# Patient Record
Sex: Female | Born: 1955
Health system: Southern US, Community
[De-identification: ages and names within clinical notes are randomized; demographics above are authoritative.]

## PROBLEM LIST (undated history)

## (undated) DIAGNOSIS — F419 Anxiety disorder, unspecified: Secondary | ICD-10-CM

## (undated) DIAGNOSIS — D649 Anemia, unspecified: Secondary | ICD-10-CM

## (undated) DIAGNOSIS — M1990 Unspecified osteoarthritis, unspecified site: Secondary | ICD-10-CM

## (undated) DIAGNOSIS — F32A Depression, unspecified: Secondary | ICD-10-CM

## (undated) DIAGNOSIS — I10 Essential (primary) hypertension: Secondary | ICD-10-CM

## (undated) DIAGNOSIS — E785 Hyperlipidemia, unspecified: Secondary | ICD-10-CM

## (undated) DIAGNOSIS — F329 Major depressive disorder, single episode, unspecified: Secondary | ICD-10-CM

## (undated) DIAGNOSIS — K635 Polyp of colon: Secondary | ICD-10-CM

## (undated) DIAGNOSIS — E119 Type 2 diabetes mellitus without complications: Secondary | ICD-10-CM

## (undated) HISTORY — DX: Hyperlipidemia, unspecified: E78.5

## (undated) HISTORY — DX: Unspecified osteoarthritis, unspecified site: M19.90

## (undated) HISTORY — DX: Major depressive disorder, single episode, unspecified: F32.9

## (undated) HISTORY — PX: WISDOM TOOTH EXTRACTION: SHX21

## (undated) HISTORY — PX: AUGMENTATION MAMMAPLASTY: SUR837

## (undated) HISTORY — DX: Depression, unspecified: F32.A

## (undated) HISTORY — PX: ABDOMINAL HYSTERECTOMY: SHX81

## (undated) HISTORY — DX: Type 2 diabetes mellitus without complications: E11.9

## (undated) HISTORY — DX: Anxiety disorder, unspecified: F41.9

---
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
## 1998-12-16 ENCOUNTER — Other Ambulatory Visit: Admission: RE | Admit: 1998-12-16 | Discharge: 1998-12-16 | Payer: Self-pay | Admitting: Gynecology

## 1999-04-15 ENCOUNTER — Ambulatory Visit (HOSPITAL_COMMUNITY): Admission: RE | Admit: 1999-04-15 | Discharge: 1999-04-15 | Payer: Self-pay | Admitting: Family Medicine

## 1999-04-26 ENCOUNTER — Ambulatory Visit (HOSPITAL_COMMUNITY): Admission: RE | Admit: 1999-04-26 | Discharge: 1999-04-26 | Payer: Self-pay | Admitting: Gastroenterology

## 1999-05-20 ENCOUNTER — Encounter: Payer: Self-pay | Admitting: Gynecology

## 1999-05-20 ENCOUNTER — Encounter (INDEPENDENT_AMBULATORY_CARE_PROVIDER_SITE_OTHER): Payer: Self-pay

## 1999-05-20 ENCOUNTER — Ambulatory Visit (HOSPITAL_COMMUNITY): Admission: RE | Admit: 1999-05-20 | Discharge: 1999-05-20 | Payer: Self-pay | Admitting: Gynecology

## 1999-12-27 ENCOUNTER — Emergency Department (HOSPITAL_COMMUNITY): Admission: EM | Admit: 1999-12-27 | Discharge: 1999-12-27 | Payer: Self-pay | Admitting: *Deleted

## 2000-01-04 ENCOUNTER — Other Ambulatory Visit: Admission: RE | Admit: 2000-01-04 | Discharge: 2000-01-04 | Payer: Self-pay | Admitting: Gynecology

## 2001-02-14 ENCOUNTER — Other Ambulatory Visit: Admission: RE | Admit: 2001-02-14 | Discharge: 2001-02-14 | Payer: Self-pay | Admitting: Gynecology

## 2001-03-01 ENCOUNTER — Encounter: Admission: RE | Admit: 2001-03-01 | Discharge: 2001-03-01 | Payer: Self-pay | Admitting: Gynecology

## 2001-03-01 ENCOUNTER — Encounter: Payer: Self-pay | Admitting: Gynecology

## 2001-11-22 ENCOUNTER — Encounter: Payer: Self-pay | Admitting: Internal Medicine

## 2001-11-22 ENCOUNTER — Encounter: Admission: RE | Admit: 2001-11-22 | Discharge: 2001-11-22 | Payer: Self-pay | Admitting: Internal Medicine

## 2002-09-27 ENCOUNTER — Encounter: Payer: Self-pay | Admitting: Internal Medicine

## 2002-09-27 ENCOUNTER — Ambulatory Visit (HOSPITAL_COMMUNITY): Admission: RE | Admit: 2002-09-27 | Discharge: 2002-09-27 | Payer: Self-pay | Admitting: Internal Medicine

## 2004-02-27 ENCOUNTER — Encounter: Admission: RE | Admit: 2004-02-27 | Discharge: 2004-02-27 | Payer: Self-pay | Admitting: Gynecology

## 2004-09-17 ENCOUNTER — Ambulatory Visit: Payer: Self-pay | Admitting: Internal Medicine

## 2004-09-24 ENCOUNTER — Ambulatory Visit: Payer: Self-pay | Admitting: Internal Medicine

## 2006-04-21 ENCOUNTER — Ambulatory Visit: Payer: Self-pay | Admitting: *Deleted

## 2006-04-25 ENCOUNTER — Ambulatory Visit: Payer: Self-pay | Admitting: Nurse Practitioner

## 2006-05-17 ENCOUNTER — Other Ambulatory Visit: Admission: RE | Admit: 2006-05-17 | Discharge: 2006-05-17 | Payer: Self-pay | Admitting: Nurse Practitioner

## 2006-05-17 ENCOUNTER — Ambulatory Visit: Payer: Self-pay | Admitting: Nurse Practitioner

## 2006-05-17 ENCOUNTER — Encounter (INDEPENDENT_AMBULATORY_CARE_PROVIDER_SITE_OTHER): Payer: Self-pay | Admitting: *Deleted

## 2006-05-25 ENCOUNTER — Ambulatory Visit: Payer: Self-pay | Admitting: Nurse Practitioner

## 2006-05-29 ENCOUNTER — Ambulatory Visit (HOSPITAL_COMMUNITY): Admission: RE | Admit: 2006-05-29 | Discharge: 2006-05-29 | Payer: Self-pay | Admitting: Family Medicine

## 2006-09-21 ENCOUNTER — Ambulatory Visit: Payer: Self-pay | Admitting: Nurse Practitioner

## 2007-02-16 ENCOUNTER — Ambulatory Visit: Payer: Self-pay | Admitting: Nurse Practitioner

## 2007-02-28 ENCOUNTER — Ambulatory Visit (HOSPITAL_COMMUNITY): Admission: RE | Admit: 2007-02-28 | Discharge: 2007-02-28 | Payer: Self-pay | Admitting: Family Medicine

## 2007-02-28 ENCOUNTER — Ambulatory Visit: Payer: Self-pay | Admitting: Vascular Surgery

## 2007-02-28 ENCOUNTER — Encounter: Payer: Self-pay | Admitting: Vascular Surgery

## 2007-07-11 ENCOUNTER — Encounter (INDEPENDENT_AMBULATORY_CARE_PROVIDER_SITE_OTHER): Payer: Self-pay | Admitting: *Deleted

## 2007-07-26 ENCOUNTER — Encounter (INDEPENDENT_AMBULATORY_CARE_PROVIDER_SITE_OTHER): Payer: Self-pay | Admitting: Nurse Practitioner

## 2007-07-26 ENCOUNTER — Ambulatory Visit: Payer: Self-pay | Admitting: Family Medicine

## 2007-07-26 LAB — CONVERTED CEMR LAB
Albumin: 4.4 g/dL (ref 3.5–5.2)
Alkaline Phosphatase: 47 units/L (ref 39–117)
CO2: 23 meq/L (ref 19–32)
Calcium: 9.3 mg/dL (ref 8.4–10.5)
Chloride: 100 meq/L (ref 96–112)
Eosinophils Absolute: 0.2 10*3/uL (ref 0.0–0.7)
Glucose, Bld: 208 mg/dL — ABNORMAL HIGH (ref 70–99)
Lymphocytes Relative: 44 % (ref 12–46)
Lymphs Abs: 3.4 10*3/uL — ABNORMAL HIGH (ref 0.7–3.3)
Monocytes Relative: 5 % (ref 3–11)
Neutro Abs: 3.7 10*3/uL (ref 1.7–7.7)
Neutrophils Relative %: 48 % (ref 43–77)
Platelets: 251 10*3/uL (ref 150–400)
Potassium: 4.7 meq/L (ref 3.5–5.3)
RBC: 4.42 M/uL (ref 3.87–5.11)
Sodium: 138 meq/L (ref 135–145)
Total Protein: 7.3 g/dL (ref 6.0–8.3)
WBC: 7.7 10*3/uL (ref 4.0–10.5)

## 2007-07-31 ENCOUNTER — Ambulatory Visit (HOSPITAL_COMMUNITY): Admission: RE | Admit: 2007-07-31 | Discharge: 2007-07-31 | Payer: Self-pay | Admitting: Nurse Practitioner

## 2007-08-02 ENCOUNTER — Ambulatory Visit: Payer: Self-pay | Admitting: Internal Medicine

## 2007-08-14 ENCOUNTER — Ambulatory Visit: Payer: Self-pay | Admitting: Internal Medicine

## 2007-10-10 ENCOUNTER — Ambulatory Visit (HOSPITAL_COMMUNITY): Admission: RE | Admit: 2007-10-10 | Discharge: 2007-10-10 | Payer: Self-pay | Admitting: Family Medicine

## 2007-11-29 ENCOUNTER — Ambulatory Visit: Payer: Self-pay | Admitting: Family Medicine

## 2008-01-08 ENCOUNTER — Encounter (INDEPENDENT_AMBULATORY_CARE_PROVIDER_SITE_OTHER): Payer: Self-pay | Admitting: Nurse Practitioner

## 2008-01-08 ENCOUNTER — Ambulatory Visit: Payer: Self-pay | Admitting: Internal Medicine

## 2008-01-08 LAB — CONVERTED CEMR LAB
AST: 13 units/L (ref 0–37)
Alkaline Phosphatase: 41 units/L (ref 39–117)
Glucose, Bld: 114 mg/dL — ABNORMAL HIGH (ref 70–99)
HDL: 38 mg/dL — ABNORMAL LOW (ref >39)
LDL Cholesterol: 158 mg/dL — ABNORMAL HIGH (ref 0–99)
Sodium: 135 meq/L (ref 135–145)
Total Bilirubin: 0.6 mg/dL (ref 0.3–1.2)
Total Protein: 7.8 g/dL (ref 6.0–8.3)
Triglycerides: 151 mg/dL — ABNORMAL HIGH (ref <150)
VLDL: 30 mg/dL (ref 0–40)

## 2008-02-06 ENCOUNTER — Ambulatory Visit: Payer: Self-pay | Admitting: Internal Medicine

## 2008-04-09 ENCOUNTER — Ambulatory Visit (HOSPITAL_COMMUNITY): Admission: RE | Admit: 2008-04-09 | Discharge: 2008-04-09 | Payer: Self-pay | Admitting: Family Medicine

## 2008-11-05 ENCOUNTER — Ambulatory Visit: Payer: Self-pay | Admitting: Emergency Medicine

## 2008-11-05 DIAGNOSIS — R0609 Other forms of dyspnea: Secondary | ICD-10-CM

## 2008-11-05 DIAGNOSIS — I10 Essential (primary) hypertension: Secondary | ICD-10-CM

## 2008-11-05 DIAGNOSIS — R0989 Other specified symptoms and signs involving the circulatory and respiratory systems: Secondary | ICD-10-CM

## 2008-12-03 ENCOUNTER — Ambulatory Visit: Payer: Self-pay | Admitting: Emergency Medicine

## 2009-08-19 ENCOUNTER — Encounter (INDEPENDENT_AMBULATORY_CARE_PROVIDER_SITE_OTHER): Payer: Self-pay | Admitting: *Deleted

## 2010-11-13 ENCOUNTER — Encounter: Payer: Self-pay | Admitting: Gynecology

## 2011-11-22 ENCOUNTER — Encounter: Payer: Self-pay | Admitting: General Practice

## 2012-01-11 DIAGNOSIS — R1013 Epigastric pain: Secondary | ICD-10-CM | POA: Insufficient documentation

## 2012-01-11 DIAGNOSIS — R112 Nausea with vomiting, unspecified: Secondary | ICD-10-CM | POA: Insufficient documentation

## 2012-01-11 DIAGNOSIS — I10 Essential (primary) hypertension: Secondary | ICD-10-CM | POA: Insufficient documentation

## 2012-01-12 ENCOUNTER — Other Ambulatory Visit: Payer: Self-pay

## 2012-01-12 ENCOUNTER — Emergency Department (HOSPITAL_COMMUNITY)
Admission: EM | Admit: 2012-01-12 | Discharge: 2012-01-12 | Disposition: A | Discharge status: Home / Self Care | Payer: Self-pay | Attending: Emergency Medicine | Admitting: Emergency Medicine

## 2012-01-12 ENCOUNTER — Emergency Department (HOSPITAL_COMMUNITY): Payer: Self-pay

## 2012-01-12 ENCOUNTER — Encounter (HOSPITAL_COMMUNITY): Payer: Self-pay | Admitting: Adult Health

## 2012-01-12 HISTORY — DX: Essential (primary) hypertension: I10

## 2012-01-12 LAB — HEPATIC FUNCTION PANEL
ALT: 18 U/L (ref 0–35)
AST: 17 U/L (ref 0–37)
Total Protein: 7.1 g/dL (ref 6.0–8.3)

## 2012-01-12 LAB — CBC
MCHC: 33 g/dL (ref 30.0–36.0)
RDW: 12.8 % (ref 11.5–15.5)

## 2012-01-12 LAB — BASIC METABOLIC PANEL
Chloride: 102 mEq/L (ref 96–112)
GFR calc Af Amer: >90 mL/min (ref >90)
Potassium: 4 mEq/L (ref 3.5–5.1)
Sodium: 139 mEq/L (ref 135–145)

## 2012-01-12 LAB — DIFFERENTIAL
Basophils Absolute: 0 10*3/uL (ref 0.0–0.1)
Basophils Relative: 0 % (ref 0–1)
Neutro Abs: 10.1 10*3/uL — ABNORMAL HIGH (ref 1.7–7.7)
Neutrophils Relative %: 82 % — ABNORMAL HIGH (ref 43–77)

## 2012-01-12 MED ORDER — SODIUM CHLORIDE 0.9 % IV SOLN
Freq: Once | INTRAVENOUS | Status: AC
  Administered 2012-01-12: 04:00:00 via INTRAVENOUS

## 2012-01-12 MED ORDER — ONDANSETRON 8 MG PO TBDP: ORAL_TABLET | ORAL | Status: AC

## 2012-01-12 MED ORDER — MORPHINE SULFATE 4 MG/ML IJ SOLN
4.0000 mg | Freq: Once | INTRAMUSCULAR | Status: AC
  Administered 2012-01-12: 4 mg via INTRAVENOUS
  Filled 2012-01-12: qty 1

## 2012-01-12 MED ORDER — ONDANSETRON HCL 4 MG/2ML IJ SOLN
4.0000 mg | Freq: Once | INTRAMUSCULAR | Status: AC
  Administered 2012-01-12: 4 mg via INTRAVENOUS
  Filled 2012-01-12: qty 2

## 2012-01-12 MED ORDER — HYDROCODONE-ACETAMINOPHEN 5-500 MG PO TABS: 1.0000 | ORAL_TABLET | Freq: Four times a day (QID) | ORAL | Status: AC | PRN

## 2012-01-12 NOTE — ED Notes (Signed)
Patient transported to Ultrasound 

## 2012-01-12 NOTE — ED Provider Notes (Signed)
History     CSN: [id number]  Arrival date & time 01/11/12  2357   First MD Initiated Contact with Patient 01/12/12 0335      Chief Complaint  Patient presents with  . Abdominal Pain    (Consider location/radiation/quality/duration/timing/severity/associated sxs/prior treatment) HPI Comments: Had pancakes and bacon for dinner, shortly thereafter she developed pain in the epigastrium which has worsened.  She has also vomited several times.    Patient is a 56 y.o. female presenting with abdominal pain. The history is provided by the patient.  Abdominal Pain The primary symptoms of the illness include abdominal pain, nausea and vomiting. The primary symptoms of the illness do not include fever, fatigue or diarrhea. The current episode started 3 to 5 hours ago. The problem has been gradually worsening.  The patient states that she believes she is currently not pregnant. The patient has not had a change in bowel habit.    Past Medical History  Diagnosis Date  . Hypertension     History reviewed. No pertinent past surgical history.  History reviewed. No pertinent family history.  History  Substance Use Topics  . Smoking status: Never Smoker   . Smokeless tobacco: Not on file  . Alcohol Use: No    OB History    Grav Para Term Preterm Abortions TAB SAB Ect Mult Living                  Review of Systems  Constitutional: Negative for fever and fatigue.  Gastrointestinal: Positive for nausea, vomiting and abdominal pain. Negative for diarrhea.  All other systems reviewed and are negative.    Allergies  Review of patient's allergies indicates no known allergies.  Home Medications   Current Outpatient Rx  Name Route Sig Dispense Refill  . LISINOPRIL 10 MG PO TABS Oral Take 10 mg by mouth daily.      BP 111/94  Pulse 94  Temp(Src) 98.4 F (36.9 C) (Oral)  Resp 16  SpO2 93%  Physical Exam  Nursing note and vitals reviewed. Constitutional: She is oriented to  person, place, and time. She appears well-developed and well-nourished.  HENT:  Head: Normocephalic and atraumatic.  Neck: Normal range of motion. Neck supple.  Cardiovascular: Normal rate and regular rhythm.   No murmur heard. Pulmonary/Chest: Effort normal and breath sounds normal. No respiratory distress.  Abdominal: Soft. Bowel sounds are normal. She exhibits no distension. There is tenderness. There is no rebound.       There is ttp in the epigastric area.  There is no rebound or guarding.  Bowel sounds are normoactive.  Musculoskeletal: Normal range of motion. She exhibits no edema.  Neurological: She is alert and oriented to person, place, and time.  Skin: Skin is warm and dry. She is not diaphoretic.    ED Course  Procedures (including critical care time)  Labs Reviewed  CBC - Abnormal; Notable for the following:    WBC 12.4 (*)    All other components within normal limits  DIFFERENTIAL - Abnormal; Notable for the following:    Neutrophils Relative 82 (*)    Neutro Abs 10.1 (*)    All other components within normal limits  BASIC METABOLIC PANEL - Abnormal; Notable for the following:    Glucose, Bld 152 (*)    All other components within normal limits   No results found.   No diagnosis found.    MDM  The labs show an elevated wbc of 12k.  Otherwise the labs  look okay.  The [place] does not reveal a cause of her symptoms.  She is feeling better with the meds given.  She will be discharged to home, to return prn if she worsens.        Geoffery Lyons, MD 01/12/12 [phone]

## 2012-01-12 NOTE — ED Notes (Addendum)
C/o epigastric abdominal pain feels like a tightness and associated with nausea began at 2030 this evening

## 2012-01-12 NOTE — Discharge Instructions (Signed)

## 2012-01-12 NOTE — ED Notes (Signed)
Pt back from [place]

## 2012-07-12 ENCOUNTER — Encounter: Payer: Self-pay | Admitting: Internal Medicine

## 2012-10-24 LAB — HM COLONOSCOPY

## 2013-06-12 ENCOUNTER — Other Ambulatory Visit (HOSPITAL_COMMUNITY): Payer: Self-pay | Admitting: Family Medicine

## 2013-06-12 DIAGNOSIS — Z1231 Encounter for screening mammogram for malignant neoplasm of breast: Secondary | ICD-10-CM

## 2013-06-17 ENCOUNTER — Ambulatory Visit (HOSPITAL_COMMUNITY)
Admission: RE | Admit: 2013-06-17 | Discharge: 2013-06-17 | Disposition: A | Discharge status: Home / Self Care | Payer: No Typology Code available for payment source | Source: Ambulatory Visit | Attending: Family Medicine | Admitting: Family Medicine

## 2013-06-17 DIAGNOSIS — Z1231 Encounter for screening mammogram for malignant neoplasm of breast: Secondary | ICD-10-CM | POA: Insufficient documentation

## 2015-03-10 ENCOUNTER — Encounter: Payer: 59 | Attending: Family Medicine

## 2015-03-10 VITALS — Ht 63.0 in | Wt 202.4 lb

## 2015-03-10 DIAGNOSIS — IMO0002 Reserved for concepts with insufficient information to code with codable children: Secondary | ICD-10-CM

## 2015-03-10 DIAGNOSIS — Z713 Dietary counseling and surveillance: Secondary | ICD-10-CM | POA: Insufficient documentation

## 2015-03-10 DIAGNOSIS — E118 Type 2 diabetes mellitus with unspecified complications: Secondary | ICD-10-CM | POA: Insufficient documentation

## 2015-03-10 DIAGNOSIS — E1165 Type 2 diabetes mellitus with hyperglycemia: Secondary | ICD-10-CM

## 2015-03-10 NOTE — Progress Notes (Signed)

## 2015-03-17 DIAGNOSIS — E118 Type 2 diabetes mellitus with unspecified complications: Secondary | ICD-10-CM | POA: Diagnosis not present

## 2015-03-17 DIAGNOSIS — E1165 Type 2 diabetes mellitus with hyperglycemia: Secondary | ICD-10-CM

## 2015-03-17 DIAGNOSIS — IMO0002 Reserved for concepts with insufficient information to code with codable children: Secondary | ICD-10-CM

## 2015-03-17 NOTE — Progress Notes (Signed)

## 2015-03-24 DIAGNOSIS — E118 Type 2 diabetes mellitus with unspecified complications: Principal | ICD-10-CM

## 2015-03-24 DIAGNOSIS — E1165 Type 2 diabetes mellitus with hyperglycemia: Secondary | ICD-10-CM

## 2015-03-24 DIAGNOSIS — IMO0002 Reserved for concepts with insufficient information to code with codable children: Secondary | ICD-10-CM

## 2015-03-24 NOTE — Progress Notes (Signed)
Patient was seen on 03/24/15 for the third of a series of three diabetes self-management courses at the [facility].   Catalina Gravel the amount of activity recommended for healthy living . Describe activities suitable for individual needs . Identify ways to regularly incorporate activity into daily life . Identify barriers to activity and ways to over come these barriers  Identify diabetes medications being personally used and their primary action for lowering glucose and possible side effects . Describe role of stress on blood glucose and develop strategies to address psychosocial issues . Identify diabetes complications and ways to prevent them  Explain how to manage diabetes during illness . Evaluate success in meeting personal goal . Establish 2-3 goals that they will plan to diligently work on until they return for the  44-month follow-up visit  Goals:   I will count my carb choices at most meals and snacks  I will be active  5 times a week  Your patient has identified these potential barriers to change:  Motivation  Your patient has identified their diabetes self-care support plan as  Family Support Plan:  Attend Core 4 in 4 months

## 2015-07-22 ENCOUNTER — Ambulatory Visit: Payer: No Typology Code available for payment source

## 2015-10-22 DIAGNOSIS — Z789 Other specified health status: Secondary | ICD-10-CM | POA: Insufficient documentation

## 2015-10-22 DIAGNOSIS — M170 Bilateral primary osteoarthritis of knee: Secondary | ICD-10-CM | POA: Insufficient documentation

## 2015-10-22 DIAGNOSIS — E1169 Type 2 diabetes mellitus with other specified complication: Secondary | ICD-10-CM | POA: Insufficient documentation

## 2015-10-22 DIAGNOSIS — E782 Mixed hyperlipidemia: Secondary | ICD-10-CM

## 2015-11-23 DIAGNOSIS — E559 Vitamin D deficiency, unspecified: Secondary | ICD-10-CM | POA: Insufficient documentation

## 2016-04-06 ENCOUNTER — Other Ambulatory Visit: Payer: Self-pay | Admitting: Family Medicine

## 2016-04-06 DIAGNOSIS — Z1231 Encounter for screening mammogram for malignant neoplasm of breast: Secondary | ICD-10-CM

## 2016-04-21 ENCOUNTER — Ambulatory Visit: Payer: No Typology Code available for payment source

## 2016-04-25 ENCOUNTER — Ambulatory Visit: Payer: No Typology Code available for payment source

## 2017-03-03 DIAGNOSIS — R600 Localized edema: Secondary | ICD-10-CM | POA: Insufficient documentation

## 2017-03-13 LAB — HM MAMMOGRAPHY

## 2017-09-29 LAB — HEMOGLOBIN A1C: HEMOGLOBIN A1C: 5.8

## 2018-05-17 ENCOUNTER — Encounter: Payer: Self-pay | Admitting: Family Medicine

## 2018-05-18 ENCOUNTER — Encounter: Payer: Self-pay | Admitting: Family Medicine

## 2018-05-18 ENCOUNTER — Other Ambulatory Visit: Payer: Self-pay | Admitting: Emergency Medicine

## 2018-05-18 ENCOUNTER — Ambulatory Visit: Payer: PRIVATE HEALTH INSURANCE | Admitting: Family Medicine

## 2018-05-18 VITALS — BP 122/78 | HR 85 | Temp 99.0°F | Ht 62.5 in | Wt 179.4 lb

## 2018-05-18 DIAGNOSIS — I10 Essential (primary) hypertension: Secondary | ICD-10-CM

## 2018-05-18 DIAGNOSIS — E119 Type 2 diabetes mellitus without complications: Secondary | ICD-10-CM

## 2018-05-18 DIAGNOSIS — Z789 Other specified health status: Secondary | ICD-10-CM | POA: Diagnosis not present

## 2018-05-18 DIAGNOSIS — E1169 Type 2 diabetes mellitus with other specified complication: Secondary | ICD-10-CM | POA: Diagnosis not present

## 2018-05-18 DIAGNOSIS — M791 Myalgia, unspecified site: Secondary | ICD-10-CM

## 2018-05-18 DIAGNOSIS — M170 Bilateral primary osteoarthritis of knee: Secondary | ICD-10-CM

## 2018-05-18 DIAGNOSIS — E782 Mixed hyperlipidemia: Secondary | ICD-10-CM | POA: Diagnosis not present

## 2018-05-18 DIAGNOSIS — Z23 Encounter for immunization: Secondary | ICD-10-CM

## 2018-05-18 LAB — CBC WITH DIFFERENTIAL/PLATELET
BASOS PCT: 0.9 % (ref 0.0–3.0)
Basophils Absolute: 0.1 10*3/uL (ref 0.0–0.1)
EOS PCT: 3.1 % (ref 0.0–5.0)
Eosinophils Absolute: 0.2 10*3/uL (ref 0.0–0.7)
HEMATOCRIT: 40.5 % (ref 36.0–46.0)
HEMOGLOBIN: 13.5 g/dL (ref 12.0–15.0)
Lymphocytes Relative: 35.2 % (ref 12.0–46.0)
Lymphs Abs: 2.4 10*3/uL (ref 0.7–4.0)
MCHC: 33.4 g/dL (ref 30.0–36.0)
MCV: 88.5 fl (ref 78.0–100.0)
MONOS PCT: 5.4 % (ref 3.0–12.0)
Monocytes Absolute: 0.4 10*3/uL (ref 0.1–1.0)
Neutro Abs: 3.7 10*3/uL (ref 1.4–7.7)
Neutrophils Relative %: 55.4 % (ref 43.0–77.0)
Platelets: 275 10*3/uL (ref 150.0–400.0)
RBC: 4.58 Mil/uL (ref 3.87–5.11)
RDW: 13.7 % (ref 11.5–15.5)
WBC: 6.8 10*3/uL (ref 4.0–10.5)

## 2018-05-18 LAB — COMPREHENSIVE METABOLIC PANEL
ALT: 12 U/L (ref 0–35)
AST: 13 U/L (ref 0–37)
Albumin: 4.2 g/dL (ref 3.5–5.2)
Alkaline Phosphatase: 42 U/L (ref 39–117)
BILIRUBIN TOTAL: 0.5 mg/dL (ref 0.2–1.2)
BUN: 11 mg/dL (ref 6–23)
CALCIUM: 9.4 mg/dL (ref 8.4–10.5)
CHLORIDE: 101 meq/L (ref 96–112)
CO2: 28 meq/L (ref 19–32)
CREATININE: 0.67 mg/dL (ref 0.40–1.20)
GFR: 94.11 mL/min (ref >60.00)
Glucose, Bld: 117 mg/dL — ABNORMAL HIGH (ref 70–99)
Potassium: 4.5 mEq/L (ref 3.5–5.1)
SODIUM: 138 meq/L (ref 135–145)
Total Protein: 7.6 g/dL (ref 6.0–8.3)

## 2018-05-18 LAB — POCT GLYCOSYLATED HEMOGLOBIN (HGB A1C): HEMOGLOBIN A1C: 6 % — AB (ref 4.0–5.6)

## 2018-05-18 LAB — LIPID PANEL
CHOLESTEROL: 184 mg/dL (ref 0–200)
HDL: 46.3 mg/dL (ref >39.00)
LDL CALC: 98 mg/dL (ref 0–99)
NONHDL: 137.71
Total CHOL/HDL Ratio: 4
Triglycerides: 199 mg/dL — ABNORMAL HIGH (ref 0.0–149.0)
VLDL: 39.8 mg/dL (ref 0.0–40.0)

## 2018-05-18 LAB — MICROALBUMIN / CREATININE URINE RATIO
CREATININE, U: 57.3 mg/dL
Microalb Creat Ratio: 1.2 mg/g (ref 0.0–30.0)
Microalb, Ur: <0.7 mg/dL (ref 0.0–1.9)

## 2018-05-18 LAB — TSH: TSH: 3.02 u[IU]/mL (ref 0.35–4.50)

## 2018-05-18 LAB — CK: Total CK: 58 U/L (ref 7–177)

## 2018-05-18 MED ORDER — DICLOFENAC SODIUM 75 MG PO TBEC: 75.0000 mg | DELAYED_RELEASE_TABLET | Freq: Two times a day (BID) | ORAL | 2 refills | Status: DC | PRN

## 2018-05-18 NOTE — Progress Notes (Signed)
Subjective  CC:  Chief Complaint  Patient presents with  . Establish Care    Transfer from [place],   . Hyperlipidemia  . Hypertension  . Diabetes    Last A1C 09/29/2017  . Leg Pain    Contstant Leg Pain     HPI: Sarah Walsh is a 62 y.o. female is a former [place] patient and is here to reestablish care with me today.    She has the following concerns or needs:  Hasn't had medical care regularly for about a year.   Has h/o diabetes that resolved with lifestyle changes; now with an occ sugar > 200. Some polyuria. No vision problems. Admits to mild feet paresthesias that are not terrible bothersome.   Has chronic bilateral leg muscle aches and knee pain. Poor insight. Has DJD bilateral knees by exam and xrays 02/2018. C/o ant knee pain after prolonged sitting. Has h/o mild leg swelling. Nl hip xrays. No injuries. Pain is variable. Comes and goes. Was improved on voltaren but she stopped this. Has never used tylenol, glucosamine. Never has had steroid or synvisc injections.   HLD statin intolerant on zetia. Fasting today.   BP: no longer on ACE or HCTZ. No cp or sob.   Assessment  1. Diet-controlled diabetes mellitus ([place])   2. Essential hypertension   3. Combined hyperlipidemia associated with type 2 diabetes mellitus (H[facility])   4. Statin intolerance   5. Myalgia   6. Primary osteoarthritis of both knees   7. Need for shingles vaccine      Plan   DM: diet controlled now. Reassured. Discussed diet. imms up to date. Eye exam up to date: [facility]. Denies retinopathy. Monitor for symptomatic neuropathy. Nl foot exam today. Check urina mac today  BP - normal today w/o meds. Will monitor  HLD on zetia; recheck levels today. Check lfts  Myalgias/OA: suspect most pain is due to knee pain; educated. Start PFS VMO exercises; restart voltaren as needed .trial of glucosamine and tyelnol. Consider injections if can't get controlled.   Follow up:  Return in about 6 weeks (around  06/29/2018) for recheck.  Orders Placed This Encounter  Procedures  . HM MAMMOGRAPHY  . Varicella-zoster vaccine IM (Shingrix)  . Hemoglobin A1c  . Microalbumin / creatinine urine ratio  . Comprehensive metabolic panel  . Lipid panel  . CBC with Differential/Platelet  . TSH  . CK (Creatine Kinase)  . POCT glycosylated hemoglobin (Hb A1C)  . HM COLONOSCOPY   Meds ordered this encounter  Medications  . diclofenac (VOLTAREN) 75 MG EC tablet    Sig: Take 1 tablet (75 mg total) by mouth 2 (two) times daily as needed.    Dispense:  60 tablet    Refill:  2      We updated and reviewed the patient's past history in detail and it is documented below.  Patient Active Problem List   Diagnosis Date Noted  . Diet-controlled diabetes mellitus ([place]) 05/18/2018  . Lower extremity edema 03/03/2017  . Vitamin D deficiency 11/23/2015  . Combined hyperlipidemia associated with type 2 diabetes mellitus ([place]) 10/22/2015  . Primary osteoarthritis of both knees 10/22/2015  . Statin intolerance 10/22/2015  . Essential hypertension 11/05/2008    Qualifier: Diagnosis of  By: Wynetta Emery RN, [facility] Maintenance  Topic Date Due  . Hepatitis C Screening  31-Mar-1956  . FOOT EXAM  02/16/1964  . OPHTHALMOLOGY EXAM  02/16/1964  . URINE MICROALBUMIN  02/16/1964  .  HIV Screening  02/15/1969  . COLONOSCOPY  10/24/2017  . MAMMOGRAM  03/13/2018  . HEMOGLOBIN A1C  03/30/2018  . INFLUENZA VACCINE  05/24/2018  . TETANUS/TDAP  11/22/2020  . PAP SMEAR  Discontinued   Immunization History  Administered Date(s) Administered  . Influenza, Seasonal, Injecte, Preservative Fre 10/22/2015, 07/15/2016  . Influenza,inj,Quad PF,6+ Mos 09/29/2017  . PPD Test 09/21/2016  . Pneumococcal Polysaccharide-23 10/22/2015  . Tdap 11/22/2010  . Zoster 11/23/2015  . Zoster Recombinat (Shingrix) 05/18/2018   Current Meds  Medication Sig  . Ascorbic Acid (VITAMIN C) 1000 MG tablet Take 1,000 mg by mouth daily.  .  Biotin 1000 MCG tablet Take 1,000 mcg by mouth 3 (three) times daily.  Renard Hamper Pepper-Turmeric (TURMERIC CURCUMIN) 02-999 MG CAPS Take by mouth.  . Cholecalciferol (D3-1000) 1000 units tablet Take 1,000 Units by mouth daily.  Marland Kitchen ezetimibe (ZETIA) 10 MG tablet ezetimibe 10 mg tablet  TAKE 1 TABLET EVERY DAY  . magnesium oxide (MAG-OX) 400 MG tablet Take 400 mg by mouth daily.  . Melatonin 5 MG CAPS Take by mouth.  . OMEGA-3 KRILL OIL PO Take by mouth.  . Potassium 99 MG TABS Take by mouth.  . vitamin B-12 (CYANOCOBALAMIN) 500 MCG tablet Take 500 mcg by mouth daily.  . [DISCONTINUED] hydrochlorothiazide (HYDRODIURIL) 25 MG tablet Take by mouth.    Allergies: Patient is allergic to statins. Past Medical History Patient  has a past medical history of Anxiety, Depression, Diabetes mellitus without complication ([place]), Hyperlipemia, and Hypertension. Past Surgical History Patient  has a past surgical history that includes Cesarean section and Abdominal hysterectomy. Family History: Patient family history includes Alzheimer's disease in her mother; Diabetes in her daughter. Social History:  Patient  reports that she has quit smoking. She quit after 34.00 years of use. She has never used smokeless tobacco. She reports that she does not drink alcohol or use drugs.  Review of Systems: Constitutional: negative for fever or malaise Ophthalmic: negative for photophobia, double vision or loss of vision Cardiovascular: negative for chest pain, dyspnea on exertion, or new LE swelling Respiratory: negative for SOB or persistent cough Gastrointestinal: negative for abdominal pain, change in bowel habits or melena Genitourinary: negative for dysuria or gross hematuria Musculoskeletal: negative for new gait disturbance or muscular weakness Integumentary: negative for new or persistent rashes Neurological: negative for TIA or stroke symptoms Psychiatric: negative for SI or delusions Allergic/Immunologic:  negative for hives  Patient Care Team    Relationship Specialty Notifications Start End  Leamon Arnt, MD PCP - General Family Medicine  05/18/18     Objective  Vitals: BP 122/78   Pulse 85   Temp 99 F (37.2 C)   Ht 5' 2.5" (1.588 m)   Wt 179 lb 6.4 oz (81.4 kg)   SpO2 98%   BMI 32.29 kg/m  General:  Well developed, well nourished, no acute distress  Psych:  Alert and oriented,normal mood and affect  Cardiovascular:  RRR without gallop, rub or murmur, nondisplaced PMI Respiratory:  Good breath sounds bilaterally, CTAB with normal respiratory effort UKG:[id number] knees with crepitus. No effusions. Mild ttp Diabetic Foot Exam: Appearance - no lesions, ulcers or calluses Skin - no sigificant pallor or erythema Monofilament testing - sensitive bilaterally in following locations:  Right - Great toe, medial, central, lateral ball and posterior foot intact  Left - Great toe, medial, central, lateral ball and posterior foot intact Pulses - +2 distally bilaterally  Skin:  Warm, no rashes or suspicious  lesions noted Neurologic:    Mental status is normal. Gross motor and sensory exams are normal. Normal gait  Lab Results  Component Value Date   HGBA1C 6.0 (A) 05/18/2018   HGBA1C 5.8 09/29/2017      Commons side effects, risks, benefits, and alternatives for medications and treatment plan prescribed today were discussed, and the patient expressed understanding of the given instructions. Patient is instructed to call or message via MyChart if he/she has any questions or concerns regarding our treatment plan. No barriers to understanding were identified. We discussed Red Flag symptoms and signs in detail. Patient expressed understanding regarding what to do in case of urgent or emergency type symptoms.   Medication list was reconciled, printed and provided to the patient in AVS. Patient instructions and summary information was reviewed with the patient as documented in the AVS. This  note was prepared with assistance of Dragon voice recognition software. Occasional wrong-word or sound-a-like substitutions may have occurred due to the inherent limitations of voice recognition software

## 2018-05-18 NOTE — Patient Instructions (Signed)
Please return in 6 weeks to recheck knee and leg pain  If you have any questions or concerns, please don't hesitate to send me a message via MyChart or call the office at [phone]. Thank you for visiting with [place] today! It's our pleasure caring for you.   Patellofemoral Pain Syndrome Patellofemoral pain syndrome is a condition that involves a softening or breakdown of the tissue (cartilage) on the underside of your kneecap (patella). This causes pain in the front of the knee. The condition is also called runner's knee or chondromalacia patella. Patellofemoral pain syndrome is most common in young adults who are active in sports. Your knee is the largest joint in your body. The patella covers the front of your knee and is attached to muscles above and below your knee. The underside of the patella is covered with a smooth type of cartilage (synovium). The smooth surface helps the patella glide easily when you move your knee. Patellofemoral pain syndrome causes swelling in the joint linings and bone surfaces in your knee. What are the causes? Patellofemoral pain syndrome can be caused by:  Overuse.  Poor alignment of your knee joints.  Weak leg muscles.  A direct blow to your kneecap.  What increases the risk? You may be at risk for patellofemoral pain syndrome if you:  Do a lot of activities that can wear down your kneecap. These include: ? Running. ? Squatting. ? Climbing stairs.  Start a new physical activity or exercise program.  Wear shoes that do not fit well.  Do not have good leg strength.  Are overweight.  What are the signs or symptoms? Knee pain is the most common symptom of patellofemoral pain syndrome. This may feel like a dull, aching pain underneath your patella, in the front of your knee. There may be a popping or cracking sound when you move your knee. Pain may get worse with:  Exercise.  Climbing  stairs.  Running.  Jumping.  Squatting.  Kneeling.  Sitting for a long time.  Moving or pushing on your patella.  How is this diagnosed? Your health care provider may be able to diagnose patellofemoral pain syndrome from your symptoms and medical history. You may be asked about your recent physical activities and which ones cause knee pain. Your health care provider may do a physical exam with certain tests to confirm the diagnosis. These may include:  Moving your patella back and forth.  Checking your range of knee motion.  Having you squat or jump to see if you have pain.  Checking the strength of your leg muscles.  An MRI of the knee may also be done. How is this treated? Patellofemoral pain syndrome can usually be treated at home with rest, ice, compression, and elevation (RICE). Other treatments may include:  Nonsteroidal anti-inflammatory drugs (NSAIDs).  Physical therapy to stretch and strengthen your leg muscles.  Shoe inserts (orthotics) to take stress off your knee.  A knee brace or knee support.  Surgery to remove damaged cartilage or move the patella to a better position. The need for surgery is rare.  Follow these instructions at home:  Take medicines only as directed by your health care provider.  Rest your knee. ? When resting, keep your knee raised above the level of your heart. ? Avoid activities that cause knee pain.  Apply ice to the injured area: ? Put ice in a plastic bag. ? Place a towel between your skin and the bag. ? Leave the ice on  for 20 minutes, 2-3 times a day.  Use splints, braces, knee supports, or walking aids as directed by your health care provider.  Perform stretching and strengthening exercises as directed by your health care provider or physical therapist.  Keep all follow-up visits as directed by your health care provider. This is important. Contact a health care provider if:  Your symptoms get worse.  You are not  improving with home care. This information is not intended to replace advice given to you by your health care provider. Make sure you discuss any questions you have with your health care provider. Document Released: 09/28/2009 Document Revised: 03/17/2016 Document Reviewed: 12/30/2013 Elsevier Interactive Patient Education  2018 [employer].  Osteoarthritis Osteoarthritis is a type of arthritis that affects tissue that covers the ends of bones in joints (cartilage). Cartilage acts as a cushion between the bones and helps them move smoothly. Osteoarthritis results when cartilage in the joints gets worn down. Osteoarthritis is sometimes called "wear and tear" arthritis. Osteoarthritis is the most common form of arthritis. It often occurs in older people. It is a condition that gets worse over time (a progressive condition). Joints that are most often affected by this condition are in:  Fingers.  Toes.  Hips.  Knees.  Spine, including neck and lower back.  What are the causes? This condition is caused by age-related wearing down of cartilage that covers the ends of bones. What increases the risk? The following factors may make you more likely to develop this condition:  Older age.  Being overweight or obese.  Overuse of joints, such as in athletes.  Past injury of a joint.  Past surgery on a joint.  Family history of osteoarthritis.  What are the signs or symptoms? The main symptoms of this condition are pain, swelling, and stiffness in the joint. The joint may lose its shape over time. Small pieces of bone or cartilage may break off and float inside of the joint, which may cause more pain and damage to the joint. Small deposits of bone (osteophytes) may grow on the edges of the joint. Other symptoms may include:  A grating or scraping feeling inside the joint when you move it.  Popping or creaking sounds when you move.  Symptoms may affect one or more joints. Osteoarthritis  in a major joint, such as your knee or hip, can make it painful to walk or exercise. If you have osteoarthritis in your hands, you might not be able to grip items, twist your hand, or control small movements of your hands and fingers (fine motor skills). How is this diagnosed? This condition may be diagnosed based on:  Your medical history.  A physical exam.  Your symptoms.  X-rays of the affected joint(s).  Blood tests to rule out other types of arthritis.  How is this treated? There is no cure for this condition, but treatment can help to control pain and improve joint function. Treatment plans may include:  A prescribed exercise program that allows for rest and joint relief. You may work with a physical therapist.  A weight control plan.  Pain relief techniques, such as: ? Applying heat and cold to the joint. ? Electric pulses delivered to nerve endings under the skin (transcutaneous electrical nerve stimulation, or TENS). ? Massage. ? Certain nutritional supplements.  NSAIDs or prescription medicines to help relieve pain.  Medicine to help relieve pain and inflammation (corticosteroids). This can be given by mouth (orally) or as an injection.  Assistive devices,  such as a brace, wrap, splint, specialized glove, or cane.  Surgery, such as: ? An osteotomy. This is done to reposition the bones and relieve pain or to remove loose pieces of bone and cartilage. ? Joint replacement surgery. You may need this surgery if you have very bad (advanced) osteoarthritis.  Follow these instructions at home: Activity  Rest your affected joints as directed by your health care provider.  Do not drive or use heavy machinery while taking prescription pain medicine.  Exercise as directed. Your health care provider or physical therapist may recommend specific types of exercise, such as: ? Strengthening exercises. These are done to strengthen the muscles that support joints that are affected  by arthritis. They can be performed with weights or with exercise bands to add resistance. ? Aerobic activities. These are exercises, such as brisk walking or water aerobics, that get your heart pumping. ? Range-of-motion activities. These keep your joints easy to move. ? Balance and agility exercises. Managing pain, stiffness, and swelling  If directed, apply heat to the affected area as often as told by your health care provider. Use the heat source that your health care provider recommends, such as a moist heat pack or a heating pad. ? If you have a removable assistive device, remove it as told by your health care provider. ? Place a towel between your skin and the heat source. If your health care provider tells you to keep the assistive device on while you apply heat, place a towel between the assistive device and the heat source. ? Leave the heat on for 20-30 minutes. ? Remove the heat if your skin turns bright red. This is especially important if you are unable to feel pain, heat, or cold. You may have a greater risk of getting burned.  If directed, put ice on the affected joint: ? If you have a removable assistive device, remove it as told by your health care provider. ? Put ice in a plastic bag. ? Place a towel between your skin and the bag. If your health care provider tells you to keep the assistive device on during icing, place a towel between the assistive device and the bag. ? Leave the ice on for 20 minutes, 2-3 times a day. General instructions  Take over-the-counter and prescription medicines only as told by your health care provider.  Maintain a healthy weight. Follow instructions from your health care provider for weight control. These may include dietary restrictions.  Do not use any products that contain nicotine or tobacco, such as cigarettes and e-cigarettes. These can delay bone healing. If you need help quitting, ask your health care provider.  Use assistive devices  as directed by your health care provider.  Keep all follow-up visits as told by your health care provider. This is important. Where to find more information:  [employer] of Arthritis and Musculoskeletal and Skin Diseases: www.niams.SouthExposed.es  [employer] on Aging: http://kim-miller.com/  American College of Rheumatology: www.rheumatology.org Contact a health care provider if:  Your skin turns red.  You develop a rash.  You have pain that gets worse.  You have a fever along with joint or muscle aches. Get help right away if:  You lose a lot of weight.  You suddenly lose your appetite.  You have night sweats. Summary  Osteoarthritis is a type of arthritis that affects tissue covering the ends of bones in joints (cartilage).  This condition is caused by age-related wearing down of cartilage that covers the  ends of bones.  The main symptom of this condition is pain, swelling, and stiffness in the joint.  There is no cure for this condition, but treatment can help to control pain and improve joint function. This information is not intended to replace advice given to you by your health care provider. Make sure you discuss any questions you have with your health care provider. Document Released: 10/10/2005 Document Revised: 06/13/2016 Document Reviewed: 06/13/2016 Elsevier Interactive Patient Education  [employer].

## 2018-05-18 NOTE — Progress Notes (Signed)
Please call patient: I have reviewed his/her lab results. All lab test results look great. Cholesterol is at goal so continue zetia 10mg  daily. Can refill if needed. No other medications are needed at this time.

## 2018-05-21 ENCOUNTER — Telehealth: Payer: Self-pay | Admitting: Emergency Medicine

## 2018-05-21 NOTE — Telephone Encounter (Signed)
Copied from [place] [id number]. Topic: Quick Communication - Lab Results >> May 21, 2018  8:37 AM Sigurd Sos, LPN wrote: Called patient to inform them of 05/21/2018 lab results. When patient returns call, triage nurse may disclose results. >> May 21, 2018  9:57 AM Keene Breath wrote: Patient is returning call regarding labs.  NT was not available.  CB# [phone].   Returned patient's phone call. Reported Lab results, Patient verbalized understanding.  Doloris Hall,  LPN

## 2018-05-29 ENCOUNTER — Telehealth: Payer: Self-pay | Admitting: Family Medicine

## 2018-05-29 MED ORDER — EZETIMIBE 10 MG PO TABS: ORAL_TABLET | ORAL | 3 refills | Status: DC

## 2018-05-29 NOTE — Telephone Encounter (Signed)
Rx sent to [facility].   Doloris Hall,  LPN

## 2018-05-29 NOTE — Telephone Encounter (Signed)
Copied from [place] [id number]. Topic: Quick Communication - Rx Refill/Question >> May 29, 2018 11:25 AM Keene Breath wrote: Medication: ezetimibe (ZETIA) 10 MG tablet  Patient called to request a refill for the above medication.  CB# [phone]  Preferred Pharmacy (with phone number or street name): [facility] # [id number] [place] [phone] (Phone) [phone] (Fax)

## 2018-05-29 NOTE — Telephone Encounter (Signed)
Ezetimibe (Zetia) 10mg  tab refill Last Refilled by historical provider Last OV: 05/18/18 PCP: Dr. Jonni Sanger Pharmacy:[facility]

## 2018-06-11 ENCOUNTER — Telehealth: Payer: Self-pay | Admitting: Family Medicine

## 2018-06-11 NOTE — Telephone Encounter (Signed)
She will need an appointment either with [place] for assessment or to see her dentist. We need to figure out the exact problem so we know how to best treat it.

## 2018-06-11 NOTE — Telephone Encounter (Signed)
Copied from [place] [id number]. Topic: Quick Communication - See Telephone Encounter >> Jun 11, 2018 12:12 PM Mylinda Latina, NT wrote: CRM for notification. See Telephone encounter for: 06/11/18. Patient called and states she would like a medication refilled. She has a tooth ached and does not want to go to the dentist. Patient does not know the name of the medication. She states Dr. Jonni Sanger prescribed this when she was still at [facility]. Please call patient CB# [facility] [address] [place] - [address] [phone] (Phone) [phone] (Fax)

## 2018-06-11 NOTE — Telephone Encounter (Signed)
Spoke with Patient and she states that she is having Jaw Pain from a tooth ache. She states that Dr. Haze Justin her an Antibiotic a while back (when she was at [facility]) and it cleared it up. I advised patient that Dr. Jonni Sanger is out of town, Does the patient need to have and OV or can we call something in.   Please advise.   Doloris Hall,  LPN

## 2018-06-11 NOTE — Telephone Encounter (Signed)
Spoke with Patient, advised her that she would need to be seen for evaluation, I advised patient and she scheduled an appointment for 06/12/2018   Doloris Hall,  LPN

## 2018-06-12 ENCOUNTER — Ambulatory Visit: Payer: PRIVATE HEALTH INSURANCE | Admitting: Physician Assistant

## 2018-06-12 ENCOUNTER — Encounter: Payer: Self-pay | Admitting: Physician Assistant

## 2018-06-12 ENCOUNTER — Other Ambulatory Visit: Payer: Self-pay

## 2018-06-12 VITALS — BP 110/80 | HR 81 | Temp 98.2°F | Resp 14 | Ht 63.0 in | Wt 183.0 lb

## 2018-06-12 DIAGNOSIS — K047 Periapical abscess without sinus: Secondary | ICD-10-CM

## 2018-06-12 MED ORDER — AMOXICILLIN 875 MG PO TABS: 875.0000 mg | ORAL_TABLET | Freq: Two times a day (BID) | ORAL | 0 refills | Status: DC

## 2018-06-12 NOTE — Patient Instructions (Signed)
Please stay hydrated and get plenty of rest. Take the antibiotic as directed with food. Avoid harder foods. Chew on the other side when possible. Get some OTC Orajel to apply to the area.   Follow-up with PCP or Dentist if symptoms are not resolving.

## 2018-06-12 NOTE — Progress Notes (Signed)
Patient presents to clinic today c/o dental pain x 4 days. Notes she does have cavities in the area. Denies fever, chills. Denies any trauma or injury. Has history of dental infection. Is having some mild headache 2/2 dental pain. Denies ear pain or URI symptoms.   Past Medical History:  Diagnosis Date  . Anxiety   . Depression   . Diabetes mellitus without complication ([place])   . Hyperlipemia   . Hypertension     Current Outpatient Medications on File Prior to Visit  Medication Sig Dispense Refill  . Ascorbic Acid (VITAMIN C) 1000 MG tablet Take 1,000 mg by mouth daily.    . Biotin 1000 MCG tablet Take 1,000 mcg by mouth 3 (three) times daily.    Renard Hamper Pepper-Turmeric (TURMERIC CURCUMIN) 02-999 MG CAPS Take by mouth.    . Cholecalciferol (D3-1000) 1000 units tablet Take 1,000 Units by mouth daily.    . diclofenac (VOLTAREN) 75 MG EC tablet Take 1 tablet (75 mg total) by mouth 2 (two) times daily as needed. 60 tablet 2  . ezetimibe (ZETIA) 10 MG tablet ezetimibe 10 mg tablet  TAKE 1 TABLET EVERY DAY 90 tablet 3  . magnesium oxide (MAG-OX) 400 MG tablet Take 400 mg by mouth daily.    . Melatonin 5 MG CAPS Take by mouth.    . OMEGA-3 KRILL OIL PO Take by mouth.    . Potassium 99 MG TABS Take by mouth.    . vitamin B-12 (CYANOCOBALAMIN) 500 MCG tablet Take 500 mcg by mouth daily.     No current facility-administered medications on file prior to visit.     Allergies  Allergen Reactions  . Statins Other (See Comments)    myalgia    Family History  Problem Relation Age of Onset  . Alzheimer's disease Mother   . Diabetes Daughter     Social History   Socioeconomic History  . Marital status: Single    Spouse name: Not on file  . Number of children: Not on file  . Years of education: Not on file  . Highest education level: Not on file  Occupational History  . Not on file  Social Needs  . Financial resource strain: Not on file  . Food insecurity:    Worry: Not on file     Inability: Not on file  . Transportation needs:    Medical: Not on file    Non-medical: Not on file  Tobacco Use  . Smoking status: Former Smoker    Years: 34.00  . Smokeless tobacco: Never Used  Substance and Sexual Activity  . Alcohol use: No  . Drug use: No  . Sexual activity: Not on file  Lifestyle  . Physical activity:    Days per week: Not on file    Minutes per session: Not on file  . Stress: Not on file  Relationships  . Social connections:    Talks on phone: Not on file    Gets together: Not on file    Attends religious service: Not on file    Active member of club or organization: Not on file    Attends meetings of clubs or organizations: Not on file    Relationship status: Not on file  Other Topics Concern  . Not on file  Social History Narrative  . Not on file   Review of Systems - See HPI.  All other ROS are negative.  BP 110/80   Pulse 81   Temp 98.2  F (36.8 C) (Oral)   Resp 14   Ht 5\' 3"  (1.6 m)   Wt 183 lb (83 kg)   SpO2 98%   BMI 32.42 kg/m   Physical Exam  Constitutional: She is oriented to person, place, and time. She appears well-developed and well-nourished.  HENT:  Head: Normocephalic and atraumatic.  Right Ear: External ear normal.  Mouth/Throat:    Eyes: Conjunctivae and EOM are normal.  Neck: Neck supple.  Cardiovascular: Normal rate, regular rhythm, normal heart sounds and intact distal pulses.  Pulmonary/Chest: Effort normal and breath sounds normal. No stridor. No respiratory distress. She has no wheezes. She has no rales. She exhibits no tenderness.  Neurological: She is alert and oriented to person, place, and time.  Psychiatric: She has a normal mood and affect.  Vitals reviewed.   Recent Results (from the past 2160 hour(s))  Microalbumin / creatinine urine ratio     Status: None   Collection Time: 05/18/18 10:49 AM  Result Value Ref Range   Microalb, Ur <0.7 0.0 - 1.9 mg/dL   Creatinine,U 57.3 mg/dL   Microalb  Creat Ratio 1.2 0.0 - 30.0 mg/g  Comprehensive metabolic panel     Status: Abnormal   Collection Time: 05/18/18 10:49 AM  Result Value Ref Range   Sodium 138 135 - 145 mEq/L   Potassium 4.5 3.5 - 5.1 mEq/L   Chloride 101 96 - 112 mEq/L   CO2 28 19 - 32 mEq/L   Glucose, Bld 117 (H) 70 - 99 mg/dL   BUN 11 6 - 23 mg/dL   Creatinine, Ser 0.67 0.40 - 1.20 mg/dL   Total Bilirubin 0.5 0.2 - 1.2 mg/dL   Alkaline Phosphatase 42 39 - 117 U/L   AST 13 0 - 37 U/L   ALT 12 0 - 35 U/L   Total Protein 7.6 6.0 - 8.3 g/dL   Albumin 4.2 3.5 - 5.2 g/dL   Calcium 9.4 8.4 - 10.5 mg/dL   GFR 94.11 >60.00 mL/min  Lipid panel     Status: Abnormal   Collection Time: 05/18/18 10:49 AM  Result Value Ref Range   Cholesterol 184 0 - 200 mg/dL    Comment: ATP III Classification       Desirable:  < 200 mg/dL               Borderline High:  200 - 239 mg/dL          High:  > = 240 mg/dL   Triglycerides 199.0 (H) 0.0 - 149.0 mg/dL    Comment: Normal:  <150 mg/dLBorderline High:  150 - 199 mg/dL   HDL 46.30 >39.00 mg/dL   VLDL 39.8 0.0 - 40.0 mg/dL   LDL Cholesterol 98 0 - 99 mg/dL   Total CHOL/HDL Ratio 4     Comment:                Men          Women1/2 Average Risk     3.4          3.3Average Risk          5.0          4.42X Average Risk          9.6          7.13X Average Risk          15.0          11.0  NonHDL 137.71     Comment: NOTE:  Non-HDL goal should be 30 mg/dL higher than patient's LDL goal (i.e. LDL goal of < 70 mg/dL, would have non-HDL goal of < 100 mg/dL)  CBC with Differential/Platelet     Status: None   Collection Time: 05/18/18 10:49 AM  Result Value Ref Range   WBC 6.8 4.0 - 10.5 K/uL   RBC 4.58 3.87 - 5.11 Mil/uL   Hemoglobin 13.5 12.0 - 15.0 g/dL   HCT 40.5 36.0 - 46.0 %   MCV 88.5 78.0 - 100.0 fl   MCHC 33.4 30.0 - 36.0 g/dL   RDW 13.7 11.5 - 15.5 %   Platelets 275.0 150.0 - 400.0 K/uL   Neutrophils Relative % 55.4 43.0 - 77.0 %   Lymphocytes Relative 35.2 12.0  - 46.0 %   Monocytes Relative 5.4 3.0 - 12.0 %   Eosinophils Relative 3.1 0.0 - 5.0 %   Basophils Relative 0.9 0.0 - 3.0 %   Neutro Abs 3.7 1.4 - 7.7 K/uL   Lymphs Abs 2.4 0.7 - 4.0 K/uL   Monocytes Absolute 0.4 0.1 - 1.0 K/uL   Eosinophils Absolute 0.2 0.0 - 0.7 K/uL   Basophils Absolute 0.1 0.0 - 0.1 K/uL  TSH     Status: None   Collection Time: 05/18/18 10:49 AM  Result Value Ref Range   TSH 3.02 0.35 - 4.50 uIU/mL  CK (Creatine Kinase)     Status: None   Collection Time: 05/18/18 10:49 AM  Result Value Ref Range   Total CK 58 7 - 177 U/L  POCT glycosylated hemoglobin (Hb A1C)     Status: Abnormal   Collection Time: 05/18/18 11:05 AM  Result Value Ref Range   Hemoglobin A1C 6.0 (A) 4.0 - 5.6 %   HbA1c POC (<> result, manual entry)  4.0 - 5.6 %   HbA1c, POC (prediabetic range)  5.7 - 6.4 %   HbA1c, POC (controlled diabetic range)  0.0 - 7.0 %   Assessment/Plan: 1. Dental infection Mild. Afebrile. No active fluctuance noted on examination. Will start Amoxicillin. Supportive measures and pain relief reviewed. Follow-up with dentist.   - amoxicillin (AMOXIL) 875 MG tablet; Take 1 tablet (875 mg total) by mouth 2 (two) times daily.  Dispense: 14 tablet; Refill: 0   Leeanne Rio, PA-C

## 2018-07-04 ENCOUNTER — Ambulatory Visit: Payer: PRIVATE HEALTH INSURANCE | Admitting: Family Medicine

## 2018-07-04 ENCOUNTER — Encounter: Payer: Self-pay | Admitting: Family Medicine

## 2018-07-04 ENCOUNTER — Other Ambulatory Visit: Payer: Self-pay

## 2018-07-04 VITALS — BP 124/86 | HR 80 | Temp 98.6°F | Ht 63.0 in | Wt 187.0 lb

## 2018-07-04 DIAGNOSIS — R600 Localized edema: Secondary | ICD-10-CM | POA: Diagnosis not present

## 2018-07-04 DIAGNOSIS — M222X2 Patellofemoral disorders, left knee: Secondary | ICD-10-CM

## 2018-07-04 DIAGNOSIS — Z23 Encounter for immunization: Secondary | ICD-10-CM | POA: Diagnosis not present

## 2018-07-04 DIAGNOSIS — M170 Bilateral primary osteoarthritis of knee: Secondary | ICD-10-CM

## 2018-07-04 DIAGNOSIS — M222X1 Patellofemoral disorders, right knee: Secondary | ICD-10-CM | POA: Insufficient documentation

## 2018-07-04 MED ORDER — TRIAMCINOLONE ACETONIDE 40 MG/ML IJ SUSP
40.0000 mg | Freq: Once | INTRAMUSCULAR | Status: AC
  Administered 2018-07-04: 40 mg via INTRA_ARTICULAR

## 2018-07-04 MED ORDER — HYDROCHLOROTHIAZIDE 12.5 MG PO CAPS: 12.5000 mg | ORAL_CAPSULE | Freq: Every day | ORAL | 3 refills | Status: DC | PRN

## 2018-07-04 NOTE — Patient Instructions (Signed)
Please return in 2-3 weeks if you'd like another steroid injection into your left knee and/or a skin biopsy. (would need 30 minute visit to do both at same time)  If you have any questions or concerns, please don't hesitate to send me a message via MyChart or call the office at [phone]. Thank you for visiting with [place] today! It's our pleasure caring for you.  Knee Injection, Care After Refer to this sheet in the next few weeks. These instructions provide you with information about caring for yourself after your procedure. Your health care provider may also give you more specific instructions. Your treatment has been planned according to current medical practices, but problems sometimes occur. Call your health care provider if you have any problems or questions after your procedure. What can I expect after the procedure? After the procedure, it is common to have:  Soreness.  Warmth.  Swelling.  You may have more pain, swelling, and warmth than you did before the injection. This reaction may last for about one day. Follow these instructions at home: Bathing  If you were given a bandage (dressing), keep it dry until your health care provider says it can be removed. Ask your health care provider when you can start showering or taking a bath. Managing pain, stiffness, and swelling  If directed, apply ice to the injection area: ? Put ice in a plastic bag. ? Place a towel between your skin and the bag. ? Leave the ice on for 20 minutes, 2-3 times per day.  Do not apply heat to your knee.  Raise the injection area above the level of your heart while you are sitting or lying down. Activity  Avoid strenuous activities for as long as directed by your health care provider. Ask your health care provider when you can return to your normal activities. General instructions  Take medicines only as directed by your health care provider.  Do not take aspirin or other over-the-counter medicines  unless your health care provider says you can.  Check your injection site every day for signs of infection. Watch for: ? Redness, swelling, or pain. ? Fluid, blood, or pus.  Follow your health care provider's instructions about dressing changes and removal. Contact a health care provider if:  You have symptoms at your injection site that last longer than two days after your procedure.  You have redness, swelling, or pain in your injection area.  You have fluid, blood, or pus coming from your injection site.  You have warmth in your injection area.  You have a fever.  Your pain is not controlled with medicine. Get help right away if:  Your knee turns very red.  Your knee becomes very swollen.  Your knee pain is severe. This information is not intended to replace advice given to you by your health care provider. Make sure you discuss any questions you have with your health care provider. Document Released: 10/31/2014 Document Revised: 06/15/2016 Document Reviewed: 08/20/2014 Elsevier Interactive Patient Education  [employer].

## 2018-07-04 NOTE — Progress Notes (Signed)
Subjective  CC:  Chief Complaint  Patient presents with  . Knee Pain    recheck on Knee Pain, Patient states inflammation is better but still has pain   . Immunizations    requests flu shot today     HPI: Sarah Walsh is a 62 y.o. female who presents to the office today to address the problems listed above in the chief complaint.  She has OA of the  Bilateral knee(s) and is having pain that impedes her quality of life and activity level. See past notes for further documentation.she is here for f/u: now on voltaren bid and this is helping although still with daily pain. No new swelling or sxs. Hasn't done vmo exercises for PFS regularly.  No contraindications to steroids are identified. She is a diet controlled diabetic. We have used alternative measures for pain control including tylenol, nsaids, ice, rest, with variable success.   H/o HTN and current lower ext edema. Requesting hctz refill. 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 On her feet most of the day. No calf pain.   BP Readings from Last 3 Encounters:  07/04/18 124/86  06/12/18 110/80  05/18/18 122/78    I reviewed the patients updated PMH, FH, and SocHx.    Patient Active Problem List   Diagnosis Date Noted  . Patellofemoral pain syndrome of both knees 07/04/2018  . Diet-controlled diabetes mellitus ([place]) 05/18/2018  . Lower extremity edema 03/03/2017  . Vitamin D deficiency 11/23/2015  . Combined hyperlipidemia associated with type 2 diabetes mellitus ([place]) 10/22/2015  . Primary osteoarthritis of both knees 10/22/2015  . Statin intolerance 10/22/2015  . Essential hypertension 11/05/2008   Current Meds  Medication Sig  . Ascorbic Acid (VITAMIN C) 1000 MG tablet Take 1,000 mg by mouth daily.  . Biotin 1000 MCG tablet Take 1,000 mcg by mouth 3 (three) times daily.  . Cholecalciferol (D3-1000) 1000 units tablet Take 1,000 Units by mouth daily.  . diclofenac (VOLTAREN) 75 MG  EC tablet Take 1 tablet (75 mg total) by mouth 2 (two) times daily as needed.  . ezetimibe (ZETIA) 10 MG tablet ezetimibe 10 mg tablet  TAKE 1 TABLET EVERY DAY  . magnesium oxide (MAG-OX) 400 MG tablet Take 400 mg by mouth daily.  . Melatonin 5 MG CAPS Take by mouth.  . OMEGA-3 KRILL OIL PO Take by mouth.  . Potassium 99 MG TABS Take by mouth.  . vitamin B-12 (CYANOCOBALAMIN) 500 MCG tablet Take 500 mcg by mouth daily.  . [DISCONTINUED] Black Pepper-Turmeric (TURMERIC CURCUMIN) 02-999 MG CAPS Take by mouth.    Allergies: Patient is allergic to statins.  Review of Systems: Constitutional: Negative for fever malaise or anorexia Cardiovascular: negative for chest pain Respiratory: negative for SOB or persistent cough Gastrointestinal: negative for abdominal pain  Objective  Vitals: BP 124/86   Pulse 80   Temp 98.6 F (37 C)   Ht 5\' 3"  (1.6 m)   Wt 187 lb (84.8 kg)   SpO2 98%   BMI 33.13 kg/m  General: no acute distress , A&Ox3 Cardiovascular:  RRR without murmur  Knee:  Left no collateral ligament laxity, positive patella grind test and no effusion, + crepitus, no warmth or redness, full ROM LE: tr edema bilaterally Skin:  Warm, no rashes Lab Results  Component Value Date   CREATININE 0.67 05/18/2018   BUN 11 05/18/2018   NA 138 05/18/2018   K 4.5 05/18/2018  CL 101 05/18/2018   CO2 28 05/18/2018    Procedure note for Knee Joint Aspiration and/or Injection:  Indications: pain relief, failed conservative therapies  Knee Arthrocentesis with Injection Procedure Note  Pre-operative Diagnosis: left knee OA with pain  Post-operative Diagnosis: same  Indications: Symptom relief from osteoarthritis  Anesthesia: Lidocaine 1% without epinephrine without added sodium bicarbonate  Procedure Details   Verbal consent was obtained for the procedure. Universal time out taken.  The Knee joint was prepped with alcohol and an 25 gauge needle was inserted into the joint from the  medial approach.  Four ml 1% lidocaine and one ml of triamcinolone (KENALOG) 40mg /ml was then injected into the joint through the same needle. The needle was removed and the area cleansed and dressed.  Complications:  None; patient tolerated the procedure well.   Assessment  1. Primary osteoarthritis of both knees   2. Lower extremity edema   3. Patellofemoral pain syndrome of both knees      Plan   S/p intra-articular knee joint steroid injection:  Routine post procedure care discussed. Rest, Ice, Nsaids as needed and ROM exercises recommended. F/u care printed out for patient in AVS. Can use tylenol. Hopefully will not need the regular voltaren dosing now. Can return for right steroid injection if helpful. Will also consider trial of synvisc. rec vmo exercises again.   HCTZ mini dose for mild edema. bp is stable.   Discussed urgent f/u if develops increased pain, redness or fever.   Flu shot today.   Follow up: steroid injection, knee right if needed.     Commons side effects, risks, benefits, and alternatives for medications and treatment plan prescribed today were discussed, and the patient expressed understanding of the given instructions. Patient is instructed to call or message via MyChart if he/she has any questions or concerns regarding our treatment plan. No barriers to understanding were identified. We discussed Red Flag symptoms and signs in detail. Patient expressed understanding regarding what to do in case of urgent or emergency type symptoms.   Medication list was reconciled, printed and provided to the patient in AVS. Patient instructions and summary information was reviewed with the patient as documented in the AVS. This note was prepared with assistance of Dragon voice recognition software. Occasional wrong-word or sound-a-like substitutions may have occurred due to the inherent limitations of voice recognition software  No orders of the defined types were placed in  this encounter.  Meds ordered this encounter  Medications  . hydrochlorothiazide (MICROZIDE) 12.5 MG capsule    Sig: Take 1 capsule (12.5 mg total) by mouth daily as needed.    Dispense:  90 capsule    Refill:  3

## 2018-08-19 ENCOUNTER — Other Ambulatory Visit: Payer: Self-pay | Admitting: Family Medicine

## 2018-09-12 ENCOUNTER — Encounter: Payer: Self-pay | Admitting: Family Medicine

## 2018-09-12 ENCOUNTER — Other Ambulatory Visit: Payer: Self-pay

## 2018-09-12 ENCOUNTER — Ambulatory Visit: Payer: PRIVATE HEALTH INSURANCE | Admitting: Family Medicine

## 2018-09-12 VITALS — BP 118/84 | HR 91 | Temp 98.3°F | Ht 63.0 in | Wt 187.0 lb

## 2018-09-12 DIAGNOSIS — K0510 Chronic gingivitis, plaque induced: Secondary | ICD-10-CM | POA: Diagnosis not present

## 2018-09-12 DIAGNOSIS — Z1239 Encounter for other screening for malignant neoplasm of breast: Secondary | ICD-10-CM

## 2018-09-12 MED ORDER — AMOXICILLIN 875 MG PO TABS: 875.0000 mg | ORAL_TABLET | Freq: Two times a day (BID) | ORAL | 0 refills | Status: AC

## 2018-09-12 NOTE — Progress Notes (Signed)
Subjective  CC:  Chief Complaint  Patient presents with  . Dental Pain    hurts in jaw x since this past weekend    HPI: Sarah Walsh is a 62 y.o. female who presents to the office today to address the problems listed above in the chief complaint.  62 year old with well-controlled diabetes presents with left lower jaw swelling and soreness.  Has had some dental problems over the last year or so.  Trying to hold off going to the dentist until April of next year when she gets dental coverage.  She denies severe pain.  Has minimal pain with chewing or eating.  Symptoms of been ongoing for 2 to 3 days only.  No fevers or chills.  Had similar problem in August and treated with amoxicillin with good results.  Health maintenance: Patient due for complete physical and multiple screening test.  She would like to defer at this time due to her job.  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  It is hard for her to get away for appointments at this time. Assessment  1. Gingivitis   2. Breast cancer screening      Plan   Gingivitis versus early dental abscess, mild: Treat with oral antibiotics and Advil.  Follow-up if not improving.  Defer x-ray imaging per patient's request at this time.  Health maintenance: Set up mammography.  Defer physical until January.  Discussed need for colonoscopy, diabetic eye exam.  Follow up: Return in about 8 weeks (around 11/07/2018) for complete physical, follow up Diabetes.   Orders Placed This Encounter  Procedures  . MM DIGITAL SCREENING BILATERAL   Meds ordered this encounter  Medications  . amoxicillin (AMOXIL) 875 MG tablet    Sig: Take 1 tablet (875 mg total) by mouth 2 (two) times daily for 7 days.    Dispense:  14 tablet    Refill:  0      I reviewed the patients updated PMH, FH, and SocHx.    Patient Active Problem List   Diagnosis Date Noted  . Patellofemoral pain syndrome of both knees 07/04/2018  . Diet-controlled  diabetes mellitus ([place]) 05/18/2018  . Lower extremity edema 03/03/2017  . Vitamin D deficiency 11/23/2015  . Combined hyperlipidemia associated with type 2 diabetes mellitus ([place]) 10/22/2015  . Primary osteoarthritis of both knees 10/22/2015  . Statin intolerance 10/22/2015  . Essential hypertension 11/05/2008   Current Meds  Medication Sig  . Ascorbic Acid (VITAMIN C) 1000 MG tablet Take 1,000 mg by mouth daily.  . Biotin 1000 MCG tablet Take 1,000 mcg by mouth 3 (three) times daily.  . Cholecalciferol (D3-1000) 1000 units tablet Take 1,000 Units by mouth daily.  . diclofenac (VOLTAREN) 75 MG EC tablet TAKE 1 TABLET BY MOUTH TWICE DAILY AS NEEDED  . magnesium oxide (MAG-OX) 400 MG tablet Take 400 mg by mouth daily.  . Melatonin 5 MG CAPS Take by mouth.  . OMEGA-3 KRILL OIL PO Take by mouth.  . Potassium 99 MG TABS Take by mouth.  . vitamin B-12 (CYANOCOBALAMIN) 500 MCG tablet Take 500 mcg by mouth daily.    Allergies: Patient is allergic to statins. Family History: Patient family history includes Alzheimer's disease in her mother; Diabetes in her daughter. Social History:  Patient  reports that she has quit smoking. She quit after 34.00 years of use. She has never used smokeless tobacco. She reports that she does not drink alcohol or use drugs.  Review  of Systems: Constitutional: Negative for fever malaise or anorexia Cardiovascular: negative for chest pain Respiratory: negative for SOB or persistent cough Gastrointestinal: negative for abdominal pain  Objective  Vitals: BP 118/84   Pulse 91   Temp 98.3 F (36.8 C)   Ht 5\' 3"  (1.6 m)   Wt 187 lb (84.8 kg)   SpO2 99%   BMI 33.13 kg/m  General: no acute distress , A&Ox3 HEENT: PEERL, conjunctiva normal, Oropharynx moist,neck is supple Left lower gumline with mild swelling and soreness, no dental pain, minimal redness Cardiovascular:  RRR without murmur or gallop.  Respiratory:  Good breath sounds bilaterally, CTAB with  normal respiratory effort Skin:  Warm, no rashes     Commons side effects, risks, benefits, and alternatives for medications and treatment plan prescribed today were discussed, and the patient expressed understanding of the given instructions. Patient is instructed to call or message via MyChart if he/she has any questions or concerns regarding our treatment plan. No barriers to understanding were identified. We discussed Red Flag symptoms and signs in detail. Patient expressed understanding regarding what to do in case of urgent or emergency type symptoms.   Medication list was reconciled, printed and provided to the patient in AVS. Patient instructions and summary information was reviewed with the patient as documented in the AVS. This note was prepared with assistance of Dragon voice recognition software. Occasional wrong-word or sound-a-like substitutions may have occurred due to the inherent limitations of voice recognition software

## 2018-09-12 NOTE — Patient Instructions (Addendum)
Please return for your annual complete physical; please come fasting and f/u diabetes in Jan 2020.  Please go get your mammogram. We have placed a referral to [facility]  Other things that are needed include an eye exam for diabetes, colonoscopy and your physical.    If you have any questions or concerns, please don't hesitate to send me a message via MyChart or call the office at [phone]. Thank you for visiting with Sarah Walsh today! It's our pleasure caring for you.

## 2018-10-24 HISTORY — PX: POLYPECTOMY: SHX149

## 2018-10-24 HISTORY — PX: COLONOSCOPY: SHX174

## 2018-11-25 ENCOUNTER — Other Ambulatory Visit: Payer: Self-pay | Admitting: Family Medicine

## 2018-11-26 ENCOUNTER — Ambulatory Visit: Payer: PRIVATE HEALTH INSURANCE | Admitting: Family Medicine

## 2018-11-26 ENCOUNTER — Other Ambulatory Visit: Payer: Self-pay

## 2018-11-26 ENCOUNTER — Encounter: Payer: Self-pay | Admitting: Family Medicine

## 2018-11-26 VITALS — BP 118/78 | HR 79 | Temp 99.4°F | Resp 16 | Ht 63.0 in | Wt 190.0 lb

## 2018-11-26 DIAGNOSIS — E119 Type 2 diabetes mellitus without complications: Secondary | ICD-10-CM | POA: Diagnosis not present

## 2018-11-26 DIAGNOSIS — I10 Essential (primary) hypertension: Secondary | ICD-10-CM | POA: Diagnosis not present

## 2018-11-26 DIAGNOSIS — H8110 Benign paroxysmal vertigo, unspecified ear: Secondary | ICD-10-CM | POA: Diagnosis not present

## 2018-11-26 DIAGNOSIS — F430 Acute stress reaction: Secondary | ICD-10-CM | POA: Diagnosis not present

## 2018-11-26 DIAGNOSIS — B379 Candidiasis, unspecified: Secondary | ICD-10-CM

## 2018-11-26 DIAGNOSIS — L409 Psoriasis, unspecified: Secondary | ICD-10-CM

## 2018-11-26 LAB — POCT GLYCOSYLATED HEMOGLOBIN (HGB A1C): HEMOGLOBIN A1C: 6.8 % — AB (ref 4.0–5.6)

## 2018-11-26 MED ORDER — KETOCONAZOLE 2 % EX CREA: 1.0000 "application " | TOPICAL_CREAM | Freq: Two times a day (BID) | CUTANEOUS | 0 refills | Status: AC

## 2018-11-26 MED ORDER — FLUOXETINE HCL 20 MG PO TABS: ORAL_TABLET | ORAL | 2 refills | Status: DC

## 2018-11-26 MED ORDER — TRIAMCINOLONE ACETONIDE 0.025 % EX LOTN: TOPICAL_LOTION | CUTANEOUS | Status: DC

## 2018-11-26 MED ORDER — METFORMIN HCL 500 MG PO TABS: 500.0000 mg | ORAL_TABLET | Freq: Two times a day (BID) | ORAL | 3 refills | Status: DC

## 2018-11-26 NOTE — Progress Notes (Signed)
Subjective  CC:  Chief Complaint  Patient presents with  . Psoriasis    She states that she has noticed all over areas. She is concerned that psoriasis may be causing increase in arthritis  . Depression    She is wanting to restart on Prozac, she is living with a pt that is dying and has had increased depression    HPI: Sarah Walsh is a 63 y.o. female who presents to the office today for follow up of diabetes and problems listed above in the chief complaint.   Diet controlled Diabetes follow up: Her diabetic control is reported as Worse. 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 Sarah Walsh is exhausted, feels down and trapped. She hasn't been able to eat well (stress eating) and has not been able to exercise. She denies sxs of hyperglycemia. She is not on medications for dm at this time as it had been well controlled in the past.  She denies exertional CP or SOB or symptomatic hypoglycemia. She denies foot sores or paresthesias.   HTN: doing fine.   Mood: reports feeling down due to the stress of care taking as noted above. Had been on prozac in prior years: after her divorce, after the death of her parents and reports it works very well. She believes it would be helpful again.  Depression screen [facility] 2/9 11/26/2018 03/10/2015  Decreased Interest 3 0  Down, Depressed, Hopeless 3 0  PHQ - 2 Score 6 0  Altered sleeping 3 -  Tired, decreased energy 3 -  Change in appetite 3 -  Feeling bad or failure about yourself  0 -  Trouble concentrating 3 -  Moving slowly or fidgety/restless 0 -  Suicidal thoughts 0 -  PHQ-9 Score 18 -  Difficult doing work/chores Somewhat difficult -    Rashes: reports very red itching rash beneath both underarms. Also with itch flaking rash on scalp and a few patches on forearm. ? Psoriasis. Using otc creams w/o help. No associated sxs.   Wt Readings from Last 3 Encounters:  11/26/18 190  lb (86.2 kg)  09/12/18 187 lb (84.8 kg)  07/04/18 187 lb (84.8 kg)    BP Readings from Last 3 Encounters:  11/26/18 118/78  09/12/18 118/84  07/04/18 124/86    Assessment  1. Diet-controlled diabetes mellitus ([place])   2. Essential hypertension   3. Stress reaction   4. Benign paroxysmal positional vertigo, unspecified laterality   5. Psoriasis   6. Candidiasis      Plan   Diabetes is currently adequately controlled. But trending upward. Due to difficulties and barriers to weight loss at this time, will start low dose metformin. Recheck 3 months  HTN is well controlled.   Stress reaction: start prozac. Counseling done. Recheck 6-12 weeks.   Rashes: psoriasis and candidiasis. Start cream for psoriasis, steroid; nizoral for candidiasis. Add nystatin if needed.   HM: overdue for eye exam, mammo, cpe but her current work schedule is a barrier. Will continue to try to assist in getting her caught up.   Follow up: Return in about 3 months (around 02/24/2019) for complete physical.. Orders Placed This Encounter  Procedures  . POCT glycosylated hemoglobin (Hb A1C)   Meds ordered this encounter  Medications  . FLUoxetine (PROZAC) 20 MG tablet    Sig: Take 0.5 tablets (10 mg total) by mouth daily for 7 days, THEN 1 tablet (20 mg total)  daily.    Dispense:  30 tablet    Refill:  2  . metFORMIN (GLUCOPHAGE) 500 MG tablet    Sig: Take 1 tablet (500 mg total) by mouth 2 (two) times daily with a meal.    Dispense:  180 tablet    Refill:  3  . ketoconazole (NIZORAL) 2 % cream    Sig: Apply 1 application topically 2 (two) times daily for 7 days. Then as needed    Dispense:  30 g    Refill:  0  . Triamcinolone Acetonide 0.025 % LOTN    Sig: Use on dry patches twice a day as needed    Dispense:  60 mL    Refill:  02      Immunization History  Administered Date(s) Administered  . Influenza, Seasonal, Injecte, Preservative Fre 10/22/2015, 07/15/2016  . Influenza,inj,Quad PF,6+ Mos  09/29/2017, 07/04/2018  . PPD Test 09/21/2016  . Pneumococcal Polysaccharide-23 10/22/2015  . Tdap 11/22/2010  . Zoster 11/23/2015  . Zoster Recombinat (Shingrix) 05/18/2018    Diabetes Related Lab Review: Lab Results  Component Value Date   HGBA1C 6.8 (A) 11/26/2018   HGBA1C 6.0 (A) 05/18/2018   HGBA1C 5.8 09/29/2017    Lab Results  Component Value Date   MICROALBUR <0.7 05/18/2018   Lab Results  Component Value Date   CREATININE 0.67 05/18/2018   BUN 11 05/18/2018   NA 138 05/18/2018   K 4.5 05/18/2018   CL 101 05/18/2018   CO2 28 05/18/2018   Lab Results  Component Value Date   CHOL 184 05/18/2018   CHOL 226 (H) 01/08/2008   Lab Results  Component Value Date   HDL 46.30 05/18/2018   HDL 38 (L) 01/08/2008   Lab Results  Component Value Date   LDLCALC 98 05/18/2018   LDLCALC 158 (H) 01/08/2008   Lab Results  Component Value Date   TRIG 199.0 (H) 05/18/2018   TRIG 151 (H) 01/08/2008   Lab Results  Component Value Date   CHOLHDL 4 05/18/2018   CHOLHDL 5.9 Ratio 01/08/2008   No results found for: LDLDIRECT The 10-year ASCVD risk score Mikey Bussing DC Jr., et al., 2013) is: 11.1%   Values used to calculate the score:     Age: 63 years     Sex: Female     Is Non-Hispanic African American: No     Diabetic: Yes     Tobacco smoker: No     Systolic Blood Pressure: 967 mmHg     Is BP treated: Yes     HDL Cholesterol: 46.3 mg/dL     Total Cholesterol: 184 mg/dL I have reviewed the [place], Fam and Soc history. Patient Active Problem List   Diagnosis Date Noted  . Patellofemoral pain syndrome of both knees 07/04/2018  . Diet-controlled diabetes mellitus ([place]) 05/18/2018  . Lower extremity edema 03/03/2017  . Vitamin D deficiency 11/23/2015  . Combined hyperlipidemia associated with type 2 diabetes mellitus ([place]) 10/22/2015  . Primary osteoarthritis of both knees 10/22/2015  . Statin intolerance 10/22/2015  . Essential hypertension 11/05/2008    Qualifier:  Diagnosis of  By: Wynetta Emery RN, Doroteo Bradford       Social History: Patient  reports that she has quit smoking. She quit after 34.00 years of use. She has never used smokeless tobacco. She reports that she does not drink alcohol or use drugs.  Review of Systems: Ophthalmic: negative for eye pain, loss of vision or double vision Cardiovascular: negative for chest pain Respiratory: negative  for SOB or persistent cough Gastrointestinal: negative for abdominal pain Genitourinary: negative for dysuria or gross hematuria MSK: negative for foot lesions Neurologic: negative for weakness or gait disturbance  Objective  Vitals: BP 118/78   Pulse 79   Temp 99.4 F (37.4 C) (Oral)   Resp 16   Ht 5\' 3"  (1.6 m)   Wt 190 lb (86.2 kg)   SpO2 97%   BMI 33.66 kg/m  General: well appearing, no acute distress  Psych:  Alert and oriented, normal mood and affect HEENT:  Normocephalic, atraumatic, moist mucous membranes, supple neck  Cardiovascular:  Nl S1 and S2, RRR without murmur, gallop or rub. no edema Respiratory:  Good breath sounds bilaterally, CTAB with normal effort, no rales Skin:  Warm, scalp:several flaking patches; clear hairline; small flaking patch on right forearm. L>R red macular rash w/o flaking Neurologic:   Mental status is normal. normal gait     Diabetic education: ongoing education regarding chronic disease management for diabetes was given today. We continue to reinforce the ABC's of diabetic management: A1c (<7 or 8 dependent upon patient), tight blood pressure control, and cholesterol management with goal LDL < 100 minimally. We discuss diet strategies, exercise recommendations, medication options and possible side effects. At each visit, we review recommended immunizations and preventive care recommendations for diabetics and stress that good diabetic control can prevent other problems. See below for this patient's data.    Commons side effects, risks, benefits, and  alternatives for medications and treatment plan prescribed today were discussed, and the patient expressed understanding of the given instructions. Patient is instructed to call or message via MyChart if he/she has any questions or concerns regarding our treatment plan. No barriers to understanding were identified. We discussed Red Flag symptoms and signs in detail. Patient expressed understanding regarding what to do in case of urgent or emergency type symptoms.   Medication list was reconciled, printed and provided to the patient in AVS. Patient instructions and summary information was reviewed with the patient as documented in the AVS. This note was prepared with assistance of Dragon voice recognition software. Occasional wrong-word or sound-a-like substitutions may have occurred due to the inherent limitations of voice recognition software

## 2018-11-26 NOTE — Patient Instructions (Addendum)
Please return in 1-3 months for your annual complete physical; please come fasting.  Please go and get your mammogram.  Use the creams: the cream on your underarm rash, the lotion for the scalp and patch on forearm.  If you have any questions or concerns, please don't hesitate to send me a message via MyChart or call the office at [phone]. Thank you for visiting with [place] today! It's our pleasure caring for you.

## 2018-12-28 ENCOUNTER — Telehealth: Payer: Self-pay | Admitting: Family Medicine

## 2019-01-02 ENCOUNTER — Other Ambulatory Visit: Payer: Self-pay | Admitting: *Deleted

## 2019-01-02 NOTE — Telephone Encounter (Signed)
Sent to Dr. Jonni Sanger to see if she will refill

## 2019-01-02 NOTE — Telephone Encounter (Signed)
Patient states she thought she was suppose to be taking Diclofenac Sodium for her arthritis. She then said she isn't sure if that is even the correct medicine but just know she is in a lot of pain and has nothing to take. She would like the nurse to call her

## 2019-01-03 ENCOUNTER — Other Ambulatory Visit: Payer: Self-pay | Admitting: Family Medicine

## 2019-01-03 MED ORDER — DICLOFENAC SODIUM 75 MG PO TBEC: 75.0000 mg | DELAYED_RELEASE_TABLET | Freq: Two times a day (BID) | ORAL | 1 refills | Status: DC | PRN

## 2019-01-03 NOTE — Telephone Encounter (Signed)
Pt states she thought someone called her yesterday to say this was approved.  She is sitting at [employer]. Advised pt I would send for urgent approval.  Pt state she needs asap  [place], [place] - [address] [phone] (Phone) [phone] (Fax)

## 2019-01-03 NOTE — Telephone Encounter (Signed)
Patient received medication.

## 2019-03-22 ENCOUNTER — Ambulatory Visit: Payer: PRIVATE HEALTH INSURANCE | Admitting: Family Medicine

## 2019-03-22 ENCOUNTER — Other Ambulatory Visit: Payer: Self-pay

## 2019-03-22 ENCOUNTER — Encounter: Payer: Self-pay | Admitting: Family Medicine

## 2019-03-22 DIAGNOSIS — Z111 Encounter for screening for respiratory tuberculosis: Secondary | ICD-10-CM

## 2019-03-22 DIAGNOSIS — N63 Unspecified lump in unspecified breast: Secondary | ICD-10-CM

## 2019-03-22 DIAGNOSIS — E119 Type 2 diabetes mellitus without complications: Secondary | ICD-10-CM

## 2019-03-22 LAB — TB SKIN TEST
Induration: 0 mm
TB Skin Test: NEGATIVE

## 2019-03-22 NOTE — Progress Notes (Signed)
Virtual Visit via Video Note  Subjective  CC:  Chief Complaint  Patient presents with  . Mammogram Referral  . Colonoscopy Referral     I connected with [name] on 03/25/19 at  2:20 PM EDT by a video enabled telemedicine application and verified that I am speaking with the correct person using two identifiers. Location patient: Home Location provider:  [facility], Office Persons participating in the virtual visit: Anastasia Fiedler, MD Lilli Light, [place] discussed the limitations of evaluation and management by telemedicine and the availability of in person appointments. The patient expressed understanding and agreed to proceed. HPI: Sarah Walsh is a 63 y.o. female who was contacted today to address the problems listed above in the chief complaint. . Patient reports she noticed a lump on her left breast a few days ago.  Tender to touch.  Would like to have a mammogram and bone density screening done.  No history of breast cancer.  No nipple discharge.  She is overdue for mammogram. . Type 2 diabetes overdue for follow-up.  She is taking metformin successfully.  No symptoms of hyperglycemia Assessment  1. Breast lump   2. Screening for tuberculosis   3. Controlled type 2 diabetes mellitus without complication, without long-term current use of insulin ([facility])      Plan   Breast lump: Recommend in office evaluation next week for physical exam.  Then will order mammogram based on findings and her ultrasound.  Diabetes: Improved base symptoms.  Follow-up in the office for A1c testing.  Screening tuberculosis: This was an error.  Please disregard I discussed the assessment and treatment plan with the patient. The patient was provided an opportunity to ask questions and all were answered. The patient agreed with the plan and demonstrated an understanding of the instructions.   The patient was advised to call back or seek an  in-person evaluation if the symptoms worsen or if the condition fails to improve as anticipated. Follow up: No follow-ups on file.  Visit date not found  No orders of the defined types were placed in this encounter.     I reviewed the patients updated PMH, FH, and SocHx.    Patient Active Problem List   Diagnosis Date Noted  . Controlled type 2 diabetes mellitus without complication, without long-term current use of insulin ([place]) 03/22/2019  . Patellofemoral pain syndrome of both knees 07/04/2018  . Diet-controlled diabetes mellitus ([place]) 05/18/2018  . Lower extremity edema 03/03/2017  . Vitamin D deficiency 11/23/2015  . Combined hyperlipidemia associated with type 2 diabetes mellitus ([place]) 10/22/2015  . Primary osteoarthritis of both knees 10/22/2015  . Statin intolerance 10/22/2015  . Essential hypertension 11/05/2008   Current Meds  Medication Sig  . Ascorbic Acid (VITAMIN C) 1000 MG tablet Take 1,000 mg by mouth daily.  . Biotin 1000 MCG tablet Take 1,000 mcg by mouth 3 (three) times daily.  . calcium citrate (CALCITRATE - DOSED IN MG ELEMENTAL CALCIUM) 950 MG tablet Take 200 mg of elemental calcium by mouth daily.  . Cholecalciferol (D3-1000) 1000 units tablet Take 1,000 Units by mouth daily.  . diclofenac (VOLTAREN) 75 MG EC tablet Take 1 tablet (75 mg total) by mouth 2 (two) times daily as needed.  Marland Kitchen FLUoxetine (PROZAC) 20 MG tablet Take 0.5 tablets (10 mg total) by mouth daily for 7 days, THEN 1 tablet (20 mg total) daily.  . hydrochlorothiazide (MICROZIDE) 12.5  MG capsule Take 1 capsule (12.5 mg total) by mouth daily as needed.  . magnesium oxide (MAG-OX) 400 MG tablet Take 400 mg by mouth daily.  . metFORMIN (GLUCOPHAGE) 500 MG tablet Take 1 tablet (500 mg total) by mouth 2 (two) times daily with a meal.  . OMEGA-3 KRILL OIL PO Take by mouth.  . Potassium 99 MG TABS Take by mouth.  . vitamin B-12 (CYANOCOBALAMIN) 500 MCG tablet Take 500 mcg by mouth daily.  .  [DISCONTINUED] Melatonin 5 MG CAPS Take by mouth.  . [DISCONTINUED] Triamcinolone Acetonide 0.025 % LOTN Use on dry patches twice a day as needed    Allergies: Patient is allergic to statins. Family History: Patient family history includes Alzheimer's disease in her mother; Diabetes in her daughter. Social History:  Patient  reports that she has quit smoking. She quit after 34.00 years of use. She has never used smokeless tobacco. She reports that she does not drink alcohol or use drugs.  Review of Systems: Constitutional: Negative for fever malaise or anorexia Cardiovascular: negative for chest pain Respiratory: negative for SOB or persistent cough Gastrointestinal: negative for abdominal pain  OBJECTIVE Vitals: There were no vitals taken for this visit. General: no acute distress , A&Ox3  Leamon Arnt, MD

## 2019-03-25 ENCOUNTER — Ambulatory Visit (INDEPENDENT_AMBULATORY_CARE_PROVIDER_SITE_OTHER): Payer: PPO | Admitting: Family Medicine

## 2019-03-25 ENCOUNTER — Other Ambulatory Visit: Payer: Self-pay

## 2019-03-25 ENCOUNTER — Encounter: Payer: Self-pay | Admitting: Family Medicine

## 2019-03-25 VITALS — BP 128/82 | HR 92 | Temp 97.8°F | Resp 16 | Ht 63.0 in | Wt 188.4 lb

## 2019-03-25 DIAGNOSIS — F430 Acute stress reaction: Secondary | ICD-10-CM | POA: Diagnosis not present

## 2019-03-25 DIAGNOSIS — E119 Type 2 diabetes mellitus without complications: Secondary | ICD-10-CM

## 2019-03-25 DIAGNOSIS — I10 Essential (primary) hypertension: Secondary | ICD-10-CM

## 2019-03-25 DIAGNOSIS — Z1239 Encounter for other screening for malignant neoplasm of breast: Secondary | ICD-10-CM

## 2019-03-25 DIAGNOSIS — E2839 Other primary ovarian failure: Secondary | ICD-10-CM | POA: Diagnosis not present

## 2019-03-25 LAB — POCT GLYCOSYLATED HEMOGLOBIN (HGB A1C): Hemoglobin A1C: 6.2 % — AB (ref 4.0–5.6)

## 2019-03-25 MED ORDER — PNEUMOCOCCAL 13-VAL CONJ VACC IM SUSP: 0.5000 mL | Freq: Once | INTRAMUSCULAR | 0 refills | Status: AC

## 2019-03-25 NOTE — Progress Notes (Signed)
Subjective  CC:  Chief Complaint  Patient presents with  . Breast Mass    Left side, sore/tender to the touch  . Diabetes  . Hypertension  . Depression    HPI: Sarah Walsh is a 63 y.o. female who presents to the office today for follow up of diabetes and problems listed above in the chief complaint.   Diabetes follow up: Her diabetic control is reported as Improved.  She started taking metformin again back in February and is doing well with it.  She does note that she has some symptoms of hyperglycemia including nocturia if she eats poorly in the evening.  However most of the time she is feeling well. She denies exertional CP or SOB or symptomatic hypoglycemia. She denies foot sores or paresthesias.  She is due for Prevnar.  Depression: At last visit she was very down.  We started Prozac and she has responded well to this.  Fortunately, her life circumstances have improved as well.  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  She is now living at home.  She is back being more social.  All of these things have helped her mood.  She is tolerating Prozac.  Hypertension has been well controlled and she denies symptoms of heart disease.  About a week ago she noticed a small pea-sized tender bump on the left side of the outer breast.  She is overdue for a screening mammogram.  She has a history of breast implants.  Today, however, she can no longer feel the mass.  No nipple discharge. Wt Readings from Last 3 Encounters:  03/25/19 188 lb 6.4 oz (85.5 kg)  11/26/18 190 lb (86.2 kg)  09/12/18 187 lb (84.8 kg)    BP Readings from Last 3 Encounters:  03/25/19 128/82  11/26/18 118/78  09/12/18 118/84    Assessment  1. Controlled type 2 diabetes mellitus without complication, without long-term current use of insulin (HCC)   2. Stress reaction   3. Essential hypertension   4. Breast cancer screening   5. Hypoestrogenism      Plan   Diabetes is  currently very well controlled.  Doing well on low-dose metformin.  Continue to watch her diet.  Prevnar prescription given.  She will be due for lab work next month.  We will check a urine microalbuminuria at that time.  She is not currently on an ACE inhibitor.  May start this at next visit.  Stress reaction: Doing much better on Prozac.  Continue.  Recent breast lump no longer present.  Schedule screening mammogram and bone density.  Follow-up for complete physical next month.  Follow up: Return in about 6 weeks (around 05/06/2019) for complete physical.. Orders Placed This Encounter  Procedures  . MM DIGITAL SCREENING BILATERAL  . DG Bone Density  . POCT glycosylated hemoglobin (Hb A1C)   Meds ordered this encounter  Medications  . pneumococcal 13-valent conjugate vaccine (PREVNAR 13) SUSP injection    Sig: Inject 0.5 mLs into the muscle once for 1 dose.    Dispense:  0.5 mL    Refill:  0      Immunization History  Administered Date(s) Administered  . Influenza, Seasonal, Injecte, Preservative Fre 10/22/2015, 07/15/2016  . Influenza,inj,Quad PF,6+ Mos 09/29/2017, 07/04/2018  . PPD Test 09/21/2016  . Pneumococcal Polysaccharide-23 10/22/2015  . Tdap 11/22/2010  . Zoster 11/23/2015  . Zoster Recombinat (Shingrix) 05/18/2018    Diabetes Related Lab Review:  Lab Results  Component Value Date   HGBA1C 6.2 (A) 03/25/2019   HGBA1C 6.8 (A) 11/26/2018   HGBA1C 6.0 (A) 05/18/2018    Lab Results  Component Value Date   MICROALBUR <0.7 05/18/2018   Lab Results  Component Value Date   CREATININE 0.67 05/18/2018   BUN 11 05/18/2018   NA 138 05/18/2018   K 4.5 05/18/2018   CL 101 05/18/2018   CO2 28 05/18/2018   Lab Results  Component Value Date   CHOL 184 05/18/2018   CHOL 226 (H) 01/08/2008   Lab Results  Component Value Date   HDL 46.30 05/18/2018   HDL 38 (L) 01/08/2008   Lab Results  Component Value Date   LDLCALC 98 05/18/2018   LDLCALC 158 (H) 01/08/2008    Lab Results  Component Value Date   TRIG 199.0 (H) 05/18/2018   TRIG 151 (H) 01/08/2008   Lab Results  Component Value Date   CHOLHDL 4 05/18/2018   CHOLHDL 5.9 Ratio 01/08/2008   No results found for: LDLDIRECT The 10-year ASCVD risk score Mikey Bussing DC Jr., et al., 2013) is: 14.3%   Values used to calculate the score:     Age: 63 years     Sex: Female     Is Non-Hispanic African American: No     Diabetic: Yes     Tobacco smoker: No     Systolic Blood Pressure: 637 mmHg     Is BP treated: Yes     HDL Cholesterol: 46.3 mg/dL     Total Cholesterol: 184 mg/dL I have reviewed the [facility] history. Patient Active Problem List   Diagnosis Date Noted  . Controlled type 2 diabetes mellitus without complication, without long-term current use of insulin ([place]) 03/22/2019  . Patellofemoral pain syndrome of both knees 07/04/2018  . Diet-controlled diabetes mellitus ([place]) 05/18/2018  . Lower extremity edema 03/03/2017  . Vitamin D deficiency 11/23/2015  . Combined hyperlipidemia associated with type 2 diabetes mellitus ([place]) 10/22/2015  . Primary osteoarthritis of both knees 10/22/2015  . Statin intolerance 10/22/2015  . Essential hypertension 11/05/2008    Qualifier: Diagnosis of  By: Wynetta Emery RN, Doroteo Bradford       Social History: Patient  reports that she has quit smoking. She quit after 34.00 years of use. She has never used smokeless tobacco. She reports that she does not drink alcohol or use drugs.  Review of Systems: Ophthalmic: negative for eye pain, loss of vision or double vision Cardiovascular: negative for chest pain Respiratory: negative for SOB or persistent cough Gastrointestinal: negative for abdominal pain Genitourinary: negative for dysuria or gross hematuria MSK: negative for foot lesions Neurologic: negative for weakness or gait disturbance  Objective  Vitals: BP 128/82   Pulse 92   Temp 97.8 F (36.6 C) (Oral)   Resp 16   Ht 5\' 3"  (1.6 m)   Wt 188 lb 6.4  oz (85.5 kg)   SpO2 98%   BMI 33.37 kg/m  General: well appearing, no acute distress  Psych:  Alert and oriented, normal mood and affect HEENT:  Normocephalic, atraumatic, moist mucous membranes, supple neck  Cardiovascular:  Nl S1 and S2, RRR without murmur, gallop or rub. no edema Respiratory:  Good breath sounds bilaterally, CTAB with normal effort, no rales Gastrointestinal: normal BS, soft, nontender Skin:  Warm, no rashes Breast: Bilateral breast implants present limiting exam.  No palpable masses bilaterally, no lymphadenopathy.  No nipple discharge. Neurologic:   Mental status is normal.  normal gait Foot exam: no erythema, pallor, or cyanosis visible nl proprioception and sensation to monofilament testing bilaterally, +2 distal pulses bilaterally    Diabetic education: ongoing education regarding chronic disease management for diabetes was given today. We continue to reinforce the ABC's of diabetic management: A1c (<7 or 8 dependent upon patient), tight blood pressure control, and cholesterol management with goal LDL < 100 minimally. We discuss diet strategies, exercise recommendations, medication options and possible side effects. At each visit, we review recommended immunizations and preventive care recommendations for diabetics and stress that good diabetic control can prevent other problems. See below for this patient's data.    Commons side effects, risks, benefits, and alternatives for medications and treatment plan prescribed today were discussed, and the patient expressed understanding of the given instructions. Patient is instructed to call or message via MyChart if he/she has any questions or concerns regarding our treatment plan. No barriers to understanding were identified. We discussed Red Flag symptoms and signs in detail. Patient expressed understanding regarding what to do in case of urgent or emergency type symptoms.   Medication list was reconciled, printed and  provided to the patient in AVS. Patient instructions and summary information was reviewed with the patient as documented in the AVS. This note was prepared with assistance of Dragon voice recognition software. Occasional wrong-word or sound-a-like substitutions may have occurred due to the inherent limitations of voice recognition software

## 2019-03-25 NOTE — Patient Instructions (Signed)
Please return in July for your complete physical with fasting lab work.   Please take the RX for Prevnar to your pharmacy for your vaccination.  Your diabetes looks good! Keep taking the medication and watching your diet. Start walking for exercise.   If you have any questions or concerns, please don't hesitate to send me a message via MyChart or call the office at [phone]. Thank you for visiting with [place] today! It's our pleasure caring for you.  We will call you with information regarding your referral appointment. Mammogram and bone density test.  If you do not hear from [place] within the next 2 weeks, please let me know. It can take 1-2 weeks to get appointments set up with the specialists.

## 2019-04-09 ENCOUNTER — Other Ambulatory Visit: Payer: Self-pay | Admitting: Family Medicine

## 2019-05-17 ENCOUNTER — Other Ambulatory Visit: Payer: Self-pay

## 2019-05-17 ENCOUNTER — Other Ambulatory Visit: Payer: Self-pay | Admitting: *Deleted

## 2019-05-17 ENCOUNTER — Encounter: Payer: Self-pay | Admitting: Family Medicine

## 2019-05-17 ENCOUNTER — Encounter: Payer: PPO | Admitting: Family Medicine

## 2019-05-17 ENCOUNTER — Telehealth: Payer: Self-pay | Admitting: Family Medicine

## 2019-05-17 MED ORDER — DICLOFENAC SODIUM 75 MG PO TBEC: 75.0000 mg | DELAYED_RELEASE_TABLET | Freq: Two times a day (BID) | ORAL | 1 refills | Status: DC | PRN

## 2019-05-17 NOTE — Telephone Encounter (Signed)
Made in error

## 2019-05-20 ENCOUNTER — Ambulatory Visit: Payer: PPO | Admitting: Family Medicine

## 2019-05-22 ENCOUNTER — Telehealth: Payer: PPO | Admitting: Family Medicine

## 2019-05-29 ENCOUNTER — Other Ambulatory Visit: Payer: Self-pay

## 2019-05-29 ENCOUNTER — Ambulatory Visit (INDEPENDENT_AMBULATORY_CARE_PROVIDER_SITE_OTHER): Payer: PPO | Admitting: Family Medicine

## 2019-05-29 ENCOUNTER — Encounter: Payer: Self-pay | Admitting: Family Medicine

## 2019-05-29 VITALS — BP 124/82 | HR 85 | Temp 98.7°F | Resp 16 | Ht 63.0 in | Wt 175.2 lb

## 2019-05-29 DIAGNOSIS — F430 Acute stress reaction: Secondary | ICD-10-CM

## 2019-05-29 DIAGNOSIS — E119 Type 2 diabetes mellitus without complications: Secondary | ICD-10-CM | POA: Diagnosis not present

## 2019-05-29 DIAGNOSIS — K635 Polyp of colon: Secondary | ICD-10-CM

## 2019-05-29 DIAGNOSIS — E2839 Other primary ovarian failure: Secondary | ICD-10-CM | POA: Diagnosis not present

## 2019-05-29 DIAGNOSIS — Z1239 Encounter for other screening for malignant neoplasm of breast: Secondary | ICD-10-CM

## 2019-05-29 DIAGNOSIS — M5431 Sciatica, right side: Secondary | ICD-10-CM | POA: Diagnosis not present

## 2019-05-29 DIAGNOSIS — I10 Essential (primary) hypertension: Secondary | ICD-10-CM | POA: Diagnosis not present

## 2019-05-29 HISTORY — DX: Polyp of colon: K63.5

## 2019-05-29 MED ORDER — FLUOXETINE HCL 20 MG PO TABS: 20.0000 mg | ORAL_TABLET | Freq: Every day | ORAL | 3 refills | Status: DC

## 2019-05-29 MED ORDER — HYDROCHLOROTHIAZIDE 12.5 MG PO CAPS: 12.5000 mg | ORAL_CAPSULE | Freq: Every day | ORAL | 3 refills | Status: DC | PRN

## 2019-05-29 MED ORDER — PREDNISONE 10 MG PO TABS: ORAL_TABLET | ORAL | 0 refills | Status: DC

## 2019-05-29 MED ORDER — METFORMIN HCL 500 MG PO TABS: 500.0000 mg | ORAL_TABLET | Freq: Two times a day (BID) | ORAL | 3 refills | Status: DC

## 2019-05-29 NOTE — Patient Instructions (Signed)
Please follow up as scheduled for your next visit with me: 06/18/2019   If you have any questions or concerns, please don't hesitate to send me a message via MyChart or call the office at [phone]. Thank you for visiting with [place] today! It's our pleasure caring for you.   Back Exercises The following exercises strengthen the muscles that help to support the trunk and back. They also help to keep the lower back flexible. Doing these exercises can help to prevent back pain or lessen existing pain.  If you have back pain or discomfort, try doing these exercises 2-3 times each day or as told by your health care provider.  As your pain improves, do them once each day, but increase the number of times that you repeat the steps for each exercise (do more repetitions).  To prevent the recurrence of back pain, continue to do these exercises once each day or as told by your health care provider. Do exercises exactly as told by your health care provider and adjust them as directed. It is normal to feel mild stretching, pulling, tightness, or discomfort as you do these exercises, but you should stop right away if you feel sudden pain or your pain gets worse. Exercises Single knee to chest Repeat these steps 3-5 times for each leg: 1. Lie on your back on a firm bed or the floor with your legs extended. 2. Bring one knee to your chest. Your other leg should stay extended and in contact with the floor. 3. Hold your knee in place by grabbing your knee or thigh with both hands and hold. 4. Pull on your knee until you feel a gentle stretch in your lower back or buttocks. 5. Hold the stretch for 10-30 seconds. 6. Slowly release and straighten your leg. Pelvic tilt Repeat these steps 5-10 times: 1. Lie on your back on a firm bed or the floor with your legs extended. 2. Bend your knees so they are pointing toward the ceiling and your feet are flat on the floor. 3. Tighten your lower abdominal muscles to  press your lower back against the floor. This motion will tilt your pelvis so your tailbone points up toward the ceiling instead of pointing to your feet or the floor. 4. With gentle tension and even breathing, hold this position for 5-10 seconds. Cat-cow Repeat these steps until your lower back becomes more flexible: 1. Get into a hands-and-knees position on a firm surface. Keep your hands under your shoulders, and keep your knees under your hips. You may place padding under your knees for comfort. 2. Let your head hang down toward your chest. Contract your abdominal muscles and point your tailbone toward the floor so your lower back becomes rounded like the back of a cat. 3. Hold this position for 5 seconds. 4. Slowly lift your head, let your abdominal muscles relax and point your tailbone up toward the ceiling so your back forms a sagging arch like the back of a cow. 5. Hold this position for 5 seconds.  Press-ups Repeat these steps 5-10 times: 1. Lie on your abdomen (face-down) on the floor. 2. Place your palms near your head, about shoulder-width apart. 3. Keeping your back as relaxed as possible and keeping your hips on the floor, slowly straighten your arms to raise the top half of your body and lift your shoulders. Do not use your back muscles to raise your upper torso. You may adjust the placement of your hands to make yourself  more comfortable. 4. Hold this position for 5 seconds while you keep your back relaxed. 5. Slowly return to lying flat on the floor.  Bridges Repeat these steps 10 times: 1. Lie on your back on a firm surface. 2. Bend your knees so they are pointing toward the ceiling and your feet are flat on the floor. Your arms should be flat at your sides, next to your body. 3. Tighten your buttocks muscles and lift your buttocks off the floor until your waist is at almost the same height as your knees. You should feel the muscles working in your buttocks and the back of  your thighs. If you do not feel these muscles, slide your feet 1-2 inches farther away from your buttocks. 4. Hold this position for 3-5 seconds. 5. Slowly lower your hips to the starting position, and allow your buttocks muscles to relax completely. If this exercise is too easy, try doing it with your arms crossed over your chest. Abdominal crunches Repeat these steps 5-10 times: 1. Lie on your back on a firm bed or the floor with your legs extended. 2. Bend your knees so they are pointing toward the ceiling and your feet are flat on the floor. 3. Cross your arms over your chest. 4. Tip your chin slightly toward your chest without bending your neck. 5. Tighten your abdominal muscles and slowly raise your trunk (torso) high enough to lift your shoulder blades a tiny bit off the floor. Avoid raising your torso higher than that because it can put too much stress on your low back and does not help to strengthen your abdominal muscles. 6. Slowly return to your starting position. Back lifts Repeat these steps 5-10 times: 1. Lie on your abdomen (face-down) with your arms at your sides, and rest your forehead on the floor. 2. Tighten the muscles in your legs and your buttocks. 3. Slowly lift your chest off the floor while you keep your hips pressed to the floor. Keep the back of your head in line with the curve in your back. Your eyes should be looking at the floor. 4. Hold this position for 3-5 seconds. 5. Slowly return to your starting position. Contact a health care provider if:  Your back pain or discomfort gets much worse when you do an exercise.  Your worsening back pain or discomfort does not lessen within 2 hours after you exercise. If you have any of these problems, stop doing these exercises right away. Do not do them again unless your health care provider says that you can. Get help right away if:  You develop sudden, severe back pain. If this happens, stop doing the exercises right  away. Do not do them again unless your health care provider says that you can. This information is not intended to replace advice given to you by your health care provider. Make sure you discuss any questions you have with your health care provider. Document Released: 11/17/2004 Document Revised: 02/14/2019 Document Reviewed: 07/12/2018 Elsevier Patient Education  2020 [place].   Sciatica  Sciatica is pain, numbness, weakness, or tingling along the path of the sciatic nerve. The sciatic nerve starts in the lower back and runs down the back of each leg. The nerve controls the muscles in the lower leg and in the back of the knee. It also provides feeling (sensation) to the back of the thigh, the lower leg, and the sole of the foot. Sciatica is a symptom of another medical condition that pinches  or puts pressure on the sciatic nerve. Sciatica most often only affects one side of the body. Sciatica usually goes away on its own or with treatment. In some cases, sciatica may come back (recur). What are the causes? This condition is caused by pressure on the sciatic nerve or pinching of the nerve. This may be the result of:  A disk in between the bones of the spine bulging out too far (herniated disk).  Age-related changes in the spinal disks.  A pain disorder that affects a muscle in the buttock.  Extra bone growth near the sciatic nerve.  A break (fracture) of the pelvis.  Pregnancy.  Tumor. This is rare. What increases the risk? The following factors may make you more likely to develop this condition:  Playing sports that place pressure or stress on the spine.  Having poor strength and flexibility.  A history of back injury or surgery.  Sitting for long periods of time.  Doing activities that involve repetitive bending or lifting.  Obesity. What are the signs or symptoms? Symptoms can vary from mild to very severe, and they may include:  Any of these problems in the lower  back, leg, hip, or buttock: ? Mild tingling, numbness, or dull aches. ? Burning sensations. ? Sharp pains.  Numbness in the back of the calf or the sole of the foot.  Leg weakness.  Severe back pain that makes movement difficult. Symptoms may get worse when you cough, sneeze, or laugh, or when you sit or stand for long periods of time. How is this diagnosed? This condition may be diagnosed based on:  Your symptoms and medical history.  A physical exam.  Blood tests.  Imaging tests, such as: ? X-rays. ? MRI. ? CT scan. How is this treated? In many cases, this condition improves on its own without treatment. However, treatment may include:  Reducing or modifying physical activity.  Exercising and stretching.  Icing and applying heat to the affected area.  Medicines that help to: ? Relieve pain and swelling. ? Relax your muscles.  Injections of medicines that help to relieve pain, irritation, and inflammation around the sciatic nerve (steroids).  Surgery. Follow these instructions at home: Medicines  Take over-the-counter and prescription medicines only as told by your health care provider.  Ask your health care provider if the medicine prescribed to you: ? Requires you to avoid driving or using heavy machinery. ? Can cause constipation. You may need to take these actions to prevent or treat constipation:  Drink enough fluid to keep your urine pale yellow.  Take over-the-counter or prescription medicines.  Eat foods that are high in fiber, such as beans, whole grains, and fresh fruits and vegetables.  Limit foods that are high in fat and processed sugars, such as fried or sweet foods. Managing pain      If directed, put ice on the affected area. ? Put ice in a plastic bag. ? Place a towel between your skin and the bag. ? Leave the ice on for 20 minutes, 2-3 times a day.  If directed, apply heat to the affected area. Use the heat source that your health  care provider recommends, such as a moist heat pack or a heating pad. ? Place a towel between your skin and the heat source. ? Leave the heat on for 20-30 minutes. ? Remove the heat if your skin turns bright red. This is especially important if you are unable to feel pain, heat, or cold. You  may have a greater risk of getting burned. Activity   Return to your normal activities as told by your health care provider. Ask your health care provider what activities are safe for you.  Avoid activities that make your symptoms worse.  Take brief periods of rest throughout the day. ? When you rest for longer periods, mix in some mild activity or stretching between periods of rest. This will help to prevent stiffness and pain. ? Avoid sitting for long periods of time without moving. Get up and move around at least one time each hour.  Exercise and stretch regularly, as told by your health care provider.  Do not lift anything that is heavier than 10 lb (4.5 kg) while you have symptoms of sciatica. When you do not have symptoms, you should still avoid heavy lifting, especially repetitive heavy lifting.  When you lift objects, always use proper lifting technique, which includes: ? Bending your knees. ? Keeping the load close to your body. ? Avoiding twisting. General instructions  Maintain a healthy weight. Excess weight puts extra stress on your back.  Wear supportive, comfortable shoes. Avoid wearing high heels.  Avoid sleeping on a mattress that is too soft or too hard. A mattress that is firm enough to support your back when you sleep may help to reduce your pain.  Keep all follow-up visits as told by your health care provider. This is important. Contact a health care provider if:  You have pain that: ? Wakes you up when you are sleeping. ? Gets worse when you lie down. ? Is worse than you have experienced in the past. ? Lasts longer than 4 weeks.  You have an unexplained weight loss.  Get help right away if:  You are not able to control when you urinate or have bowel movements (incontinence).  You have: ? Weakness in your lower back, pelvis, buttocks, or legs that gets worse. ? Redness or swelling of your back. ? A burning sensation when you urinate. Summary  Sciatica is pain, numbness, weakness, or tingling along the path of the sciatic nerve.  This condition is caused by pressure on the sciatic nerve or pinching of the nerve.  Sciatica can cause pain, numbness, or tingling in the lower back, legs, hips, and buttocks.  Treatment often includes rest, exercise, medicines, and applying ice or heat. This information is not intended to replace advice given to you by your health care provider. Make sure you discuss any questions you have with your health care provider. Document Released: 10/04/2001 Document Revised: 10/29/2018 Document Reviewed: 10/29/2018 Elsevier Patient Education  [place].

## 2019-05-29 NOTE — Progress Notes (Signed)
Subjective  CC:  Chief Complaint  Patient presents with  . Buttock and thigh pain    States pain is in the buttocks and radiates down thight. Started about 6 weeks ago. She has tried IBU with minimal relief  . Note needed    She is wanting to know if she can have a note stating she is healthy enough to donate plasma    HPI: Sarah Walsh is a 63 y.o. female who presents to the office today to address the problems listed above in the chief complaint.  Stress reaction: improved. Started prozac in June and feels it is helping. Working again. Happier. No AEs.   DM remains controlled; requesting refill of metformin. Needs urine MAC and possible Ace.   HTN on hctz; will discuss ace   Never got mammo nor dexa scheduled. I ordered in June  Right buttock pain radiating to right post thigh to knee. X 6 weeks. Worse with prolonged sitting. Has tried ibuprofen w/o relief. Mild to moderate. No weakness or severe pain. Sleeps well. Worse with sitting all day at work. Has remote h/o sciatica. No rash.    Assessment  1. Right sided sciatica   2. Essential hypertension   3. Controlled type 2 diabetes mellitus without complication, without long-term current use of insulin ([place])   4. Breast cancer screening   5. Hypoestrogenism   6. Stress reaction      Plan   sciatica:  Education. Stretches. pred taper.   HTn and DM have been controlled. Recheck later this month with labs. Adjust meds then. Refilled met and hctz  HM: due for colonoscopy, mammo and dexa. Updated prevnar. Refer back to [place] for colonoscopy ; crc screen. [place] but recommended 5 year f/u. Had one in 2005 by Delfin Edis showing polyps.   Stress reaction: much improved. Continue prozac.  Follow up: Return for as scheduled.  06/18/2019  Orders Placed This Encounter  Procedures  . MM DIGITAL SCREENING BILATERAL  . DG Bone Density   Meds ordered this encounter  Medications  . hydrochlorothiazide  (MICROZIDE) 12.5 MG capsule    Sig: Take 1 capsule (12.5 mg total) by mouth daily as needed.    Dispense:  90 capsule    Refill:  3  . metFORMIN (GLUCOPHAGE) 500 MG tablet    Sig: Take 1 tablet (500 mg total) by mouth 2 (two) times daily with a meal.    Dispense:  180 tablet    Refill:  3  . FLUoxetine (PROZAC) 20 MG tablet    Sig: Take 1 tablet (20 mg total) by mouth daily.    Dispense:  90 tablet    Refill:  3  . predniSONE (DELTASONE) 10 MG tablet    Sig: Take 4 tabs qd x 2 days, 3 qd x 2 days, 2 qd x 2d, 1qd x 3 days    Dispense:  21 tablet    Refill:  0      I reviewed the patients updated PMH, FH, and SocHx.    Patient Active Problem List   Diagnosis Date Noted  . Controlled type 2 diabetes mellitus without complication, without long-term current use of insulin ([place]) 03/22/2019  . Patellofemoral pain syndrome of both knees 07/04/2018  . Lower extremity edema 03/03/2017  . Vitamin D deficiency 11/23/2015  . Combined hyperlipidemia associated with type 2 diabetes mellitus ([place]) 10/22/2015  . Primary osteoarthritis of both knees 10/22/2015  . Statin intolerance 10/22/2015  .  Essential hypertension 11/05/2008   Current Meds  Medication Sig  . Ascorbic Acid (VITAMIN C) 1000 MG tablet Take 1,000 mg by mouth daily.  . Biotin 1000 MCG tablet Take 1,000 mcg by mouth 3 (three) times daily.  . calcium citrate (CALCITRATE - DOSED IN MG ELEMENTAL CALCIUM) 950 MG tablet Take 200 mg of elemental calcium by mouth daily.  . Cholecalciferol (D3-1000) 1000 units tablet Take 1,000 Units by mouth daily.  . diclofenac (VOLTAREN) 75 MG EC tablet Take 1 tablet (75 mg total) by mouth 2 (two) times daily as needed.  Marland Kitchen FLUoxetine (PROZAC) 20 MG tablet Take 1 tablet (20 mg total) by mouth daily.  . magnesium oxide (MAG-OX) 400 MG tablet Take 400 mg by mouth daily.  . OMEGA-3 KRILL OIL PO Take by mouth.  . Potassium 99 MG TABS Take by mouth.  . vitamin B-12 (CYANOCOBALAMIN) 500 MCG tablet Take  500 mcg by mouth daily.  . [DISCONTINUED] FLUoxetine (PROZAC) 20 MG tablet Take 0.5 tablets (10 mg total) by mouth daily for 7 days, THEN 1 tablet (20 mg total) daily.  . [DISCONTINUED] hydrochlorothiazide (MICROZIDE) 12.5 MG capsule Take 1 capsule (12.5 mg total) by mouth daily as needed.  . [DISCONTINUED] metFORMIN (GLUCOPHAGE) 500 MG tablet Take 1 tablet (500 mg total) by mouth 2 (two) times daily with a meal.    Allergies: Patient is allergic to statins. Family History: Patient family history includes Alzheimer's disease in her mother; Diabetes in her daughter. Social History:  Patient  reports that she has quit smoking. She quit after 34.00 years of use. She has never used smokeless tobacco. She reports that she does not drink alcohol or use drugs.  Review of Systems: Constitutional: Negative for fever malaise or anorexia Cardiovascular: negative for chest pain Respiratory: negative for SOB or persistent cough Gastrointestinal: negative for abdominal pain  Objective  Vitals: BP 124/82   Pulse 85   Temp 98.7 F (37.1 C) (Oral)   Resp 16   Ht 5' 3" (1.6 m)   Wt 175 lb 3.2 oz (79.5 kg)   SpO2 98%   BMI 31.04 kg/m  General: no acute distress , A&Ox3 HEENT: PEERL, conjunctiva normal, Oropharynx moist,neck is supple Cardiovascular:  RRR without murmur or gallop.  Respiratory:  Good breath sounds bilaterally, CTAB with normal respiratory effort Skin:  Warm, no rashes Back: right sciatic notch ttp, good rom. Neg slr bilaterally, nl strength and gait.      Commons side effects, risks, benefits, and alternatives for medications and treatment plan prescribed today were discussed, and the patient expressed understanding of the given instructions. Patient is instructed to call or message via MyChart if he/she has any questions or concerns regarding our treatment plan. No barriers to understanding were identified. We discussed Red Flag symptoms and signs in detail. Patient expressed  understanding regarding what to do in case of urgent or emergency type symptoms.   Medication list was reconciled, printed and provided to the patient in AVS. Patient instructions and summary information was reviewed with the patient as documented in the AVS. This note was prepared with assistance of Dragon voice recognition software. Occasional wrong-word or sound-a-like substitutions may have occurred due to the inherent limitations of voice recognition software

## 2019-06-03 ENCOUNTER — Encounter: Payer: Self-pay | Admitting: Physician Assistant

## 2019-06-03 ENCOUNTER — Encounter: Payer: Self-pay | Admitting: Family Medicine

## 2019-06-03 ENCOUNTER — Ambulatory Visit (INDEPENDENT_AMBULATORY_CARE_PROVIDER_SITE_OTHER): Payer: PPO | Admitting: Physician Assistant

## 2019-06-03 VITALS — Temp 98.8°F | Ht 63.0 in | Wt 175.0 lb

## 2019-06-03 DIAGNOSIS — K0889 Other specified disorders of teeth and supporting structures: Secondary | ICD-10-CM | POA: Diagnosis not present

## 2019-06-03 MED ORDER — AMOXICILLIN 875 MG PO TABS: 875.0000 mg | ORAL_TABLET | Freq: Two times a day (BID) | ORAL | 0 refills | Status: DC

## 2019-06-03 NOTE — Progress Notes (Signed)
Virtual Visit via Video   I connected with Sarah Walsh on 06/03/19 at  4:00 PM EDT by a video enabled telemedicine application and verified that I am speaking with the correct person using two identifiers. Location patient: Home Location provider: [facility], Office Persons participating in the virtual visit: Lorenna, Lurry PA-C.  I discussed the limitations of evaluation and management by telemedicine and the availability of in person appointments. The patient expressed understanding and agreed to proceed.  I acted as a Education administrator for [employer], PA-C [employer], LPN  Subjective:   HPI:    Jaw pain Pt c/o left sided lower jaw pain since Friday night. Denies any known inciting event. Brushes teeth at least 3 times a day. Pain is getting worse with time. Pain is currently 8/10. Pt is alternating Tylenol and Ibuprofen every 4 hours. Using ice continuously to jaw. Late last year she saw her PCP for something similar and she seems to think it has recurred (she never followed up the dentist after that visit because she improved so quickly.) Does have an appointment with a dentist on 8/25 -- this was the soonest she could get. Does endorse difficulty chewing foods on L side of mouth.  Denies: fevers, chills, purulent drainage in mouth, neck pain, difficulty swallowing or controlling secretions   ROS: See pertinent positives and negatives per HPI.  Patient Active Problem List   Diagnosis Date Noted  . Colon polyp 05/29/2019  . Controlled type 2 diabetes mellitus without complication, without long-term current use of insulin ([place]) 03/22/2019  . Patellofemoral pain syndrome of both knees 07/04/2018  . Lower extremity edema 03/03/2017  . Vitamin D deficiency 11/23/2015  . Combined hyperlipidemia associated with type 2 diabetes mellitus ([place]) 10/22/2015  . Primary osteoarthritis of both knees 10/22/2015  . Statin intolerance 10/22/2015  . Essential hypertension  11/05/2008    Social History   Tobacco Use  . Smoking status: Former Smoker    Years: 34.00  . Smokeless tobacco: Never Used  Substance Use Topics  . Alcohol use: No    Current Outpatient Medications:  .  Ascorbic Acid (VITAMIN C) 1000 MG tablet, Take 1,000 mg by mouth daily., Disp: , Rfl:  .  Biotin 1000 MCG tablet, Take 1,000 mcg by mouth 3 (three) times daily., Disp: , Rfl:  .  calcium citrate (CALCITRATE - DOSED IN MG ELEMENTAL CALCIUM) 950 MG tablet, Take 200 mg of elemental calcium by mouth daily., Disp: , Rfl:  .  Cholecalciferol (D3-1000) 1000 units tablet, Take 1,000 Units by mouth daily., Disp: , Rfl:  .  diclofenac (VOLTAREN) 75 MG EC tablet, Take 1 tablet (75 mg total) by mouth 2 (two) times daily as needed., Disp: 60 tablet, Rfl: 1 .  FLUoxetine (PROZAC) 20 MG tablet, Take 1 tablet (20 mg total) by mouth daily., Disp: 90 tablet, Rfl: 3 .  hydrochlorothiazide (MICROZIDE) 12.5 MG capsule, Take 1 capsule (12.5 mg total) by mouth daily as needed., Disp: 90 capsule, Rfl: 3 .  magnesium oxide (MAG-OX) 400 MG tablet, Take 400 mg by mouth daily., Disp: , Rfl:  .  metFORMIN (GLUCOPHAGE) 500 MG tablet, Take 1 tablet (500 mg total) by mouth 2 (two) times daily with a meal., Disp: 180 tablet, Rfl: 3 .  OMEGA-3 KRILL OIL PO, Take by mouth., Disp: , Rfl:  .  Potassium 99 MG TABS, Take by mouth., Disp: , Rfl:  .  vitamin B-12 (CYANOCOBALAMIN) 500 MCG tablet, Take 500 mcg  by mouth daily., Disp: , Rfl:  .  amoxicillin (AMOXIL) 875 MG tablet, Take 1 tablet (875 mg total) by mouth 2 (two) times daily., Disp: 20 tablet, Rfl: 0  Allergies  Allergen Reactions  . Statins Other (See Comments)    myalgia    Objective:   VITALS: Per patient if applicable, see vitals. GENERAL: Alert, appears well and in no acute distress. HEENT: Atraumatic, conjunctiva clear, no obvious abnormalities on inspection of external nose and ears. **Mild swelling noted in L bottom of mandible compared to R NECK:  Normal movements of the head and neck. CARDIOPULMONARY: No increased WOB. Speaking in clear sentences. I:E ratio WNL.  MS: Moves all visible extremities without noticeable abnormality. PSYCH: Pleasant and cooperative, well-groomed. Speech normal rate and rhythm. Affect is appropriate. Insight and judgement are appropriate. Attention is focused, linear, and appropriate.  NEURO: CN grossly intact. Oriented as arrived to appointment on time with no prompting. Moves both UE equally.  SKIN: No obvious lesions, wounds, erythema, or cyanosis noted on face or hands.  Assessment and Plan:   Sarah Walsh was seen today for jaw pain.  Diagnoses and all orders for this visit:  Pain, dental  Other orders -     amoxicillin (AMOXIL) 875 MG tablet; Take 1 tablet (875 mg total) by mouth 2 (two) times daily.   Reviewed possible diagnoses -- including gingivitis, abscess, among others. No red flags on discussion. She did well when previously treated with amoxicillin, will trial this. Continue icing and anti-inflammatories. Worsening precautions advised. Strongly recommended she follow-up with dentist, even if she is feeling better as the appointment approaches.  . Reviewed expectations re: course of current medical issues. . Discussed self-management of symptoms. . Outlined signs and symptoms indicating need for more acute intervention. . Patient verbalized understanding and all questions were answered. Marland Kitchen Health Maintenance issues including appropriate healthy diet, exercise, and smoking avoidance were discussed with patient. . See orders for this visit as documented in the electronic medical record.  I discussed the assessment and treatment plan with the patient. The patient was provided an opportunity to ask questions and all were answered. The patient agreed with the plan and demonstrated an understanding of the instructions.   The patient was advised to call back or seek an in-person evaluation if the  symptoms worsen or if the condition fails to improve as anticipated.   CMA or LPN served as scribe during this visit. History, Physical, and Plan performed by medical provider. The above documentation has been reviewed and is accurate and complete.   [place], [place] 06/03/2019

## 2019-06-11 ENCOUNTER — Telehealth: Payer: Self-pay | Admitting: Family Medicine

## 2019-06-11 MED ORDER — AMOXICILLIN 875 MG PO TABS: 875.0000 mg | ORAL_TABLET | Freq: Two times a day (BID) | ORAL | 0 refills | Status: DC

## 2019-06-11 NOTE — Telephone Encounter (Signed)
Sarah Walsh, I do not have Bio form not sure what it is. Rx refill sent.

## 2019-06-11 NOTE — Telephone Encounter (Signed)
Copied from [place] [id number]. Topic: General - Other >> Jun 11, 2019 11:17 AM Leward Quan A wrote: Reason for CRM: Patient called to inform Dr Jonni Sanger that she still need the refill on the antibiotics as discussed and that the swelling has gone down a bit but still puffy and tender to the touch. She also say that [employer] received the form that was faxed in but that it is incomplete asking can the questions be answered and form refax to [employer] please. Any questions please contact patient

## 2019-06-11 NOTE — Telephone Encounter (Signed)
Pt is aware and also calling [employer] to have form completed

## 2019-06-11 NOTE — Telephone Encounter (Signed)
See note

## 2019-06-11 NOTE — Telephone Encounter (Signed)
See below.. Pt was seen on 8/10 for dental pain and given Amox..  We will need the form faxed back to fix, copy has not been scanned into the chart yet.

## 2019-06-11 NOTE — Telephone Encounter (Signed)
Rx refill for Amoxicillin sent to pharmacy enough till Dentist appt on 8/25.  In regards to Bio form I do not have that. Will check with Dr. Tamela Oddi nurse.

## 2019-06-11 NOTE — Telephone Encounter (Signed)
Copied from [place] [id number]. Topic: Quick Communication - Rx Refill/Question >> Jun 11, 2019 11:14 AM Leward Quan A wrote: Medication: amoxicillin (AMOXIL) 875 MG tablet  Has the patient contacted their pharmacy?  yes  (Agent: If no, request that the patient contact the pharmacy for the refill.) (Agent: If yes, when and what did the pharmacy advise?)  Preferred Pharmacy (with phone number or street name): [facility] [address], [place] [phone] (Phone) [phone] (Fax)    Agent: Please be advised that RX refills may take up to 3 business days. We ask that you follow-up with your pharmacy.

## 2019-06-12 ENCOUNTER — Encounter: Payer: Self-pay | Admitting: *Deleted

## 2019-06-12 ENCOUNTER — Telehealth: Payer: Self-pay | Admitting: Gastroenterology

## 2019-06-12 NOTE — Telephone Encounter (Signed)
DOD 05/29/19 Dr. Lyndel Safe, this pt was referred for a colonoscopy.  She dropped off her previous colon and path report today; they will be sent to you for review.    Please advise scheduling.

## 2019-06-13 NOTE — Telephone Encounter (Signed)
Have reviewed colonoscopy report from 09/24/2004: MiraLAX of prep, colonic polyps status post polypectomy. Bx-hyperplastic.  Plan OK to schedule for colonoscopy directly.  Please make sure she satisfies [facility] criteria. Pl scan the colonoscopy report from 09/24/2004  Thx  RG

## 2019-06-13 NOTE — Telephone Encounter (Signed)
Left message for patient to call back to the office to be scheduled for her repeat colonoscopy with Dr. Bryan Lemma at [facility];

## 2019-06-14 NOTE — Telephone Encounter (Signed)
Patient returned call to the office-patient has been scheduled for a pre visit on 06/17/2019 at 1:30 pm; patient has also been scheduled for a direct colon on 06/26/2019 at 9:00 am; patient was advised to call back to the office should questions/concerns arise; patient verbalized understanding of information/instructions;

## 2019-06-17 ENCOUNTER — Telehealth: Payer: Self-pay | Admitting: Family Medicine

## 2019-06-17 ENCOUNTER — Ambulatory Visit (AMBULATORY_SURGERY_CENTER): Payer: Self-pay

## 2019-06-17 ENCOUNTER — Other Ambulatory Visit: Payer: Self-pay

## 2019-06-17 VITALS — Temp 96.6°F | Ht 63.0 in | Wt 175.0 lb

## 2019-06-17 DIAGNOSIS — Z86010 Personal history of colonic polyps: Secondary | ICD-10-CM

## 2019-06-17 DIAGNOSIS — Z8371 Family history of colonic polyps: Secondary | ICD-10-CM

## 2019-06-17 NOTE — Telephone Encounter (Signed)
Medication Refill - Medication: FLUoxetine (PROZAC) 20 MG tablet  Has the patient contacted their pharmacy? Yes - states she was supposed to have this refilled at last OV, but there is nothing for her at the pharmacy (Agent: If no, request that the patient contact the pharmacy for the refill.) (Agent: If yes, when and what did the pharmacy advise?)  Preferred Pharmacy (with phone number or street name):  [facility] [address], [place] [phone] (Phone) [phone] (Fax)     Agent: Please be advised that RX refills may take up to 3 business days. We ask that you follow-up with your pharmacy.

## 2019-06-17 NOTE — Telephone Encounter (Signed)
Markle to check on order for Fluoxetine that was sent on 05/29/19, # 90; RF x 3.  Spoke with El[facility].  Confirmed that [facility] has the order.  Call placed to pt. To inform that there are refills on file at the pharmacy for Fluoxetine.  Pt. Verb. Understanding.  Denied any other needs at this time.

## 2019-06-17 NOTE — Progress Notes (Signed)
Per pt, no allergies to soy or egg products.Pt not taking any weight loss meds or using  O2 at home.  Pt denies any sedation problems.  Emmi info given to pt.

## 2019-06-18 ENCOUNTER — Encounter: Payer: PPO | Admitting: Family Medicine

## 2019-06-20 ENCOUNTER — Encounter: Payer: Self-pay | Admitting: Gastroenterology

## 2019-06-25 ENCOUNTER — Telehealth: Payer: Self-pay

## 2019-06-25 NOTE — Telephone Encounter (Signed)
Covid-19 screening questions   Do you now or have you had a fever in the last 14 days?  Do you have any respiratory symptoms of shortness of breath or cough now or in the last 14 days?  Do you have any family members or close contacts with diagnosed or suspected Covid-19 in the past 14 days?  Have you been tested for Covid-19 and found to be positive?       

## 2019-06-26 ENCOUNTER — Other Ambulatory Visit: Payer: Self-pay

## 2019-06-26 ENCOUNTER — Encounter: Payer: Self-pay | Admitting: Gastroenterology

## 2019-06-26 ENCOUNTER — Ambulatory Visit (AMBULATORY_SURGERY_CENTER): Payer: PPO | Admitting: Gastroenterology

## 2019-06-26 VITALS — BP 117/72 | HR 78 | Temp 98.7°F | Resp 11 | Ht 63.0 in | Wt 171.0 lb

## 2019-06-26 DIAGNOSIS — Z86010 Personal history of colonic polyps: Secondary | ICD-10-CM | POA: Diagnosis not present

## 2019-06-26 DIAGNOSIS — K635 Polyp of colon: Secondary | ICD-10-CM

## 2019-06-26 DIAGNOSIS — D125 Benign neoplasm of sigmoid colon: Secondary | ICD-10-CM

## 2019-06-26 DIAGNOSIS — Z1211 Encounter for screening for malignant neoplasm of colon: Secondary | ICD-10-CM | POA: Diagnosis not present

## 2019-06-26 DIAGNOSIS — Z800 Family history of malignant neoplasm of digestive organs: Secondary | ICD-10-CM | POA: Diagnosis not present

## 2019-06-26 MED ORDER — SODIUM CHLORIDE 0.9 % IV SOLN: 500.0000 mL | Freq: Once | INTRAVENOUS | Status: DC

## 2019-06-26 NOTE — Op Note (Signed)
[facility] [name] Procedure Date: 06/26/2019 8:40 AM MRN: [id number] Endoscopist: Jackquline Denmark , MD Age: 63 Referring MD:  Date of Birth: 09-01-1956 Gender: Female Account #: [id number] Procedure:                Colonoscopy Indications:              High risk colon cancer surveillance: Personal                            history of colonic polyps. Family history of                            colonic polyps (dad) Medicines:                Monitored Anesthesia Care Procedure:                Pre-Anesthesia Assessment:                           - Prior to the procedure, a History and Physical                            was performed, and patient medications and                            allergies were reviewed. The patient's tolerance of                            previous anesthesia was also reviewed. The risks                            and benefits of the procedure and the sedation                            options and risks were discussed with the patient.                            All questions were answered, and informed consent                            was obtained. Prior Anticoagulants: The patient has                            taken no previous anticoagulant or antiplatelet                            agents. ASA Grade Assessment: II - A patient with                            mild systemic disease. After reviewing the risks                            and benefits, the patient was deemed in  satisfactory condition to undergo the procedure.                           After obtaining informed consent, the colonoscope                            was passed under direct vision. Throughout the                            procedure, the patient's blood pressure, pulse, and                            oxygen saturations were monitored continuously. The                            Colonoscope was introduced through the anus and                         advanced to the 1 cm into the ileum. The                            colonoscopy was somewhat difficult due to poor                            bowel prep. Successful completion of the procedure                            was aided by lavage. There was some retained solid                            stool and vegetable material. Despite aggressive                            suctioning and aspiration only appox 75% the                            colonic mucosa was visualized satisfactorily. Of                            note the smaller flat lesions could have been                            missed. The patient tolerated the procedure well.                            The quality of the bowel preparation was adequate                            to identify polyps 6 mm and larger in size. The                            terminal ileum, ileocecal valve, appendiceal  orifice, and rectum were photographed. Scope In: 8:43:08 AM Scope Out: 9:12:11 AM Scope Withdrawal Time: 0 hours 13 minutes 13 seconds  Total Procedure Duration: 0 hours 29 minutes 3 seconds  Findings:                 A 6 mm polyp was found in the proximal sigmoid                            colon. The polyp was sessile. The polyp was removed                            with a cold snare. Resection and retrieval were                            complete. Estimated blood loss: none.                           A few small-mouthed diverticula were found in the                            sigmoid colon.                           Non-bleeding internal hemorrhoids were found during                            retroflexion. The hemorrhoids were small.                           The terminal ileum appeared normal.                           The exam was otherwise without abnormality. Complications:            No immediate complications. Estimated Blood Loss:     Estimated blood loss: none. Impression:                -Colonic polyp S/P polypectomy.                           -Mild sigmoid diverticulosis.                           -Otherwise grossly normal colonoscopy. Limited due                            to quality of preparation. Recommendation:           - Patient has a contact number available for                            emergencies. The signs and symptoms of potential                            delayed complications were discussed with the  patient. Return to normal activities tomorrow.                            Written discharge instructions were provided to the                            patient.                           - High fiber diet.                           - Continue present medications.                           - Await pathology results.                           - Repeat colonoscopy in 1 year for surveillance                            after 2-day prep.                           - Return to GI office PRN. Jackquline Denmark, MD 06/26/2019 9:19:04 AM This report has been signed electronically.

## 2019-06-26 NOTE — Progress Notes (Signed)
To PACU, VSS. Report to [email or web address] 

## 2019-06-26 NOTE — Progress Notes (Signed)
Called to room to assist during endoscopic procedure.  Patient ID and intended procedure confirmed with present staff. Received instructions for my participation in the procedure from the performing physician.  

## 2019-06-26 NOTE — Patient Instructions (Signed)
Handouts Provided:  Polyps  YOU HAD AN ENDOSCOPIC PROCEDURE TODAY AT THE [facility]:   Refer to the procedure report that was given to you for any specific questions about what was found during the examination.  If the procedure report does not answer your questions, please call your gastroenterologist to clarify.  If you requested that your care partner not be given the details of your procedure findings, then the procedure report has been included in a sealed envelope for you to review at your convenience later.  YOU SHOULD EXPECT: Some feelings of bloating in the abdomen. Passage of more gas than usual.  Walking can help get rid of the air that was put into your GI tract during the procedure and reduce the bloating. If you had a lower endoscopy (such as a colonoscopy or flexible sigmoidoscopy) you may notice spotting of blood in your stool or on the toilet paper. If you underwent a bowel prep for your procedure, you may not have a normal bowel movement for a few days.  Please Note:  You might notice some irritation and congestion in your nose or some drainage.  This is from the oxygen used during your procedure.  There is no need for concern and it should clear up in a day or so.  SYMPTOMS TO REPORT IMMEDIATELY:   Following lower endoscopy (colonoscopy or flexible sigmoidoscopy):  Excessive amounts of blood in the stool  Significant tenderness or worsening of abdominal pains  Swelling of the abdomen that is new, acute  Fever of 100F or higher  For urgent or emergent issues, a gastroenterologist can be reached at any hour by calling [phone].   DIET:  We do recommend a small meal at first, but then you may proceed to your regular diet.  Drink plenty of fluids but you should avoid alcoholic beverages for 24 hours.  ACTIVITY:  You should plan to take it easy for the rest of today and you should NOT DRIVE or use heavy machinery until tomorrow (because of the sedation  medicines used during the test).    FOLLOW UP: Our staff will call the number listed on your records 48-72 hours following your procedure to check on you and address any questions or concerns that you may have regarding the information given to you following your procedure. If we do not reach you, we will leave a message.  We will attempt to reach you two times.  During this call, we will ask if you have developed any symptoms of COVID 19. If you develop any symptoms (ie: fever, flu-like symptoms, shortness of breath, cough etc.) before then, please call [phone].  If you test positive for Covid 19 in the 2 weeks post procedure, please call and report this information to us.    If any biopsies were taken you will be contacted by phone or by letter within the next 1-3 weeks.  Please call us at [phone] if you have not heard about the biopsies in 3 weeks.    SIGNATURES/CONFIDENTIALITY: You and/or your care partner have signed paperwork which will be entered into your electronic medical record.  These signatures attest to the fact that that the information above on your After Visit Summary has been reviewed and is understood.  Full responsibility of the confidentiality of this discharge information lies with you and/or your care-partner.  

## 2019-06-28 ENCOUNTER — Telehealth: Payer: Self-pay

## 2019-06-28 ENCOUNTER — Telehealth: Payer: Self-pay | Admitting: *Deleted

## 2019-06-28 NOTE — Telephone Encounter (Signed)
  Follow up Call-  Call back number 06/26/2019  Post procedure Call Back phone  # [phone]  Permission to leave phone message Yes  Some recent data might be hidden     Patient questions:  Do you have a fever, pain , or abdominal swelling? No. Pain Score  0 *  Have you tolerated food without any problems? Yes.    Have you been able to return to your normal activities? Yes.    Do you have any questions about your discharge instructions: Diet   No. Medications  No. Follow up visit  No.  Do you have questions or concerns about your Care? No.  Actions: * If pain score is 4 or above: No action needed, pain <4. 1. Have you developed a fever since your procedure? no  2.   Have you had an respiratory symptoms (SOB or cough) since your procedure? no  3.   Have you tested positive for COVID 19 since your procedure no  4.   Have you had any family members/close contacts diagnosed with the COVID 19 since your procedure?  no   If yes to any of these questions please route to Joylene John, RN and Alphonsa Gin, Therapist, sports.

## 2019-06-28 NOTE — Telephone Encounter (Signed)
  Follow up Call-  Call back number 06/26/2019  Post procedure Call Back phone  # [phone]  Permission to leave phone message Yes  Some recent data might be hidden     Patient questions:  Do you have a fever, pain , or abdominal swelling? No. Pain Score  0 *  Have you tolerated food without any problems? Yes.    Have you been able to return to your normal activities? Yes.    Do you have any questions about your discharge instructions: Diet   No. Medications  No. Follow up visit  No.  Do you have questions or concerns about your Care? No.  Actions: * If pain score is 4 or above: No action needed, pain <4.  1. Have you developed a fever since your procedure? no  2.   Have you had an respiratory symptoms (SOB or cough) since your procedure? no  3.   Have you tested positive for COVID 19 since your procedure no  4.   Have you had any family members/close contacts diagnosed with the COVID 19 since your procedure?  no   If yes to any of these questions please route to Joylene John, RN and Alphonsa Gin, Therapist, sports.

## 2019-07-14 ENCOUNTER — Encounter: Payer: Self-pay | Admitting: Gastroenterology

## 2019-07-31 ENCOUNTER — Encounter: Payer: Self-pay | Admitting: Family Medicine

## 2019-07-31 ENCOUNTER — Ambulatory Visit (INDEPENDENT_AMBULATORY_CARE_PROVIDER_SITE_OTHER): Payer: PPO | Admitting: Family Medicine

## 2019-07-31 DIAGNOSIS — M170 Bilateral primary osteoarthritis of knee: Secondary | ICD-10-CM

## 2019-07-31 DIAGNOSIS — M222X1 Patellofemoral disorders, right knee: Secondary | ICD-10-CM | POA: Diagnosis not present

## 2019-07-31 DIAGNOSIS — M222X2 Patellofemoral disorders, left knee: Secondary | ICD-10-CM

## 2019-07-31 MED ORDER — DICLOFENAC SODIUM 75 MG PO TBEC: 75.0000 mg | DELAYED_RELEASE_TABLET | Freq: Two times a day (BID) | ORAL | 5 refills | Status: DC

## 2019-07-31 NOTE — Progress Notes (Signed)
Virtual Visit via Video Note  Subjective  CC:  Chief Complaint  Patient presents with  . Osteoarthritis    She report that she had been taking Diclofenac for pain and reports she has not had in 5 days. Having pain and wants refill    I connected with Sarah Walsh on 07/31/19 at 11:00 AM EDT by a video enabled telemedicine application and verified that I am speaking with the correct person using two identifiers. Location patient: Home Location provider: [facility] [place], Office Persons participating in the virtual visit: Sarah Fiedler, MD Sarah Walsh, [place] discussed the limitations of evaluation and management by telemedicine and the availability of in person appointments. The patient expressed understanding and agreed to proceed. HPI: Sarah Walsh is a 63 y.o. female who was contacted today to address the problems listed above in the chief complaint. . Knee pain: longstanding OA and PFS; does well with diclofenac. Has failed glucosamin, chondroitin and tylenol. Has had knee injections as well.  Assessment  1. Primary osteoarthritis of both knees   2. Patellofemoral pain syndrome of both knees      Plan   Knee pain:  Refilled meds with cautions against daily use due to risks. Pt understands and agrees. Accepts risks due to improvement in quality of life with meds.   rec DM f/u this month; due eye exam, flu shot and lab work. Pt will schedule I discussed the assessment and treatment plan with the patient. The patient was provided an opportunity to ask questions and all were answered. The patient agreed with the plan and demonstrated an understanding of the instructions.   The patient was advised to call back or seek an in-person evaluation if the symptoms worsen or if the condition fails to improve as anticipated. Follow up: dm f/u with labs.  11/06/2019  No orders of the defined types were placed in this encounter.     I  reviewed the patients updated PMH, FH, and SocHx.    Patient Active Problem List   Diagnosis Date Noted  . Colon polyp 05/29/2019  . Controlled type 2 diabetes mellitus without complication, without long-term current use of insulin ([place]) 03/22/2019  . Patellofemoral pain syndrome of both knees 07/04/2018  . Lower extremity edema 03/03/2017  . Vitamin D deficiency 11/23/2015  . Combined hyperlipidemia associated with type 2 diabetes mellitus ([place]) 10/22/2015  . Primary osteoarthritis of both knees 10/22/2015  . Statin intolerance 10/22/2015  . Essential hypertension 11/05/2008   Current Meds  Medication Sig  . Ascorbic Acid (VITAMIN C) 1000 MG tablet Take 1,000 mg by mouth daily.  . Biotin 1000 MCG tablet Take 1,000 mcg by mouth 2 (two) times daily.   . calcium citrate (CALCITRATE - DOSED IN MG ELEMENTAL CALCIUM) 950 MG tablet Take 200 mg of elemental calcium by mouth daily.  . Cholecalciferol (D3-1000) 1000 units tablet Take 1,000 Units by mouth daily.  Marland Kitchen FLUoxetine (PROZAC) 20 MG tablet Take 1 tablet (20 mg total) by mouth daily.  . hydrochlorothiazide (MICROZIDE) 12.5 MG capsule Take 1 capsule (12.5 mg total) by mouth daily as needed.  . magnesium oxide (MAG-OX) 400 MG tablet Take 400 mg by mouth daily.  . metFORMIN (GLUCOPHAGE) 500 MG tablet Take 1 tablet (500 mg total) by mouth 2 (two) times daily with a meal.  . OMEGA-3 KRILL OIL PO Take by mouth.  . Potassium 99 MG TABS Take by mouth.  Marland Kitchen  vitamin B-12 (CYANOCOBALAMIN) 500 MCG tablet Take 500 mcg by mouth daily.    Allergies: Patient is allergic to statins. Family History: Patient family history includes Alzheimer's disease in her mother; Colon polyps in her father; Diabetes in her daughter and father. Social History:  Patient  reports that she has quit smoking. She quit after 34.00 years of use. She has never used smokeless tobacco. She reports that she does not drink alcohol or use drugs.  Review of Systems: Constitutional:  Negative for fever malaise or anorexia Cardiovascular: negative for chest pain Respiratory: negative for SOB or persistent cough Gastrointestinal: negative for abdominal pain  OBJECTIVE Vitals: There were no vitals taken for this visit. General: no acute distress , A&Ox3  Sarah Arnt, MD

## 2019-08-08 ENCOUNTER — Other Ambulatory Visit: Payer: PPO

## 2019-08-08 ENCOUNTER — Ambulatory Visit: Payer: PPO

## 2019-08-27 ENCOUNTER — Telehealth: Payer: Self-pay | Admitting: Family Medicine

## 2019-08-27 NOTE — Telephone Encounter (Signed)
Copied from [place] [id number]. Topic: General - Other >> Aug 27, 2019 12:08 PM Rainey Pines A wrote: Patient stated that she was advised to come in for lab work and flu shot. Patient scheduled flu shot however no order for lab work was placed. Please advise

## 2019-08-27 NOTE — Telephone Encounter (Signed)
Pt not due for labs until 11/24/19, not sure who called

## 2019-08-28 ENCOUNTER — Ambulatory Visit (INDEPENDENT_AMBULATORY_CARE_PROVIDER_SITE_OTHER): Payer: PPO

## 2019-08-28 ENCOUNTER — Other Ambulatory Visit: Payer: Self-pay

## 2019-08-28 ENCOUNTER — Encounter: Payer: Self-pay | Admitting: Family Medicine

## 2019-08-28 DIAGNOSIS — Z23 Encounter for immunization: Secondary | ICD-10-CM

## 2019-09-11 ENCOUNTER — Telehealth: Payer: Self-pay | Admitting: Family Medicine

## 2019-09-11 NOTE — Telephone Encounter (Signed)
Paperwork has been filled out and faxed

## 2019-09-11 NOTE — Telephone Encounter (Signed)
See note  Copied from [facility] [place] [id number]. Topic: General - Other >> Sep 11, 2019 10:28 AM [name] wrote: Reason for CRM: pt called and stated that she dropped a form off from [employer]. Pt states that they are still waiting on this. Please advise

## 2019-11-05 ENCOUNTER — Other Ambulatory Visit: Payer: Self-pay

## 2019-11-06 ENCOUNTER — Encounter: Payer: Self-pay | Admitting: Family Medicine

## 2019-11-06 ENCOUNTER — Ambulatory Visit (INDEPENDENT_AMBULATORY_CARE_PROVIDER_SITE_OTHER): Payer: PPO | Admitting: Family Medicine

## 2019-11-06 VITALS — BP 124/72 | HR 82 | Temp 98.1°F | Ht 63.0 in | Wt 168.6 lb

## 2019-11-06 DIAGNOSIS — E2839 Other primary ovarian failure: Secondary | ICD-10-CM

## 2019-11-06 DIAGNOSIS — E782 Mixed hyperlipidemia: Secondary | ICD-10-CM

## 2019-11-06 DIAGNOSIS — Z0000 Encounter for general adult medical examination without abnormal findings: Secondary | ICD-10-CM | POA: Diagnosis not present

## 2019-11-06 DIAGNOSIS — I10 Essential (primary) hypertension: Secondary | ICD-10-CM | POA: Diagnosis not present

## 2019-11-06 DIAGNOSIS — M79604 Pain in right leg: Secondary | ICD-10-CM

## 2019-11-06 DIAGNOSIS — Z1159 Encounter for screening for other viral diseases: Secondary | ICD-10-CM | POA: Diagnosis not present

## 2019-11-06 DIAGNOSIS — E559 Vitamin D deficiency, unspecified: Secondary | ICD-10-CM | POA: Diagnosis not present

## 2019-11-06 DIAGNOSIS — Z789 Other specified health status: Secondary | ICD-10-CM

## 2019-11-06 DIAGNOSIS — E1169 Type 2 diabetes mellitus with other specified complication: Secondary | ICD-10-CM | POA: Diagnosis not present

## 2019-11-06 DIAGNOSIS — Z23 Encounter for immunization: Secondary | ICD-10-CM | POA: Diagnosis not present

## 2019-11-06 DIAGNOSIS — E119 Type 2 diabetes mellitus without complications: Secondary | ICD-10-CM

## 2019-11-06 DIAGNOSIS — M222X2 Patellofemoral disorders, left knee: Secondary | ICD-10-CM | POA: Diagnosis not present

## 2019-11-06 DIAGNOSIS — M79605 Pain in left leg: Secondary | ICD-10-CM | POA: Diagnosis not present

## 2019-11-06 DIAGNOSIS — M222X1 Patellofemoral disorders, right knee: Secondary | ICD-10-CM | POA: Diagnosis not present

## 2019-11-06 DIAGNOSIS — Z1231 Encounter for screening mammogram for malignant neoplasm of breast: Secondary | ICD-10-CM

## 2019-11-06 DIAGNOSIS — Z0001 Encounter for general adult medical examination with abnormal findings: Secondary | ICD-10-CM

## 2019-11-06 DIAGNOSIS — F329 Major depressive disorder, single episode, unspecified: Secondary | ICD-10-CM

## 2019-11-06 DIAGNOSIS — R600 Localized edema: Secondary | ICD-10-CM

## 2019-11-06 DIAGNOSIS — M170 Bilateral primary osteoarthritis of knee: Secondary | ICD-10-CM

## 2019-11-06 LAB — CBC WITH DIFFERENTIAL/PLATELET
Basophils Absolute: 0.1 10*3/uL (ref 0.0–0.1)
Basophils Relative: 1.2 % (ref 0.0–3.0)
Eosinophils Absolute: 0.7 10*3/uL (ref 0.0–0.7)
Eosinophils Relative: 9.4 % — ABNORMAL HIGH (ref 0.0–5.0)
HCT: 41.2 % (ref 36.0–46.0)
Hemoglobin: 13.8 g/dL (ref 12.0–15.0)
Lymphocytes Relative: 34.8 % (ref 12.0–46.0)
Lymphs Abs: 2.7 10*3/uL (ref 0.7–4.0)
MCHC: 33.4 g/dL (ref 30.0–36.0)
MCV: 89 fl (ref 78.0–100.0)
Monocytes Absolute: 0.5 10*3/uL (ref 0.1–1.0)
Monocytes Relative: 6 % (ref 3.0–12.0)
Neutro Abs: 3.7 10*3/uL (ref 1.4–7.7)
Neutrophils Relative %: 48.6 % (ref 43.0–77.0)
Platelets: 298 10*3/uL (ref 150.0–400.0)
RBC: 4.63 Mil/uL (ref 3.87–5.11)
RDW: 13.4 % (ref 11.5–15.5)
WBC: 7.7 10*3/uL (ref 4.0–10.5)

## 2019-11-06 LAB — COMPREHENSIVE METABOLIC PANEL
ALT: 11 U/L (ref 0–35)
AST: 11 U/L (ref 0–37)
Albumin: 4.3 g/dL (ref 3.5–5.2)
Alkaline Phosphatase: 46 U/L (ref 39–117)
BUN: 13 mg/dL (ref 6–23)
CO2: 30 mEq/L (ref 19–32)
Calcium: 9.5 mg/dL (ref 8.4–10.5)
Chloride: 100 mEq/L (ref 96–112)
Creatinine, Ser: 0.66 mg/dL (ref 0.40–1.20)
GFR: 89.68 mL/min (ref >60.00)
Glucose, Bld: 94 mg/dL (ref 70–99)
Potassium: 3.9 mEq/L (ref 3.5–5.1)
Sodium: 136 mEq/L (ref 135–145)
Total Bilirubin: 0.6 mg/dL (ref 0.2–1.2)
Total Protein: 7.6 g/dL (ref 6.0–8.3)

## 2019-11-06 LAB — MICROALBUMIN / CREATININE URINE RATIO
Creatinine,U: 133.6 mg/dL
Microalb Creat Ratio: 0.7 mg/g (ref 0.0–30.0)
Microalb, Ur: 1 mg/dL (ref 0.0–1.9)

## 2019-11-06 LAB — TSH: TSH: 2.57 u[IU]/mL (ref 0.35–4.50)

## 2019-11-06 LAB — POCT GLYCOSYLATED HEMOGLOBIN (HGB A1C): Hemoglobin A1C: 5.5 % (ref 4.0–5.6)

## 2019-11-06 LAB — LIPID PANEL
Cholesterol: 256 mg/dL — ABNORMAL HIGH (ref 0–200)
HDL: 46.7 mg/dL (ref >39.00)
LDL Cholesterol: 179 mg/dL — ABNORMAL HIGH (ref 0–99)
NonHDL: 209.36
Total CHOL/HDL Ratio: 5
Triglycerides: 150 mg/dL — ABNORMAL HIGH (ref 0.0–149.0)
VLDL: 30 mg/dL (ref 0.0–40.0)

## 2019-11-06 LAB — VITAMIN D 25 HYDROXY (VIT D DEFICIENCY, FRACTURES): VITD: 34.15 ng/mL (ref 30.00–100.00)

## 2019-11-06 LAB — MAGNESIUM: Magnesium: 2.3 mg/dL (ref 1.5–2.5)

## 2019-11-06 LAB — B12 AND FOLATE PANEL
Folate: 9.4 ng/mL (ref >5.9)
Vitamin B-12: 902 pg/mL (ref 211–911)

## 2019-11-06 MED ORDER — ROSUVASTATIN CALCIUM 5 MG PO TABS: 5.0000 mg | ORAL_TABLET | ORAL | 3 refills | Status: DC

## 2019-11-06 NOTE — Progress Notes (Signed)
Subjective  Chief Complaint  Patient presents with  . Annual Exam  . Bilateral leg pain and weakness  . Diabetes    HPI: Sarah Walsh is a 64 y.o. female who presents to [place] [facility] today for a Female Wellness Visit. She also has the concerns and/or needs as listed above in the chief complaint. These will be addressed in addition to the Health Maintenance Visit.   Wellness Visit: annual visit with health maintenance review and exam without Pap   Health maintenance: Patient is due for mammogram and bone density testing.  She continues to struggle to find time in her schedule to get these done.  I encouraged both.  She is due for an eye exam as well.  Second Pneumovax given today.  This will complete her pneumococcal series.  Other immunizations are all up-to-date.  She is eating better and has lost some weight.  Feels well.  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  It is enjoyable.  It is physically demanding. Chronic disease f/u and/or acute problem visit: (deemed necessary to be done in addition to the wellness visit):  Diabetes follow up: Her diabetic control is reported as Improved.  She is eating better.  No symptoms of hyperglycemia. She denies exertional CP or SOB or symptomatic hypoglycemia. She denies foot sores or paresthesias.  Due for urine testing.  She is not on an ACE inhibitor.  She does not tolerate statins although we have not tried intermittent dosing.  She is overdue for an eye exam.  No history of complications.  She only takes low-dose Metformin  Hypertension and hyperlipidemia: Blood pressures been well controlled on low-dose hydrochlorothiazide.  No chest pain or shortness of breath.  She would be willing to try a statin twice weekly with [id number].  Knee pain: Persists due to arthritis and patellofemoral syndrome.  She uses Voltaren gel and CBD oil.  She also use diclofenac..   This does resolve pain.  She has had injections in the past without much benefit.  No new symptoms.  Reactive depression: Has been on Prozac intermittently over the years.  We restarted this last year.  She maintains 20 mg daily and this is much improving her mood.  No longer feeling down.  Irritability is controlled.  No adverse effects.  Chronic bilateral leg pain: She describes aching in legs at times severe interfering with activities.  She feels the pain is deep in the bone.  It is not localized.  Not associated with joint pain, weakness, swelling.  Worse after prolonged standing.  No back pain.  No upper extremity pain.  No torso pain.  No history of fibromyalgia.  No claudication symptoms  Immunization History  Administered Date(s) Administered  . Fluad Quad(high Dose 65+) 08/28/2019  . Influenza, Seasonal, Injecte, Preservative Fre 10/22/2015, 07/15/2016  . Influenza,inj,Quad PF,6+ Mos 09/29/2017, 07/04/2018  . Pneumococcal Conjugate-13 03/27/2019  . Pneumococcal Polysaccharide-23 10/22/2015  . Tdap 11/22/2010  . Zoster 11/23/2015  . Zoster Recombinat (Shingrix) 05/18/2018    Diabetes Related Lab Review: Lab Results  Component Value Date   HGBA1C 5.5 11/06/2019   HGBA1C 6.2 (A) 03/25/2019   HGBA1C 6.8 (A) 11/26/2018    Lab Results  Component Value Date   MICROALBUR <0.7 05/18/2018   Lab Results  Component Value Date   CREATININE 0.67 05/18/2018   BUN 11 05/18/2018   NA 138 05/18/2018   K  4.5 05/18/2018   CL 101 05/18/2018   CO2 28 05/18/2018   Lab Results  Component Value Date   CHOL 184 05/18/2018   CHOL 226 (H) 01/08/2008   Lab Results  Component Value Date   HDL 46.30 05/18/2018   HDL 38 (L) 01/08/2008   Lab Results  Component Value Date   LDLCALC 98 05/18/2018   LDLCALC 158 (H) 01/08/2008   Lab Results  Component Value Date   TRIG 199.0 (H) 05/18/2018   TRIG 151 (H) 01/08/2008   Lab Results  Component Value Date   CHOLHDL 4 05/18/2018   CHOLHDL  5.9 Ratio 01/08/2008   No results found for: LDLDIRECT The 10-year ASCVD risk score Mikey Bussing DC Jr., et al., 2013) is: 13.5%   Values used to calculate the score:     Age: 59 years     Sex: Female     Is Non-Hispanic African American: No     Diabetic: Yes     Tobacco smoker: No     Systolic Blood Pressure: 301 mmHg     Is BP treated: Yes     HDL Cholesterol: 46.3 mg/dL     Total Cholesterol: 184 mg/dL  BP Readings from Last 3 Encounters:  11/06/19 124/72  06/26/19 117/72  05/29/19 124/82   Wt Readings from Last 3 Encounters:  11/06/19 168 lb 9.6 oz (76.5 kg)  06/26/19 171 lb (77.6 kg)  06/17/19 175 lb (79.4 kg)    Health Maintenance  Topic Date Due  . Hepatitis C Screening  [birth year]  . MAMMOGRAM  03/13/2018  . DEXA SCAN  02/16/2019  . URINE MICROALBUMIN  05/19/2019  . FOOT EXAM  09/13/2019  . OPHTHALMOLOGY EXAM  11/06/2019 (Originally 02/16/1966)  . HEMOGLOBIN A1C  05/05/2020  . COLONOSCOPY  06/25/2020  . PNA vac Low Risk Adult (2 of 2 - PPSV23) 10/21/2020  . TETANUS/TDAP  11/22/2020  . INFLUENZA VACCINE  Completed  . PAP SMEAR-Modifier  Discontinued  . HIV Screening  Discontinued     Assessment  1. Annual physical exam   2. Controlled type 2 diabetes mellitus without complication, without long-term current use of insulin ([place])   3. Lower extremity edema   4. Combined hyperlipidemia associated with type 2 diabetes mellitus ([place])   5. Essential hypertension   6. Statin intolerance   7. Primary osteoarthritis of both knees   8. Leg pain, bilateral   9. Vitamin D deficiency   10. Patellofemoral pain syndrome of both knees   11. Hypoestrogenism   12. Encounter for screening mammogram for breast cancer   13. Need for hepatitis C screening test   14. Reactive depression      Plan  Female Wellness Visit:  Age appropriate Health Maintenance and Prevention measures were discussed with patient. Included topics are cancer screening recommendations, ways to keep  healthy (see AVS) including dietary and exercise recommendations, regular eye and dental care, use of seat belts, and avoidance of moderate alcohol use and tobacco use.  Recommend mammogram, bone density screening and she will be due for a repeat colonoscopy due to colon polyps this year in September  BMI: discussed patient's BMI and encouraged positive lifestyle modifications to help get to or maintain a target BMI.  HM needs and immunizations were addressed and ordered. See below for orders. See HM and immunization section for updates.  Pneumovax given today  Routine labs and screening tests ordered including cmp, cbc and lipids where appropriate.  Discussed recommendations regarding Vit D and  calcium supplementation (see AVS)  Do Medicare annual wellness visit  Chronic disease management visit and/or acute problem visit:  Diabetes is very well controlled.  A1c is now in the normal range.  Will recheck in 6 months.  If maintains at this level will stop the Metformin.  Check urine microalbuminuria today.  Consider low-dose ACE inhibitor but may be able to defer if she has reversed her diabetes.  Continue healthy diet.  Immunizations updated.  Recommend eye exam  Diabetes with hyperlipidemia: Recheck lipids today and will try to start statin twice weekly to see if tolerated  Hypertension is well controlled on minimal medications.  Diuretic use to help with dependent edema that is mild.  She is on an ACE inhibitor.  See above.  Reactive depression is now well controlled on Prozac.  Continue indefinitely.  Bilateral leg pain: Could be due to vitamin deficiency.  Check vitamin D and other lab work today.  Including thyroid.  No red flags identified.  Normal neuro and musculoskeletal exam today normal pedal pulses  Follow up: Return in about 6 months (around 05/05/2020) for follow up of diabetes and hypertension.  Orders Placed This Encounter  Procedures  . Dexa BC  . mammo BC  . CBC w/Diff   . CMP  . Hep Cab  . TSH  . Urine MAC  . Vit D 25OH  . B12 and Folate  . Lipids  . Magnesium  . POCT HgB A1C   No orders of the defined types were placed in this encounter.     Lifestyle: Body mass index is 29.87 kg/m. Wt Readings from Last 3 Encounters:  11/06/19 168 lb 9.6 oz (76.5 kg)  06/26/19 171 lb (77.6 kg)  06/17/19 175 lb (79.4 kg)    Patient Active Problem List   Diagnosis Date Noted  . Controlled type 2 diabetes mellitus without complication, without long-term current use of insulin ([place]) 03/22/2019    Priority: High  . Combined hyperlipidemia associated with type 2 diabetes mellitus ([place]) 10/22/2015    Priority: High  . Essential hypertension 11/05/2008    Priority: High    Qualifier: Diagnosis of  By: Wynetta Emery RN, Doroteo Bradford     . Reactive depression 11/06/2019    Priority: Medium  . Colon polyp 05/29/2019    Priority: Medium  . Patellofemoral pain syndrome of both knees 07/04/2018    Priority: Medium  . Lower extremity edema 03/03/2017    Priority: Medium  . Primary osteoarthritis of both knees 10/22/2015    Priority: Medium  . Vitamin D deficiency 11/23/2015    Priority: Low  . Statin intolerance 10/22/2015    Priority: Low   Health Maintenance  Topic Date Due  . Hepatitis C Screening  02/20/1956  . MAMMOGRAM  03/13/2018  . DEXA SCAN  02/16/2019  . URINE MICROALBUMIN  05/19/2019  . FOOT EXAM  09/13/2019  . OPHTHALMOLOGY EXAM  11/06/2019 (Originally 02/16/1966)  . HEMOGLOBIN A1C  05/05/2020  . COLONOSCOPY  06/25/2020  . PNA vac Low Risk Adult (2 of 2 - PPSV23) 10/21/2020  . TETANUS/TDAP  11/22/2020  . INFLUENZA VACCINE  Completed  . PAP SMEAR-Modifier  Discontinued  . HIV Screening  Discontinued   Immunization History  Administered Date(s) Administered  . Fluad Quad(high Dose 65+) 08/28/2019  . Influenza, Seasonal, Injecte, Preservative Fre 10/22/2015, 07/15/2016  . Influenza,inj,Quad PF,6+ Mos 09/29/2017, 07/04/2018  . Pneumococcal  Conjugate-13 03/27/2019  . Pneumococcal Polysaccharide-23 10/22/2015  . Tdap 11/22/2010  . Zoster 11/23/2015  .  Zoster Recombinat (Shingrix) 05/18/2018   We updated and reviewed the patient's past history in detail and it is documented below. Allergies: Patient is allergic to statins. Past Medical History Patient  has a past medical history of Anxiety, Colon polyp (05/29/2019), Depression, Diabetes mellitus without complication ([place]), Hyperlipemia, and Hypertension. Past Surgical History Patient  has a past surgical history that includes Cesarean section and Abdominal hysterectomy. Family History: Patient family history includes Alzheimer's disease in her mother; Colon polyps in her father; Diabetes in her daughter and father. Social History:  Patient  reports that she has quit smoking. She quit after 34.00 years of use. She has never used smokeless tobacco. She reports that she does not drink alcohol or use drugs.  Review of Systems: Constitutional: negative for fever or malaise Ophthalmic: negative for photophobia, double vision or loss of vision Cardiovascular: negative for chest pain, dyspnea on exertion, or new LE swelling Respiratory: negative for SOB or persistent cough Gastrointestinal: negative for abdominal pain, change in bowel habits or melena Genitourinary: negative for dysuria or gross hematuria, no abnormal uterine bleeding or disharge Musculoskeletal: negative for new gait disturbance or muscular weakness Integumentary: negative for new or persistent rashes, no breast lumps Neurological: negative for TIA or stroke symptoms Psychiatric: negative for SI or delusions Allergic/Immunologic: negative for hives  Patient Care Team    Relationship Specialty Notifications Start End  Leamon Arnt, MD PCP - General Family Medicine  05/18/18     Objective  Vitals: BP 124/72 (BP Location: Right Arm, Patient Position: Sitting, Cuff Size: Normal)   Pulse 82   Temp 98.1 F (36.7  C) (Temporal)   Ht _0  (1.6 m)   Wt 168 lb 9.6 oz (76.5 kg)   SpO2 98%   BMI 29.87 kg/m  General:  Well developed, well nourished, no acute distress  Psych:  Alert and orientedx3,normal mood and affect HEENT:  Normocephalic, atraumatic, non-icteric sclera, PERRL, oropharynx is clear without mass or exudate, supple neck without adenopathy, mass or thyromegaly Cardiovascular:  Normal S1, S2, RRR without gallop, rub, +2 systolic murmur present Respiratory:  Good breath sounds bilaterally, CTAB with normal respiratory effort Gastrointestinal: normal bowel sounds, soft, non-tender, no noted masses. No HSM MSK: no deformities, contusions. Joints are without erythema or swelling.  No leg tenderness spine and CVA region are nontender Skin:  Warm, no rashes or suspicious lesions noted Neurologic:    Mental status is normal. CN 2-11 are normal. Gross motor and sensory exams are normal. Normal gait. No tremor Breast Exam: Bilateral breast implants present limiting exam.  No mass, skin retraction or nipple discharge is appreciated in either breast. No axillary adenopathy. Fibrocystic changes are not noted Diabetic Foot Exam: Appearance - no lesions, ulcers or calluses Skin - no sigificant pallor or erythema Monofilament testing - sensitive bilaterally in following locations:  Right - Great toe, medial, central, lateral ball and posterior foot intact  Left - Great toe, medial, central, lateral ball and posterior foot intact Pulses - +2 distally bilaterally    Commons side effects, risks, benefits, and alternatives for medications and treatment plan prescribed today were discussed, and the patient expressed understanding of the given instructions. Patient is instructed to call or message via MyChart if he/she has any questions or concerns regarding our treatment plan. No barriers to understanding were identified. We discussed Red Flag symptoms and signs in detail. Patient expressed understanding  regarding what to do in case of urgent or emergency type symptoms.   Medication list  was reconciled, printed and provided to the patient in AVS. Patient instructions and summary information was reviewed with the patient as documented in the AVS. This note was prepared with assistance of Dragon voice recognition software. Occasional wrong-word or sound-a-like substitutions may have occurred due to the inherent limitations of voice recognition software  This visit occurred during the SARS-CoV-2 public health emergency.  Safety protocols were in place, including screening questions prior to the visit, additional usage of staff PPE, and extensive cleaning of exam room while observing appropriate contact time as indicated for disinfecting solutions.

## 2019-11-06 NOTE — Addendum Note (Signed)
Addended by: Billey Chang on: 11/06/2019 12:42 PM   Modules accepted: Orders

## 2019-11-06 NOTE — Addendum Note (Signed)
Addended bySerita Sheller on: 11/06/2019 09:40 AM   Modules accepted: Orders

## 2019-11-06 NOTE — Patient Instructions (Signed)
Please return in 6 months to recheck your diabetes and blood pressure and mood.  Sooner if needed to recheck your leg pain.   Medicare recommends an Annual Wellness Visit for all patients. Please schedule this to be done with our Nurse Educator, Loma Sousa. This can be done virtually. This is an informative "talk" visit; it's goals are to ensure that your health care needs are being met and to give you education regarding avoiding falls, ensuring you are not suffering from depression or problems with memory or thinking, and to educate you on Advance Care Planning. It helps me take good care of you!  Please get your mammogram and Bone density testing scheduled. I have reordered both tests. Please set up an appointment for a diabetic eye exam and have the results sent to me.   Today you were given your pneumovax vaccination.   If you have any questions or concerns, please don't hesitate to send me a message via MyChart or call the office at [phone]. Thank you for visiting with [place] today! It's our pleasure caring for you.   Preventive Care 1 Years and Older, Female Preventive care refers to lifestyle choices and visits with your health care provider that can promote health and wellness. This includes:  A yearly physical exam. This is also called an annual well check.  Regular dental and eye exams.  Immunizations.  Screening for certain conditions.  Healthy lifestyle choices, such as diet and exercise. What can I expect for my preventive care visit? Physical exam Your health care provider will check:  Height and weight. These may be used to calculate body mass index (BMI), which is a measurement that tells if you are at a healthy weight.  Heart rate and blood pressure.  Your skin for abnormal spots. Counseling Your health care provider may ask you questions about:  Alcohol, tobacco, and drug use.  Emotional well-being.  Home and relationship well-being.  Sexual  activity.  Eating habits.  History of falls.  Memory and ability to understand (cognition).  Work and work Statistician.  Pregnancy and menstrual history. What immunizations do I need?  Influenza (flu) vaccine  This is recommended every year. Tetanus, diphtheria, and pertussis (Tdap) vaccine  You may need a Td booster every 10 years. Varicella (chickenpox) vaccine  You may need this vaccine if you have not already been vaccinated. Zoster (shingles) vaccine  You may need this after age 54. Pneumococcal conjugate (PCV13) vaccine  One dose is recommended after age 25. Pneumococcal polysaccharide (PPSV23) vaccine  One dose is recommended after age 59. Measles, mumps, and rubella (MMR) vaccine  You may need at least one dose of MMR if you were born in 1957 or later. You may also need a second dose. Meningococcal conjugate (MenACWY) vaccine  You may need this if you have certain conditions. Hepatitis A vaccine  You may need this if you have certain conditions or if you travel or work in places where you may be exposed to hepatitis A. Hepatitis B vaccine  You may need this if you have certain conditions or if you travel or work in places where you may be exposed to hepatitis B. Haemophilus influenzae type b (Hib) vaccine  You may need this if you have certain conditions. You may receive vaccines as individual doses or as more than one vaccine together in one shot (combination vaccines). Talk with your health care provider about the risks and benefits of combination vaccines. What tests do I need? Blood tests  Lipid and cholesterol levels. These may be checked every 5 years, or more frequently depending on your overall health.  Hepatitis C test.  Hepatitis B test. Screening  Lung cancer screening. You may have this screening every year starting at age 38 if you have a 30-pack-year history of smoking and currently smoke or have quit within the past 15  years.  Colorectal cancer screening. All adults should have this screening starting at age 93 and continuing until age 81. Your health care provider may recommend screening at age 61 if you are at increased risk. You will have tests every 1-10 years, depending on your results and the type of screening test.  Diabetes screening. This is done by checking your blood sugar (glucose) after you have not eaten for a while (fasting). You may have this done every 1-3 years.  Mammogram. This may be done every 1-2 years. Talk with your health care provider about how often you should have regular mammograms.  BRCA-related cancer screening. This may be done if you have a family history of breast, ovarian, tubal, or peritoneal cancers. Other tests  Sexually transmitted disease (STD) testing.  Bone density scan. This is done to screen for osteoporosis. You may have this done starting at age 58. Follow these instructions at home: Eating and drinking  Eat a diet that includes fresh fruits and vegetables, whole grains, lean protein, and low-fat dairy products. Limit your intake of foods with high amounts of sugar, saturated fats, and salt.  Take vitamin and mineral supplements as recommended by your health care provider.  Do not drink alcohol if your health care provider tells you not to drink.  If you drink alcohol: ? Limit how much you have to 0-1 drink a day. ? Be aware of how much alcohol is in your drink. In the [place] one drink equals one 12 oz bottle of beer (355 mL), one 5 oz glass of wine (148 mL), or one 1 oz glass of hard liquor (44 mL). Lifestyle  Take daily care of your teeth and gums.  Stay active. Exercise for at least 30 minutes on 5 or more days each week.  Do not use any products that contain nicotine or tobacco, such as cigarettes, e-cigarettes, and chewing tobacco. If you need help quitting, ask your health care provider.  If you are sexually active, practice safe sex. Use a  condom or other form of protection in order to prevent STIs (sexually transmitted infections).  Talk with your health care provider about taking a low-dose aspirin or statin. What's next?  Go to your health care provider once a year for a well check visit.  Ask your health care provider how often you should have your eyes and teeth checked.  Stay up to date on all vaccines. This information is not intended to replace advice given to you by your health care provider. Make sure you discuss any questions you have with your health care provider. Document Revised: 10/04/2018 Document Reviewed: 10/04/2018 Elsevier Patient Education  2020 [employer].

## 2019-11-07 LAB — HEPATITIS C ANTIBODY
Hepatitis C Ab: NONREACTIVE
SIGNAL TO CUT-OFF: 0.01 (ref <1.00)

## 2019-12-26 DIAGNOSIS — E119 Type 2 diabetes mellitus without complications: Secondary | ICD-10-CM | POA: Diagnosis not present

## 2019-12-26 LAB — HM DIABETES EYE EXAM

## 2020-01-09 ENCOUNTER — Ambulatory Visit: Payer: PPO | Attending: Internal Medicine

## 2020-01-09 DIAGNOSIS — Z23 Encounter for immunization: Secondary | ICD-10-CM

## 2020-01-09 NOTE — Progress Notes (Signed)
   Covid-19 Vaccination Clinic  Name:  Sarah Walsh    MRN: [id number] DOB: Jun 01, 1956  01/09/2020  [name] was observed post Covid-19 immunization for 15MIN without incident. She was provided with Vaccine Information Sheet and instruction to access the V-Safe system.   [name] was instructed to call 911 with any severe reactions post vaccine: Marland Kitchen Difficulty breathing  . Swelling of face and throat  . A fast heartbeat  . A bad rash all over body  . Dizziness and weakness   Immunizations Administered    Name Date Dose VIS Date Route   Pfizer COVID-19 Vaccine 01/09/2020  9:13 AM 0.3 mL 10/04/2019 Intramuscular   Manufacturer: [place]   Lot: [id number]   [place]: [id number]

## 2020-01-21 ENCOUNTER — Other Ambulatory Visit: Payer: Self-pay | Admitting: Family Medicine

## 2020-01-21 DIAGNOSIS — Z1231 Encounter for screening mammogram for malignant neoplasm of breast: Secondary | ICD-10-CM

## 2020-01-21 DIAGNOSIS — E2839 Other primary ovarian failure: Secondary | ICD-10-CM

## 2020-02-03 ENCOUNTER — Ambulatory Visit: Payer: PPO | Attending: Internal Medicine

## 2020-02-03 DIAGNOSIS — Z23 Encounter for immunization: Secondary | ICD-10-CM

## 2020-02-03 NOTE — Progress Notes (Signed)
   Covid-19 Vaccination Clinic  Name:  CALYSE GUERRERO    MRN: [id number] DOB: 01/16/1956  02/03/2020  Ms. Pacer was observed post Covid-19 immunization for 15 minutes without incident. She was provided with Vaccine Information Sheet and instruction to access the V-Safe system.   Ms. Hardnett was instructed to call 911 with any severe reactions post vaccine: Marland Kitchen Difficulty breathing  . Swelling of face and throat  . A fast heartbeat  . A bad rash all over body  . Dizziness and weakness   Immunizations Administered    Name Date Dose VIS Date Route   Pfizer COVID-19 Vaccine 02/03/2020  8:05 AM 0.3 mL 10/04/2019 Intramuscular   Manufacturer: [place]   Lot: [id number]   [place]: [id number]

## 2020-02-04 ENCOUNTER — Other Ambulatory Visit: Payer: PPO

## 2020-02-07 ENCOUNTER — Telehealth: Payer: Self-pay | Admitting: Family Medicine

## 2020-02-07 NOTE — Telephone Encounter (Signed)
I left a message asking the patient to call and schedule Medicare AWV with Courtney ([facility] Coach).  If patient calls back, please schedule Medicare Wellness Visit (initial) at next available opening.  VDM (Dee-Dee) 

## 2020-02-10 ENCOUNTER — Other Ambulatory Visit: Payer: Self-pay | Admitting: Family Medicine

## 2020-02-12 ENCOUNTER — Telehealth: Payer: Self-pay

## 2020-02-12 ENCOUNTER — Other Ambulatory Visit: Payer: Self-pay | Admitting: Family Medicine

## 2020-02-12 NOTE — Telephone Encounter (Signed)
error 

## 2020-02-12 NOTE — Telephone Encounter (Signed)
Patient is aware that medication was sent in on 02/10/20 confirmed by [facility] at 1:10 PM.

## 2020-02-12 NOTE — Telephone Encounter (Signed)
Patient is upset because her prescription was denied. diclofenac (VOLTAREN) 75 MG EC tablet Last appt was 11/06/19. Offered patient appt 02/14/20.pt states that she's not able to make the appt. Offered pt virtual and in office and she denied. Pt is requesting a call back from Dr. Jonni Sanger

## 2020-02-19 ENCOUNTER — Encounter: Payer: Self-pay | Admitting: Family Medicine

## 2020-02-19 DIAGNOSIS — M791 Myalgia, unspecified site: Secondary | ICD-10-CM

## 2020-02-19 DIAGNOSIS — T466X5A Adverse effect of antihyperlipidemic and antiarteriosclerotic drugs, initial encounter: Secondary | ICD-10-CM

## 2020-02-24 DIAGNOSIS — T466X5A Adverse effect of antihyperlipidemic and antiarteriosclerotic drugs, initial encounter: Secondary | ICD-10-CM | POA: Insufficient documentation

## 2020-02-24 DIAGNOSIS — M791 Myalgia, unspecified site: Secondary | ICD-10-CM | POA: Insufficient documentation

## 2020-02-28 ENCOUNTER — Encounter: Payer: Self-pay | Admitting: Family Medicine

## 2020-02-28 NOTE — Telephone Encounter (Signed)
I spoke with pt to see when it was faxed over. She says that a friend faxed it over last week. We still have not received fax yet.

## 2020-03-03 NOTE — Telephone Encounter (Signed)
Please print problem list and medication list and fax with the plasma donation form on your desk.  Thanks. Then to be scanned.

## 2020-03-04 ENCOUNTER — Encounter: Payer: Self-pay | Admitting: Family Medicine

## 2020-03-11 ENCOUNTER — Other Ambulatory Visit: Payer: Self-pay | Admitting: Family Medicine

## 2020-03-13 ENCOUNTER — Encounter: Payer: Self-pay | Admitting: Family Medicine

## 2020-03-13 ENCOUNTER — Other Ambulatory Visit: Payer: Self-pay | Admitting: Family Medicine

## 2020-04-06 ENCOUNTER — Encounter: Payer: Self-pay | Admitting: Family Medicine

## 2020-04-06 ENCOUNTER — Other Ambulatory Visit: Payer: Self-pay

## 2020-04-06 ENCOUNTER — Ambulatory Visit (INDEPENDENT_AMBULATORY_CARE_PROVIDER_SITE_OTHER): Payer: PPO | Admitting: Family Medicine

## 2020-04-06 VITALS — BP 126/80 | HR 83 | Temp 97.7°F | Ht 63.0 in | Wt 168.2 lb

## 2020-04-06 DIAGNOSIS — M791 Myalgia, unspecified site: Secondary | ICD-10-CM

## 2020-04-06 DIAGNOSIS — E1169 Type 2 diabetes mellitus with other specified complication: Secondary | ICD-10-CM

## 2020-04-06 DIAGNOSIS — M170 Bilateral primary osteoarthritis of knee: Secondary | ICD-10-CM

## 2020-04-06 DIAGNOSIS — I10 Essential (primary) hypertension: Secondary | ICD-10-CM

## 2020-04-06 DIAGNOSIS — T466X5A Adverse effect of antihyperlipidemic and antiarteriosclerotic drugs, initial encounter: Secondary | ICD-10-CM

## 2020-04-06 DIAGNOSIS — M222X1 Patellofemoral disorders, right knee: Secondary | ICD-10-CM

## 2020-04-06 DIAGNOSIS — E119 Type 2 diabetes mellitus without complications: Secondary | ICD-10-CM | POA: Diagnosis not present

## 2020-04-06 DIAGNOSIS — F329 Major depressive disorder, single episode, unspecified: Secondary | ICD-10-CM | POA: Diagnosis not present

## 2020-04-06 DIAGNOSIS — E782 Mixed hyperlipidemia: Secondary | ICD-10-CM

## 2020-04-06 DIAGNOSIS — M222X2 Patellofemoral disorders, left knee: Secondary | ICD-10-CM | POA: Diagnosis not present

## 2020-04-06 MED ORDER — METFORMIN HCL 500 MG PO TABS: 500.0000 mg | ORAL_TABLET | Freq: Two times a day (BID) | ORAL | 3 refills | Status: DC

## 2020-04-06 MED ORDER — FLUOXETINE HCL 20 MG PO TABS: 20.0000 mg | ORAL_TABLET | Freq: Every day | ORAL | 3 refills | Status: DC

## 2020-04-06 MED ORDER — EZETIMIBE 10 MG PO TABS: 10.0000 mg | ORAL_TABLET | Freq: Every day | ORAL | 3 refills | Status: DC

## 2020-04-06 MED ORDER — DICLOFENAC SODIUM 75 MG PO TBEC: 75.0000 mg | DELAYED_RELEASE_TABLET | Freq: Two times a day (BID) | ORAL | 3 refills | Status: DC

## 2020-04-06 NOTE — Patient Instructions (Addendum)
Please return in 6 months for your annual complete physical; please come fasting.  Please set up an appointment for a diabetic eye exam and have the results sent to me.   I've ordered Zetia to lower your cholesterol. Your bad cholesterol is very high and this medication should not cause you any side effects.   If you have any questions or concerns, please don't hesitate to send me a message via MyChart or call the office at [phone]. Thank you for visiting with Sarah Walsh today! It's our pleasure caring for you.

## 2020-04-06 NOTE — Progress Notes (Signed)
Subjective  CC:  Chief Complaint  Patient presents with  . Medication Refill    Voltaren 75mg   . Diabetes    HPI: Sarah Walsh is a 64 y.o. female who presents to the office today for follow up of diabetes and problems listed above in the chief complaint.   Diabetes follow up: Her diabetic control is reported as Unchanged. Doing fine on low dose metformin.  She denies exertional CP or SOB or symptomatic hypoglycemia. She denies foot sores or paresthesias.   HLD: can't tolerate statins. Hasn't tried zetia. Diet is fair  Knee pain: does fine if takes voltaren bid. Requests refill. Understands risks/benefits.   HTN well controlled on low dose HCTZ.   Wt Readings from Last 3 Encounters:  04/06/20 168 lb 3.2 oz (76.3 kg)  11/06/19 168 lb 9.6 oz (76.5 kg)  06/26/19 171 lb (77.6 kg)    BP Readings from Last 3 Encounters:  04/06/20 126/80  11/06/19 124/72  06/26/19 117/72    Assessment  1. Controlled type 2 diabetes mellitus without complication, without long-term current use of insulin ([place])   2. Essential hypertension   3. Patellofemoral pain syndrome of both knees   4. Primary osteoarthritis of both knees   5. Myalgia due to statin   6. Combined hyperlipidemia associated with type 2 diabetes mellitus ([place])   7. Reactive depression      Plan   Diabetes is currently very well controlled. No changes made today. Reports nl eye exam a few months ago: [place] eye exam.  HTN controlled   HLD: uncontrolled. Trial of zetia and diet changes.   Refilled meds.   Mood is good and well controlled  Follow up: Return in about 6 months (around 10/06/2020) for complete physical, follow up of diabetes and hypertension, follow up hypercholesterolemia.. No orders of the defined types were placed in this encounter.  Meds ordered this encounter  Medications  . diclofenac (VOLTAREN) 75 MG EC tablet    Sig: Take 1 tablet (75 mg total) by mouth 2 (two) times daily.    Dispense:  180  tablet    Refill:  3  . ezetimibe (ZETIA) 10 MG tablet    Sig: Take 1 tablet (10 mg total) by mouth daily.    Dispense:  90 tablet    Refill:  3  . metFORMIN (GLUCOPHAGE) 500 MG tablet    Sig: Take 1 tablet (500 mg total) by mouth 2 (two) times daily with a meal.    Dispense:  180 tablet    Refill:  3    Please keep on file for next refill due August 2021  . FLUoxetine (PROZAC) 20 MG tablet    Sig: Take 1 tablet (20 mg total) by mouth daily.    Dispense:  90 tablet    Refill:  3    Please keep on file for next refill due August 2021      Immunization History  Administered Date(s) Administered  . Fluad Quad(high Dose 65+) 08/28/2019  . Influenza, Seasonal, Injecte, Preservative Fre 10/22/2015, 07/15/2016  . Influenza,inj,Quad PF,6+ Mos 09/29/2017, 07/04/2018  . PFIZER SARS-COV-2 Vaccination 01/09/2020, 02/03/2020  . Pneumococcal Conjugate-13 03/27/2019  . Pneumococcal Polysaccharide-23 10/22/2015, 11/06/2019  . Tdap 11/22/2010  . Zoster 11/23/2015  . Zoster Recombinat (Shingrix) 05/18/2018    Diabetes Related Lab Review: Lab Results  Component Value Date   HGBA1C 5.5 11/06/2019   HGBA1C 6.2 (A) 03/25/2019   HGBA1C 6.8 (A) 11/26/2018    Lab Results  Component Value Date   MICROALBUR 1.0 11/06/2019   Lab Results  Component Value Date   CREATININE 0.66 11/06/2019   BUN 13 11/06/2019   NA 136 11/06/2019   K 3.9 11/06/2019   CL 100 11/06/2019   CO2 30 11/06/2019   Lab Results  Component Value Date   CHOL 256 (H) 11/06/2019   CHOL 184 05/18/2018   CHOL 226 (H) 01/08/2008   Lab Results  Component Value Date   HDL 46.70 11/06/2019   HDL 46.30 05/18/2018   HDL 38 (L) 01/08/2008   Lab Results  Component Value Date   LDLCALC 179 (H) 11/06/2019   LDLCALC 98 05/18/2018   LDLCALC 158 (H) 01/08/2008   Lab Results  Component Value Date   TRIG 150.0 (H) 11/06/2019   TRIG 199.0 (H) 05/18/2018   TRIG 151 (H) 01/08/2008   Lab Results  Component Value Date    CHOLHDL 5 11/06/2019   CHOLHDL 4 05/18/2018   CHOLHDL 5.9 Ratio 01/08/2008   No results found for: LDLDIRECT The 10-year ASCVD risk score Mikey Bussing DC Jr., et al., 2013) is: 17.7%   Values used to calculate the score:     Age: 12 years     Sex: Female     Is Non-Hispanic African American: No     Diabetic: Yes     Tobacco smoker: No     Systolic Blood Pressure: 417 mmHg     Is BP treated: Yes     HDL Cholesterol: 46.7 mg/dL     Total Cholesterol: 256 mg/dL I have reviewed the [facility] and Soc history. Patient Active Problem List   Diagnosis Date Noted  . Controlled type 2 diabetes mellitus without complication, without long-term current use of insulin ([place]) 03/22/2019    Priority: High  . Combined hyperlipidemia associated with type 2 diabetes mellitus ([place]) 10/22/2015    Priority: High  . Essential hypertension 11/05/2008    Priority: High  . Reactive depression 11/06/2019    Priority: Medium  . Colon polyp 05/29/2019    Priority: Medium  . Patellofemoral pain syndrome of both knees 07/04/2018    Priority: Medium  . Lower extremity edema 03/03/2017    Priority: Medium  . Primary osteoarthritis of both knees 10/22/2015    Priority: Medium  . Vitamin D deficiency 11/23/2015    Priority: Low  . Statin intolerance 10/22/2015    Priority: Low  . Myalgia due to statin 02/24/2020    Failed crestor and lipitor     Social History: Patient  reports that she has quit smoking. She quit after 34.00 years of use. She has never used smokeless tobacco. She reports that she does not drink alcohol and does not use drugs.  Review of Systems: Ophthalmic: negative for eye pain, loss of vision or double vision Cardiovascular: negative for chest pain Respiratory: negative for SOB or persistent cough Gastrointestinal: negative for abdominal pain Genitourinary: negative for dysuria or gross hematuria MSK: negative for foot lesions Neurologic: negative for weakness or gait  disturbance  Objective  Vitals: BP 126/80   Pulse 83   Temp 97.7 F (36.5 C) (Temporal)   Ht 5\' 3"  (1.6 m)   Wt 168 lb 3.2 oz (76.3 kg)   SpO2 98%   BMI 29.80 kg/m  General: well appearing, no acute distress  Psych:  Alert and oriented, normal mood and affect Cardiovascular:  Nl S1 and S2, RRR without murmur, gallop or rub. no edema Respiratory:  Good breath sounds bilaterally, CTAB  with normal effort, no rales Foot exam: no erythema, pallor, or cyanosis visible nl proprioception and sensation to monofilament testing bilaterally, +2 distal pulses bilaterally    Diabetic education: ongoing education regarding chronic disease management for diabetes was given today. We continue to reinforce the ABC's of diabetic management: A1c (<7 or 8 dependent upon patient), tight blood pressure control, and cholesterol management with goal LDL < 100 minimally. We discuss diet strategies, exercise recommendations, medication options and possible side effects. At each visit, we review recommended immunizations and preventive care recommendations for diabetics and stress that good diabetic control can prevent other problems. See below for this patient's data.    Commons side effects, risks, benefits, and alternatives for medications and treatment plan prescribed today were discussed, and the patient expressed understanding of the given instructions. Patient is instructed to call or message via MyChart if he/she has any questions or concerns regarding our treatment plan. No barriers to understanding were identified. We discussed Red Flag symptoms and signs in detail. Patient expressed understanding regarding what to do in case of urgent or emergency type symptoms.   Medication list was reconciled, printed and provided to the patient in AVS. Patient instructions and summary information was reviewed with the patient as documented in the AVS. This note was prepared with assistance of Dragon voice recognition  software. Occasional wrong-word or sound-a-like substitutions may have occurred due to the inherent limitations of voice recognition software  This visit occurred during the SARS-CoV-2 public health emergency.  Safety protocols were in place, including screening questions prior to the visit, additional usage of staff PPE, and extensive cleaning of exam room while observing appropriate contact time as indicated for disinfecting solutions.

## 2020-05-17 ENCOUNTER — Emergency Department (HOSPITAL_COMMUNITY): Payer: PPO

## 2020-05-17 ENCOUNTER — Emergency Department (HOSPITAL_BASED_OUTPATIENT_CLINIC_OR_DEPARTMENT_OTHER)
Admit: 2020-05-17 | Discharge: 2020-05-17 | Disposition: A | Discharge status: Home / Self Care | Payer: PPO | Attending: Emergency Medicine | Admitting: Emergency Medicine

## 2020-05-17 ENCOUNTER — Other Ambulatory Visit: Payer: Self-pay

## 2020-05-17 ENCOUNTER — Emergency Department (HOSPITAL_COMMUNITY)
Admission: EM | Admit: 2020-05-17 | Discharge: 2020-05-17 | Disposition: A | Discharge status: Home / Self Care | Payer: PPO | Attending: Emergency Medicine | Admitting: Emergency Medicine

## 2020-05-17 ENCOUNTER — Encounter (HOSPITAL_COMMUNITY): Payer: Self-pay | Admitting: Emergency Medicine

## 2020-05-17 DIAGNOSIS — M47817 Spondylosis without myelopathy or radiculopathy, lumbosacral region: Secondary | ICD-10-CM | POA: Diagnosis not present

## 2020-05-17 DIAGNOSIS — M5432 Sciatica, left side: Secondary | ICD-10-CM | POA: Diagnosis not present

## 2020-05-17 DIAGNOSIS — E119 Type 2 diabetes mellitus without complications: Secondary | ICD-10-CM | POA: Insufficient documentation

## 2020-05-17 DIAGNOSIS — M1712 Unilateral primary osteoarthritis, left knee: Secondary | ICD-10-CM | POA: Diagnosis not present

## 2020-05-17 DIAGNOSIS — M79609 Pain in unspecified limb: Secondary | ICD-10-CM

## 2020-05-17 DIAGNOSIS — Z79899 Other long term (current) drug therapy: Secondary | ICD-10-CM | POA: Insufficient documentation

## 2020-05-17 DIAGNOSIS — M25462 Effusion, left knee: Secondary | ICD-10-CM | POA: Insufficient documentation

## 2020-05-17 DIAGNOSIS — R202 Paresthesia of skin: Secondary | ICD-10-CM | POA: Insufficient documentation

## 2020-05-17 DIAGNOSIS — Z7984 Long term (current) use of oral hypoglycemic drugs: Secondary | ICD-10-CM | POA: Insufficient documentation

## 2020-05-17 DIAGNOSIS — Z87891 Personal history of nicotine dependence: Secondary | ICD-10-CM | POA: Diagnosis not present

## 2020-05-17 DIAGNOSIS — I10 Essential (primary) hypertension: Secondary | ICD-10-CM | POA: Diagnosis not present

## 2020-05-17 DIAGNOSIS — M79605 Pain in left leg: Secondary | ICD-10-CM | POA: Diagnosis present

## 2020-05-17 DIAGNOSIS — M47816 Spondylosis without myelopathy or radiculopathy, lumbar region: Secondary | ICD-10-CM | POA: Diagnosis not present

## 2020-05-17 MED ORDER — LIDOCAINE 5 % EX PTCH: 1.0000 | MEDICATED_PATCH | CUTANEOUS | 0 refills | Status: DC

## 2020-05-17 MED ORDER — HYDROCODONE-ACETAMINOPHEN 5-325 MG PO TABS: 1.0000 | ORAL_TABLET | ORAL | 0 refills | Status: DC | PRN

## 2020-05-17 MED ORDER — METHOCARBAMOL 500 MG PO TABS
500.0000 mg | ORAL_TABLET | Freq: Once | ORAL | Status: AC
  Administered 2020-05-17: 500 mg via ORAL
  Filled 2020-05-17: qty 1

## 2020-05-17 MED ORDER — KETOROLAC TROMETHAMINE 30 MG/ML IJ SOLN
30.0000 mg | Freq: Once | INTRAMUSCULAR | Status: AC
  Administered 2020-05-17: 30 mg via INTRAMUSCULAR
  Filled 2020-05-17: qty 1

## 2020-05-17 MED ORDER — PREDNISONE 50 MG PO TABS
50.0000 mg | ORAL_TABLET | Freq: Every day | ORAL | 0 refills | Status: AC
Start: 2020-05-17 — End: 2020-05-22

## 2020-05-17 MED ORDER — METHOCARBAMOL 500 MG PO TABS
500.0000 mg | ORAL_TABLET | Freq: Two times a day (BID) | ORAL | 0 refills | Status: DC
Start: 2020-05-17 — End: 2020-11-30

## 2020-05-17 MED ORDER — HYDROCODONE-ACETAMINOPHEN 5-325 MG PO TABS
1.0000 | ORAL_TABLET | Freq: Once | ORAL | Status: AC
  Administered 2020-05-17: 1 via ORAL
  Filled 2020-05-17: qty 1

## 2020-05-17 NOTE — ED Notes (Signed)
Pt able to ambulate.  

## 2020-05-17 NOTE — ED Triage Notes (Signed)
Pt reports that earlier when she went to sit down on the toilet had pains that radiates from left buttock down left leg to toes. Reports already had pain in left knee for 3-4 days. This is new and different. Reports like an electrical shock. Reports pain is worse with movement.

## 2020-05-17 NOTE — Progress Notes (Signed)
Left lower extremity venous duplex has been completed. Preliminary results can be found in CV Proc through chart review.  Results were given to [facility] PA.  05/17/20 2:37 PM Carlos Levering RVT

## 2020-05-17 NOTE — Discharge Instructions (Signed)
Follow up with Orthopedics  Return for new or worsening symptoms

## 2020-05-17 NOTE — ED Provider Notes (Addendum)
[facility] DEPT Provider Note   CSN: [id number] Arrival date & time: 05/17/20  1241    History Chief Complaint  Patient presents with  . Leg Pain    Sarah Walsh is a 64 y.o. female with past history significant for anxiety, diabetes, hypertension who presents for evaluation of left leg pain.  Has had some aching to her popliteal fossa and medial left knee x3 or 4 days.  Has intermittently been taking ibuprofen for this.  Patient states when she lies flat she does not have pain.  Patient states she went to sit down on the toilet earlier today and felt a lightening bolt of pain from her left gluteus into her toe.  States she had tingling sensation to her posterior aspect of her left leg when this occurred.  Pain worse with movement.  No recent falls or injury.  No back pain.  Denies history of IV drug use, bowel or bladder incontinence, saddle paresthesia.  Prior history of malignancy.  Rates her pain 10/10 with movement.  If she lays still her pain is a 2/10.  Has never had pain like this previously.  Has not followed with orthopedics.  She denies any fever, chills, nausea, vomiting, chest pain, shortness of breath, abdominal pain, diarrhea, dysuria, lower extremity edema, erythema, warmth, paresthesias, weakness.  Denies additional aggravating or relieving factors. No CP, SOB, hemoptysis.  No prior history of PE or DVT.  History obtained from patient and past medical records. No interpretor was used.  HPI     Past Medical History:  Diagnosis Date  . Anxiety   . Colon polyp 05/29/2019  . Depression   . Diabetes mellitus without complication ([place])   . Hyperlipemia   . Hypertension    in past    Patient Active Problem List   Diagnosis Date Noted  . Myalgia due to statin 02/24/2020  . Reactive depression 11/06/2019  . Colon polyp 05/29/2019  . Controlled type 2 diabetes mellitus without complication, without long-term current use of insulin ([place])  03/22/2019  . Patellofemoral pain syndrome of both knees 07/04/2018  . Lower extremity edema 03/03/2017  . Vitamin D deficiency 11/23/2015  . Combined hyperlipidemia associated with type 2 diabetes mellitus ([place]) 10/22/2015  . Primary osteoarthritis of both knees 10/22/2015  . Statin intolerance 10/22/2015  . Essential hypertension 11/05/2008    Past Surgical History:  Procedure Laterality Date  . ABDOMINAL HYSTERECTOMY    . CESAREAN SECTION     4 times     OB History   No obstetric history on file.     Family History  Problem Relation Age of Onset  . Alzheimer's disease Mother   . Diabetes Daughter   . Diabetes Father   . Colon polyps Father   . Esophageal cancer Neg Hx   . Rectal cancer Neg Hx   . Stomach cancer Neg Hx     Social History   Tobacco Use  . Smoking status: Former Smoker    Years: 34.00  . Smokeless tobacco: Never Used  . Tobacco comment: stopped over 34 years ago  Vaping Use  . Vaping Use: Never assessed  Substance Use Topics  . Alcohol use: No  . Drug use: No    Home Medications Prior to Admission medications   Medication Sig Start Date End Date Taking? Authorizing Provider  Ascorbic Acid (VITAMIN C) 1000 MG tablet Take 1,000 mg by mouth daily.    [provider]  Biotin 1000 MCG tablet  Take 1,000 mcg by mouth 2 (two) times daily.     [provider]  calcium citrate (CALCITRATE - DOSED IN MG ELEMENTAL CALCIUM) 950 MG tablet Take 200 mg of elemental calcium by mouth daily.    [provider]  Cholecalciferol (D3-1000) 1000 units tablet Take 1,000 Units by mouth daily.    [provider]  diclofenac (VOLTAREN) 75 MG EC tablet Take 1 tablet (75 mg total) by mouth 2 (two) times daily. 04/06/20   Leamon Arnt, MD  ezetimibe (ZETIA) 10 MG tablet Take 1 tablet (10 mg total) by mouth daily. 04/06/20   Leamon Arnt, MD  FLUoxetine (PROZAC) 20 MG tablet Take 1 tablet (20 mg total) by mouth daily. 04/06/20   Leamon Arnt, MD  hydrochlorothiazide (MICROZIDE) 12.5 MG capsule Take 1 capsule (12.5 mg total) by mouth daily as needed. 05/29/19   Leamon Arnt, MD  HYDROcodone-acetaminophen (NORCO/VICODIN) 5-325 MG tablet Take 1 tablet by mouth every 4 (four) hours as needed. 05/17/20   Ozelle Brubacher A, PA-C  lidocaine (LIDODERM) 5 % Place 1 patch onto the skin daily. Remove & Discard patch within 12 hours or as directed by MD 05/17/20   Channin Agustin A, PA-C  magnesium oxide (MAG-OX) 400 MG tablet Take 400 mg by mouth daily.    [provider]  metFORMIN (GLUCOPHAGE) 500 MG tablet Take 1 tablet (500 mg total) by mouth 2 (two) times daily with a meal. 04/06/20   Leamon Arnt, MD  methocarbamol (ROBAXIN) 500 MG tablet Take 1 tablet (500 mg total) by mouth 2 (two) times daily. 05/17/20   Laymond Postle A, PA-C  Niacin (VITAMIN B-3 PO) Take by mouth.    [provider]  OMEGA-3 KRILL OIL PO Take by mouth.    [provider]  Potassium 99 MG TABS Take by mouth.    [provider]  predniSONE (DELTASONE) 50 MG tablet Take 1 tablet (50 mg total) by mouth daily for 5 days. 05/17/20 05/22/20  Zerah Hilyer A, PA-C  vitamin B-12 (CYANOCOBALAMIN) 500 MCG tablet Take 500 mcg by mouth daily.    [provider]    Allergies    Statins  Review of Systems   Review of Systems  Constitutional: Negative.   HENT: Negative.   Respiratory: Negative.   Cardiovascular: Negative.   Gastrointestinal: Negative.   Genitourinary: Negative.   Musculoskeletal: Positive for back pain and gait problem. Negative for joint swelling, myalgias, neck pain and neck stiffness.       Left leg pain  Skin: Negative.   Neurological: Negative for dizziness, tremors, seizures, syncope, facial asymmetry, speech difficulty, weakness, light-headedness, numbness and headaches.  All other systems reviewed and are negative.  Physical Exam Updated Vital Signs BP 122/73   Pulse 84   Temp 98.4 F  (36.9 C) (Oral)   Resp 16   SpO2 98%   Physical Exam Physical Exam  Constitutional: Pt appears well-developed and well-nourished. No distress.  HENT:  Head: Normocephalic and atraumatic.  Mouth/Throat: Oropharynx is clear and moist. No oropharyngeal exudate.  Eyes: Conjunctivae are normal.  Neck: Normal range of motion. Neck supple.  Full ROM without pain  Cardiovascular: Normal rate, regular rhythm and intact distal pulses.   Pulmonary/Chest: Effort normal and breath sounds normal. No respiratory distress. Pt has no wheezes.  Abdominal: Soft. Pt exhibits no distension. There is no tenderness, rebound or guarding. No abd bruit or pulsatile mass Musculoskeletal:  Full range of motion of  the T-spine and L-spine with flexion, hyperextension, and lateral flexion. No midline tenderness or stepoffs. No tenderness to palpation of the spinous processes of the T-spine or L-spine. Moderate tenderness to palpation of the paraspinous muscles of the L-spine and over the left piriformis and SI. Positive straight leg raise on left at 45' Diffuse tenderness to left knee, negative anterior drawer, unable to perform varus, valgus stress secondary to pain. Moderate knee effusion. Compartments soft. Homans negative. Able to plantarflex and dorsiflex without difficulty. Wiggles toes without difficulty. Lymphadenopathy:    Pt has no cervical adenopathy.  Neurological: Pt is alert. Pt has normal reflexes.  Reflex Scores:      Bicep reflexes are 2+ on the right side and 2+ on the left side.      Brachioradialis reflexes are 2+ on the right side and 2+ on the left side.      Patellar reflexes are 2+ on the right side and 2+ on the left side.      Achilles reflexes are 2+ on the right side and 2+ on the left side. Speech is clear and goal oriented, follows commands Normal 5/5 strength in upper and lower extremities bilaterally including dorsiflexion and plantar flexion, strong and equal grip strength Sensation  normal to light and sharp touch Moves extremities without ataxia, coordination intact Normal gait Normal balance No Clonus Skin: Skin is warm and dry. No rash noted or lesions noted. Pt is not diaphoretic. No erythema, ecchymosis,edema or warmth.  Psychiatric: Pt has a normal mood and affect. Behavior is normal.  Nursing note and vitals reviewed. ED Results / Procedures / Treatments   Labs (all labs ordered are listed, but only abnormal results are displayed) Labs Reviewed - No data to display  EKG None  Radiology DG Lumbar Spine Complete  Result Date: 05/17/2020 CLINICAL DATA:  64 year old female with history of left-sided leg pain. EXAM: LUMBAR SPINE - COMPLETE 4+ VIEW COMPARISON:  No priors. FINDINGS: There is no evidence of lumbar spine fracture. Mild exaggeration of normal lumbar lordosis. Mild multifocal degenerative disc disease, most severe at L5-S1. Multilevel facet arthropathy, most severe at L4-L5 and L5-S1. No defects of the pars interarticularis. IMPRESSION: 1. No acute radiographic abnormality of the lumbar spine. 2. Multilevel degenerative disc disease and lumbar spondylosis, as above. Electronically Signed   By: Vinnie Langton M.D.   On: 05/17/2020 14:41   DG Knee Complete 4 Views Left  Result Date: 05/17/2020 CLINICAL DATA:  Left buttock pain radiating into the left leg. No reported acute injury. EXAM: LEFT KNEE - COMPLETE 4+ VIEW COMPARISON:  None. FINDINGS: The mineralization and alignment are normal. There is no evidence of acute fracture or dislocation. There are advanced degenerative changes in the medial and patellofemoral compartments with joint space narrowing and osteophytes. There is a large knee joint effusion. Probable prepatellar soft tissue swelling. No definite foreign body or soft tissue emphysema. IMPRESSION: Advanced degenerative changes in the medial and patellofemoral compartments with large knee joint effusion. No acute osseous findings. Electronically  Signed   By: Richardean Sale M.D.   On: 05/17/2020 14:40   DG Femur Min 2 Views Left  Result Date: 05/17/2020 CLINICAL DATA:  Left leg pain. EXAM: LEFT FEMUR 2 VIEWS COMPARISON:  None. FINDINGS: No evidence of acute fracture or dislocation. Mild osteoarthritic change of the left knee. IMPRESSION: No acute findings. Electronically Signed   By: Marin Olp M.D.   On: 05/17/2020 15:17   VAS [place] LOWER EXTREMITY VENOUS (DVT) (ONLY [facility] &  [facility])  Result Date: 05/17/2020  Lower Venous DVTStudy Indications: Pain.  Risk Factors: None identified. Comparison Study: No prior studies. Performing Technologist: Oliver Hum RVT  Examination Guidelines: A complete evaluation includes B-mode imaging, spectral Doppler, color Doppler, and power Doppler as needed of all accessible portions of each vessel. Bilateral testing is considered an integral part of a complete examination. Limited examinations for reoccurring indications may be performed as noted. The reflux portion of the exam is performed with the patient in reverse Trendelenburg.  +-----+---------------+---------+-----------+----------+--------------+ RIGHTCompressibilityPhasicitySpontaneityPropertiesThrombus Aging +-----+---------------+---------+-----------+----------+--------------+ CFV  Full           Yes      Yes                                 +-----+---------------+---------+-----------+----------+--------------+   +---------+---------------+---------+-----------+----------+--------------+ LEFT     CompressibilityPhasicitySpontaneityPropertiesThrombus Aging +---------+---------------+---------+-----------+----------+--------------+ CFV      Full           Yes      Yes                                 +---------+---------------+---------+-----------+----------+--------------+ SFJ      Full                                                        +---------+---------------+---------+-----------+----------+--------------+ FV Prox   Full                                                        +---------+---------------+---------+-----------+----------+--------------+ FV Mid   Full                                                        +---------+---------------+---------+-----------+----------+--------------+ FV DistalFull                                                        +---------+---------------+---------+-----------+----------+--------------+ PFV      Full                                                        +---------+---------------+---------+-----------+----------+--------------+ POP      Full           Yes      Yes                                 +---------+---------------+---------+-----------+----------+--------------+ PTV      Full                                                        +---------+---------------+---------+-----------+----------+--------------+  PERO     Full                                                        +---------+---------------+---------+-----------+----------+--------------+     Summary: RIGHT: - No evidence of common femoral vein obstruction.  LEFT: - There is no evidence of deep vein thrombosis in the lower extremity.  - No cystic structure found in the popliteal fossa.  *See table(s) above for measurements and observations. Electronically signed by Monica Martinez MD on 05/17/2020 at 3:45:04 PM.    Final     Procedures .Ortho Injury Treatment  Date/Time: 05/17/2020 4:39 PM Performed by: Nettie Elm, PA-C Authorized by: Nettie Elm, PA-C   Consent:    Consent obtained:  Verbal   Consent given by:  Patient   Risks discussed:  Fracture, nerve damage, restricted joint movement, vascular damage, stiffness, recurrent dislocation and irreducible dislocation   Alternatives discussed:  No treatment, alternative treatment, immobilization, referral and delayed treatmentInjury location: knee Location details: left knee Injury type:  soft tissue Pre-procedure neurovascular assessment: neurovascularly intact Pre-procedure distal perfusion: normal Pre-procedure neurological function: normal Pre-procedure range of motion: normal  Anesthesia: Local anesthesia used: no  Patient sedated: NoImmobilization: brace Post-procedure neurovascular assessment: post-procedure neurovascularly intact Post-procedure distal perfusion: normal Post-procedure neurological function: normal Post-procedure range of motion: normal Patient tolerance: patient tolerated the procedure well with no immediate complications    (including critical care time)  Medications Ordered in ED Medications  ketorolac (TORADOL) 30 MG/ML injection 30 mg (30 mg Intramuscular Given 05/17/20 1511)  HYDROcodone-acetaminophen (NORCO/VICODIN) 5-325 MG per tablet 1 tablet (1 tablet Oral Given 05/17/20 1509)  methocarbamol (ROBAXIN) tablet 500 mg (500 mg Oral Given 05/17/20 1509)   ED Course  I have reviewed the triage vital signs and the nursing notes.  Pertinent labs & imaging results that were available during my care of the patient were reviewed by me and considered in my medical decision making (see chart for details).  64 year old presents for evaluation of left leg pain difficulty walking.  Worse with movement.  Afebrile, nonseptic, non-ill-appearing.  No recent injury or trauma.  Does have joint effusion to right knee however no overlying edema, erythema or warmth.  No bony tenderness.  Also seems to have some pain that begins at her left gluteus and extends down posterior aspect of her leg.  Seems to be somewhat radicular nature and has a positive straight leg raise.  Lungs clear.  Abdomen soft, nontender.  Able to reproduce pain.  Tenderness to left piriformis and SI.  Positive straight leg raise on left.  Plan on imaging and reassess  Imaging personally reviewed and interpreted:  Ultrasound negative for DVT Plain film back with DDD and spondylolisthesis DG  femur without acute changes Plain film left knee with moderate effusion, no fracture, dislocation  Patient reassessed.  Significant improvement after medications here in the emergency department.  She is ambulatory with out difficulty.  Patient provided with knee sleeve.  Started on steroids, she is diabetic however knows to check her blood sugar frequently and DC steroids if CBG significantly elevated.  Will also prescribe pain medication.  She will follow-up closely with orthopedics tomorrow.  Low suspicion for septic joint, hemarthrosis, acute fracture, dislocation, VTE, myositis, compartment syndrome, bacterial infectious process, acute neurosurgical emergency such as  cauda equina, discitis, osteomyelitis, transverse myelitis, psoas abscess.  The patient has been appropriately medically screened and/or stabilized in the ED. I have low suspicion for any other emergent medical condition which would require further screening, evaluation or treatment in the ED or require inpatient management.  Patient is hemodynamically stable and in no acute distress.  Patient able to ambulate in department prior to ED.  Evaluation does not show acute pathology that would require ongoing or additional emergent interventions while in the emergency department or further inpatient treatment.  I have discussed the diagnosis with the patient and answered all questions.  Pain is been managed while in the emergency department and patient has no further complaints prior to discharge.  Patient is comfortable with plan discussed in room and is stable for discharge at this time.  I have discussed strict return precautions for returning to the emergency department.  Patient was encouraged to follow-up with PCP/specialist refer to at discharge.   Patient seen and evaluated by attending, Dr. Melina Copa agrees with treatment, plan and disposition.  The patient has been appropriately medically screened and/or stabilized in the ED. I have  low suspicion for any other emergent medical condition which would require further screening, evaluation or treatment in the ED or require inpatient management.  Patient is hemodynamically stable and in no acute distress.  Patient able to ambulate in department prior to ED.  Evaluation does not show acute pathology that would require ongoing or additional emergent interventions while in the emergency department or further inpatient treatment.  I have discussed the diagnosis with the patient and answered all questions.  Pain is been managed while in the emergency department and patient has no further complaints prior to discharge.  Patient is comfortable with plan discussed in room and is stable for discharge at this time.  I have discussed strict return precautions for returning to the emergency department.  Patient was encouraged to follow-up with PCP/specialist refer to at discharge. Clinical Course as of May 17 1638  Sun May 18, 4839  [id number] 64 year old female with left leg pain causing her difficulty ambulating.  Worse with movement.  Sounds somewhat radicular but also somewhat joint related.  Afebrile.  No erythema.  Improved with some symptomatic pain relief.  Recommend close follow-up with orthopedics.   [MB]    Clinical Course User Index [MB] Hayden Rasmussen, MD   MDM Rules/Calculators/A&P                           Final Clinical Impression(s) / ED Diagnoses Final diagnoses:  Sciatica of left side  Effusion of left knee    Rx / DC Orders ED Discharge Orders         Ordered    predniSONE (DELTASONE) 50 MG tablet  Daily     Discontinue  Reprint     05/17/20 1633    HYDROcodone-acetaminophen (NORCO/VICODIN) 5-325 MG tablet  Every 4 hours PRN     Discontinue  Reprint     05/17/20 1633    methocarbamol (ROBAXIN) 500 MG tablet  2 times daily     Discontinue  Reprint     05/17/20 1633    lidocaine (LIDODERM) 5 %  Every 24 hours     Discontinue  Reprint     05/17/20 1633             Myrla Malanowski A, PA-C 05/17/20 1638    Damante Spragg A, PA-C 05/17/20 1639  Hayden Rasmussen, MD 05/18/20 [phone]

## 2020-05-29 DIAGNOSIS — M1712 Unilateral primary osteoarthritis, left knee: Secondary | ICD-10-CM | POA: Diagnosis not present

## 2020-05-31 DIAGNOSIS — Z20828 Contact with and (suspected) exposure to other viral communicable diseases: Secondary | ICD-10-CM | POA: Diagnosis not present

## 2020-06-02 ENCOUNTER — Telehealth: Payer: Self-pay | Admitting: Family Medicine

## 2020-06-02 NOTE — Progress Notes (Signed)
°  Chronic Care Management   Outreach Note  06/02/2020 Name: Sarah Walsh MRN: [id number] DOB: 09/30/1956  Referred by: Leamon Arnt, MD Reason for referral : No chief complaint on file.   An unsuccessful telephone outreach was attempted today. The patient was referred to the pharmacist for assistance with care management and care coordination.   Follow Up Plan:   Earney [place] Upstream Scheduler

## 2020-06-10 ENCOUNTER — Other Ambulatory Visit: Payer: Self-pay | Admitting: Family Medicine

## 2020-06-15 ENCOUNTER — Telehealth: Payer: Self-pay | Admitting: Family Medicine

## 2020-06-15 NOTE — Progress Notes (Signed)
  Chronic Care Management   Outreach Note  06/15/2020 Name: TAMMATHA COBB MRN: [id number] DOB: [birth year]  Referred by: Leamon Arnt, MD Reason for referral : No chief complaint on file.   An unsuccessful telephone outreach was attempted today. The patient was referred to the pharmacist for assistance with care management and care coordination.   Follow Up Plan:   Earney Hamburg Upstream Scheduler

## 2020-06-24 ENCOUNTER — Telehealth: Payer: Self-pay | Admitting: Family Medicine

## 2020-06-24 NOTE — Progress Notes (Signed)
°  Chronic Care Management   Outreach Note  06/24/2020 Name: LEEYA RUSCONI MRN: [id number] DOB: 01-Jun-1956  Referred by: Leamon Arnt, MD Reason for referral : No chief complaint on file.   An unsuccessful telephone outreach was attempted today. The patient was referred to the pharmacist for assistance with care management and care coordination.   Follow Up Plan:   Earney Hamburg Upstream Scheduler

## 2020-07-15 ENCOUNTER — Encounter: Payer: Self-pay | Admitting: Family Medicine

## 2020-07-15 ENCOUNTER — Other Ambulatory Visit: Payer: Self-pay

## 2020-07-15 ENCOUNTER — Ambulatory Visit (INDEPENDENT_AMBULATORY_CARE_PROVIDER_SITE_OTHER): Payer: PPO | Admitting: Family Medicine

## 2020-07-15 VITALS — BP 114/74 | HR 95 | Temp 98.0°F | Wt 169.4 lb

## 2020-07-15 DIAGNOSIS — M222X2 Patellofemoral disorders, left knee: Secondary | ICD-10-CM

## 2020-07-15 DIAGNOSIS — M4726 Other spondylosis with radiculopathy, lumbar region: Secondary | ICD-10-CM | POA: Diagnosis not present

## 2020-07-15 DIAGNOSIS — E119 Type 2 diabetes mellitus without complications: Secondary | ICD-10-CM

## 2020-07-15 DIAGNOSIS — I10 Essential (primary) hypertension: Secondary | ICD-10-CM

## 2020-07-15 DIAGNOSIS — M222X1 Patellofemoral disorders, right knee: Secondary | ICD-10-CM

## 2020-07-15 DIAGNOSIS — M170 Bilateral primary osteoarthritis of knee: Secondary | ICD-10-CM

## 2020-07-15 DIAGNOSIS — Z23 Encounter for immunization: Secondary | ICD-10-CM

## 2020-07-15 LAB — POCT GLYCOSYLATED HEMOGLOBIN (HGB A1C): Hemoglobin A1C: 5.3 % (ref 4.0–5.6)

## 2020-07-15 MED ORDER — GABAPENTIN 300 MG PO CAPS
300.0000 mg | ORAL_CAPSULE | Freq: Every day | ORAL | 0 refills | Status: DC
Start: 2020-07-15 — End: 2020-10-12

## 2020-07-15 NOTE — Patient Instructions (Addendum)
Please follow up as scheduled for your next visit with me: 11/06/2020   I will request records from your orthopedist and then be in touch about next steps.  I'd like you to start gabapentin at night to help with your pain.  Continue taking the diclofenac twice a day.  If you have any questions or concerns, please don't hesitate to send me a message via MyChart or call the office at [phone]. Thank you for visiting with [place] today! It's our pleasure caring for you.

## 2020-07-15 NOTE — Progress Notes (Signed)
Subjective  CC:  Chief Complaint  Patient presents with  . Leg Pain    constant bilateral pain, sees ortho - does not qualify for knee replacements at this point, requesting neurologist referral    HPI: Sarah Walsh is a 64 y.o. female who presents to the office today for follow up of diabetes and problems listed above in the chief complaint.   Diabetes follow up: Her diabetic control is reported as Unchanged. She is taking medication and eats well. Weight is stable. No symptoms of hypoglycemia. She denies exertional CP or SOB or symptomatic hypoglycemia. She denies foot sores but complains of worsening paresthesias with burning numbness tingling at night both feet.   Leg pain: Reviewed emergency room notes x-ray reports from emergency room visit in July. Patient had acute onset of worsening left greater than right leg pain with radicular symptoms. She was diagnosed with lumbar DJD, possible radiculopathy, left knee arthritis with effusion. She was treated with pain medicines, prednisone taper and muscle relaxers. Since, she is done better but has persistent symptoms. She complains of bilateral lower extremity pain worse with standing. She does well if she is sitting or lying down. This pain is severely limiting her activities. She quit her job. Cannot do things like shopping with friends or grocery shopping without significant pain. Has to sit. She has seen orthopedics but I cannot records at this time. She had a steroid injection left knee which only helped mildly. She is frustrated. She does report that she has been to a neurologist several times in the past. Once her 46s and then once more recently a few years ago. Cannot recall a clear diagnosis. In the past she has had similar sciatica-like symptoms that resolved on their own. She said no recent trauma. No bowel or bladder incontinence. She has no pain from the waist up. No history of fibromyalgia.  Hypertension has been well  controlled.  Hyperlipidemia with statin intolerance. She is on Zetia. Unclear if this is exacerbating some of her muscle aches.  Wt Readings from Last 3 Encounters:  07/15/20 169 lb 6.4 oz (76.8 kg)  04/06/20 168 lb 3.2 oz (76.3 kg)  11/06/19 168 lb 9.6 oz (76.5 kg)    BP Readings from Last 3 Encounters:  07/15/20 114/74  05/17/20 127/84  04/06/20 126/80    Assessment  1. Controlled type 2 diabetes mellitus without complication, without long-term current use of insulin ([place])   2. Essential hypertension   3. Primary osteoarthritis of both knees   4. Patellofemoral pain syndrome of both knees   5. Osteoarthritis of spine with radiculopathy, lumbar region   6. Need for immunization against influenza      Plan   Diabetes is currently very well controlled. Continue Metformin. Due eye exam. Flu shot today.  Diabetic neuropathy: Start gabapentin. Education given.  Hypertension is well controlled. No changes.  Osteoarthritis, lumbar spine and knees. This is most likely not source of her symptoms. We will request orthopedic records. She may warrant to sports medicine eval or MRI. Start gabapentin nightly. Continue diclofenac.  Consider worsening myalgias on Zetia. Will monitor.  Follow up: No follow-ups on file.. Orders Placed This Encounter  Procedures  . Flu Vaccine QUAD High Dose(Fluad)  . POCT HgB A1C   Meds ordered this encounter  Medications  . gabapentin (NEURONTIN) 300 MG capsule    Sig: Take 1 capsule (300 mg total) by mouth at bedtime.    Dispense:  90 capsule  Refill:  0      Immunization History  Administered Date(s) Administered  . Fluad Quad(high Dose 65+) 08/28/2019, 07/15/2020  . Influenza, Seasonal, Injecte, Preservative Fre 10/22/2015, 07/15/2016  . Influenza,inj,Quad PF,6+ Mos 09/29/2017, 07/04/2018  . PFIZER SARS-COV-2 Vaccination 01/09/2020, 02/03/2020  . Pneumococcal Conjugate-13 03/27/2019  . Pneumococcal Polysaccharide-23 10/22/2015,  11/06/2019  . Tdap 11/22/2010  . Zoster 11/23/2015  . Zoster Recombinat (Shingrix) 05/18/2018    Diabetes Related Lab Review: Lab Results  Component Value Date   HGBA1C 5.3 07/15/2020   HGBA1C 5.5 11/06/2019   HGBA1C 6.2 (A) 03/25/2019    Lab Results  Component Value Date   MICROALBUR 1.0 11/06/2019   Lab Results  Component Value Date   CREATININE 0.66 11/06/2019   BUN 13 11/06/2019   NA 136 11/06/2019   K 3.9 11/06/2019   CL 100 11/06/2019   CO2 30 11/06/2019   Lab Results  Component Value Date   CHOL 256 (H) 11/06/2019   CHOL 184 05/18/2018   CHOL 226 (H) 01/08/2008   Lab Results  Component Value Date   HDL 46.70 11/06/2019   HDL 46.30 05/18/2018   HDL 38 (L) 01/08/2008   Lab Results  Component Value Date   LDLCALC 179 (H) 11/06/2019   LDLCALC 98 05/18/2018   LDLCALC 158 (H) 01/08/2008   Lab Results  Component Value Date   TRIG 150.0 (H) 11/06/2019   TRIG 199.0 (H) 05/18/2018   TRIG 151 (H) 01/08/2008   Lab Results  Component Value Date   CHOLHDL 5 11/06/2019   CHOLHDL 4 05/18/2018   CHOLHDL 5.9 Ratio 01/08/2008   No results found for: LDLDIRECT The 10-year ASCVD risk score Mikey Bussing DC Jr., et al., 2013) is: 14.7%   Values used to calculate the score:     Age: 81 years     Sex: Female     Is Non-Hispanic African American: No     Diabetic: Yes     Tobacco smoker: No     Systolic Blood Pressure: 638 mmHg     Is BP treated: Yes     HDL Cholesterol: 46.7 mg/dL     Total Cholesterol: 256 mg/dL I have reviewed the [place], [facility] and Soc history. Patient Active Problem List   Diagnosis Date Noted  . Controlled type 2 diabetes mellitus without complication, without long-term current use of insulin ([place]) 03/22/2019    Priority: High  . Combined hyperlipidemia associated with type 2 diabetes mellitus ([place]) 10/22/2015    Priority: High  . Essential hypertension 11/05/2008    Priority: High  . Reactive depression 11/06/2019    Priority: Medium  . Colon  polyp 05/29/2019    Priority: Medium  . Patellofemoral pain syndrome of both knees 07/04/2018    Priority: Medium  . Lower extremity edema 03/03/2017    Priority: Medium  . Primary osteoarthritis of both knees 10/22/2015    Priority: Medium  . Vitamin D deficiency 11/23/2015    Priority: Low  . Statin intolerance 10/22/2015    Priority: Low  . Osteoarthritis of spine with radiculopathy, lumbar region 07/15/2020    xrays 04/2020 Mild multifocal degenerative disc disease, most severe at L5-S1. Multilevel facet arthropathy, most severe at L4-L5 and L5-S1. No defects of the pars interarticularis.   . Myalgia due to statin 02/24/2020    Failed crestor and lipitor     Social History: Patient  reports that she has quit smoking. She quit after 34.00 years of use. She has never used smokeless tobacco.  She reports that she does not drink alcohol and does not use drugs.  Review of Systems: Ophthalmic: negative for eye pain, loss of vision or double vision Cardiovascular: negative for chest pain Respiratory: negative for SOB or persistent cough Gastrointestinal: negative for abdominal pain Genitourinary: negative for dysuria or gross hematuria MSK: negative for foot lesions Neurologic: negative for weakness or gait disturbance  Objective  Vitals: BP 114/74   Pulse 95   Temp 98 F (36.7 C) (Temporal)   Wt 169 lb 6.4 oz (76.8 kg)   SpO2 93%   BMI 30.01 kg/m  General: well appearing, no acute distress  Psych:  Alert and oriented, normal mood and affect Skin:  Warm, no rashes Neurologic:   Mental status is normal. normal gait     Diabetic education: ongoing education regarding chronic disease management for diabetes was given today. We continue to reinforce the ABC's of diabetic management: A1c (<7 or 8 dependent upon patient), tight blood pressure control, and cholesterol management with goal LDL < 100 minimally. We discuss diet strategies, exercise recommendations, medication  options and possible side effects. At each visit, we review recommended immunizations and preventive care recommendations for diabetics and stress that good diabetic control can prevent other problems. See below for this patient's data.    Commons side effects, risks, benefits, and alternatives for medications and treatment plan prescribed today were discussed, and the patient expressed understanding of the given instructions. Patient is instructed to call or message via MyChart if he/she has any questions or concerns regarding our treatment plan. No barriers to understanding were identified. We discussed Red Flag symptoms and signs in detail. Patient expressed understanding regarding what to do in case of urgent or emergency type symptoms.   Medication list was reconciled, printed and provided to the patient in AVS. Patient instructions and summary information was reviewed with the patient as documented in the AVS. This note was prepared with assistance of Dragon voice recognition software. Occasional wrong-word or sound-a-like substitutions may have occurred due to the inherent limitations of voice recognition software  This visit occurred during the SARS-CoV-2 public health emergency.  Safety protocols were in place, including screening questions prior to the visit, additional usage of staff PPE, and extensive cleaning of exam room while observing appropriate contact time as indicated for disinfecting solutions.

## 2020-07-23 ENCOUNTER — Other Ambulatory Visit: Payer: Self-pay

## 2020-07-23 ENCOUNTER — Telehealth: Payer: Self-pay

## 2020-07-23 DIAGNOSIS — Z9289 Personal history of other medical treatment: Secondary | ICD-10-CM

## 2020-07-23 DIAGNOSIS — R937 Abnormal findings on diagnostic imaging of other parts of musculoskeletal system: Secondary | ICD-10-CM

## 2020-07-23 DIAGNOSIS — Z1231 Encounter for screening mammogram for malignant neoplasm of breast: Secondary | ICD-10-CM

## 2020-07-23 NOTE — Telephone Encounter (Signed)
Patient notified both have been ordered

## 2020-07-23 NOTE — Telephone Encounter (Signed)
Patient called in and stated she needed a order for her Mammogram and bone density to the breast center again patient stated the order was cancelled and need resent in.

## 2020-07-28 DIAGNOSIS — I83813 Varicose veins of bilateral lower extremities with pain: Secondary | ICD-10-CM | POA: Diagnosis not present

## 2020-07-28 DIAGNOSIS — I83893 Varicose veins of bilateral lower extremities with other complications: Secondary | ICD-10-CM | POA: Diagnosis not present

## 2020-08-11 ENCOUNTER — Other Ambulatory Visit: Payer: Self-pay | Admitting: Family Medicine

## 2020-08-11 ENCOUNTER — Telehealth: Payer: Self-pay

## 2020-08-11 DIAGNOSIS — E2839 Other primary ovarian failure: Secondary | ICD-10-CM

## 2020-08-11 DIAGNOSIS — R921 Mammographic calcification found on diagnostic imaging of breast: Secondary | ICD-10-CM

## 2020-08-11 NOTE — Telephone Encounter (Signed)
Patient is requesting a referral to rheumatologist Dr.Deveshawr for her leg pain and arthritis Please advise

## 2020-08-12 NOTE — Telephone Encounter (Signed)
Recommend referral to orthopedics.

## 2020-08-13 ENCOUNTER — Ambulatory Visit
Admission: RE | Admit: 2020-08-13 | Discharge: 2020-08-13 | Disposition: A | Discharge status: Home / Self Care | Payer: PPO | Source: Ambulatory Visit | Attending: Family Medicine | Admitting: Family Medicine

## 2020-08-13 ENCOUNTER — Other Ambulatory Visit: Payer: Self-pay | Admitting: Family Medicine

## 2020-08-13 ENCOUNTER — Other Ambulatory Visit: Payer: Self-pay

## 2020-08-13 DIAGNOSIS — M79604 Pain in right leg: Secondary | ICD-10-CM

## 2020-08-13 DIAGNOSIS — R921 Mammographic calcification found on diagnostic imaging of breast: Secondary | ICD-10-CM

## 2020-08-13 DIAGNOSIS — N6489 Other specified disorders of breast: Secondary | ICD-10-CM | POA: Diagnosis not present

## 2020-08-13 DIAGNOSIS — M79605 Pain in left leg: Secondary | ICD-10-CM

## 2020-08-13 DIAGNOSIS — M069 Rheumatoid arthritis, unspecified: Secondary | ICD-10-CM

## 2020-08-13 NOTE — Telephone Encounter (Signed)
Patient states that she has seen ortho in the past and it was not helpful at all. PCP gave verbal order to place referral for rheumatology.

## 2020-08-20 DIAGNOSIS — I83893 Varicose veins of bilateral lower extremities with other complications: Secondary | ICD-10-CM | POA: Diagnosis not present

## 2020-08-20 DIAGNOSIS — I83813 Varicose veins of bilateral lower extremities with pain: Secondary | ICD-10-CM | POA: Diagnosis not present

## 2020-08-24 ENCOUNTER — Telehealth: Payer: Self-pay

## 2020-08-24 DIAGNOSIS — I8312 Varicose veins of left lower extremity with inflammation: Secondary | ICD-10-CM | POA: Diagnosis not present

## 2020-08-24 DIAGNOSIS — I8311 Varicose veins of right lower extremity with inflammation: Secondary | ICD-10-CM | POA: Diagnosis not present

## 2020-08-24 DIAGNOSIS — I83813 Varicose veins of bilateral lower extremities with pain: Secondary | ICD-10-CM | POA: Diagnosis not present

## 2020-08-24 NOTE — Telephone Encounter (Signed)
Breast center recommended surgical consult.  May refer to breast surgeon; surgeon can then recommend further imaging as warranted

## 2020-08-24 NOTE — Telephone Encounter (Signed)
Please advise 

## 2020-08-24 NOTE — Telephone Encounter (Signed)
Pt called asking if Dr. Jonni Sanger would put in a referral for her to get an MRI for her breast implant. She wants to get it done at [facility]. Please advise.

## 2020-08-25 ENCOUNTER — Other Ambulatory Visit: Payer: Self-pay

## 2020-08-25 NOTE — Telephone Encounter (Signed)
Should I place this under plastics or general surgeon?

## 2020-08-27 ENCOUNTER — Other Ambulatory Visit: Payer: Self-pay

## 2020-08-27 DIAGNOSIS — T859XXA Unspecified complication of internal prosthetic device, implant and graft, initial encounter: Secondary | ICD-10-CM

## 2020-08-27 NOTE — Telephone Encounter (Signed)
Patient is calling in asking for an update and is very concerned about how long this referral is taking.

## 2020-08-27 NOTE — Telephone Encounter (Signed)
I have placed referral with plastic surgeon dx. Complication with breast implant

## 2020-08-27 NOTE — Telephone Encounter (Signed)
Please see below.

## 2020-08-27 NOTE — Telephone Encounter (Signed)
Please see below. Should this referral be placed under plastics or general surgeon?

## 2020-08-28 NOTE — Telephone Encounter (Signed)
This has been done.

## 2020-08-28 NOTE — Telephone Encounter (Signed)
Patient aware of referral

## 2020-08-31 ENCOUNTER — Telehealth: Payer: Self-pay

## 2020-08-31 MED ORDER — AMOXICILLIN 875 MG PO TABS: 875.0000 mg | ORAL_TABLET | Freq: Two times a day (BID) | ORAL | 0 refills | Status: AC

## 2020-08-31 NOTE — Telephone Encounter (Signed)
Patient aware medication sent to pharmacy...

## 2020-08-31 NOTE — Telephone Encounter (Signed)
Patient is requesting anitbiotics for a tooth ache she gets every couple years patient states dr.andy will prescribe her some every time this happens. Patient would like RX sent to [place], [place]

## 2020-08-31 NOTE — Telephone Encounter (Signed)
Please advise 

## 2020-08-31 NOTE — Telephone Encounter (Signed)
i've ordered amoxicillin but needs OV if worsening or dental appt. thanks

## 2020-09-09 ENCOUNTER — Telehealth: Payer: Self-pay

## 2020-09-09 DIAGNOSIS — I8312 Varicose veins of left lower extremity with inflammation: Secondary | ICD-10-CM | POA: Diagnosis not present

## 2020-09-09 DIAGNOSIS — I83812 Varicose veins of left lower extremities with pain: Secondary | ICD-10-CM | POA: Diagnosis not present

## 2020-09-09 NOTE — Telephone Encounter (Signed)
RN attempted to reach patient; left messages; patient failed to return call to the office concerning no show for pre visit appt today; PV and procedure appts cancelled; no show letter sent to patient;

## 2020-09-11 DIAGNOSIS — I8312 Varicose veins of left lower extremity with inflammation: Secondary | ICD-10-CM | POA: Diagnosis not present

## 2020-09-16 ENCOUNTER — Telehealth: Payer: Self-pay | Admitting: *Deleted

## 2020-09-16 NOTE — Telephone Encounter (Signed)
Attempted to reach pt to reschedule PV.  No answer and LM.  Procedure cancelled.  Letter sent to patient.

## 2020-09-16 NOTE — Telephone Encounter (Signed)
Pt was a no show for previsit.  LM on VM for pt to call back to reschedule PV for upcoming procedure.

## 2020-09-23 ENCOUNTER — Encounter: Payer: PPO | Admitting: Gastroenterology

## 2020-10-06 DIAGNOSIS — I83892 Varicose veins of left lower extremities with other complications: Secondary | ICD-10-CM | POA: Diagnosis not present

## 2020-10-06 DIAGNOSIS — I8312 Varicose veins of left lower extremity with inflammation: Secondary | ICD-10-CM | POA: Diagnosis not present

## 2020-10-06 DIAGNOSIS — I83812 Varicose veins of left lower extremities with pain: Secondary | ICD-10-CM | POA: Diagnosis not present

## 2020-10-07 ENCOUNTER — Encounter: Payer: PPO | Admitting: Gastroenterology

## 2020-10-11 ENCOUNTER — Other Ambulatory Visit: Payer: Self-pay | Admitting: Family Medicine

## 2020-10-13 ENCOUNTER — Encounter: Payer: Self-pay | Admitting: Plastic Surgery

## 2020-10-13 ENCOUNTER — Ambulatory Visit (INDEPENDENT_AMBULATORY_CARE_PROVIDER_SITE_OTHER): Payer: PPO | Admitting: Plastic Surgery

## 2020-10-13 ENCOUNTER — Other Ambulatory Visit: Payer: Self-pay

## 2020-10-13 VITALS — BP 139/86 | HR 89 | Temp 98.1°F | Ht 63.0 in | Wt 170.0 lb

## 2020-10-13 DIAGNOSIS — E119 Type 2 diabetes mellitus without complications: Secondary | ICD-10-CM | POA: Diagnosis not present

## 2020-10-13 DIAGNOSIS — T8543XA Leakage of breast prosthesis and implant, initial encounter: Secondary | ICD-10-CM | POA: Insufficient documentation

## 2020-10-13 NOTE — Progress Notes (Signed)
Patient ID: Sarah Walsh, female    DOB: 06-28-1956, 64 y.o.   MRN: [id number]   Chief Complaint  Patient presents with  . Breast Problem    The patient is a 64 year old female here for evaluation of her breasts.  She is 5 feet 3 inches tall and weighs 170 pounds.  She had silicone breast implants placed by Dr. Jerline Pain over 30 years ago.  She thinks they are under the muscle.  She does not know the size of the implants or if they are smooth or textured.  A mammogram and ultrasound has confirmed at least a left breast implant rupture from 10/21.  She does not have a history of breast cancer.  She is not smoking.  She does have diabetes.  The implants are extremely firm.  I think they are probably both ruptured.  They feel like they may be 200 cc.   Review of Systems  Constitutional: Negative.  Negative for activity change and appetite change.  HENT: Negative.   Eyes: Negative.   Respiratory: Negative.  Negative for chest tightness and shortness of breath.   Cardiovascular: Negative for leg swelling.  Gastrointestinal: Negative for abdominal distention and abdominal pain.  Endocrine: Negative.   Genitourinary: Negative.   Musculoskeletal: Negative.   Skin: Negative.   Hematological: Negative.   Psychiatric/Behavioral: Negative.     Past Medical History:  Diagnosis Date  . Anxiety   . Colon polyp 05/29/2019  . Depression   . Diabetes mellitus without complication ([place])   . Hyperlipemia   . Hypertension    in past    Past Surgical History:  Procedure Laterality Date  . ABDOMINAL HYSTERECTOMY    . AUGMENTATION MAMMAPLASTY Bilateral    36 years   . CESAREAN SECTION     4 times      Current Outpatient Medications:  .  Ascorbic Acid (VITAMIN C) 1000 MG tablet, Take 1,000 mg by mouth daily., Disp: , Rfl:  .  Biotin 1000 MCG tablet, Take 1,000 mcg by mouth 2 (two) times daily. , Disp: , Rfl:  .  calcium citrate (CALCITRATE - DOSED IN MG ELEMENTAL CALCIUM) 950 MG tablet, Take  200 mg of elemental calcium by mouth daily., Disp: , Rfl:  .  Cholecalciferol (D3-1000) 1000 units tablet, Take 1,000 Units by mouth daily., Disp: , Rfl:  .  diclofenac (VOLTAREN) 75 MG EC tablet, Take 1 tablet (75 mg total) by mouth 2 (two) times daily., Disp: 180 tablet, Rfl: 3 .  ezetimibe (ZETIA) 10 MG tablet, Take 1 tablet (10 mg total) by mouth daily., Disp: 90 tablet, Rfl: 3 .  FLUoxetine (PROZAC) 20 MG tablet, Take 1 tablet (20 mg total) by mouth daily., Disp: 90 tablet, Rfl: 3 .  gabapentin (NEURONTIN) 300 MG capsule, Take 1 capsule by mouth at bedtime, Disp: 90 capsule, Rfl: 0 .  hydrochlorothiazide (MICROZIDE) 12.5 MG capsule, TAKE 1 CAPSULE BY MOUTH ONCE DAILY AS NEEDED, Disp: 90 capsule, Rfl: 0 .  magnesium oxide (MAG-OX) 400 MG tablet, Take 400 mg by mouth daily., Disp: , Rfl:  .  metFORMIN (GLUCOPHAGE) 500 MG tablet, Take 1 tablet (500 mg total) by mouth 2 (two) times daily with a meal., Disp: 180 tablet, Rfl: 3 .  methocarbamol (ROBAXIN) 500 MG tablet, Take 1 tablet (500 mg total) by mouth 2 (two) times daily., Disp: 20 tablet, Rfl: 0 .  Niacin (VITAMIN B-3 PO), Take by mouth., Disp: , Rfl:  .  OMEGA-3 KRILL OIL PO,  Take by mouth., Disp: , Rfl:  .  Potassium 99 MG TABS, Take by mouth., Disp: , Rfl:  .  vitamin B-12 (CYANOCOBALAMIN) 500 MCG tablet, Take 500 mcg by mouth daily., Disp: , Rfl:    Objective:   Vitals:   10/13/20 1104  BP: 139/86  Pulse: 89  Temp: 98.1 F (36.7 C)  SpO2: 98%    Physical Exam Vitals and nursing note reviewed.  Constitutional:      Appearance: Normal appearance.  HENT:     Head: Normocephalic and atraumatic.  Cardiovascular:     Rate and Rhythm: Normal rate.     Pulses: Normal pulses.  Pulmonary:     Effort: Pulmonary effort is normal. No respiratory distress.  Abdominal:     General: Abdomen is flat. There is no distension.  Neurological:     General: No focal deficit present.     Mental Status: She is alert and oriented to person,  place, and time.  Psychiatric:        Mood and Affect: Mood normal.        Behavior: Behavior normal.     Assessment & Plan:  Controlled type 2 diabetes mellitus without complication, without long-term current use of insulin ([facility])  Breast implant rupture, initial encounter  Recommend removal of bilateral breast implants and complete capsulectomies.  Then I recommend a mastopexy.  The patient agrees not to replace the implants.   Pictures were obtained of the patient and placed in the chart with the patient's or guardian's permission.   [place], DO

## 2020-10-22 DIAGNOSIS — I8312 Varicose veins of left lower extremity with inflammation: Secondary | ICD-10-CM | POA: Diagnosis not present

## 2020-11-06 ENCOUNTER — Encounter: Payer: PPO | Admitting: Family Medicine

## 2020-11-22 NOTE — H&P (View-Only) (Signed)
ICD-10-CM   1. Breast implant rupture, subsequent encounter  T85.43XD       Patient ID: Sarah Walsh, female    DOB: 03-15-1956, 65 y.o.   MRN: [id number]   History of Present Illness: Sarah Walsh is a 65 y.o.  female  with a history of ruptured breast implants.  She presents for preoperative evaluation for upcoming procedure, bilateral removal of ruptured breast implants and capsulectomies, scheduled for 12/10/2020 with Dr. Marla Roe.  Summary from previous visit: Patient is 5 feet 3 inches tall and weighs 170 pounds.  She had silicone breast implants placed by Dr. Jerline Pain over 30 years ago.  She believes they are under the muscle and is unsure of the size of the implants or if they are smoother textured.  A mammogram and ultrasound in October 2021 confirmed that at least the left breast implant was ruptured.  No personal history of breast cancer.  The implants are extremely firm and appear likely that both are ruptured.  Suspect they are approximately 200 cc.  PMH Significant for: Anxiety/depression, diabetes, HLD  The patient has not had problems with anesthesia.   Past Medical History: Allergies: Allergies  Allergen Reactions  . Statins Other (See Comments)    myalgia    Current Medications:  Current Outpatient Medications:  .  Cholecalciferol 25 MCG (1000 UT) tablet, Take 1,000 Units by mouth daily., Disp: , Rfl:  .  diclofenac (VOLTAREN) 75 MG EC tablet, Take 1 tablet (75 mg total) by mouth 2 (two) times daily., Disp: 180 tablet, Rfl: 3 .  FLUoxetine (PROZAC) 20 MG tablet, Take 1 tablet (20 mg total) by mouth daily., Disp: 90 tablet, Rfl: 3 .  gabapentin (NEURONTIN) 300 MG capsule, Take 1 capsule by mouth at bedtime, Disp: 90 capsule, Rfl: 0 .  hydrochlorothiazide (MICROZIDE) 12.5 MG capsule, TAKE 1 CAPSULE BY MOUTH ONCE DAILY AS NEEDED, Disp: 90 capsule, Rfl: 0 .  magnesium oxide (MAG-OX) 400 MG tablet, Take 400 mg by mouth daily., Disp: , Rfl:  .  metFORMIN (GLUCOPHAGE)  500 MG tablet, Take 1 tablet (500 mg total) by mouth 2 (two) times daily with a meal., Disp: 180 tablet, Rfl: 3 .  methocarbamol (ROBAXIN) 500 MG tablet, Take 1 tablet (500 mg total) by mouth 2 (two) times daily., Disp: 20 tablet, Rfl: 0 .  Ascorbic Acid (VITAMIN C) 1000 MG tablet, Take 1,000 mg by mouth daily., Disp: , Rfl:  .  Biotin 1000 MCG tablet, Take 1,000 mcg by mouth 2 (two) times daily. , Disp: , Rfl:  .  calcium citrate (CALCITRATE - DOSED IN MG ELEMENTAL CALCIUM) 950 MG tablet, Take 200 mg of elemental calcium by mouth daily., Disp: , Rfl:  .  ezetimibe (ZETIA) 10 MG tablet, Take 1 tablet (10 mg total) by mouth daily., Disp: 90 tablet, Rfl: 3 .  Niacin (VITAMIN B-3 PO), Take by mouth., Disp: , Rfl:  .  OMEGA-3 KRILL OIL PO, Take by mouth., Disp: , Rfl:  .  Potassium 99 MG TABS, Take by mouth., Disp: , Rfl:  .  vitamin B-12 (CYANOCOBALAMIN) 500 MCG tablet, Take 500 mcg by mouth daily., Disp: , Rfl:   Past Medical Problems: Past Medical History:  Diagnosis Date  . Anxiety   . Colon polyp 05/29/2019  . Depression   . Diabetes mellitus without complication ([place])   . Hyperlipemia   . Hypertension    in past    Past Surgical History: Past Surgical History:  Procedure Laterality  Date  . ABDOMINAL HYSTERECTOMY    . AUGMENTATION MAMMAPLASTY Bilateral    36 years   . CESAREAN SECTION     4 times    Social History: Social History   Socioeconomic History  . Marital status: Single    Spouse name: Not on file  . Number of children: Not on file  . Years of education: Not on file  . Highest education level: Not on file  Occupational History  . Not on file  Tobacco Use  . Smoking status: Former Smoker    Years: 34.00  . Smokeless tobacco: Never Used  . Tobacco comment: stopped over 34 years ago  Vaping Use  . Vaping Use: Not on file  Substance and Sexual Activity  . Alcohol use: No  . Drug use: No  . Sexual activity: Not on file  Other Topics Concern  . Not on file   Social History Narrative  . Not on file   Social Determinants of Health   Financial Resource Strain: Not on file  Food Insecurity: Not on file  Transportation Needs: Not on file  Physical Activity: Not on file  Stress: Not on file  Social Connections: Not on file  Intimate Partner Violence: Not on file    Family History: Family History  Problem Relation Age of Onset  . Alzheimer's disease Mother   . Diabetes Daughter   . Diabetes Father   . Colon polyps Father   . Esophageal cancer Neg Hx   . Rectal cancer Neg Hx   . Stomach cancer Neg Hx     Review of Systems: Review of Systems  Constitutional: Negative for chills and fever.  HENT: Negative for congestion and sore throat.   Respiratory: Negative for cough and shortness of breath.   Cardiovascular: Negative for chest pain and palpitations.  Gastrointestinal: Negative for abdominal pain, nausea and vomiting.  Skin: Negative for itching and rash.    Physical Exam: Vital Signs BP 124/80 (BP Location: Left Arm, Patient Position: Sitting, Cuff Size: Large)   Pulse 85   Ht 5\' 3"  (1.6 m)   Wt 174 lb 6.4 oz (79.1 kg)   SpO2 97%   BMI 30.89 kg/m  Physical Exam Vitals and nursing note reviewed.  Constitutional:      General: She is not in acute distress.    Appearance: Normal appearance. She is normal weight. She is not ill-appearing.  HENT:     Head: Normocephalic and atraumatic.  Eyes:     Extraocular Movements: Extraocular movements intact.  Cardiovascular:     Rate and Rhythm: Normal rate and regular rhythm.     Pulses: Normal pulses.     Heart sounds: Normal heart sounds.  Pulmonary:     Effort: Pulmonary effort is normal.     Breath sounds: Normal breath sounds. No wheezing, rhonchi or rales.  Abdominal:     General: Bowel sounds are normal.     Palpations: Abdomen is soft.  Musculoskeletal:        General: No swelling. Normal range of motion.     Cervical back: Normal range of motion.  Skin:     General: Skin is warm and dry.     Coloration: Skin is not pale.     Findings: No erythema or rash.  Neurological:     General: No focal deficit present.     Mental Status: She is alert and oriented to person, place, and time.  Psychiatric:  Mood and Affect: Mood normal.        Behavior: Behavior normal.        Thought Content: Thought content normal.        Judgment: Judgment normal.     Assessment/Plan:  Ms. Hornsby scheduled for bilateral removal of ruptured breast implants and capsulectomies with Dr. Marla Roe.  Risks, benefits, and alternatives of procedure discussed, questions answered and consent obtained.    Smoking Status: non-smoker; Counseling Given? N/A Last Mammogram: 08/13/2020; Results: No findings suspicious for malignancy; evidence of extracapsular silicone in outer superior aspect of left breast implant  Caprini Score: 5 High; Risk Factors include: 65 year old female, BMI > 25, and length of planned surgery. Recommendation for mechanical and pharmacological prophylaxis during surgery. Encourage early ambulation.   Pictures obtained: 10/13/2020  Post-op Rx sent to pharmacy: Tramadol, Keflex, Tylenol Patient takes diclofenac BID   Patient was provided with the General Surgical Risk consent document and Pain Medication Agreement prior to their appointment.  They had adequate time to read through the risk consent documents and Pain Medication Agreement. We also discussed them in person together during this preop appointment. All of their questions were answered to their satisfaction.  Recommended calling if they have any further questions.  Risk consent form and Pain Medication Agreement to be scanned into patient's chart.  Electronically signed by: Threasa Heads, PA-C 11/24/2020 1:48 PM

## 2020-11-22 NOTE — Progress Notes (Signed)
ICD-10-CM   1. Breast implant rupture, subsequent encounter  T85.43XD       Patient ID: Sarah Walsh, female    DOB: 03-15-1956, 65 y.o.   MRN: [id number]   History of Present Illness: Sarah Walsh is a 65 y.o.  female  with a history of ruptured breast implants.  She presents for preoperative evaluation for upcoming procedure, bilateral removal of ruptured breast implants and capsulectomies, scheduled for 12/10/2020 with Dr. Marla Roe.  Summary from previous visit: Patient is 5 feet 3 inches tall and weighs 170 pounds.  She had silicone breast implants placed by Dr. Jerline Pain over 30 years ago.  She believes they are under the muscle and is unsure of the size of the implants or if they are smoother textured.  A mammogram and ultrasound in October 2021 confirmed that at least the left breast implant was ruptured.  No personal history of breast cancer.  The implants are extremely firm and appear likely that both are ruptured.  Suspect they are approximately 200 cc.  PMH Significant for: Anxiety/depression, diabetes, HLD  The patient has not had problems with anesthesia.   Past Medical History: Allergies: Allergies  Allergen Reactions  . Statins Other (See Comments)    myalgia    Current Medications:  Current Outpatient Medications:  .  Cholecalciferol 25 MCG (1000 UT) tablet, Take 1,000 Units by mouth daily., Disp: , Rfl:  .  diclofenac (VOLTAREN) 75 MG EC tablet, Take 1 tablet (75 mg total) by mouth 2 (two) times daily., Disp: 180 tablet, Rfl: 3 .  FLUoxetine (PROZAC) 20 MG tablet, Take 1 tablet (20 mg total) by mouth daily., Disp: 90 tablet, Rfl: 3 .  gabapentin (NEURONTIN) 300 MG capsule, Take 1 capsule by mouth at bedtime, Disp: 90 capsule, Rfl: 0 .  hydrochlorothiazide (MICROZIDE) 12.5 MG capsule, TAKE 1 CAPSULE BY MOUTH ONCE DAILY AS NEEDED, Disp: 90 capsule, Rfl: 0 .  magnesium oxide (MAG-OX) 400 MG tablet, Take 400 mg by mouth daily., Disp: , Rfl:  .  metFORMIN (GLUCOPHAGE)  500 MG tablet, Take 1 tablet (500 mg total) by mouth 2 (two) times daily with a meal., Disp: 180 tablet, Rfl: 3 .  methocarbamol (ROBAXIN) 500 MG tablet, Take 1 tablet (500 mg total) by mouth 2 (two) times daily., Disp: 20 tablet, Rfl: 0 .  Ascorbic Acid (VITAMIN C) 1000 MG tablet, Take 1,000 mg by mouth daily., Disp: , Rfl:  .  Biotin 1000 MCG tablet, Take 1,000 mcg by mouth 2 (two) times daily. , Disp: , Rfl:  .  calcium citrate (CALCITRATE - DOSED IN MG ELEMENTAL CALCIUM) 950 MG tablet, Take 200 mg of elemental calcium by mouth daily., Disp: , Rfl:  .  ezetimibe (ZETIA) 10 MG tablet, Take 1 tablet (10 mg total) by mouth daily., Disp: 90 tablet, Rfl: 3 .  Niacin (VITAMIN B-3 PO), Take by mouth., Disp: , Rfl:  .  OMEGA-3 KRILL OIL PO, Take by mouth., Disp: , Rfl:  .  Potassium 99 MG TABS, Take by mouth., Disp: , Rfl:  .  vitamin B-12 (CYANOCOBALAMIN) 500 MCG tablet, Take 500 mcg by mouth daily., Disp: , Rfl:   Past Medical Problems: Past Medical History:  Diagnosis Date  . Anxiety   . Colon polyp 05/29/2019  . Depression   . Diabetes mellitus without complication ([place])   . Hyperlipemia   . Hypertension    in past    Past Surgical History: Past Surgical History:  Procedure Laterality  Date  . ABDOMINAL HYSTERECTOMY    . AUGMENTATION MAMMAPLASTY Bilateral    36 years   . CESAREAN SECTION     4 times    Social History: Social History   Socioeconomic History  . Marital status: Single    Spouse name: Not on file  . Number of children: Not on file  . Years of education: Not on file  . Highest education level: Not on file  Occupational History  . Not on file  Tobacco Use  . Smoking status: Former Smoker    Years: 34.00  . Smokeless tobacco: Never Used  . Tobacco comment: stopped over 34 years ago  Vaping Use  . Vaping Use: Not on file  Substance and Sexual Activity  . Alcohol use: No  . Drug use: No  . Sexual activity: Not on file  Other Topics Concern  . Not on file   Social History Narrative  . Not on file   Social Determinants of Health   Financial Resource Strain: Not on file  Food Insecurity: Not on file  Transportation Needs: Not on file  Physical Activity: Not on file  Stress: Not on file  Social Connections: Not on file  Intimate Partner Violence: Not on file    Family History: Family History  Problem Relation Age of Onset  . Alzheimer's disease Mother   . Diabetes Daughter   . Diabetes Father   . Colon polyps Father   . Esophageal cancer Neg Hx   . Rectal cancer Neg Hx   . Stomach cancer Neg Hx     Review of Systems: Review of Systems  Constitutional: Negative for chills and fever.  HENT: Negative for congestion and sore throat.   Respiratory: Negative for cough and shortness of breath.   Cardiovascular: Negative for chest pain and palpitations.  Gastrointestinal: Negative for abdominal pain, nausea and vomiting.  Skin: Negative for itching and rash.    Physical Exam: Vital Signs BP 124/80 (BP Location: Left Arm, Patient Position: Sitting, Cuff Size: Large)   Pulse 85   Ht 5\' 3"  (1.6 m)   Wt 174 lb 6.4 oz (79.1 kg)   SpO2 97%   BMI 30.89 kg/m  Physical Exam Vitals and nursing note reviewed.  Constitutional:      General: She is not in acute distress.    Appearance: Normal appearance. She is normal weight. She is not ill-appearing.  HENT:     Head: Normocephalic and atraumatic.  Eyes:     Extraocular Movements: Extraocular movements intact.  Cardiovascular:     Rate and Rhythm: Normal rate and regular rhythm.     Pulses: Normal pulses.     Heart sounds: Normal heart sounds.  Pulmonary:     Effort: Pulmonary effort is normal.     Breath sounds: Normal breath sounds. No wheezing, rhonchi or rales.  Abdominal:     General: Bowel sounds are normal.     Palpations: Abdomen is soft.  Musculoskeletal:        General: No swelling. Normal range of motion.     Cervical back: Normal range of motion.  Skin:     General: Skin is warm and dry.     Coloration: Skin is not pale.     Findings: No erythema or rash.  Neurological:     General: No focal deficit present.     Mental Status: She is alert and oriented to person, place, and time.  Psychiatric:  Mood and Affect: Mood normal.        Behavior: Behavior normal.        Thought Content: Thought content normal.        Judgment: Judgment normal.     Assessment/Plan:  Ms. Hornsby scheduled for bilateral removal of ruptured breast implants and capsulectomies with Dr. Marla Roe.  Risks, benefits, and alternatives of procedure discussed, questions answered and consent obtained.    Smoking Status: non-smoker; Counseling Given? N/A Last Mammogram: 08/13/2020; Results: No findings suspicious for malignancy; evidence of extracapsular silicone in outer superior aspect of left breast implant  Caprini Score: 5 High; Risk Factors include: 65 year old female, BMI > 25, and length of planned surgery. Recommendation for mechanical and pharmacological prophylaxis during surgery. Encourage early ambulation.   Pictures obtained: 10/13/2020  Post-op Rx sent to pharmacy: Tramadol, Keflex, Tylenol Patient takes diclofenac BID   Patient was provided with the General Surgical Risk consent document and Pain Medication Agreement prior to their appointment.  They had adequate time to read through the risk consent documents and Pain Medication Agreement. We also discussed them in person together during this preop appointment. All of their questions were answered to their satisfaction.  Recommended calling if they have any further questions.  Risk consent form and Pain Medication Agreement to be scanned into patient's chart.  Electronically signed by: Threasa Heads, PA-C 11/24/2020 1:48 PM

## 2020-11-24 ENCOUNTER — Encounter: Payer: PPO | Admitting: Gastroenterology

## 2020-11-24 ENCOUNTER — Encounter: Payer: Self-pay | Admitting: Plastic Surgery

## 2020-11-24 ENCOUNTER — Ambulatory Visit (INDEPENDENT_AMBULATORY_CARE_PROVIDER_SITE_OTHER): Payer: Medicare HMO | Admitting: Plastic Surgery

## 2020-11-24 ENCOUNTER — Other Ambulatory Visit: Payer: Self-pay

## 2020-11-24 VITALS — BP 124/80 | HR 85 | Ht 63.0 in | Wt 174.4 lb

## 2020-11-24 DIAGNOSIS — T8543XD Leakage of breast prosthesis and implant, subsequent encounter: Secondary | ICD-10-CM

## 2020-11-24 MED ORDER — CEPHALEXIN 500 MG PO CAPS
500.0000 mg | ORAL_CAPSULE | Freq: Four times a day (QID) | ORAL | 0 refills | Status: AC
Start: 2020-11-24 — End: 2020-11-27

## 2020-11-24 MED ORDER — ACETAMINOPHEN 500 MG PO TABS
500.0000 mg | ORAL_TABLET | Freq: Four times a day (QID) | ORAL | 0 refills | Status: DC | PRN
Start: 2020-11-24 — End: 2022-06-27

## 2020-11-24 MED ORDER — TRAMADOL HCL 50 MG PO TABS: 50.0000 mg | ORAL_TABLET | Freq: Three times a day (TID) | ORAL | 0 refills | Status: DC | PRN

## 2020-11-25 ENCOUNTER — Other Ambulatory Visit: Payer: PPO

## 2020-11-30 ENCOUNTER — Ambulatory Visit (AMBULATORY_SURGERY_CENTER): Payer: Self-pay

## 2020-11-30 ENCOUNTER — Other Ambulatory Visit: Payer: Self-pay

## 2020-11-30 VITALS — Ht 63.0 in | Wt 178.0 lb

## 2020-11-30 DIAGNOSIS — Z86010 Personal history of colonic polyps: Secondary | ICD-10-CM

## 2020-11-30 NOTE — Progress Notes (Addendum)
Office Visit Note  Patient: Sarah Walsh             Date of Birth: Oct 25, 1955           MRN: [id number]             PCP: Patient, No Pcp Per Referring: Leamon Arnt, MD Visit Date: 12/14/2020 Occupation: _0 @  Subjective:  Pain in both knees.   History of Present Illness: Sarah Walsh is a 65 y.o. female seen in consultation per request of her PCP.  According the patient for the last 10 to 15 years she has had discomfort in her knee joints.  She had been seen by an orthopedic surgeon in the past who gave her shots in her knees which helped her only for some time.  She states in July 2021 she was having lower back pain and difficulty walking at the time she was seen by her PCP and had x-rays.  Which showed some degenerative changes and osteoarthritis.  She states the back pain has resolved but she continues to have discomfort in her knee joints and also in her leg muscles.  She takes magnesium for muscle cramps.  She has taken statins in the past and could not tolerate due to increased muscle cramps.  She tried to statins last time in the fall 2021 and discontinued.  There is no family history of autoimmune disease.  She denies pain in any other joints.  Activities of Daily Living:  Patient reports morning stiffness for 5 minutes.   Patient Reports nocturnal pain.  Difficulty dressing/grooming: Denies Difficulty climbing stairs: Reports Difficulty getting out of chair: Reports Difficulty using hands for taps, buttons, cutlery, and/or writing: Denies  Review of Systems  Constitutional: Negative for fatigue, night sweats, weight gain and weight loss.  HENT: Negative for mouth sores, trouble swallowing, trouble swallowing, mouth dryness and nose dryness.   Eyes: Negative for pain, redness, itching, visual disturbance and dryness.  Respiratory: Negative for cough, shortness of breath and difficulty breathing.   Cardiovascular: Negative for chest pain, palpitations, hypertension,  irregular heartbeat and swelling in legs/feet.  Gastrointestinal: Negative for blood in stool, constipation and diarrhea.  Endocrine: Negative for increased urination.  Genitourinary: Negative for difficulty urinating and vaginal dryness.  Musculoskeletal: Positive for arthralgias, joint pain, myalgias, muscle weakness, morning stiffness and myalgias. Negative for joint swelling and muscle tenderness.  Skin: Negative for color change, rash, hair loss, redness, skin tightness, ulcers and sensitivity to sunlight.  Allergic/Immunologic: Negative for susceptible to infections.  Neurological: Negative for dizziness, numbness, headaches, memory loss, night sweats and weakness.  Hematological: Positive for bruising/bleeding tendency. Negative for swollen glands.  Psychiatric/Behavioral: Positive for depressed mood. Negative for confusion and sleep disturbance. The patient is not nervous/anxious.     PMFS History:  Patient Active Problem List   Diagnosis Date Noted  . Breast implant rupture 10/13/2020  . Osteoarthritis of spine with radiculopathy, lumbar region 07/15/2020  . Myalgia due to statin 02/24/2020  . Reactive depression 11/06/2019  . Colon polyp 05/29/2019  . Controlled type 2 diabetes mellitus without complication, without long-term current use of insulin ([place]) 03/22/2019  . Patellofemoral pain syndrome of both knees 07/04/2018  . Lower extremity edema 03/03/2017  . Vitamin D deficiency 11/23/2015  . Combined hyperlipidemia associated with type 2 diabetes mellitus ([place]) 10/22/2015  . Primary osteoarthritis of both knees 10/22/2015  . Statin intolerance 10/22/2015  . Essential hypertension 11/05/2008    Past Medical History:  Diagnosis Date  .  Arthritis    bilateral knees  . Colon polyp 05/29/2019  . Depression    on meds-situational anxiety  . Diabetes mellitus without complication ([place])    on meds  . Hyperlipemia    on meds  . Hypertension    hx of     Family History   Problem Relation Age of Onset  . Alzheimer's disease Mother   . Diabetes Daughter   . Healthy Daughter   . Diabetes Father   . Colon polyps Father   . Healthy Daughter   . Healthy Daughter   . Healthy Son   . Esophageal cancer Neg Hx   . Rectal cancer Neg Hx   . Stomach cancer Neg Hx   . Colon cancer Neg Hx    Past Surgical History:  Procedure Laterality Date  . ABDOMINAL HYSTERECTOMY    . AUGMENTATION MAMMAPLASTY Bilateral    36 years   . BREAST IMPLANT REMOVAL Bilateral 12/10/2020   Procedure: REMOVAL BREAST IMPLANTS;  Surgeon: Wallace Going, DO;  Location: [place];  Service: Plastics;  Laterality: Bilateral;  2 hours please  . CAPSULECTOMY Bilateral 12/10/2020   Procedure: CAPSULECTOMY;  Surgeon: Wallace Going, DO;  Location: [place];  Service: Plastics;  Laterality: Bilateral;  . CESAREAN SECTION     4 times  . COLONOSCOPY  2020   Gupta-MAC-tics/hems/multiple polyps  . POLYPECTOMY  2020   multiple polyps  . WISDOM TOOTH EXTRACTION     Social History   Social History Narrative  . Not on file   Immunization History  Administered Date(s) Administered  . Fluad Quad(high Dose 65+) 08/28/2019, 07/15/2020  . Influenza, Seasonal, Injecte, Preservative Fre 10/22/2015, 07/15/2016  . Influenza,inj,Quad PF,6+ Mos 09/29/2017, 07/04/2018  . PFIZER(Purple Top)SARS-COV-2 Vaccination 01/09/2020, 02/03/2020, 11/18/2020  . Pneumococcal Conjugate-13 03/27/2019  . Pneumococcal Polysaccharide-23 10/22/2015, 11/06/2019  . Tdap 11/22/2010  . Zoster 11/23/2015  . Zoster Recombinat (Shingrix) 05/18/2018     Objective: Vital Signs: BP 128/86 (BP Location: Right Arm, Patient Position: Sitting, Cuff Size: Normal)   Pulse 82   Resp 15   Ht 5' 1.5" (1.562 m)   Wt 176 lb 12.8 oz (80.2 kg)   BMI 32.87 kg/m    Physical Exam Vitals and nursing note reviewed.  Constitutional:      Appearance: She is well-developed and well-nourished.   HENT:     Head: Normocephalic and atraumatic.  Eyes:     Extraocular Movements: EOM normal.     Conjunctiva/sclera: Conjunctivae normal.  Cardiovascular:     Rate and Rhythm: Normal rate and regular rhythm.     Pulses: Intact distal pulses.     Heart sounds: Normal heart sounds.  Pulmonary:     Effort: Pulmonary effort is normal.     Breath sounds: Normal breath sounds.  Abdominal:     General: Bowel sounds are normal.     Palpations: Abdomen is soft.  Musculoskeletal:     Cervical back: Normal range of motion.  Lymphadenopathy:     Cervical: No cervical adenopathy.  Skin:    General: Skin is warm and dry.     Capillary Refill: Capillary refill takes less than 2 seconds.  Neurological:     Mental Status: She is alert and oriented to person, place, and time.  Psychiatric:        Mood and Affect: Mood and affect normal.        Behavior: Behavior normal.      Musculoskeletal Exam: C-spine  was in good range of motion.  Shoulder joints, elbow joints, wrist joints, MCPs PIPs and DIPs with good range of motion with no synovitis.  Hip joints with good range of motion.  She had good range of motion of bilateral knee joints with some effusion in her left knee joint.  There was no tenderness over ankles, MTPs or PIPs.  CDAI Exam: CDAI Score: - Patient Global: -; Provider Global: - Swollen: -; Tender: - Joint Exam 12/14/2020   No joint exam has been documented for this visit   There is currently no information documented on the homunculus. Go to the Rheumatology activity and complete the homunculus joint exam.  Investigation: No additional findings.  Imaging: XR KNEE 3 VIEW LEFT  Result Date: 12/14/2020 Severe medial compartment narrowing was noted.  Severe patellofemoral narrowing was noted.  No chondrocalcinosis was noted.  Effusion noted in the left knee. Impression: These findings are consistent with severe osteoarthritis and severe chondromalacia patella.  XR KNEE 3 VIEW  RIGHT  Result Date: 12/14/2020 Severe medial compartment narrowing was noted.  Severe patellofemoral narrowing was noted.  No chondrocalcinosis was noted. Impression: These findings are consistent with severe osteoarthritis and severe chondromalacia patella.   Recent Labs: Lab Results  Component Value Date   WBC 7.7 11/06/2019   HGB 13.8 11/06/2019   PLT 298.0 11/06/2019   NA 135 12/04/2020   K 4.6 12/04/2020   CL 100 12/04/2020   CO2 24 12/04/2020   GLUCOSE 94 12/04/2020   BUN 15 12/04/2020   CREATININE 0.83 12/04/2020   BILITOT 0.6 11/06/2019   ALKPHOS 46 11/06/2019   AST 11 11/06/2019   ALT 11 11/06/2019   PROT 7.6 11/06/2019   ALBUMIN 4.3 11/06/2019   CALCIUM 9.1 12/04/2020   GFRAA >90 01/12/2012    Speciality Comments: No specialty comments available.  Procedures:  Large Joint Inj: L knee on 12/14/2020 8:47 AM Indications: pain Details: 27 G 1.5 in needle, medial approach  Arthrogram: No  Medications: 1.5 mL lidocaine 1 % Aspirate: 13 mL clear; sent for lab analysis Outcome: tolerated well, no immediate complications Procedure, treatment alternatives, risks and benefits explained, specific risks discussed. Consent was given by the patient. Immediately prior to procedure a time out was called to verify the correct patient, procedure, equipment, support staff and site/side marked as required. Patient was prepped and draped in the usual sterile fashion.     Allergies: Statins   Assessment / Plan:     Visit Diagnoses: Chronic pain of both knees -she complains of pain and discomfort in her bilateral knee joints.  She had effusion in her left knee joint.  She complains of severe pain and discomfort in her bilateral knee joints.  Especially the left knee joint has been painful.  She had bilateral varus deformity.  To evaluate the extent of osteoarthritis x-rays were obtained today after patient's consent.  Plan: XR KNEE 3 VIEW RIGHT, XR KNEE 3 VIEW LEFT.  Bilateral knee  joint showed severe medial compartment narrowing and severe patellofemoral narrowing.  X-ray findings of severe end-stage osteoarthritis were discussed with the patient.  She had bilateral genu varum deformity.  She has end-stage osteoarthritis and she will benefit from total knee replacement.  Knee joint exercises were placed in the AVS.  It would be good for her to strengthen her lower extremity muscles prior to total knee replacement.  Patient states she has seen an orthopedic surgeon in the past who did not advise surgery.  Effusion, left knee -  she had effusion in her left knee joint which I believe is also secondary to underlying osteoarthritis.  She has been having pain and discomfort especially in her left knee joint.  I do not see any features of rheumatoid arthritis in any of her joints.  She was referred to me for evaluation of rheumatoid arthritis.  I will obtain following labs.  To evaluate for the cause of effusion and therapeutic benefit left knee joint was also aspirated as described above.  She tolerated the procedure well.  The synovial fluid was sent for cell count, crystals and culture.  Plan: Rheumatoid factor, Cyclic citrul peptide antibody, IgG, Uric acid, Synovial Fluid Analysis, Complete, Anaerobic and Aerobic Culture.  Once the synovial fluid results are available we will be able to determine the cause of inflammation in the left knee joint.  Primary osteoarthritis of both knees-radiographic findings and clinical findings are consistent with osteoarthritis.  Pain in both lower extremities-she complains of pain in her bilateral lower extremities over her shin area which I believe is related to the severe osteoarthritis.  There was no tenderness over the tibia bilaterally.  Patellofemoral pain syndrome of both knees-she has chondromalacia patella which causes discomfort with mobility and difficulty climbing stairs.  She will benefit from lower extremity muscle strengthening exercises.   A handout on exercises was given.  DDD (degenerative disc disease), lumbar-she had an episode of lower back pain a year ago which resolved after some time.  I reviewed her x-rays of her lumbar spine from May 17, 2020 which showed some degenerative changes.  She is currently asymptomatic.  Core strengthening exercises were discussed to prevent recurrence of the pain.  Essential hypertension-her blood pressure is normal.  She is on hydrochlorothiazide.  Controlled type 2 diabetes mellitus without complication, without long-term current use of insulin ([facility])-treated with Metformin.  Combined hyperlipidemia associated with type 2 diabetes mellitus ([facility])-patient states she has tried a statin several times in the past but could not tolerate.  She tried to statins last year.  Statin intolerance  Myalgia due to statin-she had myalgias from statins.  The myalgias resolved after stopping statins.  She uses magnesium oxide for muscle cramps at night.  There was no muscular weakness or tenderness on examination today.  Vitamin D deficiency-she takes vitamin D 1000 units daily.  Reactive depression  History of bilateral breast implants previously removed - December 10, 2020  due to rupture.  She is recovering from the surgery.  Hx of colonic polyps  She is fully vaccinated against COVID-19.  She also received a booster in January.  Orders: Orders Placed This Encounter  Procedures  . Large Joint Inj  . Anaerobic and Aerobic Culture  . XR KNEE 3 VIEW RIGHT  . XR KNEE 3 VIEW LEFT  . Rheumatoid factor  . Cyclic citrul peptide antibody, IgG  . Uric acid  . Synovial Fluid Analysis, Complete   No orders of the defined types were placed in this encounter.   Face-to-face time spent with patient was 45 minutes. Greater than 50% of time was spent in counseling and coordination of care.  Follow-Up Instructions: Return for Osteoarthritis.   Bo Merino, MD  Note - This record has been  created using Editor, commissioning.  Chart creation errors have been sought, but may not always  have been located. Such creation errors do not reflect on  the standard of medical care.

## 2020-11-30 NOTE — Progress Notes (Signed)
No egg or soy allergy known to patient  No issues with past sedation with any surgeries or procedures No intubation problems in the past  No FH of Malignant Hyperthermia No diet pills per patient No home 02 use per patient  No blood thinners per patient  Pt reports issues with constipation ---2 day prep ordered for patient No A fib or A flutter  EMMI video via [place] 19 guidelines implemented in PV today with Pt and RN  Pt is fully vaccinated  for Covid x 2= Coupon given to pt in PV today , Code to Pharmacy and  NO PA's for preps discussed with pt in PV today  Due to the COVID-19 pandemic we are asking patients to follow certain guidelines.  Pt aware of COVID protocols and [facility] guidelines   During PV appt, patient verbalized that she is scheduled to have bilateral breast reduction (implant removal) on 12/10/2020; patient advised to reschedule colonoscopy 6-8 weeks post op breast sx- patient verbalized understanding of information and reports she will call back to the office once cleared by surgeon in order to have colonoscopy

## 2020-12-03 ENCOUNTER — Other Ambulatory Visit: Payer: Self-pay

## 2020-12-03 ENCOUNTER — Encounter (HOSPITAL_BASED_OUTPATIENT_CLINIC_OR_DEPARTMENT_OTHER): Payer: Self-pay | Admitting: Plastic Surgery

## 2020-12-04 ENCOUNTER — Encounter (HOSPITAL_BASED_OUTPATIENT_CLINIC_OR_DEPARTMENT_OTHER)
Admission: RE | Admit: 2020-12-04 | Discharge: 2020-12-04 | Disposition: A | Discharge status: Home / Self Care | Payer: Medicare HMO | Source: Ambulatory Visit | Attending: Plastic Surgery | Admitting: Plastic Surgery

## 2020-12-04 DIAGNOSIS — Z01818 Encounter for other preprocedural examination: Secondary | ICD-10-CM | POA: Diagnosis not present

## 2020-12-04 LAB — BASIC METABOLIC PANEL
Anion gap: 11 (ref 5–15)
BUN: 15 mg/dL (ref 8–23)
CO2: 24 mmol/L (ref 22–32)
Calcium: 9.1 mg/dL (ref 8.9–10.3)
Chloride: 100 mmol/L (ref 98–111)
Creatinine, Ser: 0.83 mg/dL (ref 0.44–1.00)
GFR, Estimated: >60 mL/min (ref >60)
Glucose, Bld: 94 mg/dL (ref 70–99)
Potassium: 4.6 mmol/L (ref 3.5–5.1)
Sodium: 135 mmol/L (ref 135–145)

## 2020-12-08 ENCOUNTER — Other Ambulatory Visit (HOSPITAL_COMMUNITY)
Admission: RE | Admit: 2020-12-08 | Discharge: 2020-12-08 | Disposition: A | Discharge status: Home / Self Care | Payer: Medicare HMO | Source: Ambulatory Visit | Attending: Plastic Surgery | Admitting: Plastic Surgery

## 2020-12-08 DIAGNOSIS — Z20822 Contact with and (suspected) exposure to covid-19: Secondary | ICD-10-CM | POA: Insufficient documentation

## 2020-12-08 DIAGNOSIS — Z01812 Encounter for preprocedural laboratory examination: Secondary | ICD-10-CM | POA: Insufficient documentation

## 2020-12-08 LAB — SARS CORONAVIRUS 2 (TAT 6-24 HRS): SARS Coronavirus 2: NEGATIVE

## 2020-12-10 ENCOUNTER — Ambulatory Visit (HOSPITAL_BASED_OUTPATIENT_CLINIC_OR_DEPARTMENT_OTHER)
Admission: RE | Admit: 2020-12-10 | Discharge: 2020-12-10 | Disposition: A | Discharge status: Home / Self Care | Payer: Medicare HMO | Attending: Plastic Surgery | Admitting: Plastic Surgery

## 2020-12-10 ENCOUNTER — Ambulatory Visit (HOSPITAL_BASED_OUTPATIENT_CLINIC_OR_DEPARTMENT_OTHER): Payer: Medicare HMO | Admitting: Anesthesiology

## 2020-12-10 ENCOUNTER — Other Ambulatory Visit: Payer: Self-pay

## 2020-12-10 ENCOUNTER — Encounter (HOSPITAL_BASED_OUTPATIENT_CLINIC_OR_DEPARTMENT_OTHER)
Admission: RE | Disposition: A | Discharge status: Home / Self Care | Payer: Self-pay | Source: Home / Self Care | Attending: Plastic Surgery

## 2020-12-10 ENCOUNTER — Encounter (HOSPITAL_BASED_OUTPATIENT_CLINIC_OR_DEPARTMENT_OTHER): Payer: Self-pay | Admitting: Plastic Surgery

## 2020-12-10 DIAGNOSIS — Z79899 Other long term (current) drug therapy: Secondary | ICD-10-CM | POA: Insufficient documentation

## 2020-12-10 DIAGNOSIS — Z87891 Personal history of nicotine dependence: Secondary | ICD-10-CM | POA: Insufficient documentation

## 2020-12-10 DIAGNOSIS — T8549XA Other mechanical complication of breast prosthesis and implant, initial encounter: Secondary | ICD-10-CM | POA: Diagnosis not present

## 2020-12-10 DIAGNOSIS — Z888 Allergy status to other drugs, medicaments and biological substances status: Secondary | ICD-10-CM | POA: Insufficient documentation

## 2020-12-10 DIAGNOSIS — T8543XA Leakage of breast prosthesis and implant, initial encounter: Secondary | ICD-10-CM | POA: Diagnosis not present

## 2020-12-10 DIAGNOSIS — T8543XD Leakage of breast prosthesis and implant, subsequent encounter: Secondary | ICD-10-CM | POA: Diagnosis not present

## 2020-12-10 DIAGNOSIS — Y812 Prosthetic and other implants, materials and accessory general- and plastic-surgery devices associated with adverse incidents: Secondary | ICD-10-CM | POA: Diagnosis not present

## 2020-12-10 DIAGNOSIS — Z791 Long term (current) use of non-steroidal anti-inflammatories (NSAID): Secondary | ICD-10-CM | POA: Insufficient documentation

## 2020-12-10 DIAGNOSIS — Z7984 Long term (current) use of oral hypoglycemic drugs: Secondary | ICD-10-CM | POA: Diagnosis not present

## 2020-12-10 DIAGNOSIS — Z45819 Encounter for adjustment or removal of unspecified breast implant: Secondary | ICD-10-CM | POA: Diagnosis not present

## 2020-12-10 HISTORY — PX: CAPSULECTOMY: SHX5381

## 2020-12-10 HISTORY — PX: BREAST IMPLANT REMOVAL: SHX5361

## 2020-12-10 LAB — GLUCOSE, CAPILLARY
Glucose-Capillary: 119 mg/dL — ABNORMAL HIGH (ref 70–99)
Glucose-Capillary: 152 mg/dL — ABNORMAL HIGH (ref 70–99)

## 2020-12-10 SURGERY — REMOVAL, IMPLANT, BREAST
Anesthesia: General | Site: Breast | Laterality: Bilateral

## 2020-12-10 MED ORDER — LIDOCAINE-EPINEPHRINE 1 %-1:100000 IJ SOLN
INTRAMUSCULAR | Status: AC
  Filled 2020-12-10: qty 1

## 2020-12-10 MED ORDER — CEFAZOLIN SODIUM 1 G IJ SOLR
INTRAMUSCULAR | Status: AC
  Filled 2020-12-10: qty 10

## 2020-12-10 MED ORDER — FENTANYL CITRATE (PF) 100 MCG/2ML IJ SOLN: 25.0000 ug | INTRAMUSCULAR | Status: DC | PRN

## 2020-12-10 MED ORDER — MIDAZOLAM HCL 5 MG/5ML IJ SOLN
INTRAMUSCULAR | Status: DC | PRN
  Administered 2020-12-10: 1 mg via INTRAVENOUS

## 2020-12-10 MED ORDER — OXYCODONE HCL 5 MG PO TABS
ORAL_TABLET | ORAL | Status: AC
  Filled 2020-12-10: qty 1

## 2020-12-10 MED ORDER — SUCCINYLCHOLINE CHLORIDE 200 MG/10ML IV SOSY
PREFILLED_SYRINGE | INTRAVENOUS | Status: AC
  Filled 2020-12-10: qty 10

## 2020-12-10 MED ORDER — FENTANYL CITRATE (PF) 100 MCG/2ML IJ SOLN
INTRAMUSCULAR | Status: AC
  Filled 2020-12-10: qty 2

## 2020-12-10 MED ORDER — LIDOCAINE-EPINEPHRINE 1 %-1:100000 IJ SOLN
INTRAMUSCULAR | Status: DC | PRN
  Administered 2020-12-10: 10 mL

## 2020-12-10 MED ORDER — DIPHENHYDRAMINE HCL 50 MG/ML IJ SOLN
INTRAMUSCULAR | Status: AC
  Filled 2020-12-10: qty 1

## 2020-12-10 MED ORDER — ONDANSETRON HCL 4 MG/2ML IJ SOLN
INTRAMUSCULAR | Status: DC | PRN
  Administered 2020-12-10: 4 mg via INTRAVENOUS

## 2020-12-10 MED ORDER — ACETAMINOPHEN 325 MG RE SUPP: 650.0000 mg | RECTAL | Status: DC | PRN

## 2020-12-10 MED ORDER — LIDOCAINE 2% (20 MG/ML) 5 ML SYRINGE
INTRAMUSCULAR | Status: AC
  Filled 2020-12-10: qty 5

## 2020-12-10 MED ORDER — DEXAMETHASONE SODIUM PHOSPHATE 4 MG/ML IJ SOLN
INTRAMUSCULAR | Status: DC | PRN
  Administered 2020-12-10: 10 mg via INTRAVENOUS

## 2020-12-10 MED ORDER — PROMETHAZINE HCL 25 MG/ML IJ SOLN: 6.2500 mg | INTRAMUSCULAR | Status: DC | PRN

## 2020-12-10 MED ORDER — DIPHENHYDRAMINE HCL 50 MG/ML IJ SOLN
INTRAMUSCULAR | Status: DC | PRN
  Administered 2020-12-10: 12.5 mg via INTRAVENOUS

## 2020-12-10 MED ORDER — OXYCODONE HCL 5 MG PO TABS: 5.0000 mg | ORAL_TABLET | ORAL | Status: DC | PRN

## 2020-12-10 MED ORDER — FENTANYL CITRATE (PF) 100 MCG/2ML IJ SOLN
INTRAMUSCULAR | Status: DC | PRN
  Administered 2020-12-10 (×2): 50 ug via INTRAVENOUS

## 2020-12-10 MED ORDER — PHENYLEPHRINE 40 MCG/ML (10ML) SYRINGE FOR IV PUSH (FOR BLOOD PRESSURE SUPPORT)
PREFILLED_SYRINGE | INTRAVENOUS | Status: AC
  Filled 2020-12-10: qty 10

## 2020-12-10 MED ORDER — EPHEDRINE 5 MG/ML INJ
INTRAVENOUS | Status: AC
  Filled 2020-12-10: qty 10

## 2020-12-10 MED ORDER — SODIUM CHLORIDE 0.9% FLUSH: 3.0000 mL | Freq: Two times a day (BID) | INTRAVENOUS | Status: DC

## 2020-12-10 MED ORDER — CEFAZOLIN SODIUM-DEXTROSE 2-4 GM/100ML-% IV SOLN
INTRAVENOUS | Status: AC
  Filled 2020-12-10: qty 100

## 2020-12-10 MED ORDER — CHLORHEXIDINE GLUCONATE CLOTH 2 % EX PADS: 6.0000 | MEDICATED_PAD | Freq: Once | CUTANEOUS | Status: DC

## 2020-12-10 MED ORDER — OXYCODONE HCL 5 MG/5ML PO SOLN: 5.0000 mg | Freq: Once | ORAL | Status: AC | PRN

## 2020-12-10 MED ORDER — SODIUM CHLORIDE 0.9 % IV SOLN: 250.0000 mL | INTRAVENOUS | Status: DC | PRN

## 2020-12-10 MED ORDER — LIDOCAINE-EPINEPHRINE (PF) 1 %-1:200000 IJ SOLN
INTRAMUSCULAR | Status: AC
  Filled 2020-12-10: qty 30

## 2020-12-10 MED ORDER — CEFAZOLIN SODIUM-DEXTROSE 2-4 GM/100ML-% IV SOLN
2.0000 g | INTRAVENOUS | Status: AC
  Administered 2020-12-10: 2 g via INTRAVENOUS

## 2020-12-10 MED ORDER — ONDANSETRON HCL 4 MG/2ML IJ SOLN
INTRAMUSCULAR | Status: AC
  Filled 2020-12-10: qty 2

## 2020-12-10 MED ORDER — MIDAZOLAM HCL 2 MG/2ML IJ SOLN
INTRAMUSCULAR | Status: AC
  Filled 2020-12-10: qty 2

## 2020-12-10 MED ORDER — OXYCODONE HCL 5 MG PO TABS
5.0000 mg | ORAL_TABLET | Freq: Once | ORAL | Status: AC | PRN
  Administered 2020-12-10: 5 mg via ORAL

## 2020-12-10 MED ORDER — LIDOCAINE HCL (CARDIAC) PF 100 MG/5ML IV SOSY
PREFILLED_SYRINGE | INTRAVENOUS | Status: DC | PRN
  Administered 2020-12-10: 50 mg via INTRAVENOUS

## 2020-12-10 MED ORDER — PROPOFOL 10 MG/ML IV BOLUS
INTRAVENOUS | Status: DC | PRN
  Administered 2020-12-10: 200 mg via INTRAVENOUS

## 2020-12-10 MED ORDER — ACETAMINOPHEN 325 MG PO TABS: 650.0000 mg | ORAL_TABLET | ORAL | Status: DC | PRN

## 2020-12-10 MED ORDER — LACTATED RINGERS IV SOLN: INTRAVENOUS | Status: DC

## 2020-12-10 MED ORDER — BUPIVACAINE HCL 0.25 % IJ SOLN
INTRAMUSCULAR | Status: DC | PRN
  Administered 2020-12-10: 10 mL

## 2020-12-10 MED ORDER — ACETAMINOPHEN 500 MG PO TABS
1000.0000 mg | ORAL_TABLET | Freq: Once | ORAL | Status: AC
  Administered 2020-12-10: 1000 mg via ORAL

## 2020-12-10 MED ORDER — SODIUM CHLORIDE 0.9% FLUSH: 3.0000 mL | INTRAVENOUS | Status: DC | PRN

## 2020-12-10 MED ORDER — AMISULPRIDE (ANTIEMETIC) 5 MG/2ML IV SOLN: 10.0000 mg | Freq: Once | INTRAVENOUS | Status: DC | PRN

## 2020-12-10 MED ORDER — DEXAMETHASONE SODIUM PHOSPHATE 10 MG/ML IJ SOLN
INTRAMUSCULAR | Status: AC
  Filled 2020-12-10: qty 1

## 2020-12-10 MED ORDER — BUPIVACAINE HCL (PF) 0.25 % IJ SOLN
INTRAMUSCULAR | Status: AC
  Filled 2020-12-10: qty 30

## 2020-12-10 MED ORDER — ACETAMINOPHEN 500 MG PO TABS
ORAL_TABLET | ORAL | Status: AC
  Filled 2020-12-10: qty 2

## 2020-12-10 SURGICAL SUPPLY — 70 items
BAG DECANTER FOR FLEXI CONT (MISCELLANEOUS) IMPLANT
BINDER BREAST LRG (GAUZE/BANDAGES/DRESSINGS) ×2 IMPLANT
BINDER BREAST MEDIUM (GAUZE/BANDAGES/DRESSINGS) IMPLANT
BINDER BREAST XLRG (GAUZE/BANDAGES/DRESSINGS) IMPLANT
BINDER BREAST XXLRG (GAUZE/BANDAGES/DRESSINGS) IMPLANT
BIOPATCH RED 1 DISK 7.0 (GAUZE/BANDAGES/DRESSINGS) IMPLANT
BLADE HEX COATED 2.75 (ELECTRODE) IMPLANT
BLADE SURG 15 STRL LF DISP TIS (BLADE) ×2 IMPLANT
BLADE SURG 15 STRL SS (BLADE) ×4
BNDG GAUZE ELAST 4 BULKY (GAUZE/BANDAGES/DRESSINGS) IMPLANT
CANISTER SUCT 1200ML W/VALVE (MISCELLANEOUS) ×2 IMPLANT
CNTNR URN SCR LID CUP LEK RST (MISCELLANEOUS) ×2 IMPLANT
CONT SPEC 4OZ STRL OR WHT (MISCELLANEOUS) ×4
COVER BACK TABLE 60X90IN (DRAPES) ×2 IMPLANT
COVER MAYO STAND STRL (DRAPES) ×2 IMPLANT
COVER WAND RF STERILE (DRAPES) IMPLANT
DECANTER SPIKE VIAL GLASS SM (MISCELLANEOUS) IMPLANT
DERMABOND ADVANCED (GAUZE/BANDAGES/DRESSINGS) ×1
DERMABOND ADVANCED .7 DNX12 (GAUZE/BANDAGES/DRESSINGS) ×1 IMPLANT
DRAIN CHANNEL 15F RND FF W/TCR (WOUND CARE) ×4 IMPLANT
DRAIN CHANNEL 19F RND (DRAIN) IMPLANT
DRAPE LAPAROSCOPIC ABDOMINAL (DRAPES) ×2 IMPLANT
DRSG OPSITE POSTOP 4X6 (GAUZE/BANDAGES/DRESSINGS) ×2 IMPLANT
DRSG PAD ABDOMINAL 8X10 ST (GAUZE/BANDAGES/DRESSINGS) ×2 IMPLANT
ELECT BLADE 4.0 EZ CLEAN MEGAD (MISCELLANEOUS)
ELECT BLADE 6.5 EXT (BLADE) ×2 IMPLANT
ELECT REM PT RETURN 9FT ADLT (ELECTROSURGICAL) ×2
ELECTRODE BLDE 4.0 EZ CLN MEGD (MISCELLANEOUS) IMPLANT
ELECTRODE REM PT RTRN 9FT ADLT (ELECTROSURGICAL) ×1 IMPLANT
EVACUATOR SILICONE 100CC (DRAIN) ×4 IMPLANT
GLOVE SURG ENC MOIS LTX SZ6.5 (GLOVE) ×8 IMPLANT
GOWN STRL REUS W/ TWL LRG LVL3 (GOWN DISPOSABLE) ×3 IMPLANT
GOWN STRL REUS W/TWL LRG LVL3 (GOWN DISPOSABLE) ×6
IV NS 1000ML (IV SOLUTION)
IV NS 1000ML BAXH (IV SOLUTION) IMPLANT
IV NS 500ML (IV SOLUTION)
IV NS 500ML BAXH (IV SOLUTION) IMPLANT
KIT FILL SYSTEM UNIVERSAL (SET/KITS/TRAYS/PACK) IMPLANT
NDL SAFETY ECLIPSE 18X1.5 (NEEDLE) ×1 IMPLANT
NEEDLE HYPO 18GX1.5 SHARP (NEEDLE) ×2
NEEDLE HYPO 25X1 1.5 SAFETY (NEEDLE) ×2 IMPLANT
NS IRRIG 1000ML POUR BTL (IV SOLUTION) IMPLANT
PACK BASIN DAY SURGERY FS (CUSTOM PROCEDURE TRAY) ×2 IMPLANT
PENCIL SMOKE EVACUATOR (MISCELLANEOUS) ×2 IMPLANT
PIN SAFETY STERILE (MISCELLANEOUS) IMPLANT
SLEEVE SCD COMPRESS KNEE MED (MISCELLANEOUS) ×2 IMPLANT
SPONGE LAP 18X18 RF (DISPOSABLE) ×4 IMPLANT
STAPLER VISISTAT 35W (STAPLE) ×2 IMPLANT
STRIP CLOSURE SKIN 1/2X4 (GAUZE/BANDAGES/DRESSINGS) ×2 IMPLANT
STRIP SUTURE WOUND CLOSURE 1/2 (MISCELLANEOUS) IMPLANT
SUT MNCRL AB 4-0 PS2 18 (SUTURE) ×4 IMPLANT
SUT MON AB 3-0 SH 27 (SUTURE) ×4
SUT MON AB 3-0 SH27 (SUTURE) ×2 IMPLANT
SUT MON AB 5-0 PS2 18 (SUTURE) ×4 IMPLANT
SUT PDS AB 2-0 CT2 27 (SUTURE) IMPLANT
SUT PROLENE 3 0 PS 2 (SUTURE) IMPLANT
SUT SILK 3 0 PS 1 (SUTURE) ×4 IMPLANT
SUT VIC AB 3-0 SH 27 (SUTURE)
SUT VIC AB 3-0 SH 27X BRD (SUTURE) IMPLANT
SUT VICRYL 4-0 PS2 18IN ABS (SUTURE) IMPLANT
SWAB COLLECTION DEVICE MRSA (MISCELLANEOUS) IMPLANT
SWAB CULTURE ESWAB REG 1ML (MISCELLANEOUS) IMPLANT
SYR 50ML LL SCALE MARK (SYRINGE) IMPLANT
SYR BULB IRRIG 60ML STRL (SYRINGE) ×2 IMPLANT
SYR CONTROL 10ML LL (SYRINGE) ×2 IMPLANT
TOWEL GREEN STERILE FF (TOWEL DISPOSABLE) ×4 IMPLANT
TRAY DSU PREP LF (CUSTOM PROCEDURE TRAY) ×2 IMPLANT
TUBE CONNECTING 20X1/4 (TUBING) ×2 IMPLANT
UNDERPAD 30X36 HEAVY ABSORB (UNDERPADS AND DIAPERS) ×4 IMPLANT
YANKAUER SUCT BULB TIP NO VENT (SUCTIONS) ×2 IMPLANT

## 2020-12-10 NOTE — Op Note (Signed)
DATE OF OPERATION: 12/10/2020  LOCATION: [facility]  PREOPERATIVE DIAGNOSIS: Ruptured breast implants  POSTOPERATIVE DIAGNOSIS: Same  PROCEDURE: Removal of bilateral ruptured silicone breast implants  SURGEON: Lyndee Leo Sanger Sue Mcalexander, DO  ASSISTANT: Phoebe Sharps, PA  EBL: 5 cc  CONDITION: Stable  COMPLICATIONS: None  INDICATION: The patient, Sarah Walsh, is a 65 y.o. female born on Feb 08, 1956, is here for treatment of ruptured implants. The patient had preop imaging that showed a left breast implant rupture. As far as she was aware they were submammary and silicone implants.  PROCEDURE DETAILS:  The patient was seen prior to surgery and marked.  The IV antibiotics were given. The patient was taken to the operating room and given a general anesthetic. A standard time out was performed and all information was confirmed by those in the room. SCDs were placed.   The patient was prepped and draped. Local anesthetic was placed at the inframammary fold.  Right: The #15 blade was used to make a 4 cm incision. The Bovie was then used to dissect down to the capsule. The capsule was released from the surrounding soft tissue. The capsule and implant were above the muscle. The implant did appear to be ruptured. The entire capsule and implant were removed. The pocket was irrigated with antibiotic solution and saline. Hemostasis was achieved with electrocautery. A #15 Blake drain was placed and secured with a 3-0 silk. The deep layers were closed with 3-0 Monocryl. 4-0 Monocryl was then used and the skin was closed with 5-0 Monocryl.  Left: The #15 blade was used to make a 4 cm incision. The Bovie was then used to dissect down to the capsule. The capsule was released from the surrounding soft tissue. The capsule and implant were above the muscle. The implant was clearly ruptured. The entire capsule and implant were removed. It was very irregular and adherent to the surrounding tissue.  The pocket was irrigated with antibiotic solution and saline. Hemostasis was achieved with electrocautery. A #15 Blake drain was placed and secured with a 3-0 silk. The deep layers were closed with 3-0 Monocryl. 4-0 Monocryl was then used and the skin was closed with 5-0 Monocryl.   Dermabond and Steri-Strips were applied. A sterile dressing was placed. The patient was allowed to wake up and taken to recovery room in stable condition at the end of the case. The family was notified at the end of the case. A picture of the implant was placed in the patient's chart.  The advanced practice practitioner (APP) assisted throughout the case.  The APP was essential in retraction and counter traction when needed to make the case progress smoothly.  This retraction and assistance made it possible to see the tissue plans for the procedure.  The assistance was needed for blood control, tissue re-approximation and assisted with closure of the incision site.

## 2020-12-10 NOTE — Anesthesia Preprocedure Evaluation (Addendum)
Anesthesia Evaluation  Patient identified by MRN, date of birth, ID band Patient awake    Reviewed: Allergy & Precautions, H&P , NPO status , Patient's Chart, lab work & pertinent test results  Airway Mallampati: II  TM Distance: >3 FB Neck ROM: Full    Dental no notable dental hx.    Pulmonary neg pulmonary ROS, former smoker,    Pulmonary exam normal breath sounds clear to auscultation       Cardiovascular hypertension, negative cardio ROS Normal cardiovascular exam Rhythm:Regular Rate:Normal     Neuro/Psych PSYCHIATRIC DISORDERS Depression negative neurological ROS     GI/Hepatic negative GI ROS, Neg liver ROS,   Endo/Other  negative endocrine ROSdiabetes  Renal/GU negative Renal ROS  negative genitourinary   Musculoskeletal  (+) Arthritis ,   Abdominal   Peds negative pediatric ROS (+)  Hematology negative hematology ROS (+)   Anesthesia Other Findings   Reproductive/Obstetrics negative OB ROS                             Anesthesia Physical Anesthesia Plan  ASA: III  Anesthesia Plan: General   Post-op Pain Management:    Induction:   PONV Risk Score and Plan: 3 and Midazolam, Ondansetron, Dexamethasone and Treatment may vary due to age or medical condition  Airway Management Planned: Oral ETT  Additional Equipment:   Intra-op Plan:   Post-operative Plan: Extubation in OR  Informed Consent: I have reviewed the patients History and Physical, chart, labs and discussed the procedure including the risks, benefits and alternatives for the proposed anesthesia with the patient or authorized representative who has indicated his/her understanding and acceptance.     Dental advisory given  Plan Discussed with: CRNA, Anesthesiologist and Surgeon  Anesthesia Plan Comments:         Anesthesia Quick Evaluation

## 2020-12-10 NOTE — Anesthesia Postprocedure Evaluation (Signed)
Anesthesia Post Note  Patient: Sarah Walsh  Procedure(s) Performed: REMOVAL BREAST IMPLANTS (Bilateral Breast) CAPSULECTOMY (Bilateral Breast)     Patient location during evaluation: PACU Anesthesia Type: General Level of consciousness: awake and alert Pain management: pain level controlled Vital Signs Assessment: post-procedure vital signs reviewed and stable Respiratory status: spontaneous breathing, nonlabored ventilation and respiratory function stable Cardiovascular status: blood pressure returned to baseline and stable Postop Assessment: no apparent nausea or vomiting Anesthetic complications: no   No complications documented.  Last Vitals:  Vitals:   12/10/20 0929 12/10/20 0944  BP: 130/80 140/80  Pulse: 86 87  Resp: 19 18  Temp:  36.9 C  SpO2: 94% 96%    Last Pain:  Vitals:   12/10/20 0944  TempSrc:   PainSc: 3                  Candra R Taiana Temkin

## 2020-12-10 NOTE — Discharge Instructions (Signed)
INSTRUCTIONS FOR AFTER SURGERY   You will likely have some questions about what to expect following your operation.  The following information will help you and your family understand what to expect when you are discharged from the hospital.  Following these guidelines will help ensure a smooth recovery and reduce risks of complications.  Postoperative instructions include information on: diet, wound care, medications and physical activity.  AFTER SURGERY Expect to go home after the procedure.  In some cases, you may need to spend one night in the hospital for observation.  DIET This surgery does not require a specific diet.  However, I have to mention that the healthier you eat the better your body can start healing. It is important to increasing your protein intake.  This means limiting the foods with added sugar.  Focus on fruits and vegetables and some meat. It is very important to drink water after your surgery.  If your urine is bright yellow, then it is concentrated, and you need to drink more water.  As a general rule after surgery, you should have 8 ounces of water every hour while awake.  If you find you are persistently nauseated or unable to take in liquids let us know.  NO TOBACCO USE or EXPOSURE.  This will slow your healing process and increase the risk of a wound.  WOUND CARE If you have a drain: Clean with baby wipes until the drain is removed or for at least 3 days.   If you have steri-strips / tape directly attached to your skin leave them in place. It is OK to get these wet.  No baths, pools or hot tubs for two weeks. We close your incision to leave the smallest and best-looking scar. No ointment or creams on your incisions until given the go ahead.  Especially not Neosporin (Too many skin reactions with this one).  A few weeks after surgery you can use Mederma and start massaging the scar. We ask you to wear your binder or sports bra for the first 6 weeks around the clock,  including while sleeping. This provides added comfort and helps reduce the fluid accumulation at the surgery site.  ACTIVITY No heavy lifting until cleared by the doctor.  It is OK to walk and climb stairs. In fact, moving your legs is very important to decrease your risk of a blood clot.  It will also help keep you from getting deconditioned.  Every 1 to 2 hours get up and walk for 5 minutes. This will help with a quicker recovery back to normal.  Let pain be your guide so you don't do too much.  NO, you cannot do the spring cleaning and don't plan on taking care of anyone else.  This is your time for TLC.   WORK Everyone returns to work at different times. As a rough guide, most people take at least 1 - 2 weeks off prior to returning to work. If you need documentation for your job, bring the forms to your postoperative follow up visit.  DRIVING Arrange for someone to bring you home from the hospital.  You may be able to drive a few days after surgery but not while taking any narcotics or valium.  BOWEL MOVEMENTS Constipation can occur after anesthesia and while taking pain medication.  It is important to stay ahead for your comfort.  We recommend taking Milk of Magnesia (2 tablespoons; twice a day) while taking the pain pills.  SEROMA This is fluid your  body tried to put in the surgical site.  This is normal but if it creates excessive pain and swelling let us know.  It usually decreases in a few weeks.  MEDICATIONS and PAIN CONTROL At your preoperative visit for you history and physical you were given the following medications: 1. An antibiotic: Start this medication when you get home and take according to the instructions on the bottle. 2. Zofran 4 mg:  This is to treat nausea and vomiting.  You can take this every 6 hours as needed and only if needed. 3. Norco (hydrocodone/acetaminophen) 5/325 mg:  This is only to be used after you have taken the motrin or the tylenol. Every 8 hours as  needed. Over the counter Medication to take: 4. Ibuprofen (Motrin) 600 mg:  Take this every 6 hours.  If you have additional pain then take 500 mg of the tylenol.  Only take the Norco after you have tried these two. 5. Miralax or stool softener of choice: Take this according to the bottle if you take the [place] Call your surgeon's office if any of the following occur: . Fever 101 degrees F or greater . Excessive bleeding or fluid from the incision site. . Pain that increases over time without aid from the medications . Redness, warmth, or pus draining from incision sites . Persistent nausea or inability to take in liquids . Severe misshapen area that underwent the operation.   [facility] Plastic Surgery Specialist  What is the benefit of having a drain?  During surgery your tissue layers are separated.  This raw surface stimulates your body to fill the space with serous fluid.  This is normal but you don't want that fluid to collect and prevent healing.  A fluid collection can also become infected.  The Jackson-Pratt (JP) drain is used to eliminate this collection of fluid and allow the tissue to heal together.    Jackson-Pratt (JP) bulb    How to care for your drainage and suction unit at home Your drainage catheter will be connected to a collection device. The vacuum caused when the device is compressed allows drainage to collect in the device.    Wendee Copp your hands with soap and water before and after touching the system. . Empty the JP drain every 12 hours once you get home from your procedure. . Record the fluid amount on the record sheet included. . Start with stripping the drain tube to push the clots or excess fluid to the bulb.  Do this by pinching the tube with one hand near your skin.  Then with the other hand squeeze the tubing and work it toward the bulb.  This should be done several times a day.  This may collapse the tube which will correct on its own.   . Use a  safety pin to attach your collection device to your clothing so there is no tension on the insertion site.   . If you have drainage at the skin insertion site, you can apply a gauze dressing and secure it with tape. . If the drain falls out, apply a gauze dressing over the drain insertion site and secure with tape.   To empty the collection device:   . Release the stopper on the top of the collection unit (bulb).  Signa Kell contents into a measuring container such as a plastic medicine cup.  . Record the day and amount of drainage on the attached sheet. . This should  be done at least twice a day.    To compress the Jackson-Pratt Bulb:  . Release the stopper at the top of the bulb. Marland Kitchen Squeeze the bulb tightly in your fist, squeezing air out of the bulb.  . Replace the stopper while the bulb is compressed.  . Be careful not to spill the contents when squeezing the bulb. . The drainage will start bright red and turn to pink and then yellow with time. . IMPORTANT: If the bulb is not squeezed before adding the stopper it will not draw out the fluid.  Care for the JP drain site and your skin daily:  . You may shower three days after surgery. . Secure the drain to a ribbon or cloth around your waist while showering so it does not pull out while showering. . Be sure your hands are cleaned with soap and water. . Use a clean wet cotton swab to clean the skin around the drain site.  . Use another cotton swab to place Vaseline or antibiotic ointment on the skin around the drain.     Contact your physician if any of the following occur:  Marland Kitchen The fluid in the bulb becomes cloudy. . Your temperature is greater than 101.4.  Marland Kitchen The incision opens. . If you have drainage at the skin insertion site, you can apply a gauze dressing and secure it with tape. . If the drain falls out, apply a gauze dressing over the drain insertion site and secure with tape.  . You will usually have more drainage when you are active  than while you rest or are asleep. If the drainage increases significantly or is bloody call the physician                             Bring this record with you to each office visit Date  Drainage Volume  Date   Drainage volume                                                                                                                                                                                          May take Tylenol after 2:40pm, if needed.    Post Anesthesia Home Care Instructions  Activity: Get plenty of rest for the remainder of the day. A responsible individual must stay with you for 24 hours following the procedure.  For the next 24 hours, DO NOT: -Drive a car -Paediatric nurse -Drink alcoholic beverages -Take any medication unless instructed by your physician -Make any legal decisions or sign important papers.  Meals: Start with liquid foods such  as gelatin or soup. Progress to regular foods as tolerated. Avoid greasy, spicy, heavy foods. If nausea and/or vomiting occur, drink only clear liquids until the nausea and/or vomiting subsides. Call your physician if vomiting continues.  Special Instructions/Symptoms: Your throat may feel dry or sore from the anesthesia or the breathing tube placed in your throat during surgery. If this causes discomfort, gargle with warm salt water. The discomfort should disappear within 24 hours.  If you had a scopolamine patch placed behind your ear for the management of post- operative nausea and/or vomiting:  1. The medication in the patch is effective for 72 hours, after which it should be removed.  Wrap patch in a tissue and discard in the trash. Wash hands thoroughly with soap and water. 2. You may remove the patch earlier than 72 hours if you experience unpleasant side effects which may include dry mouth, dizziness or visual disturbances. 3. Avoid touching the patch. Wash your hands with soap and water after  contact with the patch.

## 2020-12-10 NOTE — Anesthesia Procedure Notes (Addendum)
Procedure Name: LMA Insertion Date/Time: 12/10/2020 7:42 AM Performed by: Willa Frater, CRNA Pre-anesthesia Checklist: Patient identified, Emergency Drugs available, Suction available and Patient being monitored Patient Re-evaluated:Patient Re-evaluated prior to induction Oxygen Delivery Method: Circle system utilized Preoxygenation: Pre-oxygenation with 100% oxygen Induction Type: IV induction Ventilation: Mask ventilation without difficulty LMA: LMA inserted LMA Size: 4.0 Number of attempts: 1 Airway Equipment and Method: Bite block Placement Confirmation: positive ETCO2 Tube secured with: Tape Dental Injury: Teeth and Oropharynx as per pre-operative assessment

## 2020-12-10 NOTE — Transfer of Care (Signed)
Immediate Anesthesia Transfer of Care Note  Patient: Sarah Walsh  Procedure(s) Performed: REMOVAL BREAST IMPLANTS (Bilateral Breast) CAPSULECTOMY (Bilateral Breast)  Patient Location: PACU  Anesthesia Type:General  Level of Consciousness: awake, alert , oriented, drowsy and patient cooperative  Airway & Oxygen Therapy: Patient Spontanous Breathing and Patient connected to face mask oxygen  Post-op Assessment: Report given to RN and Post -op Vital signs reviewed and stable  Post vital signs: Reviewed and stable  Last Vitals:  Vitals Value Taken Time  BP    Temp    Pulse 96 12/10/20 0907  Resp    SpO2 97 % 12/10/20 0907  Vitals shown include unvalidated device data.  Last Pain:  Vitals:   12/10/20 0644  TempSrc: Oral  PainSc: 4       Patients Stated Pain Goal: 4 (62/19/47 1252)  Complications: No complications documented.

## 2020-12-10 NOTE — Interval H&P Note (Signed)
History and Physical Interval Note:  12/10/2020 7:21 AM  Sarah Walsh  has presented today for surgery, with the diagnosis of breast implant rupture.  The various methods of treatment have been discussed with the patient and family. After consideration of risks, benefits and other options for treatment, the patient has consented to  Procedure(s) with comments: REMOVAL BREAST IMPLANTS (Bilateral) - 2 hours please CAPSULECTOMY (Bilateral) as a surgical intervention.  The patient's history has been reviewed, patient examined, no change in status, stable for surgery.  I have reviewed the patient's chart and labs.  Questions were answered to the patient's satisfaction.     Loel Lofty Dorthia Tout

## 2020-12-11 LAB — SURGICAL PATHOLOGY

## 2020-12-14 ENCOUNTER — Ambulatory Visit (INDEPENDENT_AMBULATORY_CARE_PROVIDER_SITE_OTHER): Payer: Medicare HMO | Admitting: Rheumatology

## 2020-12-14 ENCOUNTER — Encounter: Payer: Self-pay | Admitting: Rheumatology

## 2020-12-14 ENCOUNTER — Ambulatory Visit: Payer: Self-pay

## 2020-12-14 ENCOUNTER — Other Ambulatory Visit: Payer: Self-pay

## 2020-12-14 VITALS — BP 128/86 | HR 82 | Resp 15 | Ht 61.5 in | Wt 176.8 lb

## 2020-12-14 DIAGNOSIS — M069 Rheumatoid arthritis, unspecified: Secondary | ICD-10-CM

## 2020-12-14 DIAGNOSIS — M170 Bilateral primary osteoarthritis of knee: Secondary | ICD-10-CM | POA: Diagnosis not present

## 2020-12-14 DIAGNOSIS — Z9886 Personal history of breast implant removal: Secondary | ICD-10-CM

## 2020-12-14 DIAGNOSIS — M79604 Pain in right leg: Secondary | ICD-10-CM

## 2020-12-14 DIAGNOSIS — I10 Essential (primary) hypertension: Secondary | ICD-10-CM

## 2020-12-14 DIAGNOSIS — E559 Vitamin D deficiency, unspecified: Secondary | ICD-10-CM

## 2020-12-14 DIAGNOSIS — M25462 Effusion, left knee: Secondary | ICD-10-CM | POA: Diagnosis not present

## 2020-12-14 DIAGNOSIS — M25562 Pain in left knee: Secondary | ICD-10-CM | POA: Diagnosis not present

## 2020-12-14 DIAGNOSIS — T466X5A Adverse effect of antihyperlipidemic and antiarteriosclerotic drugs, initial encounter: Secondary | ICD-10-CM

## 2020-12-14 DIAGNOSIS — M791 Myalgia, unspecified site: Secondary | ICD-10-CM

## 2020-12-14 DIAGNOSIS — G8929 Other chronic pain: Secondary | ICD-10-CM | POA: Diagnosis not present

## 2020-12-14 DIAGNOSIS — M5136 Other intervertebral disc degeneration, lumbar region: Secondary | ICD-10-CM

## 2020-12-14 DIAGNOSIS — M79605 Pain in left leg: Secondary | ICD-10-CM

## 2020-12-14 DIAGNOSIS — Z789 Other specified health status: Secondary | ICD-10-CM

## 2020-12-14 DIAGNOSIS — E782 Mixed hyperlipidemia: Secondary | ICD-10-CM

## 2020-12-14 DIAGNOSIS — M51369 Other intervertebral disc degeneration, lumbar region without mention of lumbar back pain or lower extremity pain: Secondary | ICD-10-CM

## 2020-12-14 DIAGNOSIS — Z860100 Personal history of colon polyps, unspecified: Secondary | ICD-10-CM

## 2020-12-14 DIAGNOSIS — E1169 Type 2 diabetes mellitus with other specified complication: Secondary | ICD-10-CM

## 2020-12-14 DIAGNOSIS — E119 Type 2 diabetes mellitus without complications: Secondary | ICD-10-CM

## 2020-12-14 DIAGNOSIS — M25561 Pain in right knee: Secondary | ICD-10-CM

## 2020-12-14 DIAGNOSIS — M222X1 Patellofemoral disorders, right knee: Secondary | ICD-10-CM

## 2020-12-14 DIAGNOSIS — M222X2 Patellofemoral disorders, left knee: Secondary | ICD-10-CM

## 2020-12-14 DIAGNOSIS — T8543XD Leakage of breast prosthesis and implant, subsequent encounter: Secondary | ICD-10-CM

## 2020-12-14 DIAGNOSIS — M4726 Other spondylosis with radiculopathy, lumbar region: Secondary | ICD-10-CM

## 2020-12-14 DIAGNOSIS — F329 Major depressive disorder, single episode, unspecified: Secondary | ICD-10-CM

## 2020-12-14 MED ORDER — LIDOCAINE HCL 1 % IJ SOLN
1.5000 mL | INTRAMUSCULAR | Status: AC | PRN
  Administered 2020-12-14: 1.5 mL

## 2020-12-14 NOTE — Patient Instructions (Signed)
Journal for Nurse Practitioners, 15(4), 263-267. Retrieved July 30, 2018 from http://clinicalkey.com/nursing">  Knee Exercises Ask your health care provider which exercises are safe for you. Do exercises exactly as told by your health care provider and adjust them as directed. It is normal to feel mild stretching, pulling, tightness, or discomfort as you do these exercises. Stop right away if you feel sudden pain or your pain gets worse. Do not begin these exercises until told by your health care provider. Stretching and range-of-motion exercises These exercises warm up your muscles and joints and improve the movement and flexibility of your knee. These exercises also help to relieve pain and swelling. Knee extension, prone 1. Lie on your abdomen (prone position) on a bed. 2. Place your left / right knee just beyond the edge of the surface so your knee is not on the bed. You can put a towel under your left / right thigh just above your kneecap for comfort. 3. Relax your leg muscles and allow gravity to straighten your knee (extension). You should feel a stretch behind your left / right knee. 4. Hold this position for __________ seconds. 5. Scoot up so your knee is supported between repetitions. Repeat __________ times. Complete this exercise __________ times a day. Knee flexion, active 1. Lie on your back with both legs straight. If this causes back discomfort, bend your left / right knee so your foot is flat on the floor. 2. Slowly slide your left / right heel back toward your buttocks. Stop when you feel a gentle stretch in the front of your knee or thigh (flexion). 3. Hold this position for __________ seconds. 4. Slowly slide your left / right heel back to the starting position. Repeat __________ times. Complete this exercise __________ times a day.   Quadriceps stretch, prone 1. Lie on your abdomen on a firm surface, such as a bed or padded floor. 2. Bend your left / right knee and hold  your ankle. If you cannot reach your ankle or pant leg, loop a belt around your foot and grab the belt instead. 3. Gently pull your heel toward your buttocks. Your knee should not slide out to the side. You should feel a stretch in the front of your thigh and knee (quadriceps). 4. Hold this position for __________ seconds. Repeat __________ times. Complete this exercise __________ times a day.   Hamstring, supine 1. Lie on your back (supine position). 2. Loop a belt or towel over the ball of your left / right foot. The ball of your foot is on the walking surface, right under your toes. 3. Straighten your left / right knee and slowly pull on the belt to raise your leg until you feel a gentle stretch behind your knee (hamstring). ? Do not let your knee bend while you do this. ? Keep your other leg flat on the floor. 4. Hold this position for __________ seconds. Repeat __________ times. Complete this exercise __________ times a day. Strengthening exercises These exercises build strength and endurance in your knee. Endurance is the ability to use your muscles for a long time, even after they get tired. Quadriceps, isometric This exercise stretches the muscles in front of your thigh (quadriceps) without moving your knee joint (isometric). 1. Lie on your back with your left / right leg extended and your other knee bent. Put a rolled towel or small pillow under your knee if told by your health care provider. 2. Slowly tense the muscles in the front of your   left / right thigh. You should see your kneecap slide up toward your hip or see increased dimpling just above the knee. This motion will push the back of the knee toward the floor. 3. For __________ seconds, hold the muscle as tight as you can without increasing your pain. 4. Relax the muscles slowly and completely. Repeat __________ times. Complete this exercise __________ times a day.   Straight leg raises This exercise stretches the muscles in  front of your thigh (quadriceps) and the muscles that move your hips (hip flexors). 1. Lie on your back with your left / right leg extended and your other knee bent. 2. Tense the muscles in the front of your left / right thigh. You should see your kneecap slide up or see increased dimpling just above the knee. Your thigh may even shake a bit. 3. Keep these muscles tight as you raise your leg 4-6 inches (10-15 cm) off the floor. Do not let your knee bend. 4. Hold this position for __________ seconds. 5. Keep these muscles tense as you lower your leg. 6. Relax your muscles slowly and completely after each repetition. Repeat __________ times. Complete this exercise __________ times a day. Hamstring, isometric 1. Lie on your back on a firm surface. 2. Bend your left / right knee about __________ degrees. 3. Dig your left / right heel into the surface as if you are trying to pull it toward your buttocks. Tighten the muscles in the back of your thighs (hamstring) to "dig" as hard as you can without increasing any pain. 4. Hold this position for __________ seconds. 5. Release the tension gradually and allow your muscles to relax completely for __________ seconds after each repetition. Repeat __________ times. Complete this exercise __________ times a day. Hamstring curls If told by your health care provider, do this exercise while wearing ankle weights. Begin with __________ lb weights. Then increase the weight by 1 lb (0.5 kg) increments. Do not wear ankle weights that are more than __________ lb. 1. Lie on your abdomen with your legs straight. 2. Bend your left / right knee as far as you can without feeling pain. Keep your hips flat against the floor. 3. Hold this position for __________ seconds. 4. Slowly lower your leg to the starting position. Repeat __________ times. Complete this exercise __________ times a day.   Squats This exercise strengthens the muscles in front of your thigh and knee  (quadriceps). 1. Stand in front of a table, with your feet and knees pointing straight ahead. You may rest your hands on the table for balance but not for support. 2. Slowly bend your knees and lower your hips like you are going to sit in a chair. ? Keep your weight over your heels, not over your toes. ? Keep your lower legs upright so they are parallel with the table legs. ? Do not let your hips go lower than your knees. ? Do not bend lower than told by your health care provider. ? If your knee pain increases, do not bend as low. 3. Hold the squat position for __________ seconds. 4. Slowly push with your legs to return to standing. Do not use your hands to pull yourself to standing. Repeat __________ times. Complete this exercise __________ times a day. Wall slides This exercise strengthens the muscles in front of your thigh and knee (quadriceps). 1. Lean your back against a smooth wall or door, and walk your feet out 18-24 inches (46-61 cm) from it. 2.   Place your feet hip-width apart. 3. Slowly slide down the wall or door until your knees bend __________ degrees. Keep your knees over your heels, not over your toes. Keep your knees in line with your hips. 4. Hold this position for __________ seconds. Repeat __________ times. Complete this exercise __________ times a day.   Straight leg raises This exercise strengthens the muscles that rotate the leg at the hip and move it away from your body (hip abductors). 1. Lie on your side with your left / right leg in the top position. Lie so your head, shoulder, knee, and hip line up. You may bend your bottom knee to help you keep your balance. 2. Roll your hips slightly forward so your hips are stacked directly over each other and your left / right knee is facing forward. 3. Leading with your heel, lift your top leg 4-6 inches (10-15 cm). You should feel the muscles in your outer hip lifting. ? Do not let your foot drift forward. ? Do not let your  knee roll toward the ceiling. 4. Hold this position for __________ seconds. 5. Slowly return your leg to the starting position. 6. Let your muscles relax completely after each repetition. Repeat __________ times. Complete this exercise __________ times a day.   Straight leg raises This exercise stretches the muscles that move your hips away from the front of the pelvis (hip extensors). 1. Lie on your abdomen on a firm surface. You can put a pillow under your hips if that is more comfortable. 2. Tense the muscles in your buttocks and lift your left / right leg about 4-6 inches (10-15 cm). Keep your knee straight as you lift your leg. 3. Hold this position for __________ seconds. 4. Slowly lower your leg to the starting position. 5. Let your leg relax completely after each repetition. Repeat __________ times. Complete this exercise __________ times a day. This information is not intended to replace advice given to you by your health care provider. Make sure you discuss any questions you have with your health care provider. Document Revised: 07/31/2018 Document Reviewed: 07/31/2018 Elsevier Patient Education  2021 [employer]  

## 2020-12-15 ENCOUNTER — Encounter: Payer: Medicare HMO | Admitting: Gastroenterology

## 2020-12-18 ENCOUNTER — Other Ambulatory Visit: Payer: Self-pay

## 2020-12-18 ENCOUNTER — Ambulatory Visit (INDEPENDENT_AMBULATORY_CARE_PROVIDER_SITE_OTHER): Payer: Medicare HMO | Admitting: Plastic Surgery

## 2020-12-18 ENCOUNTER — Encounter: Payer: Self-pay | Admitting: Plastic Surgery

## 2020-12-18 VITALS — BP 127/81 | HR 68

## 2020-12-18 DIAGNOSIS — T8543XA Leakage of breast prosthesis and implant, initial encounter: Secondary | ICD-10-CM

## 2020-12-18 NOTE — Progress Notes (Signed)
Patient is a 65 year old female here for follow-up on removal of her implants.  Overall she is doing really well.  The left drain was clogged and I was able to get it unclogged and drained about 25 cc.  I went ahead and removed both drains.  The incisions are looking good.  No sign of infection or redness.  I went ahead and remove the dressings as they were a little bit wet.  Continue with the sports bra.  Increase activity slowly.  I had like to see her back in 2 weeks.

## 2020-12-19 NOTE — Progress Notes (Signed)
I do not see synovial fluid analysis-which should include cell count and crystals.  Please check with the lab.

## 2020-12-20 LAB — ANAEROBIC AND AEROBIC CULTURE
AER RESULT:: NO GROWTH
MICRO NUMBER:: 11558791
MICRO NUMBER:: 11558792
SPECIMEN QUALITY:: ADEQUATE
SPECIMEN QUALITY:: ADEQUATE

## 2020-12-20 LAB — CYCLIC CITRUL PEPTIDE ANTIBODY, IGG: Cyclic Citrullin Peptide Ab: <16 UNITS

## 2020-12-20 LAB — RHEUMATOID FACTOR: Rheumatoid fact SerPl-aCnc: <14 IU/mL (ref <14)

## 2020-12-20 LAB — URIC ACID: Uric Acid, Serum: 5 mg/dL (ref 2.5–7.0)

## 2020-12-20 NOTE — Progress Notes (Signed)
Rheumatoid factor, anti-CCP, uric acid are within normal limits.

## 2020-12-21 ENCOUNTER — Encounter: Payer: Self-pay | Admitting: Gastroenterology

## 2020-12-28 NOTE — Progress Notes (Signed)
Error

## 2021-01-01 ENCOUNTER — Encounter: Payer: Self-pay | Admitting: Surgical

## 2021-01-01 ENCOUNTER — Other Ambulatory Visit: Payer: Self-pay

## 2021-01-01 ENCOUNTER — Ambulatory Visit (INDEPENDENT_AMBULATORY_CARE_PROVIDER_SITE_OTHER): Payer: Medicare HMO | Admitting: Surgical

## 2021-01-01 VITALS — BP 120/77 | HR 88

## 2021-01-01 DIAGNOSIS — T8543XD Leakage of breast prosthesis and implant, subsequent encounter: Secondary | ICD-10-CM

## 2021-01-01 NOTE — Progress Notes (Signed)
Patient is a 65 year old female here for follow-up after removal of bilateral breast implants with Dr. Marla Roe on 12/10/2020.  She reports she is overall doing well, she reports some itching over the incisions but otherwise she feels great.  Chaperone present on exam On exam everything appears to be healing well the incisions are intact, no redness or signs infection are noted.  No swelling is noted.  Bilateral NAC's are viable with good color.  I recommend continuing with the sports bra, avoiding strenuous activity, recommend 2 more weeks of restrictions. We discussed scheduling additional follow-up or following up on an as-needed basis, patient is comfortable following up as needed and reports she will be sure to call us with any questions or concerns.  There is no sign infection, seroma, hematoma.

## 2021-01-11 ENCOUNTER — Other Ambulatory Visit: Payer: Self-pay

## 2021-01-11 ENCOUNTER — Encounter (INDEPENDENT_AMBULATORY_CARE_PROVIDER_SITE_OTHER): Payer: Medicare HMO | Admitting: Rheumatology

## 2021-01-11 VITALS — Wt 173.2 lb

## 2021-01-11 DIAGNOSIS — M25462 Effusion, left knee: Secondary | ICD-10-CM

## 2021-01-11 DIAGNOSIS — M222X1 Patellofemoral disorders, right knee: Secondary | ICD-10-CM

## 2021-01-11 DIAGNOSIS — Z789 Other specified health status: Secondary | ICD-10-CM

## 2021-01-11 DIAGNOSIS — E119 Type 2 diabetes mellitus without complications: Secondary | ICD-10-CM

## 2021-01-11 DIAGNOSIS — Z9886 Personal history of breast implant removal: Secondary | ICD-10-CM

## 2021-01-11 DIAGNOSIS — M79604 Pain in right leg: Secondary | ICD-10-CM

## 2021-01-11 DIAGNOSIS — M791 Myalgia, unspecified site: Secondary | ICD-10-CM

## 2021-01-11 DIAGNOSIS — E1169 Type 2 diabetes mellitus with other specified complication: Secondary | ICD-10-CM

## 2021-01-11 DIAGNOSIS — E782 Mixed hyperlipidemia: Secondary | ICD-10-CM

## 2021-01-11 DIAGNOSIS — E559 Vitamin D deficiency, unspecified: Secondary | ICD-10-CM

## 2021-01-11 DIAGNOSIS — M79605 Pain in left leg: Secondary | ICD-10-CM

## 2021-01-11 DIAGNOSIS — Z86010 Personal history of colonic polyps: Secondary | ICD-10-CM

## 2021-01-11 DIAGNOSIS — G8929 Other chronic pain: Secondary | ICD-10-CM

## 2021-01-11 DIAGNOSIS — F329 Major depressive disorder, single episode, unspecified: Secondary | ICD-10-CM

## 2021-01-11 DIAGNOSIS — M21162 Varus deformity, not elsewhere classified, left knee: Secondary | ICD-10-CM

## 2021-01-11 DIAGNOSIS — T466X5A Adverse effect of antihyperlipidemic and antiarteriosclerotic drugs, initial encounter: Secondary | ICD-10-CM

## 2021-01-11 DIAGNOSIS — I10 Essential (primary) hypertension: Secondary | ICD-10-CM

## 2021-01-11 DIAGNOSIS — M222X2 Patellofemoral disorders, left knee: Secondary | ICD-10-CM

## 2021-01-11 DIAGNOSIS — M170 Bilateral primary osteoarthritis of knee: Secondary | ICD-10-CM

## 2021-01-11 DIAGNOSIS — M21161 Varus deformity, not elsewhere classified, right knee: Secondary | ICD-10-CM

## 2021-01-14 NOTE — Progress Notes (Signed)
Office Visit Note  Patient: Sarah Walsh             Date of Birth: May 12, 1956           MRN: [id number]             PCP: Patient, No Pcp Per (Inactive) Referring: Leamon Arnt, MD Visit Date: 01/27/2021 Occupation: _0 @  Subjective:  Pain in both knees and legs.   History of Present Illness: Sarah Walsh is a 65 y.o. female with history of severe osteoarthritis involving her knee joints.  She continues to have a lot of pain and discomfort in her knee joints.  She had left knee joint aspiration and injection in February and had good relief from that.  She has difficulty walking due to lower extremity pain.  She has discomfort behind the kneecap when she climbs the stairs.  She has had no recurrence of joint swelling.  Lower back pain comes and goes.    Activities of Daily Living:  Patient reports morning stiffness for 5 minutes to several  hours.   Patient Reports nocturnal pain.  Difficulty dressing/grooming: Denies Difficulty climbing stairs: Reports Difficulty getting out of chair: Reports Difficulty using hands for taps, buttons, cutlery, and/or writing: Denies  Review of Systems  Constitutional: Negative for fatigue.  HENT: Negative for mouth sores, mouth dryness and nose dryness.   Eyes: Negative for pain, itching and dryness.  Respiratory: Negative for shortness of breath and difficulty breathing.   Cardiovascular: Negative for chest pain and palpitations.  Gastrointestinal: Negative for blood in stool, constipation and diarrhea.  Endocrine: Negative for increased urination.  Genitourinary: Negative for difficulty urinating.  Musculoskeletal: Positive for arthralgias, joint pain, myalgias, muscle weakness, morning stiffness, muscle tenderness and myalgias. Negative for joint swelling.  Skin: Negative for color change, rash and redness.  Allergic/Immunologic: Negative for susceptible to infections.  Neurological: Positive for memory loss. Negative for dizziness,  numbness, headaches and weakness.  Hematological: Positive for bruising/bleeding tendency.  Psychiatric/Behavioral: Negative for confusion.    PMFS History:  Patient Active Problem List   Diagnosis Date Noted  . Breast implant rupture 10/13/2020  . Osteoarthritis of spine with radiculopathy, lumbar region 07/15/2020  . Myalgia due to statin 02/24/2020  . Reactive depression 11/06/2019  . Colon polyp 05/29/2019  . Controlled type 2 diabetes mellitus without complication, without long-term current use of insulin ([place]) 03/22/2019  . Patellofemoral pain syndrome of both knees 07/04/2018  . Lower extremity edema 03/03/2017  . Vitamin D deficiency 11/23/2015  . Combined hyperlipidemia associated with type 2 diabetes mellitus ([place]) 10/22/2015  . Primary osteoarthritis of both knees 10/22/2015  . Statin intolerance 10/22/2015  . Essential hypertension 11/05/2008    Past Medical History:  Diagnosis Date  . Arthritis    bilateral knees  . Colon polyp 05/29/2019  . Depression    on meds-situational anxiety  . Diabetes mellitus without complication ([place])    on meds  . Hyperlipemia    on meds  . Hypertension    hx of     Family History  Problem Relation Age of Onset  . Alzheimer's disease Mother   . Diabetes Daughter   . Healthy Daughter   . Diabetes Father   . Colon polyps Father   . Healthy Daughter   . Healthy Daughter   . Healthy Son   . Esophageal cancer Neg Hx   . Rectal cancer Neg Hx   . Stomach cancer Neg Hx   . Colon  cancer Neg Hx    Past Surgical History:  Procedure Laterality Date  . ABDOMINAL HYSTERECTOMY    . AUGMENTATION MAMMAPLASTY Bilateral    36 years   . BREAST IMPLANT REMOVAL Bilateral 12/10/2020   Procedure: REMOVAL BREAST IMPLANTS;  Surgeon: Wallace Going, DO;  Location: [facility];  Service: Plastics;  Laterality: Bilateral;  2 hours please  . CAPSULECTOMY Bilateral 12/10/2020   Procedure: CAPSULECTOMY;  Surgeon: Wallace Going, DO;  Location: [facility];  Service: Plastics;  Laterality: Bilateral;  . CESAREAN SECTION     4 times  . COLONOSCOPY  2020   Gupta-MAC-tics/hems/multiple polyps  . POLYPECTOMY  2020   multiple polyps  . WISDOM TOOTH EXTRACTION     Social History   Social History Narrative  . Not on file   Immunization History  Administered Date(s) Administered  . Fluad Quad(high Dose 65+) 08/28/2019, 07/15/2020  . Influenza, Seasonal, Injecte, Preservative Fre 10/22/2015, 07/15/2016  . Influenza,inj,Quad PF,6+ Mos 09/29/2017, 07/04/2018  . PFIZER(Purple Top)SARS-COV-2 Vaccination 01/09/2020, 02/03/2020, 11/18/2020  . Pneumococcal Conjugate-13 03/27/2019  . Pneumococcal Polysaccharide-23 10/22/2015, 11/06/2019  . Tdap 11/22/2010  . Zoster 11/23/2015  . Zoster Recombinat (Shingrix) 05/18/2018     Objective: Vital Signs: BP 110/76 (BP Location: Left Arm, Patient Position: Sitting, Cuff Size: Normal)   Pulse 90   Resp 15   Ht _0  (1.6 m)   Wt 176 lb (79.8 kg)   BMI 31.18 kg/m    Physical Exam Vitals and nursing note reviewed.  Constitutional:      Appearance: She is well-developed.  HENT:     Head: Normocephalic and atraumatic.  Eyes:     Conjunctiva/sclera: Conjunctivae normal.  Cardiovascular:     Rate and Rhythm: Normal rate and regular rhythm.     Heart sounds: Normal heart sounds.  Pulmonary:     Effort: Pulmonary effort is normal.     Breath sounds: Normal breath sounds.  Abdominal:     General: Bowel sounds are normal.     Palpations: Abdomen is soft.  Musculoskeletal:     Cervical back: Normal range of motion.  Lymphadenopathy:     Cervical: No cervical adenopathy.  Skin:    General: Skin is warm and dry.     Capillary Refill: Capillary refill takes less than 2 seconds.  Neurological:     Mental Status: She is alert and oriented to person, place, and time.  Psychiatric:        Behavior: Behavior normal.      Musculoskeletal Exam: C-spine was  in good range of motion.  Shoulder joints, elbow joints, wrist joints, MCPs PIPs and DIPs with good range of motion with no synovitis.  Hip joints with good range of motion.  She had painful range of motion of bilateral knee joints without any warmth swelling or effusion today.  She had crepitus in her bilateral knee joints.  There was no tenderness over ankles or MTPs.  CDAI Exam: CDAI Score: -- Patient Global: --; Provider Global: -- Swollen: --; Tender: -- Joint Exam 01/27/2021   No joint exam has been documented for this visit   There is currently no information documented on the homunculus. Go to the Rheumatology activity and complete the homunculus joint exam.  Investigation: No additional findings.  Imaging: No results found.  Recent Labs: Lab Results  Component Value Date   WBC 7.7 11/06/2019   HGB 13.8 11/06/2019   PLT 298.0 11/06/2019   NA 135 12/04/2020  K 4.6 12/04/2020   CL 100 12/04/2020   CO2 24 12/04/2020   GLUCOSE 94 12/04/2020   BUN 15 12/04/2020   CREATININE 0.83 12/04/2020   BILITOT 0.6 11/06/2019   ALKPHOS 46 11/06/2019   AST 11 11/06/2019   ALT 11 11/06/2019   PROT 7.6 11/06/2019   ALBUMIN 4.3 11/06/2019   CALCIUM 9.1 12/04/2020   GFRAA >90 01/12/2012   December 14, 2020 RF negative, anti-CCP negative, uric acid 5.0, synovial fluid culture negative cell count and crystals were not performed by the lab.  Speciality Comments: No specialty comments available.  Procedures:  No procedures performed Allergies: Statins   Assessment / Plan:     Visit Diagnoses: Chronic pain of both knees-she complains of pain and discomfort in her both knees.  She is trying some topical agents currently with 1 family member advised.  The pain has improved in her left knee after the knee joint aspiration and injection.  She is having a lot of difficulty walking and problems with mobility.  I gave her a handicap placard form.  Effusion, left knee - Aspirated and  injected on December 14, 2020.  Synovial fluid culture was negative.  Unfortunately the lab did not perform cell count and crystals.  All autoimmune work-up was negative.  Labs were discussed with the patient.  Primary osteoarthritis of both knees - radiographic findings and clinical findings are consistent with bilateral end-stage osteoarthritis and severe chondromalacia patella.  Detailed counsel regarding total knee replacement was provided.  She was convinced to move forward with total knee replacement.  Lower extremity muscle strength exercises were advised.  A handout on lower extremity exercises was given.  I will refer her to orthopedics.  Weight loss diet and exercise was emphasized.  Pain in both lower extremities - Due to severe osteoarthritis.  Patellofemoral pain syndrome of both knees-she has difficulty climbing stairs due to patellofemoral syndrome.  DDD (degenerative disc disease), lumbar - x-rays of her lumbar spine from May 17, 2020 which showed some degenerative changes.  She has intermittent pain.  Essential hypertension-blood pressure is normal today.  Controlled type 2 diabetes mellitus without complication, without long-term current use of insulin ([place]) - treated with Metformin.  Dietary modifications were discussed.  A handout was placed in the AVS to follow.  Combined hyperlipidemia associated with type 2 diabetes mellitus ([facility])  Myalgia due to statin  Statin intolerance  History of bilateral breast implants previously removed - December 10, 2020  due to rupture.   Hx of colonic polyps  Reactive depression-her daughter has stage IV cancer.  Durenda is under a lot of stress.  Vitamin D deficiency-use of vitamin D was recommended.  Osteoporosis screening-DEXA scan is pending at this time.  We will discuss that at the follow-up visit.  It would be good to have a stronger bones prior to knee replacement.  Orders: No orders of the defined types were placed in this  encounter.  No orders of the defined types were placed in this encounter.   Follow-Up Instructions: Return in about 6 months (around 07/29/2021) for Osteoarthritis.   Bo Merino, MD  Note - This record has been created using Editor, commissioning.  Chart creation errors have been sought, but may not always  have been located. Such creation errors do not reflect on  the standard of medical care.

## 2021-01-27 ENCOUNTER — Encounter: Payer: Self-pay | Admitting: Rheumatology

## 2021-01-27 ENCOUNTER — Other Ambulatory Visit: Payer: Self-pay

## 2021-01-27 ENCOUNTER — Ambulatory Visit: Payer: Medicare HMO | Admitting: Rheumatology

## 2021-01-27 VITALS — BP 110/76 | HR 90 | Resp 15 | Ht 63.0 in | Wt 176.0 lb

## 2021-01-27 DIAGNOSIS — M170 Bilateral primary osteoarthritis of knee: Secondary | ICD-10-CM

## 2021-01-27 DIAGNOSIS — M25462 Effusion, left knee: Secondary | ICD-10-CM | POA: Diagnosis not present

## 2021-01-27 DIAGNOSIS — E559 Vitamin D deficiency, unspecified: Secondary | ICD-10-CM

## 2021-01-27 DIAGNOSIS — Z860100 Personal history of colon polyps, unspecified: Secondary | ICD-10-CM

## 2021-01-27 DIAGNOSIS — Z9886 Personal history of breast implant removal: Secondary | ICD-10-CM

## 2021-01-27 DIAGNOSIS — G8929 Other chronic pain: Secondary | ICD-10-CM

## 2021-01-27 DIAGNOSIS — E782 Mixed hyperlipidemia: Secondary | ICD-10-CM

## 2021-01-27 DIAGNOSIS — E1169 Type 2 diabetes mellitus with other specified complication: Secondary | ICD-10-CM

## 2021-01-27 DIAGNOSIS — Z789 Other specified health status: Secondary | ICD-10-CM

## 2021-01-27 DIAGNOSIS — Z13820 Encounter for screening for osteoporosis: Secondary | ICD-10-CM

## 2021-01-27 DIAGNOSIS — M79605 Pain in left leg: Secondary | ICD-10-CM

## 2021-01-27 DIAGNOSIS — M5136 Other intervertebral disc degeneration, lumbar region: Secondary | ICD-10-CM

## 2021-01-27 DIAGNOSIS — T466X5A Adverse effect of antihyperlipidemic and antiarteriosclerotic drugs, initial encounter: Secondary | ICD-10-CM

## 2021-01-27 DIAGNOSIS — M222X1 Patellofemoral disorders, right knee: Secondary | ICD-10-CM

## 2021-01-27 DIAGNOSIS — M222X2 Patellofemoral disorders, left knee: Secondary | ICD-10-CM

## 2021-01-27 DIAGNOSIS — M25562 Pain in left knee: Secondary | ICD-10-CM

## 2021-01-27 DIAGNOSIS — F329 Major depressive disorder, single episode, unspecified: Secondary | ICD-10-CM

## 2021-01-27 DIAGNOSIS — M25561 Pain in right knee: Secondary | ICD-10-CM | POA: Diagnosis not present

## 2021-01-27 DIAGNOSIS — M79604 Pain in right leg: Secondary | ICD-10-CM | POA: Diagnosis not present

## 2021-01-27 DIAGNOSIS — I10 Essential (primary) hypertension: Secondary | ICD-10-CM

## 2021-01-27 DIAGNOSIS — M791 Myalgia, unspecified site: Secondary | ICD-10-CM

## 2021-01-27 DIAGNOSIS — E119 Type 2 diabetes mellitus without complications: Secondary | ICD-10-CM

## 2021-01-27 DIAGNOSIS — M51369 Other intervertebral disc degeneration, lumbar region without mention of lumbar back pain or lower extremity pain: Secondary | ICD-10-CM

## 2021-01-27 NOTE — Patient Instructions (Signed)
Journal for Nurse Practitioners, 15(4), 263-267. Retrieved July 30, 2018 from http://clinicalkey.com/nursing">  Knee Exercises Ask your health care provider which exercises are safe for you. Do exercises exactly as told by your health care provider and adjust them as directed. It is normal to feel mild stretching, pulling, tightness, or discomfort as you do these exercises. Stop right away if you feel sudden pain or your pain gets worse. Do not begin these exercises until told by your health care provider. Stretching and range-of-motion exercises These exercises warm up your muscles and joints and improve the movement and flexibility of your knee. These exercises also help to relieve pain and swelling. Knee extension, prone 1. Lie on your abdomen (prone position) on a bed. 2. Place your left / right knee just beyond the edge of the surface so your knee is not on the bed. You can put a towel under your left / right thigh just above your kneecap for comfort. 3. Relax your leg muscles and allow gravity to straighten your knee (extension). You should feel a stretch behind your left / right knee. 4. Hold this position for __________ seconds. 5. Scoot up so your knee is supported between repetitions. Repeat __________ times. Complete this exercise __________ times a day. Knee flexion, active 1. Lie on your back with both legs straight. If this causes back discomfort, bend your left / right knee so your foot is flat on the floor. 2. Slowly slide your left / right heel back toward your buttocks. Stop when you feel a gentle stretch in the front of your knee or thigh (flexion). 3. Hold this position for __________ seconds. 4. Slowly slide your left / right heel back to the starting position. Repeat __________ times. Complete this exercise __________ times a day.   Quadriceps stretch, prone 1. Lie on your abdomen on a firm surface, such as a bed or padded floor. 2. Bend your left / right knee and hold  your ankle. If you cannot reach your ankle or pant leg, loop a belt around your foot and grab the belt instead. 3. Gently pull your heel toward your buttocks. Your knee should not slide out to the side. You should feel a stretch in the front of your thigh and knee (quadriceps). 4. Hold this position for __________ seconds. Repeat __________ times. Complete this exercise __________ times a day.   Hamstring, supine 1. Lie on your back (supine position). 2. Loop a belt or towel over the ball of your left / right foot. The ball of your foot is on the walking surface, right under your toes. 3. Straighten your left / right knee and slowly pull on the belt to raise your leg until you feel a gentle stretch behind your knee (hamstring). ? Do not let your knee bend while you do this. ? Keep your other leg flat on the floor. 4. Hold this position for __________ seconds. Repeat __________ times. Complete this exercise __________ times a day. Strengthening exercises These exercises build strength and endurance in your knee. Endurance is the ability to use your muscles for a long time, even after they get tired. Quadriceps, isometric This exercise stretches the muscles in front of your thigh (quadriceps) without moving your knee joint (isometric). 1. Lie on your back with your left / right leg extended and your other knee bent. Put a rolled towel or small pillow under your knee if told by your health care provider. 2. Slowly tense the muscles in the front of your   left / right thigh. You should see your kneecap slide up toward your hip or see increased dimpling just above the knee. This motion will push the back of the knee toward the floor. 3. For __________ seconds, hold the muscle as tight as you can without increasing your pain. 4. Relax the muscles slowly and completely. Repeat __________ times. Complete this exercise __________ times a day.   Straight leg raises This exercise stretches the muscles in  front of your thigh (quadriceps) and the muscles that move your hips (hip flexors). 1. Lie on your back with your left / right leg extended and your other knee bent. 2. Tense the muscles in the front of your left / right thigh. You should see your kneecap slide up or see increased dimpling just above the knee. Your thigh may even shake a bit. 3. Keep these muscles tight as you raise your leg 4-6 inches (10-15 cm) off the floor. Do not let your knee bend. 4. Hold this position for __________ seconds. 5. Keep these muscles tense as you lower your leg. 6. Relax your muscles slowly and completely after each repetition. Repeat __________ times. Complete this exercise __________ times a day. Hamstring, isometric 1. Lie on your back on a firm surface. 2. Bend your left / right knee about __________ degrees. 3. Dig your left / right heel into the surface as if you are trying to pull it toward your buttocks. Tighten the muscles in the back of your thighs (hamstring) to "dig" as hard as you can without increasing any pain. 4. Hold this position for __________ seconds. 5. Release the tension gradually and allow your muscles to relax completely for __________ seconds after each repetition. Repeat __________ times. Complete this exercise __________ times a day. Hamstring curls If told by your health care provider, do this exercise while wearing ankle weights. Begin with __________ lb weights. Then increase the weight by 1 lb (0.5 kg) increments. Do not wear ankle weights that are more than __________ lb. 1. Lie on your abdomen with your legs straight. 2. Bend your left / right knee as far as you can without feeling pain. Keep your hips flat against the floor. 3. Hold this position for __________ seconds. 4. Slowly lower your leg to the starting position. Repeat __________ times. Complete this exercise __________ times a day.   Squats This exercise strengthens the muscles in front of your thigh and knee  (quadriceps). 1. Stand in front of a table, with your feet and knees pointing straight ahead. You may rest your hands on the table for balance but not for support. 2. Slowly bend your knees and lower your hips like you are going to sit in a chair. ? Keep your weight over your heels, not over your toes. ? Keep your lower legs upright so they are parallel with the table legs. ? Do not let your hips go lower than your knees. ? Do not bend lower than told by your health care provider. ? If your knee pain increases, do not bend as low. 3. Hold the squat position for __________ seconds. 4. Slowly push with your legs to return to standing. Do not use your hands to pull yourself to standing. Repeat __________ times. Complete this exercise __________ times a day. Wall slides This exercise strengthens the muscles in front of your thigh and knee (quadriceps). 1. Lean your back against a smooth wall or door, and walk your feet out 18-24 inches (46-61 cm) from it. 2.   Place your feet hip-width apart. 3. Slowly slide down the wall or door until your knees bend __________ degrees. Keep your knees over your heels, not over your toes. Keep your knees in line with your hips. 4. Hold this position for __________ seconds. Repeat __________ times. Complete this exercise __________ times a day.   Straight leg raises This exercise strengthens the muscles that rotate the leg at the hip and move it away from your body (hip abductors). 1. Lie on your side with your left / right leg in the top position. Lie so your head, shoulder, knee, and hip line up. You may bend your bottom knee to help you keep your balance. 2. Roll your hips slightly forward so your hips are stacked directly over each other and your left / right knee is facing forward. 3. Leading with your heel, lift your top leg 4-6 inches (10-15 cm). You should feel the muscles in your outer hip lifting. ? Do not let your foot drift forward. ? Do not let your  knee roll toward the ceiling. 4. Hold this position for __________ seconds. 5. Slowly return your leg to the starting position. 6. Let your muscles relax completely after each repetition. Repeat __________ times. Complete this exercise __________ times a day.   Straight leg raises This exercise stretches the muscles that move your hips away from the front of the pelvis (hip extensors). 1. Lie on your abdomen on a firm surface. You can put a pillow under your hips if that is more comfortable. 2. Tense the muscles in your buttocks and lift your left / right leg about 4-6 inches (10-15 cm). Keep your knee straight as you lift your leg. 3. Hold this position for __________ seconds. 4. Slowly lower your leg to the starting position. 5. Let your leg relax completely after each repetition. Repeat __________ times. Complete this exercise __________ times a day. This information is not intended to replace advice given to you by your health care provider. Make sure you discuss any questions you have with your health care provider. Document Revised: 07/31/2018 Document Reviewed: 07/31/2018 Elsevier Patient Education  [employer].   Heart Disease Prevention   Your inflammatory disease increases your risk of heart disease which includes heart attack, stroke, atrial fibrillation (irregular heartbeats), high blood pressure, heart failure and atherosclerosis (plaque in the arteries).  It is important to reduce your risk by:   . Keep blood pressure, cholesterol, and blood sugar at healthy levels   . Smoking Cessation   . Maintain a healthy weight  o BMI 20-25   . Eat a healthy diet  o Plenty of fresh fruit, vegetables, and whole grains  o Limit saturated fats, foods high in sodium, and added sugars  o DASH and Mediterranean diet   . Increase physical activity  o Recommend moderate physically activity for 150 minutes per week/ 30 minutes a day for five days a week These can be broken up into  three separate ten-minute sessions during the day.   . Reduce Stress  . Meditation, slow breathing exercises, yoga, coloring books  . Dental visits twice a year

## 2021-01-27 NOTE — Progress Notes (Signed)
This encounter was created in error - please disregard.

## 2021-02-09 ENCOUNTER — Ambulatory Visit: Payer: Medicare HMO | Admitting: Orthopaedic Surgery

## 2021-02-09 VITALS — Ht 61.75 in | Wt 176.0 lb

## 2021-02-09 DIAGNOSIS — M1711 Unilateral primary osteoarthritis, right knee: Secondary | ICD-10-CM | POA: Insufficient documentation

## 2021-02-09 DIAGNOSIS — M170 Bilateral primary osteoarthritis of knee: Secondary | ICD-10-CM

## 2021-02-09 DIAGNOSIS — M1712 Unilateral primary osteoarthritis, left knee: Secondary | ICD-10-CM | POA: Diagnosis not present

## 2021-02-09 NOTE — Progress Notes (Signed)
Office Visit Note   Patient: Sarah Walsh           Date of Birth: 1956/05/04           MRN: [id number] Visit Date: 02/09/2021              Requested by: Bo Merino, MD [address] [place],  [place] [address] PCP: Leamon Arnt, MD   Assessment & Plan: Visit Diagnoses:  1. Primary osteoarthritis of left knee   2. Primary osteoarthritis of right knee     Plan: I evaluated the x-rays and Sheria unfortunately does have end-stage degenerative joint disease to both knees without significant relief from conservative treatments.  Given the fact that she has constant pain with daily activities and inability to sleep at night she has elected to proceed with a left total knee replacement sometime in the future.  She has minimal social support and may need a short-term SNF stay postoperatively.  We would likely begin with the left knee replacement first.  She is a well-controlled diabetic.  Our nurse care manager Burt Knack was able to sit down with her and go through postoperative protocol.  Total face to face encounter time was greater than 45 minutes and over half of this time was spent in counseling and/or coordination of care.  Follow-Up Instructions: Return if symptoms worsen or fail to improve.   Orders:  No orders of the defined types were placed in this encounter.  No orders of the defined types were placed in this encounter.     Procedures: No procedures performed   Clinical Data: No additional findings.   Subjective: Chief Complaint  Patient presents with  . Left Knee - Pain    Sarah Walsh is a very pleasant 65 year old female referral from Dr. Bobbye Morton for end-stage degenerative disease of both of her knees worse on the left.  She has had prior cortisone injections and activity modifications without significant relief.  She has chronic severe pain with all daily activities.  She has constant weakness and giving way and sensation of falling as  well as popping in her knees and difficulty with using steps.  Tylenol ibuprofen provided no relief.  Cortisone injections only provide temporary relief.   Review of Systems  Constitutional: Negative.   HENT: Negative.   Eyes: Negative.   Respiratory: Negative.   Cardiovascular: Negative.   Endocrine: Negative.   Musculoskeletal: Negative.   Neurological: Negative.   Hematological: Negative.   Psychiatric/Behavioral: Negative.   All other systems reviewed and are negative.    Objective: Vital Signs: Ht 5' 1.75" (1.568 m)   Wt 176 lb (79.8 kg)   BMI 32.45 kg/m   Physical Exam Vitals and nursing note reviewed.  Constitutional:      Appearance: She is well-developed.  HENT:     Head: Normocephalic and atraumatic.  Pulmonary:     Effort: Pulmonary effort is normal.  Abdominal:     Palpations: Abdomen is soft.  Musculoskeletal:     Cervical back: Neck supple.  Skin:    General: Skin is warm.     Capillary Refill: Capillary refill takes less than 2 seconds.  Neurological:     Mental Status: She is alert and oriented to person, place, and time.  Psychiatric:        Behavior: Behavior normal.        Thought Content: Thought content normal.        Judgment: Judgment normal.  Ortho Exam Bilateral knees showed no joint effusion.  Moderate patellofemoral crepitus and pain with range of motion which is moderately limited.  Collaterals and cruciates are stable.  Slight joint line tenderness. Specialty Comments:  No specialty comments available.  Imaging: No results found.   PMFS History: Patient Active Problem List   Diagnosis Date Noted  . Primary osteoarthritis of left knee 02/09/2021  . Primary osteoarthritis of right knee 02/09/2021  . Breast implant rupture 10/13/2020  . Osteoarthritis of spine with radiculopathy, lumbar region 07/15/2020  . Myalgia due to statin 02/24/2020  . Reactive depression 11/06/2019  . Colon polyp 05/29/2019  . Controlled type 2  diabetes mellitus without complication, without long-term current use of insulin ([facility]) 03/22/2019  . Patellofemoral pain syndrome of both knees 07/04/2018  . Lower extremity edema 03/03/2017  . Vitamin D deficiency 11/23/2015  . Combined hyperlipidemia associated with type 2 diabetes mellitus ([facility]) 10/22/2015  . Primary osteoarthritis of both knees 10/22/2015  . Statin intolerance 10/22/2015  . Essential hypertension 11/05/2008   Past Medical History:  Diagnosis Date  . Arthritis    bilateral knees  . Colon polyp 05/29/2019  . Depression    on meds-situational anxiety  . Diabetes mellitus without complication ([facility])    on meds  . Hyperlipemia    on meds  . Hypertension    hx of     Family History  Problem Relation Age of Onset  . Alzheimer's disease Mother   . Diabetes Daughter   . Healthy Daughter   . Diabetes Father   . Colon polyps Father   . Healthy Daughter   . Healthy Daughter   . Healthy Son   . Esophageal cancer Neg Hx   . Rectal cancer Neg Hx   . Stomach cancer Neg Hx   . Colon cancer Neg Hx     Past Surgical History:  Procedure Laterality Date  . ABDOMINAL HYSTERECTOMY    . AUGMENTATION MAMMAPLASTY Bilateral    36 years   . BREAST IMPLANT REMOVAL Bilateral 12/10/2020   Procedure: REMOVAL BREAST IMPLANTS;  Surgeon: Wallace Going, DO;  Location: [facility];  Service: Plastics;  Laterality: Bilateral;  2 hours please  . CAPSULECTOMY Bilateral 12/10/2020   Procedure: CAPSULECTOMY;  Surgeon: Wallace Going, DO;  Location: [facility];  Service: Plastics;  Laterality: Bilateral;  . CESAREAN SECTION     4 times  . COLONOSCOPY  2020   Gupta-MAC-tics/hems/multiple polyps  . POLYPECTOMY  2020   multiple polyps  . WISDOM TOOTH EXTRACTION     Social History   Occupational History  . Not on file  Tobacco Use  . Smoking status: Former Smoker    Years: 34.00    Quit date: 1985    Years since quitting: 37.3  . Smokeless  tobacco: Never Used  Vaping Use  . Vaping Use: Never used  Substance and Sexual Activity  . Alcohol use: No  . Drug use: No  . Sexual activity: Not on file

## 2021-02-11 ENCOUNTER — Other Ambulatory Visit: Payer: Self-pay

## 2021-02-11 ENCOUNTER — Ambulatory Visit (AMBULATORY_SURGERY_CENTER): Payer: Self-pay | Admitting: *Deleted

## 2021-02-11 VITALS — Ht 63.0 in | Wt 175.0 lb

## 2021-02-11 DIAGNOSIS — Z86010 Personal history of colonic polyps: Secondary | ICD-10-CM

## 2021-02-11 MED ORDER — ONDANSETRON HCL 4 MG PO TABS: 4.0000 mg | ORAL_TABLET | ORAL | 0 refills | Status: DC

## 2021-02-11 MED ORDER — NA SULFATE-K SULFATE-MG SULF 17.5-3.13-1.6 GM/177ML PO SOLN: ORAL | 0 refills | Status: DC

## 2021-02-11 NOTE — Progress Notes (Signed)
Patient is here in-person for PV. Patient denies any allergies to eggs or soy. Patient denies any problems with anesthesia/sedation. Patient denies any oxygen use at home. Patient denies taking any diet/weight loss medications or blood thinners. Patient is not being treated for MRSA or C-diff. Patient is aware of our care-partner policy and [id number] safety protocol. Patient is fully COVID-19 vaccinated, per patient.   2day prep per MD, Zofran given per nausea

## 2021-02-25 ENCOUNTER — Ambulatory Visit (AMBULATORY_SURGERY_CENTER): Payer: Medicare HMO | Admitting: Gastroenterology

## 2021-02-25 ENCOUNTER — Encounter: Payer: Self-pay | Admitting: Gastroenterology

## 2021-02-25 ENCOUNTER — Other Ambulatory Visit: Payer: Self-pay

## 2021-02-25 VITALS — BP 129/80 | HR 80 | Temp 97.5°F | Resp 10 | Ht 63.0 in | Wt 175.0 lb

## 2021-02-25 DIAGNOSIS — Z860100 Personal history of colon polyps, unspecified: Secondary | ICD-10-CM

## 2021-02-25 DIAGNOSIS — K635 Polyp of colon: Secondary | ICD-10-CM | POA: Diagnosis not present

## 2021-02-25 DIAGNOSIS — D125 Benign neoplasm of sigmoid colon: Secondary | ICD-10-CM

## 2021-02-25 MED ORDER — SODIUM CHLORIDE 0.9 % IV SOLN: 500.0000 mL | Freq: Once | INTRAVENOUS | Status: DC

## 2021-02-25 NOTE — Progress Notes (Signed)
Called to room to assist during endoscopic procedure.  Patient ID and intended procedure confirmed with present staff. Received instructions for my participation in the procedure from the performing physician.  

## 2021-02-25 NOTE — Progress Notes (Signed)
1000 Patient experiencing nausea.  MD requesting Zofran 4 mg IV, med given, vss

## 2021-02-25 NOTE — Op Note (Signed)
[facility] [name] Procedure Date: 02/25/2021 9:50 AM MRN: [id number] Endoscopist: Jackquline Denmark , MD Age: 65 Referring MD:  Date of Birth: 11-27-1955 Gender: Female Account #: [id number] Procedure:                Colonoscopy Indications:              High risk colon cancer surveillance: Personal                            history of colonic polyps Medicines:                Monitored Anesthesia Care Procedure:                Pre-Anesthesia Assessment:                           - Prior to the procedure, a History and Physical                            was performed, and patient medications and                            allergies were reviewed. The patient's tolerance of                            previous anesthesia was also reviewed. The risks                            and benefits of the procedure and the sedation                            options and risks were discussed with the patient.                            All questions were answered, and informed consent                            was obtained. Prior Anticoagulants: The patient has                            taken no previous anticoagulant or antiplatelet                            agents. ASA Grade Assessment: II - A patient with                            mild systemic disease. After reviewing the risks                            and benefits, the patient was deemed in                            satisfactory condition to undergo the procedure.  After obtaining informed consent, the colonoscope                            was passed under direct vision. Throughout the                            procedure, the patient's blood pressure, pulse, and                            oxygen saturations were monitored continuously. The                            Olympus PCF-H190DL ([id number]) Colonoscope was                            introduced through the anus and advanced to  the 2                            cm into the ileum. The colonoscopy was performed                            without difficulty. The patient tolerated the                            procedure well. The quality of the bowel                            preparation was good. The terminal ileum, ileocecal                            valve, appendiceal orifice, and rectum were                            photographed. Scope In: 10:05:37 AM Scope Out: 10:18:00 AM Scope Withdrawal Time: 0 hours 8 minutes 4 seconds  Total Procedure Duration: 0 hours 12 minutes 23 seconds  Findings:                 A 6 mm polyp was found in the mid sigmoid colon.                            The polyp was sessile. The polyp was removed with a                            cold snare. Resection and retrieval were complete.                           A few small-mouthed diverticula were found in the                            sigmoid colon.                           Non-bleeding internal hemorrhoids were found during  retroflexion. The hemorrhoids were moderate.                           The terminal ileum appeared normal.                           The exam was otherwise without abnormality on                            direct and retroflexion views. Complications:            No immediate complications. Estimated Blood Loss:     Estimated blood loss: none. Impression:               - One 6 mm polyp in the mid sigmoid colon, removed                            with a cold snare. Resected and retrieved.                           - Minimal sigmoid diverticulosis                           - Non-bleeding internal hemorrhoids.                           - The examined portion of the ileum was normal.                           - The examination was otherwise normal on direct                            and retroflexion views. Recommendation:           - Patient has a contact number available for                             emergencies. The signs and symptoms of potential                            delayed complications were discussed with the                            patient. Return to normal activities tomorrow.                            Written discharge instructions were provided to the                            patient.                           - Resume previous diet.                           - Continue present medications.                           -  Await pathology results.                           - Repeat colonoscopy for surveillance based on                            pathology results.                           - Return to GI clinic PRN.                           - The findings and recommendations were discussed                            with the patient's family. Jackquline Denmark, MD 02/25/2021 10:22:29 AM This report has been signed electronically.

## 2021-02-25 NOTE — Progress Notes (Signed)
Report given to PACU, vss 

## 2021-02-25 NOTE — Progress Notes (Signed)
Medical history reviewed with no changes noted since PV. VS assessed by C.W 

## 2021-02-25 NOTE — Patient Instructions (Signed)
Handouts given:  Polyps, Hemorrhoids, Diverticulosis Resume previous diet Continue current medications Await pathology results  YOU HAD AN ENDOSCOPIC PROCEDURE TODAY AT [facility]:   Refer to the procedure report that was given to you for any specific questions about what was found during the examination.  If the procedure report does not answer your questions, please call your gastroenterologist to clarify.  If you requested that your care partner not be given the details of your procedure findings, then the procedure report has been included in a sealed envelope for you to review at your convenience later.  YOU SHOULD EXPECT: Some feelings of bloating in the abdomen. Passage of more gas than usual.  Walking can help get rid of the air that was put into your GI tract during the procedure and reduce the bloating. If you had a lower endoscopy (such as a colonoscopy or flexible sigmoidoscopy) you may notice spotting of blood in your stool or on the toilet paper. If you underwent a bowel prep for your procedure, you may not have a normal bowel movement for a few days.  Please Note:  You might notice some irritation and congestion in your nose or some drainage.  This is from the oxygen used during your procedure.  There is no need for concern and it should clear up in a day or so.  SYMPTOMS TO REPORT IMMEDIATELY:   Following lower endoscopy (colonoscopy or flexible sigmoidoscopy):  Excessive amounts of blood in the stool  Significant tenderness or worsening of abdominal pains  Swelling of the abdomen that is new, acute  Fever of 100F or higher  For urgent or emergent issues, a gastroenterologist can be reached at any hour by calling [phone]. Do not use MyChart messaging for urgent concerns.   DIET:  We do recommend a small meal at first, but then you may proceed to your regular diet.  Drink plenty of fluids but you should avoid alcoholic beverages for 24  hours.  ACTIVITY:  You should plan to take it easy for the rest of today and you should NOT DRIVE or use heavy machinery until tomorrow (because of the sedation medicines used during the test).    FOLLOW UP: Our staff will call the number listed on your records 48-72 hours following your procedure to check on you and address any questions or concerns that you may have regarding the information given to you following your procedure. If we do not reach you, we will leave a message.  We will attempt to reach you two times.  During this call, we will ask if you have developed any symptoms of COVID 19. If you develop any symptoms (ie: fever, flu-like symptoms, shortness of breath, cough etc.) before then, please call [phone].  If you test positive for Covid 19 in the 2 weeks post procedure, please call and report this information to [place].    If any biopsies were taken you will be contacted by phone or by letter within the next 1-3 weeks.  Please call us at [phone] if you have not heard about the biopsies in 3 weeks.   SIGNATURES/CONFIDENTIALITY: You and/or your care partner have signed paperwork which will be entered into your electronic medical record.  These signatures attest to the fact that that the information above on your After Visit Summary has been reviewed and is understood.  Full responsibility of the confidentiality of this discharge information lies with you and/or your care-partner.

## 2021-03-01 ENCOUNTER — Telehealth: Payer: Self-pay | Admitting: *Deleted

## 2021-03-01 ENCOUNTER — Telehealth: Payer: Self-pay

## 2021-03-01 NOTE — Telephone Encounter (Signed)
Mailbox full

## 2021-03-01 NOTE — Telephone Encounter (Signed)
Attempted f/u call. VM full unable to leave message.

## 2021-03-11 ENCOUNTER — Encounter: Payer: Self-pay | Admitting: Gastroenterology

## 2021-04-02 ENCOUNTER — Telehealth: Payer: Self-pay | Admitting: Orthopaedic Surgery

## 2021-04-02 NOTE — Telephone Encounter (Signed)
Patient called stating she would like to go ahead and schedule her left total knee arthroplasty with Dr Erlinda Hong.  She has asked if we'll hold her the date of July llth.  Please provide surgery sheet and advise if patient will need any type of clearance prior to this procedure. Thanks.

## 2021-04-05 ENCOUNTER — Other Ambulatory Visit: Payer: Self-pay

## 2021-04-09 ENCOUNTER — Other Ambulatory Visit: Payer: Self-pay | Admitting: Family Medicine

## 2021-04-12 ENCOUNTER — Telehealth: Payer: Self-pay | Admitting: Orthopaedic Surgery

## 2021-04-12 ENCOUNTER — Other Ambulatory Visit: Payer: Medicare HMO

## 2021-04-12 NOTE — Telephone Encounter (Signed)
Patient has a nurse only visit scheduled with Dr. Phoebe Sharps office on 04-22-21 at 10:45am. We will be checking patient's A1c to make sure she is optimized for her Left Total Knee Arthroplasty scheduled at [facility] on July 11th.

## 2021-04-20 NOTE — Pre-Procedure Instructions (Signed)
Surgical Instructions    Your procedure is scheduled on Monday, July 11th.  Report to [facility] Main Entrance "A" at 10:45 A.M., then check in with the Admitting office.  Call this number if you have problems the morning of surgery:  [phone]   If you have any questions prior to your surgery date call [phone]: Open Monday-Friday 8am-4pm    Remember:  Do not eat after midnight the night before your surgery  You may drink clear liquids until 9:45 a.m. the morning of your surgery.   Clear liquids allowed are: Water, Non-Citrus Juices (without pulp), Carbonated Beverages, Clear Tea, Black Coffee Only, and Gatorade. (Please chose sugar free or diet options)   Enhanced Recovery after Surgery for Orthopedics Enhanced Recovery after Surgery is a protocol used to improve the stress on your body and your recovery after surgery.  Patient Instructions  The day of surgery (if you have diabetes):  Drink ONE 12 oz bottle of G2 Gatorade by 9:45 am the morning of surgery This bottle was given to you during your hospital  pre-op appointment visit.  Nothing else to drink after completing the  Small 10 oz bottle of Gatorade.         If you have questions, please contact your surgeon's office.     Take these medicines the morning of surgery with A SIP OF WATER   ezetimibe (ZETIA) FLUoxetine (PROZAC)   As of today, STOP taking any Aspirin (unless otherwise instructed by your surgeon) Aleve, Naproxen, Ibuprofen, Motrin, Advil, Goody's, BC's, all herbal medications, fish oil, and all vitamins. This includes: diclofenac (VOLTAREN).  WHAT DO I DO ABOUT MY DIABETES MEDICATION?   Do not take metFORMIN (GLUCOPHAGE) the morning of surgery.   HOW TO MANAGE YOUR DIABETES BEFORE AND AFTER SURGERY  Why is it important to control my blood sugar before and after surgery? Improving blood sugar levels before and after surgery helps healing and can limit problems. A way of improving blood  sugar control is eating a healthy diet by:  Eating less sugar and carbohydrates  Increasing activity/exercise  Talking with your doctor about reaching your blood sugar goals High blood sugars (greater than 180 mg/dL) can raise your risk of infections and slow your recovery, so you will need to focus on controlling your diabetes during the weeks before surgery. Make sure that the doctor who takes care of your diabetes knows about your planned surgery including the date and location.  How do I manage my blood sugar before surgery? Check your blood sugar at least 4 times a day, starting 2 days before surgery, to make sure that the level is not too high or low.  Check your blood sugar the morning of your surgery when you wake up and every 2 hours until you get to the Short Stay unit.  If your blood sugar is less than 70 mg/dL, you will need to treat for low blood sugar: Do not take insulin. Treat a low blood sugar (less than 70 mg/dL) with  cup of clear juice (cranberry or apple), 4 glucose tablets, OR glucose gel. Recheck blood sugar in 15 minutes after treatment (to make sure it is greater than 70 mg/dL). If your blood sugar is not greater than 70 mg/dL on recheck, call [phone] for further instructions. Report your blood sugar to the short stay nurse when you get to Short Stay.  If you are admitted to the hospital after surgery: Your blood sugar will be checked by the staff and  you will probably be given insulin after surgery (instead of oral diabetes medicines) to make sure you have good blood sugar levels. The goal for blood sugar control after surgery is 80-180 mg/dL.                      Do NOT Smoke (Tobacco/Vaping) or drink Alcohol 24 hours prior to your procedure.  If you use a CPAP at night, you may bring all equipment for your overnight stay.   Contacts, glasses, piercing's, hearing aid's, dentures or partials may not be worn into surgery, please bring cases for these  belongings.    For patients admitted to the hospital, discharge time will be determined by your treatment team.   Patients discharged the day of surgery will not be allowed to drive home, and someone needs to stay with them for 24 hours.  ONLY 1 SUPPORT PERSON MAY BE PRESENT WHILE YOU ARE IN SURGERY. IF YOU ARE TO BE ADMITTED ONCE YOU ARE IN YOUR ROOM YOU WILL BE ALLOWED TWO (2) VISITORS.  Minor children may have two parents present. Special consideration for safety and communication needs will be reviewed on a case by case basis.   Special instructions:   [place]- Preparing For Surgery  Before surgery, you can play an important role. Because skin is not sterile, your skin needs to be as free of germs as possible. You can reduce the number of germs on your skin by washing with CHG (chlorahexidine gluconate) Soap before surgery.  CHG is an antiseptic cleaner which kills germs and bonds with the skin to continue killing germs even after washing.    Oral Hygiene is also important to reduce your risk of infection.  Remember - BRUSH YOUR TEETH THE MORNING OF SURGERY WITH YOUR REGULAR TOOTHPASTE  Please do not use if you have an allergy to CHG or antibacterial soaps. If your skin becomes reddened/irritated stop using the CHG.  Do not shave (including legs and underarms) for at least 48 hours prior to first CHG shower. It is OK to shave your face.  Please follow these instructions carefully.   Shower the NIGHT BEFORE SURGERY and the MORNING OF SURGERY  If you chose to wash your hair, wash your hair first as usual with your normal shampoo.  After you shampoo, rinse your hair and body thoroughly to remove the shampoo.  Use CHG Soap as you would any other liquid soap. You can apply CHG directly to the skin and wash gently with a scrungie or a clean washcloth.   Apply the CHG Soap to your body ONLY FROM THE NECK DOWN.  Do not use on open wounds or open sores. Avoid contact with your eyes, ears,  mouth and genitals (private parts). Wash Face and genitals (private parts)  with your normal soap.   Wash thoroughly, paying special attention to the area where your surgery will be performed.  Thoroughly rinse your body with warm water from the neck down.  DO NOT shower/wash with your normal soap after using and rinsing off the CHG Soap.  Pat yourself dry with a CLEAN TOWEL.  Wear CLEAN PAJAMAS to bed the night before surgery  Place CLEAN SHEETS on your bed the night before your surgery  DO NOT SLEEP WITH PETS.   Day of Surgery: Shower with CHG soap. Do not wear jewelry, make up, nail polish, gel polish, artificial nails, or any other type of covering on natural nails including finger and toenails. If  patients have artificial nails, gel coating, etc. that need to be removed by a nail salon please have this removed prior to surgery. Surgery may need to be canceled/delayed if the surgeon/ anesthesia feels like the patient is unable to be adequately monitored. Do not wear lotions, powders, perfumes, or deodorant. Do not shave 48 hours prior to surgery.   Do not bring valuables to the hospital. [facility] is not responsible for any belongings or valuables. Wear Clean/Comfortable clothing the morning of surgery Remember to brush your teeth WITH YOUR REGULAR TOOTHPASTE.   Please read over the following fact sheets that you were given.

## 2021-04-21 ENCOUNTER — Ambulatory Visit
Admission: RE | Admit: 2021-04-21 | Discharge: 2021-04-21 | Disposition: A | Discharge status: Home / Self Care | Payer: Medicare HMO | Source: Ambulatory Visit | Attending: Family Medicine | Admitting: Family Medicine

## 2021-04-21 ENCOUNTER — Ambulatory Visit (HOSPITAL_COMMUNITY)
Admission: RE | Admit: 2021-04-21 | Discharge: 2021-04-21 | Disposition: A | Discharge status: Home / Self Care | Payer: Medicare HMO | Source: Ambulatory Visit | Attending: Physician Assistant | Admitting: Physician Assistant

## 2021-04-21 ENCOUNTER — Encounter (HOSPITAL_COMMUNITY): Payer: Self-pay

## 2021-04-21 ENCOUNTER — Other Ambulatory Visit: Payer: Self-pay

## 2021-04-21 ENCOUNTER — Encounter (HOSPITAL_COMMUNITY)
Admission: RE | Admit: 2021-04-21 | Discharge: 2021-04-21 | Disposition: A | Discharge status: Home / Self Care | Payer: Medicare HMO | Source: Ambulatory Visit | Attending: Orthopaedic Surgery | Admitting: Orthopaedic Surgery

## 2021-04-21 DIAGNOSIS — M1712 Unilateral primary osteoarthritis, left knee: Secondary | ICD-10-CM | POA: Diagnosis not present

## 2021-04-21 DIAGNOSIS — E2839 Other primary ovarian failure: Secondary | ICD-10-CM

## 2021-04-21 DIAGNOSIS — Z01818 Encounter for other preprocedural examination: Secondary | ICD-10-CM | POA: Diagnosis present

## 2021-04-21 HISTORY — DX: Anemia, unspecified: D64.9

## 2021-04-21 LAB — CBC WITH DIFFERENTIAL/PLATELET
Abs Immature Granulocytes: 0.04 10*3/uL (ref 0.00–0.07)
Basophils Absolute: 0.1 10*3/uL (ref 0.0–0.1)
Basophils Relative: 1 %
Eosinophils Absolute: 1.5 10*3/uL — ABNORMAL HIGH (ref 0.0–0.5)
Eosinophils Relative: 15 %
HCT: 40.9 % (ref 36.0–46.0)
Hemoglobin: 13.1 g/dL (ref 12.0–15.0)
Immature Granulocytes: 0 %
Lymphocytes Relative: 29 %
Lymphs Abs: 3.1 10*3/uL (ref 0.7–4.0)
MCH: 30 pg (ref 26.0–34.0)
MCHC: 32 g/dL (ref 30.0–36.0)
MCV: 93.6 fL (ref 80.0–100.0)
Monocytes Absolute: 0.6 10*3/uL (ref 0.1–1.0)
Monocytes Relative: 6 %
Neutro Abs: 5.1 10*3/uL (ref 1.7–7.7)
Neutrophils Relative %: 49 %
Platelets: 315 10*3/uL (ref 150–400)
RBC: 4.37 MIL/uL (ref 3.87–5.11)
RDW: 13.1 % (ref 11.5–15.5)
WBC: 10.4 10*3/uL (ref 4.0–10.5)
nRBC: 0 % (ref 0.0–0.2)

## 2021-04-21 LAB — URINALYSIS, ROUTINE W REFLEX MICROSCOPIC
Bilirubin Urine: NEGATIVE
Glucose, UA: NEGATIVE mg/dL
Hgb urine dipstick: NEGATIVE
Ketones, ur: 5 mg/dL — AB
Leukocytes,Ua: NEGATIVE
Nitrite: NEGATIVE
Protein, ur: NEGATIVE mg/dL
Specific Gravity, Urine: 1.023 (ref 1.005–1.030)
pH: 5 (ref 5.0–8.0)

## 2021-04-21 LAB — PROTIME-INR
INR: 1 (ref 0.8–1.2)
Prothrombin Time: 12.7 seconds (ref 11.4–15.2)

## 2021-04-21 LAB — COMPREHENSIVE METABOLIC PANEL
ALT: 25 U/L (ref 0–44)
AST: 21 U/L (ref 15–41)
Albumin: 3.8 g/dL (ref 3.5–5.0)
Alkaline Phosphatase: 53 U/L (ref 38–126)
Anion gap: 10 (ref 5–15)
BUN: 10 mg/dL (ref 8–23)
CO2: 22 mmol/L (ref 22–32)
Calcium: 9.2 mg/dL (ref 8.9–10.3)
Chloride: 98 mmol/L (ref 98–111)
Creatinine, Ser: 0.69 mg/dL (ref 0.44–1.00)
GFR, Estimated: >60 mL/min (ref >60)
Glucose, Bld: 155 mg/dL — ABNORMAL HIGH (ref 70–99)
Potassium: 4.4 mmol/L (ref 3.5–5.1)
Sodium: 130 mmol/L — ABNORMAL LOW (ref 135–145)
Total Bilirubin: 0.3 mg/dL (ref 0.3–1.2)
Total Protein: 7.3 g/dL (ref 6.5–8.1)

## 2021-04-21 LAB — TYPE AND SCREEN
ABO/RH(D): A POS
Antibody Screen: NEGATIVE

## 2021-04-21 LAB — APTT: aPTT: 27 seconds (ref 24–36)

## 2021-04-21 LAB — GLUCOSE, CAPILLARY
Glucose-Capillary: 112 mg/dL — ABNORMAL HIGH (ref 70–99)
Glucose-Capillary: 126 mg/dL — ABNORMAL HIGH (ref 70–99)

## 2021-04-21 LAB — SURGICAL PCR SCREEN
MRSA, PCR: NEGATIVE
Staphylococcus aureus: POSITIVE — AB

## 2021-04-21 NOTE — Progress Notes (Addendum)
PCP - Dr. Sueanne Margarita Cardiologist - denies  PPM/ICD - n/a Device Orders - n/a Rep Notified - n/a  Chest x-ray - 04/21/21 EKG - 04/21/21 Stress Test - denies ECHO - denies Cardiac Cath - denies  Sleep Study - denies CPAP - denies  Fasting Blood Sugar - 112. Blood sugar reading was 30 when first checked. Patient alert and talking and states that it is incorrect. Patient states she had a bowl of strawberries and blueberries and cereal this am and states "I feel fine". Patient is speaking clearly and talking and laughing. Called AUlice Brilliant, PA to make her aware of initial CBG reading and gave patient 4 oz apple juice while talking to A. Zelenak, PA. Recheck blood sugar was 112. Blood sugar checked again prior to patient leaving and BS was 126. Patient denies any complaints prior to discharge.  CMP- Glucose-155  Checks Blood Sugar zero times a day. Patient states she does not check her blood sugar at home. She only gets her A1C and CBG checked at doctor's appointments.   Blood Thinner Instructions: n/a Aspirin Instructions: n/a  ERAS Protcol - Yes PRE-SURGERY Ensure or G2- G2  COVID TEST- Scheduled for 04/29/21. Patient is aware of date, time, and location.   Anesthesia review: Yes. CMP - Sodium 130  Patient denies shortness of breath, fever, cough and chest pain at PAT appointment   All instructions explained to the patient, with a verbal understanding of the material. Patient agrees to go over the instructions while at home for a better understanding. Patient also instructed to self quarantine after being tested for COVID-19. The opportunity to ask questions was provided.

## 2021-04-22 ENCOUNTER — Ambulatory Visit: Payer: Medicare HMO

## 2021-04-22 LAB — HEMOGLOBIN A1C
Hgb A1c MFr Bld: 6 % — ABNORMAL HIGH (ref 4.8–5.6)
Mean Plasma Glucose: 126 mg/dL

## 2021-04-23 ENCOUNTER — Encounter: Payer: Self-pay | Admitting: Family Medicine

## 2021-04-23 DIAGNOSIS — M8580 Other specified disorders of bone density and structure, unspecified site: Secondary | ICD-10-CM

## 2021-04-23 HISTORY — DX: Other specified disorders of bone density and structure, unspecified site: M85.80

## 2021-04-23 LAB — GLUCOSE, CAPILLARY: Glucose-Capillary: 30 mg/dL — CL (ref 70–99)

## 2021-04-28 ENCOUNTER — Other Ambulatory Visit: Payer: Self-pay | Admitting: Physician Assistant

## 2021-04-28 MED ORDER — SULFAMETHOXAZOLE-TRIMETHOPRIM 800-160 MG PO TABS: 1.0000 | ORAL_TABLET | Freq: Two times a day (BID) | ORAL | 0 refills | Status: DC

## 2021-04-28 MED ORDER — ASPIRIN EC 81 MG PO TBEC: 81.0000 mg | DELAYED_RELEASE_TABLET | Freq: Two times a day (BID) | ORAL | 0 refills | Status: DC

## 2021-04-28 MED ORDER — OXYCODONE-ACETAMINOPHEN 5-325 MG PO TABS: 1.0000 | ORAL_TABLET | Freq: Four times a day (QID) | ORAL | 0 refills | Status: DC | PRN

## 2021-04-28 MED ORDER — METHOCARBAMOL 500 MG PO TABS: 500.0000 mg | ORAL_TABLET | Freq: Two times a day (BID) | ORAL | 0 refills | Status: DC | PRN

## 2021-04-28 MED ORDER — ONDANSETRON HCL 4 MG PO TABS: 4.0000 mg | ORAL_TABLET | Freq: Three times a day (TID) | ORAL | 0 refills | Status: DC | PRN

## 2021-04-29 ENCOUNTER — Other Ambulatory Visit (HOSPITAL_COMMUNITY): Payer: Medicare HMO

## 2021-04-30 ENCOUNTER — Other Ambulatory Visit (HOSPITAL_COMMUNITY)
Admission: RE | Admit: 2021-04-30 | Discharge: 2021-04-30 | Disposition: A | Discharge status: Home / Self Care | Payer: Medicare HMO | Source: Ambulatory Visit | Attending: Orthopaedic Surgery | Admitting: Orthopaedic Surgery

## 2021-04-30 DIAGNOSIS — Z01812 Encounter for preprocedural laboratory examination: Secondary | ICD-10-CM | POA: Insufficient documentation

## 2021-04-30 DIAGNOSIS — Z20822 Contact with and (suspected) exposure to covid-19: Secondary | ICD-10-CM | POA: Diagnosis not present

## 2021-04-30 LAB — SARS CORONAVIRUS 2 (TAT 6-24 HRS): SARS Coronavirus 2: NEGATIVE

## 2021-05-03 ENCOUNTER — Ambulatory Visit (HOSPITAL_COMMUNITY): Payer: Medicare HMO | Admitting: Vascular Surgery

## 2021-05-03 ENCOUNTER — Observation Stay (HOSPITAL_COMMUNITY)
Admission: RE | Admit: 2021-05-03 | Discharge: 2021-05-04 | Disposition: A | Discharge status: Home / Self Care | Payer: Medicare HMO | Attending: Orthopaedic Surgery | Admitting: Orthopaedic Surgery

## 2021-05-03 ENCOUNTER — Other Ambulatory Visit: Payer: Self-pay

## 2021-05-03 ENCOUNTER — Encounter (HOSPITAL_COMMUNITY): Payer: Self-pay | Admitting: Orthopaedic Surgery

## 2021-05-03 ENCOUNTER — Observation Stay (HOSPITAL_COMMUNITY): Payer: Medicare HMO

## 2021-05-03 ENCOUNTER — Encounter (HOSPITAL_COMMUNITY)
Admission: RE | Disposition: A | Discharge status: Home / Self Care | Payer: Self-pay | Source: Home / Self Care | Attending: Orthopaedic Surgery

## 2021-05-03 ENCOUNTER — Other Ambulatory Visit (HOSPITAL_COMMUNITY): Payer: Medicare HMO

## 2021-05-03 DIAGNOSIS — Z7984 Long term (current) use of oral hypoglycemic drugs: Secondary | ICD-10-CM | POA: Insufficient documentation

## 2021-05-03 DIAGNOSIS — Z87891 Personal history of nicotine dependence: Secondary | ICD-10-CM | POA: Diagnosis not present

## 2021-05-03 DIAGNOSIS — Z7982 Long term (current) use of aspirin: Secondary | ICD-10-CM | POA: Diagnosis not present

## 2021-05-03 DIAGNOSIS — E119 Type 2 diabetes mellitus without complications: Secondary | ICD-10-CM | POA: Diagnosis not present

## 2021-05-03 DIAGNOSIS — I10 Essential (primary) hypertension: Secondary | ICD-10-CM | POA: Insufficient documentation

## 2021-05-03 DIAGNOSIS — R52 Pain, unspecified: Secondary | ICD-10-CM

## 2021-05-03 DIAGNOSIS — Z79899 Other long term (current) drug therapy: Secondary | ICD-10-CM | POA: Insufficient documentation

## 2021-05-03 DIAGNOSIS — Z96652 Presence of left artificial knee joint: Secondary | ICD-10-CM

## 2021-05-03 DIAGNOSIS — M1712 Unilateral primary osteoarthritis, left knee: Principal | ICD-10-CM | POA: Insufficient documentation

## 2021-05-03 HISTORY — PX: TOTAL KNEE ARTHROPLASTY: SHX125

## 2021-05-03 LAB — GLUCOSE, CAPILLARY
Glucose-Capillary: 132 mg/dL — ABNORMAL HIGH (ref 70–99)
Glucose-Capillary: 108 mg/dL — ABNORMAL HIGH (ref 70–99)
Glucose-Capillary: 180 mg/dL — ABNORMAL HIGH (ref 70–99)

## 2021-05-03 LAB — ABO/RH: ABO/RH(D): A POS

## 2021-05-03 SURGERY — ARTHROPLASTY, KNEE, TOTAL
Anesthesia: Spinal | Site: Knee | Laterality: Left

## 2021-05-03 MED ORDER — DIPHENHYDRAMINE HCL 50 MG/ML IJ SOLN
12.5000 mg | Freq: Once | INTRAMUSCULAR | Status: AC
  Administered 2021-05-03: 12.5 mg via INTRAVENOUS

## 2021-05-03 MED ORDER — HYDROMORPHONE HCL 1 MG/ML IJ SOLN
0.5000 mg | INTRAMUSCULAR | Status: DC | PRN
Start: 2021-05-03 — End: 2021-05-04
  Administered 2021-05-03: 1 mg via INTRAVENOUS
  Filled 2021-05-03: qty 1

## 2021-05-03 MED ORDER — PHENOL 1.4 % MT LIQD: 1.0000 | OROMUCOSAL | Status: DC | PRN

## 2021-05-03 MED ORDER — FENTANYL CITRATE (PF) 100 MCG/2ML IJ SOLN
25.0000 ug | INTRAMUSCULAR | Status: DC | PRN
  Administered 2021-05-03 (×2): 25 ug via INTRAVENOUS

## 2021-05-03 MED ORDER — SODIUM CHLORIDE 0.9 % IV SOLN: INTRAVENOUS | Status: DC

## 2021-05-03 MED ORDER — LACTATED RINGERS IV SOLN: INTRAVENOUS | Status: DC

## 2021-05-03 MED ORDER — CHLORHEXIDINE GLUCONATE 0.12 % MT SOLN
15.0000 mL | Freq: Once | OROMUCOSAL | Status: DC
  Filled 2021-05-03: qty 15

## 2021-05-03 MED ORDER — ACETAMINOPHEN 325 MG PO TABS: 650.0000 mg | ORAL_TABLET | Freq: Four times a day (QID) | ORAL | Status: DC | PRN

## 2021-05-03 MED ORDER — FENTANYL CITRATE (PF) 100 MCG/2ML IJ SOLN
INTRAMUSCULAR | Status: AC
  Filled 2021-05-03: qty 2

## 2021-05-03 MED ORDER — OXYCODONE HCL 5 MG PO TABS
5.0000 mg | ORAL_TABLET | Freq: Once | ORAL | Status: AC | PRN
  Administered 2021-05-03: 5 mg via ORAL

## 2021-05-03 MED ORDER — ORAL CARE MOUTH RINSE: 15.0000 mL | Freq: Once | OROMUCOSAL | Status: DC

## 2021-05-03 MED ORDER — TRANEXAMIC ACID-NACL 1000-0.7 MG/100ML-% IV SOLN
INTRAVENOUS | Status: AC
  Filled 2021-05-03: qty 100

## 2021-05-03 MED ORDER — DEXAMETHASONE SODIUM PHOSPHATE 10 MG/ML IJ SOLN
10.0000 mg | Freq: Once | INTRAMUSCULAR | Status: AC
  Administered 2021-05-04: 10 mg via INTRAVENOUS
  Filled 2021-05-03: qty 1

## 2021-05-03 MED ORDER — BUPIVACAINE IN DEXTROSE 0.75-8.25 % IT SOLN
INTRATHECAL | Status: DC | PRN
  Administered 2021-05-03: 1.8 mL via INTRATHECAL

## 2021-05-03 MED ORDER — OXYCODONE HCL 5 MG PO TABS
ORAL_TABLET | ORAL | Status: AC
  Filled 2021-05-03: qty 1

## 2021-05-03 MED ORDER — SODIUM CHLORIDE 0.9 % IR SOLN
Status: DC | PRN
  Administered 2021-05-03: 1000 mL

## 2021-05-03 MED ORDER — ACETAMINOPHEN 160 MG/5ML PO SOLN: 1000.0000 mg | Freq: Once | ORAL | Status: DC | PRN

## 2021-05-03 MED ORDER — ONDANSETRON HCL 4 MG/2ML IJ SOLN
INTRAMUSCULAR | Status: DC | PRN
  Administered 2021-05-03: 4 mg via INTRAVENOUS

## 2021-05-03 MED ORDER — METOCLOPRAMIDE HCL 5 MG PO TABS: 5.0000 mg | ORAL_TABLET | Freq: Three times a day (TID) | ORAL | Status: DC | PRN

## 2021-05-03 MED ORDER — OXYCODONE HCL 5 MG PO TABS: 10.0000 mg | ORAL_TABLET | ORAL | Status: DC | PRN

## 2021-05-03 MED ORDER — PHENYLEPHRINE 40 MCG/ML (10ML) SYRINGE FOR IV PUSH (FOR BLOOD PRESSURE SUPPORT)
PREFILLED_SYRINGE | INTRAVENOUS | Status: DC | PRN
  Administered 2021-05-03: 80 ug via INTRAVENOUS

## 2021-05-03 MED ORDER — CEFAZOLIN SODIUM-DEXTROSE 2-4 GM/100ML-% IV SOLN
2.0000 g | Freq: Four times a day (QID) | INTRAVENOUS | Status: AC
  Administered 2021-05-03 – 2021-05-04 (×2): 2 g via INTRAVENOUS
  Filled 2021-05-03 (×2): qty 100

## 2021-05-03 MED ORDER — CEFAZOLIN SODIUM-DEXTROSE 2-4 GM/100ML-% IV SOLN
2.0000 g | INTRAVENOUS | Status: AC
Start: 2021-05-03 — End: 2021-05-03
  Administered 2021-05-03: 2 g via INTRAVENOUS

## 2021-05-03 MED ORDER — PROPOFOL 10 MG/ML IV BOLUS
INTRAVENOUS | Status: DC | PRN
  Administered 2021-05-03: 20 mg via INTRAVENOUS
  Administered 2021-05-03: 40 mg via INTRAVENOUS

## 2021-05-03 MED ORDER — METHOCARBAMOL 1000 MG/10ML IJ SOLN
500.0000 mg | Freq: Four times a day (QID) | INTRAVENOUS | Status: DC | PRN
  Filled 2021-05-03: qty 5

## 2021-05-03 MED ORDER — 0.9 % SODIUM CHLORIDE (POUR BTL) OPTIME
TOPICAL | Status: DC | PRN
  Administered 2021-05-03: 1000 mL

## 2021-05-03 MED ORDER — OXYCODONE HCL 5 MG/5ML PO SOLN: 5.0000 mg | Freq: Once | ORAL | Status: AC | PRN

## 2021-05-03 MED ORDER — BUPIVACAINE-MELOXICAM ER 400-12 MG/14ML IJ SOLN
INTRAMUSCULAR | Status: AC
  Filled 2021-05-03: qty 1

## 2021-05-03 MED ORDER — INSULIN ASPART 100 UNIT/ML IJ SOLN: 0.0000 [IU] | Freq: Every day | INTRAMUSCULAR | Status: DC

## 2021-05-03 MED ORDER — MENTHOL 3 MG MT LOZG: 1.0000 | LOZENGE | OROMUCOSAL | Status: DC | PRN

## 2021-05-03 MED ORDER — MIDAZOLAM HCL 2 MG/2ML IJ SOLN
INTRAMUSCULAR | Status: AC
  Filled 2021-05-03: qty 2

## 2021-05-03 MED ORDER — OXYCODONE HCL 5 MG PO TABS
5.0000 mg | ORAL_TABLET | ORAL | Status: DC | PRN
  Administered 2021-05-03 – 2021-05-04 (×2): 10 mg via ORAL
  Filled 2021-05-03 (×2): qty 2

## 2021-05-03 MED ORDER — METOCLOPRAMIDE HCL 5 MG/ML IJ SOLN: 5.0000 mg | Freq: Three times a day (TID) | INTRAMUSCULAR | Status: DC | PRN

## 2021-05-03 MED ORDER — VANCOMYCIN HCL 1000 MG IV SOLR
INTRAVENOUS | Status: AC
  Filled 2021-05-03: qty 1000

## 2021-05-03 MED ORDER — METHOCARBAMOL 500 MG PO TABS
500.0000 mg | ORAL_TABLET | Freq: Four times a day (QID) | ORAL | Status: DC | PRN
  Administered 2021-05-03 – 2021-05-04 (×3): 500 mg via ORAL
  Filled 2021-05-03 (×3): qty 1

## 2021-05-03 MED ORDER — FENTANYL CITRATE (PF) 250 MCG/5ML IJ SOLN
INTRAMUSCULAR | Status: AC
  Filled 2021-05-03: qty 5

## 2021-05-03 MED ORDER — TRANEXAMIC ACID 1000 MG/10ML IV SOLN
INTRAVENOUS | Status: DC | PRN
  Administered 2021-05-03: 2000 mg via TOPICAL

## 2021-05-03 MED ORDER — DIPHENHYDRAMINE HCL 25 MG PO CAPS
25.0000 mg | ORAL_CAPSULE | ORAL | Status: DC | PRN
  Administered 2021-05-03 – 2021-05-04 (×2): 25 mg via ORAL
  Filled 2021-05-03 (×2): qty 1

## 2021-05-03 MED ORDER — DOCUSATE SODIUM 100 MG PO CAPS
100.0000 mg | ORAL_CAPSULE | Freq: Two times a day (BID) | ORAL | Status: DC
  Administered 2021-05-03 – 2021-05-04 (×2): 100 mg via ORAL
  Filled 2021-05-03 (×2): qty 1

## 2021-05-03 MED ORDER — TRANEXAMIC ACID-NACL 1000-0.7 MG/100ML-% IV SOLN
1000.0000 mg | Freq: Once | INTRAVENOUS | Status: AC
  Administered 2021-05-03: 1000 mg via INTRAVENOUS
  Filled 2021-05-03: qty 100

## 2021-05-03 MED ORDER — VANCOMYCIN HCL 1000 MG IV SOLR
INTRAVENOUS | Status: DC | PRN
  Administered 2021-05-03: 1000 mg via TOPICAL

## 2021-05-03 MED ORDER — ASPIRIN 81 MG PO CHEW
81.0000 mg | CHEWABLE_TABLET | Freq: Two times a day (BID) | ORAL | Status: DC
  Administered 2021-05-03 – 2021-05-04 (×2): 81 mg via ORAL
  Filled 2021-05-03 (×2): qty 1

## 2021-05-03 MED ORDER — DIPHENHYDRAMINE HCL 50 MG/ML IJ SOLN
INTRAMUSCULAR | Status: AC
  Filled 2021-05-03: qty 1

## 2021-05-03 MED ORDER — ONDANSETRON HCL 4 MG PO TABS
4.0000 mg | ORAL_TABLET | Freq: Four times a day (QID) | ORAL | Status: DC | PRN
  Administered 2021-05-03: 4 mg via ORAL
  Filled 2021-05-03: qty 1

## 2021-05-03 MED ORDER — TRANEXAMIC ACID 1000 MG/10ML IV SOLN
2000.0000 mg | INTRAVENOUS | Status: DC
  Filled 2021-05-03: qty 20

## 2021-05-03 MED ORDER — FENTANYL CITRATE (PF) 250 MCG/5ML IJ SOLN
INTRAMUSCULAR | Status: DC | PRN
  Administered 2021-05-03: 100 ug via INTRAVENOUS

## 2021-05-03 MED ORDER — PHENYLEPHRINE 40 MCG/ML (10ML) SYRINGE FOR IV PUSH (FOR BLOOD PRESSURE SUPPORT)
PREFILLED_SYRINGE | INTRAVENOUS | Status: AC
  Filled 2021-05-03: qty 10

## 2021-05-03 MED ORDER — ROPIVACAINE HCL 7.5 MG/ML IJ SOLN
INTRAMUSCULAR | Status: DC | PRN
  Administered 2021-05-03: 20 mL via PERINEURAL

## 2021-05-03 MED ORDER — CEFAZOLIN SODIUM-DEXTROSE 2-4 GM/100ML-% IV SOLN
INTRAVENOUS | Status: AC
  Filled 2021-05-03: qty 100

## 2021-05-03 MED ORDER — PROPOFOL 500 MG/50ML IV EMUL
INTRAVENOUS | Status: DC | PRN
  Administered 2021-05-03: 100 ug/kg/min via INTRAVENOUS

## 2021-05-03 MED ORDER — ACETAMINOPHEN 10 MG/ML IV SOLN: 1000.0000 mg | Freq: Once | INTRAVENOUS | Status: DC | PRN

## 2021-05-03 MED ORDER — MIDAZOLAM HCL 5 MG/5ML IJ SOLN
INTRAMUSCULAR | Status: DC | PRN
  Administered 2021-05-03: 2 mg via INTRAVENOUS

## 2021-05-03 MED ORDER — ACETAMINOPHEN 500 MG PO TABS: 1000.0000 mg | ORAL_TABLET | Freq: Once | ORAL | Status: DC | PRN

## 2021-05-03 MED ORDER — CHLORHEXIDINE GLUCONATE 0.12 % MT SOLN
OROMUCOSAL | Status: AC
  Administered 2021-05-03: 15 mL
  Filled 2021-05-03: qty 15

## 2021-05-03 MED ORDER — ACETAMINOPHEN 500 MG PO TABS
1000.0000 mg | ORAL_TABLET | Freq: Four times a day (QID) | ORAL | Status: AC
  Administered 2021-05-03 – 2021-05-04 (×3): 1000 mg via ORAL
  Filled 2021-05-03 (×3): qty 2

## 2021-05-03 MED ORDER — IRRISEPT - 450ML BOTTLE WITH 0.05% CHG IN STERILE WATER, USP 99.95% OPTIME
TOPICAL | Status: DC | PRN
  Administered 2021-05-03: 450 mL via TOPICAL

## 2021-05-03 MED ORDER — METFORMIN HCL 500 MG PO TABS
500.0000 mg | ORAL_TABLET | Freq: Two times a day (BID) | ORAL | Status: DC
  Administered 2021-05-04: 500 mg via ORAL
  Filled 2021-05-03: qty 1

## 2021-05-03 MED ORDER — BUPIVACAINE-MELOXICAM ER 400-12 MG/14ML IJ SOLN
INTRAMUSCULAR | Status: DC | PRN
  Administered 2021-05-03: 400 mg

## 2021-05-03 MED ORDER — INSULIN ASPART 100 UNIT/ML IJ SOLN
0.0000 [IU] | Freq: Three times a day (TID) | INTRAMUSCULAR | Status: DC
  Administered 2021-05-04: 3 [IU] via SUBCUTANEOUS
  Administered 2021-05-04: 2 [IU] via SUBCUTANEOUS

## 2021-05-03 MED ORDER — TRANEXAMIC ACID-NACL 1000-0.7 MG/100ML-% IV SOLN
1000.0000 mg | INTRAVENOUS | Status: AC
  Administered 2021-05-03: 1000 mg via INTRAVENOUS

## 2021-05-03 MED ORDER — ACETAMINOPHEN 325 MG PO TABS
325.0000 mg | ORAL_TABLET | Freq: Four times a day (QID) | ORAL | Status: DC | PRN
  Administered 2021-05-04: 650 mg via ORAL
  Filled 2021-05-03: qty 2

## 2021-05-03 MED ORDER — ONDANSETRON HCL 4 MG/2ML IJ SOLN: 4.0000 mg | Freq: Four times a day (QID) | INTRAMUSCULAR | Status: DC | PRN

## 2021-05-03 MED ORDER — POVIDONE-IODINE 10 % EX SWAB
2.0000 "application " | Freq: Once | CUTANEOUS | Status: AC
  Administered 2021-05-03: 2 via TOPICAL

## 2021-05-03 SURGICAL SUPPLY — 72 items
ALCOHOL 70% 16 OZ (MISCELLANEOUS) ×2 IMPLANT
BAG COUNTER SPONGE SURGICOUNT (BAG) ×2 IMPLANT
BAG DECANTER FOR FLEXI CONT (MISCELLANEOUS) ×2 IMPLANT
BLADE SAG 18X100X1.27 (BLADE) ×2 IMPLANT
BOWL SMART MIX CTS (DISPOSABLE) ×2 IMPLANT
CEMENT BONE REFOBACIN R1X40 US (Cement) ×4 IMPLANT
CLSR STERI-STRIP ANTIMIC 1/2X4 (GAUZE/BANDAGES/DRESSINGS) ×4 IMPLANT
COMP FEM CMT PS STD 5 LT (Joint) ×2 IMPLANT
COMPONENT FEM CMT PS STD 5 LT (Joint) ×1 IMPLANT
COOLER ICEMAN CLASSIC (MISCELLANEOUS) ×2 IMPLANT
COVER SURGICAL LIGHT HANDLE (MISCELLANEOUS) ×2 IMPLANT
CUFF TOURN SGL QUICK 34 (TOURNIQUET CUFF) ×2
CUFF TRNQT CYL 34X4.125X (TOURNIQUET CUFF) ×1 IMPLANT
DERMABOND ADVANCED (GAUZE/BANDAGES/DRESSINGS) ×1
DERMABOND ADVANCED .7 DNX12 (GAUZE/BANDAGES/DRESSINGS) ×1 IMPLANT
DRAPE EXTREMITY T 121X128X90 (DISPOSABLE) ×2 IMPLANT
DRAPE HALF SHEET 40X57 (DRAPES) ×2 IMPLANT
DRAPE ORTHO SPLIT 77X108 STRL (DRAPES) ×4
DRAPE POUCH INSTRU U-SHP 10X18 (DRAPES) ×2 IMPLANT
DRAPE SURG ORHT 6 SPLT 77X108 (DRAPES) ×2 IMPLANT
DRAPE U-SHAPE 47X51 STRL (DRAPES) ×4 IMPLANT
DRSG AQUACEL AG ADV 3.5X10 (GAUZE/BANDAGES/DRESSINGS) ×2 IMPLANT
DURAPREP 26ML APPLICATOR (WOUND CARE) ×6 IMPLANT
ELECT CAUTERY BLADE 6.4 (BLADE) ×2 IMPLANT
ELECT REM PT RETURN 9FT ADLT (ELECTROSURGICAL) ×2
ELECTRODE REM PT RTRN 9FT ADLT (ELECTROSURGICAL) ×1 IMPLANT
GLOVE SURG LTX SZ7 (GLOVE) ×6 IMPLANT
GLOVE SURG NEOP MICRO LF SZ7.5 (GLOVE) ×6 IMPLANT
GLOVE SURG SYN 7.5  E (GLOVE) ×8
GLOVE SURG SYN 7.5 E (GLOVE) ×4 IMPLANT
GLOVE SURG UNDER POLY LF SZ7 (GLOVE) ×10 IMPLANT
GOWN STRL REIN XL XLG (GOWN DISPOSABLE) ×2 IMPLANT
GOWN STRL REUS W/ TWL LRG LVL3 (GOWN DISPOSABLE) ×1 IMPLANT
GOWN STRL REUS W/TWL LRG LVL3 (GOWN DISPOSABLE) ×2
HANDPIECE INTERPULSE COAX TIP (DISPOSABLE) ×2
HDLS TROCR DRIL PIN KNEE 75 (PIN) ×2
HOOD PEEL AWAY FLYTE STAYCOOL (MISCELLANEOUS) ×4 IMPLANT
INSERT ASF PERS 11 CD/4-5 LT (Insert) ×2 IMPLANT
JET LAVAGE IRRISEPT WOUND (IRRIGATION / IRRIGATOR) ×2
KIT BASIN OR (CUSTOM PROCEDURE TRAY) ×2 IMPLANT
KIT TURNOVER KIT B (KITS) ×2 IMPLANT
LAVAGE JET IRRISEPT WOUND (IRRIGATION / IRRIGATOR) ×1 IMPLANT
MANIFOLD NEPTUNE II (INSTRUMENTS) ×2 IMPLANT
MARKER SKIN DUAL TIP RULER LAB (MISCELLANEOUS) ×2 IMPLANT
NEEDLE SPNL 18GX3.5 QUINCKE PK (NEEDLE) ×4 IMPLANT
NS IRRIG 1000ML POUR BTL (IV SOLUTION) ×2 IMPLANT
PACK TOTAL JOINT (CUSTOM PROCEDURE TRAY) ×2 IMPLANT
PAD ARMBOARD 7.5X6 YLW CONV (MISCELLANEOUS) ×4 IMPLANT
PAD COLD SHLDR WRAP-ON (PAD) ×2 IMPLANT
PIN DRILL HDLS TROCAR 75 4PK (PIN) ×1 IMPLANT
SAW OSC TIP CART 19.5X105X1.3 (SAW) ×2 IMPLANT
SCREW FEMALE HEX FIX 25X2.5 (ORTHOPEDIC DISPOSABLE SUPPLIES) ×2 IMPLANT
SET HNDPC FAN SPRY TIP SCT (DISPOSABLE) ×1 IMPLANT
STAPLER VISISTAT 35W (STAPLE) IMPLANT
STEM POLY PAT PLY 32M KNEE (Knees) ×2 IMPLANT
STEM TIBIA 5 DEG SZ D L KNEE (Knees) ×1 IMPLANT
SUCTION FRAZIER HANDLE 10FR (MISCELLANEOUS) ×4
SUCTION TUBE FRAZIER 10FR DISP (MISCELLANEOUS) ×2 IMPLANT
SUT ETHILON 2 0 FS 18 (SUTURE) ×6 IMPLANT
SUT VIC AB 0 CT1 27 (SUTURE) ×4
SUT VIC AB 0 CT1 27XBRD ANBCTR (SUTURE) ×2 IMPLANT
SUT VIC AB 1 CTX 27 (SUTURE) ×6 IMPLANT
SUT VIC AB 2-0 CT1 27 (SUTURE) ×8
SUT VIC AB 2-0 CT1 TAPERPNT 27 (SUTURE) ×4 IMPLANT
SYR 50ML LL SCALE MARK (SYRINGE) ×4 IMPLANT
TIBIA STEM 5 DEG SZ D L KNEE (Knees) ×2 IMPLANT
TOWEL GREEN STERILE (TOWEL DISPOSABLE) ×2 IMPLANT
TOWEL GREEN STERILE FF (TOWEL DISPOSABLE) ×4 IMPLANT
TRAY CATH 16FR W/PLASTIC CATH (SET/KITS/TRAYS/PACK) IMPLANT
UNDERPAD 30X36 HEAVY ABSORB (UNDERPADS AND DIAPERS) ×2 IMPLANT
WRAP KNEE MAXI GEL POST OP (GAUZE/BANDAGES/DRESSINGS) ×2 IMPLANT
YANKAUER SUCT BULB TIP NO VENT (SUCTIONS) ×2 IMPLANT

## 2021-05-03 NOTE — Anesthesia Procedure Notes (Signed)
Spinal  Patient location during procedure: pre-op Start time: 05/03/2021 12:44 PM End time: 05/03/2021 12:50 PM Reason for block: surgical anesthesia Staffing Performed: anesthesiologist  Anesthesiologist: Oleta Mouse, MD Preanesthetic Checklist Completed: patient identified, IV checked, risks and benefits discussed, surgical consent, monitors and equipment checked, pre-op evaluation and timeout performed Spinal Block Patient position: sitting Prep: DuraPrep Patient monitoring: heart rate, cardiac monitor, continuous pulse ox and blood pressure Approach: midline Location: L3-4 Injection technique: single-shot Needle Needle type: Pencan  Needle gauge: 24 G Needle length: 9 cm Assessment Sensory level: T6 Events: CSF return

## 2021-05-03 NOTE — Progress Notes (Signed)
Patient c/o itching Dr. Ermalene Postin aware new orders

## 2021-05-03 NOTE — Op Note (Signed)
Total Knee Arthroplasty Procedure Note  Preoperative diagnosis: Left knee osteoarthritis  Postoperative diagnosis:same  Operative procedure: Left total knee arthroplasty. CPT [phone]  Surgeon: N. Eduard Roux, MD  Assist: Madalyn Rob, PA-C; necessary for the timely completion of procedure and due to complexity of procedure.  Anesthesia: Spinal, regional, local  Tourniquet time: see anesthesia record  Implants used: Zimmer persona Femur: CR 5 Tibia: D Patella: 32 mm Polyethylene: 11 mm, MC  Indication: Sarah Walsh is a 65 y.o. year old female with a history of knee pain. Having failed conservative management, the patient elected to proceed with a total knee arthroplasty.  We have reviewed the risk and benefits of the surgery and they elected to proceed after voicing understanding.  Procedure:  After informed consent was obtained and understanding of the risk were voiced including but not limited to bleeding, infection, damage to surrounding structures including nerves and vessels, blood clots, leg length inequality and the failure to achieve desired results, the operative extremity was marked with verbal confirmation of the patient in the holding area.   The patient was then brought to the operating room and transported to the operating room table in the supine position.  A tourniquet was applied to the operative extremity around the upper thigh. The operative limb was then prepped and draped in the usual sterile fashion and preoperative antibiotics were administered.  A time out was performed prior to the start of surgery confirming the correct extremity, preoperative antibiotic administration, as well as team members, implants and instruments available for the case. Correct surgical site was also confirmed with preoperative radiographs. The limb was then elevated for exsanguination and the tourniquet was inflated. A midline incision was made and a standard medial  parapatellar approach was performed.  The infrapatellar fat pad was removed.  Suprapatellar synovium was removed to reveal the anterior distal femoral cortex.  A medial peel was performed to release the capsule of the medial tibial plateau.  The patella was then everted and was prepared and sized to a 32 mm.  A cover was placed on the patella for protection from retractors.  The knee was then brought into full flexion and we then turned our attention to the femur.  The femur demonstrated severe degenerative changes.  The cruciates were sacrificed.  Start site was drilled in the femur and the intramedullary distal femoral cutting guide was placed, set at 5 degrees valgus, taking 10 mm of distal resection. The distal cut was made. Osteophytes were then removed.  Next, the proximal tibial cutting guide was placed with appropriate slope, varus/valgus alignment and depth of resection. The proximal tibial cut was made. Gap blocks were then used to assess the extension gap and alignment, and appropriate soft tissue releases were performed. Attention was turned back to the femur, which was sized using the sizing guide to a size 5. Appropriate rotation of the femoral component was determined using epicondylar axis, Whiteside's line, and assessing the flexion gap under ligament tension. The appropriate size 4-in-1 cutting block was placed and checked with an angel wing and cuts were made. Posterior femoral osteophytes and uncapped bone were then removed with the curved osteotome.  Trial components were placed, and stability was checked in full extension, mid-flexion, and deep flexion. Proper tibial rotation was determined and marked.  The patella tracked well without a lateral release.  The femoral lugs were then drilled. Trial components were then removed and tibial preparation performed.  The tibia was sized for a  size D component.   The bony surfaces were irrigated with a pulse lavage and then dried. Bone cement was  vacuum mixed on the back table, and the final components sized above were cemented into place.  Antibiotic irrigation was placed in the knee joint and soft tissues while the cement cured.  After cement had finished curing, excess cement was removed. The stability of the construct was re-evaluated throughout a range of motion and found to be acceptable. The trial liner was removed, the knee was copiously irrigated, and the knee was re-evaluated for any excess bone debris. The real polyethylene liner, 11 mm thick, was inserted and checked to ensure the locking mechanism had engaged appropriately. The tourniquet was deflated and hemostasis was achieved. The wound was irrigated with normal saline.  One gram of vancomycin powder was placed in the surgical bed.  Capsular closure was performed with a #1 vicryl, subcutaneous fat closed with a 0 vicryl suture, then subcutaneous tissue closed with interrupted 2.0 vicryl suture. The skin was then closed with a 2.0 nylon and dermabond. A sterile dressing was applied.  The patient was awakened in the operating room and taken to recovery in stable condition. All sponge, needle, and instrument counts were correct at the end of the case.  Sarah Walsh was necessary for opening, closing, retracting, limb positioning and overall facilitation and completion of the surgery.  Position: supine  Complications: none.  Time Out: performed   Drains/Packing: none  Estimated blood loss: minimal  Returned to Recovery Room: in good condition.   Antibiotics: yes   Mechanical VTE (DVT) Prophylaxis: sequential compression devices, TED thigh-high  Chemical VTE (DVT) Prophylaxis: aspirin  Fluid Replacement  Crystalloid: see anesthesia record Blood: none  FFP: none   Specimens Removed: 1 to pathology   Sponge and Instrument Count Correct? yes   PACU: portable radiograph - knee AP and Lateral   Plan/RTC: Return in 2 weeks for wound check.   Weight Bearing/Load Lower  Extremity: full   Implant Name Type Inv. Item Serial No. Manufacturer Lot No. LRB No. Used Action  CEMENT BONE REFOBACIN [id number] [place] - [id number] Cement CEMENT BONE REFOBACIN [id number] [place]  ZIMMER RECON(ORTH,TRAU,BIO,SG) [id number] Left 1 Implanted  CEMENT BONE REFOBACIN R1X40 [place] - [id number] Cement CEMENT BONE REFOBACIN [id number] [place]  ZIMMER RECON(ORTH,TRAU,BIO,SG) [id number] Left 1 Implanted  TIBIA STEM 5 DEG SZ D L KNEE - [id number] Knees TIBIA STEM 5 DEG SZ D L KNEE  ZIMMER RECON(ORTH,TRAU,BIO,SG) [id number] Left 1 Implanted  COMP FEM CMT PS STD 5 LT - [id number] Joint COMP FEM CMT PS STD 5 LT  ZIMMER RECON(ORTH,TRAU,BIO,SG) [id number] Left 1 Implanted  STEM POLY PAT PLY 78M KNEE - [id number] Knees STEM POLY PAT PLY 78M KNEE  ZIMMER RECON(ORTH,TRAU,BIO,SG) [id number] Left 1 Implanted  Persona personalized knee system left     ZIMMER KNEE [id number] Left 1 Implanted    N. Eduard Roux, MD [facility] 2:33 PM

## 2021-05-03 NOTE — Anesthesia Procedure Notes (Signed)
Anesthesia Regional Block: Adductor canal block   Pre-Anesthetic Checklist: , timeout performed,  Correct Patient, Correct Site, Correct Laterality,  Correct Procedure, Correct Position, site marked,  Risks and benefits discussed,  Surgical consent,  Pre-op evaluation,  At surgeon's request and post-op pain management  Laterality: Left and Lower  Prep: chloraprep       Needles:  Injection technique: Single-shot      Needle Length: 9cm  Needle Gauge: 22     Additional Needles: Arrow StimuQuik ECHO Echogenic Stimulating PNB Needle  Procedures:,,,, ultrasound used (permanent image in chart),,    Narrative:  Start time: 05/03/2021 12:51 PM End time: 05/03/2021 12:55 PM Injection made incrementally with aspirations every 5 mL.  Performed by: Personally  Anesthesiologist: Oleta Mouse, MD

## 2021-05-03 NOTE — Discharge Instructions (Signed)

## 2021-05-03 NOTE — Anesthesia Preprocedure Evaluation (Signed)
Anesthesia Evaluation  Patient identified by MRN, date of birth, ID band Patient awake    Reviewed: Allergy & Precautions, NPO status , Patient's Chart, lab work & pertinent test results  History of Anesthesia Complications Negative for: history of anesthetic complications  Airway Mallampati: II  TM Distance: >3 FB Neck ROM: Full    Dental  (+) Dental Advisory Given, Teeth Intact   Pulmonary former smoker,    breath sounds clear to auscultation       Cardiovascular hypertension, (-) angina(-) Past MI and (-) CHF  Rhythm:Regular     Neuro/Psych PSYCHIATRIC DISORDERS Depression negative neurological ROS     GI/Hepatic   Endo/Other  diabetes  Renal/GU      Musculoskeletal   Abdominal   Peds  Hematology Lab Results      Component                Value               Date                      WBC                      10.4                04/21/2021                HGB                      13.1                04/21/2021                HCT                      40.9                04/21/2021                MCV                      93.6                04/21/2021                PLT                      315                 04/21/2021           Lab Results      Component                Value               Date                      INR                      1.0                 04/21/2021           Denies blood thinners   Anesthesia Other Findings   Reproductive/Obstetrics  Anesthesia Physical Anesthesia Plan  ASA: 2  Anesthesia Plan: MAC, Regional and Spinal   Post-op Pain Management:    Induction: Intravenous  PONV Risk Score and Plan: 2 and Propofol infusion  Airway Management Planned: Nasal Cannula  Additional Equipment: None  Intra-op Plan:   Post-operative Plan:   Informed Consent: I have reviewed the patients History and Physical, chart, labs and discussed  the procedure including the risks, benefits and alternatives for the proposed anesthesia with the patient or authorized representative who has indicated his/her understanding and acceptance.       Plan Discussed with: CRNA and Surgeon  Anesthesia Plan Comments:         Anesthesia Quick Evaluation

## 2021-05-03 NOTE — Transfer of Care (Signed)
Immediate Anesthesia Transfer of Care Note  Patient: Sarah Walsh  Procedure(s) Performed: LEFT TOTAL KNEE ARTHROPLASTY (Left: Knee)  Patient Location: PACU  Anesthesia Type:Spinal  Level of Consciousness: awake  Airway & Oxygen Therapy: Patient Spontanous Breathing  Post-op Assessment: Report given to RN  Post vital signs: stable  Last Vitals:  Vitals Value Taken Time  BP 106/72 05/03/21 1518  Temp    Pulse    Resp 15 05/03/21 1520  SpO2    Vitals shown include unvalidated device data.  Last Pain:  Vitals:   05/03/21 1131  TempSrc:   PainSc: 3       Patients Stated Pain Goal: 3 (64/33/29 5188)  Complications: No notable events documented.

## 2021-05-03 NOTE — Progress Notes (Signed)
PT Cancellation Note  Patient Details Name: Sarah Walsh MRN: [id number] DOB: 1955-12-12   Cancelled Treatment:    Reason Eval/Treat Not Completed: Fatigue/lethargy limiting ability to participate. Pt given pain meds prior. Pt unable to stay awake while eating or talking to me. Will try again tomorrow.    [place] 05/03/2021, 7:01 PM [place] [facility] Pager [phone] Office [phone]

## 2021-05-03 NOTE — H&P (Signed)
PREOPERATIVE H&P  Chief Complaint: left knee degenerative joint disease  HPI: Sarah Walsh is a 65 y.o. female who presents for surgical treatment of left knee degenerative joint disease.  She denies any changes in medical history.  Past Medical History:  Diagnosis Date   Anemia    Arthritis    bilateral knees   Colon polyp 05/29/2019   Depression    on meds-situational anxiety   Diabetes mellitus without complication ([place])    on meds   Hyperlipemia    on meds   Hypertension    hx of    Osteopenia 04/23/2021   DEXA 03/2021: lowest T = -2.0, osteeopenia. Recheck 2 years. Ca/D   Past Surgical History:  Procedure Laterality Date   ABDOMINAL HYSTERECTOMY     AUGMENTATION MAMMAPLASTY Bilateral    36 years    BREAST IMPLANT REMOVAL Bilateral 12/10/2020   Procedure: REMOVAL BREAST IMPLANTS;  Surgeon: Wallace Going, DO;  Location: [place];  Service: Plastics;  Laterality: Bilateral;  2 hours please   CAPSULECTOMY Bilateral 12/10/2020   Procedure: CAPSULECTOMY;  Surgeon: Wallace Going, DO;  Location: [place];  Service: Plastics;  Laterality: Bilateral;   CESAREAN SECTION     4 times   COLONOSCOPY  2020   Gupta-MAC-tics/hems/multiple polyps   POLYPECTOMY  2020   multiple polyps   WISDOM TOOTH EXTRACTION     Social History   Socioeconomic History   Marital status: Single    Spouse name: Not on file   Number of children: Not on file   Years of education: Not on file   Highest education level: Not on file  Occupational History   Occupation: Retired  Tobacco Use   Smoking status: Former    Years: 34.00    Pack years: 0.00    Types: Cigarettes    Quit date: 1985    Years since quitting: 37.5   Smokeless tobacco: Never  Vaping Use   Vaping Use: Never used  Substance and Sexual Activity   Alcohol use: No   Drug use: No   Sexual activity: Not on file  Other Topics Concern   Not on file  Social History Narrative    Not on file   Social Determinants of Health   Financial Resource Strain: Not on file  Food Insecurity: Not on file  Transportation Needs: Not on file  Physical Activity: Not on file  Stress: Not on file  Social Connections: Not on file   Family History  Problem Relation Age of Onset   Alzheimer's disease Mother    Diabetes Daughter    Healthy Daughter    Diabetes Father    Colon polyps Father    Healthy Daughter    Healthy Daughter    Healthy Son    Esophageal cancer Neg Hx    Rectal cancer Neg Hx    Stomach cancer Neg Hx    Colon cancer Neg Hx    Allergies  Allergen Reactions   Codeine Itching and Other (See Comments)   Statins Other (See Comments)    myalgia   Prior to Admission medications   Medication Sig Start Date End Date Taking? Authorizing Provider  Ascorbic Acid (VITAMIN C PO) Take 2,000 mg by mouth daily.   Yes [provider]  B Complex Vitamins (B COMPLEX PO) Take 1 capsule by mouth daily.   Yes [provider]  Cholecalciferol 25 MCG (1000 UT) tablet Take 1,000 Units by mouth  2 (two) times daily.   Yes [provider]  diclofenac (VOLTAREN) 75 MG EC tablet Take 1 tablet (75 mg total) by mouth 2 (two) times daily. 04/06/20  Yes Leamon Arnt, MD  diphenhydrAMINE (BENADRYL) 25 MG tablet Take 25 mg by mouth at bedtime.   Yes [provider]  ezetimibe (ZETIA) 10 MG tablet Take 10 mg by mouth daily.   Yes [provider]  FLUoxetine (PROZAC) 20 MG capsule Take 20 mg by mouth daily.   Yes [provider]  magnesium oxide (MAG-OX) 400 MG tablet Take 400 mg by mouth daily.   Yes [provider]  metFORMIN (GLUCOPHAGE) 500 MG tablet Take 1 tablet (500 mg total) by mouth 2 (two) times daily with a meal. Schedule an appointment for further refills. 04/09/21  Yes Leamon Arnt, MD  Potassium 99 MG TABS Take 99 mg by mouth daily.   Yes [provider]  acetaminophen (TYLENOL) 500 MG tablet Take 1  tablet (500 mg total) by mouth every 6 (six) hours as needed. For use AFTER surgery Patient not taking: No sig reported 11/24/20   Threasa Heads, PA-C  aspirin EC 81 MG tablet Take 1 tablet (81 mg total) by mouth 2 (two) times daily. To be taken after surgery 04/28/21   Aundra Dubin, PA-C  gabapentin (NEURONTIN) 300 MG capsule Take 1 capsule by mouth at bedtime Patient not taking: No sig reported 10/12/20   Leamon Arnt, MD  hydrochlorothiazide (MICROZIDE) 12.5 MG capsule TAKE 1 CAPSULE BY MOUTH ONCE DAILY AS NEEDED Patient not taking: No sig reported 10/12/20   Leamon Arnt, MD  methocarbamol (ROBAXIN) 500 MG tablet Take 1 tablet (500 mg total) by mouth 2 (two) times daily as needed. To be taken after surgery 04/28/21   Aundra Dubin, PA-C  ondansetron (ZOFRAN) 4 MG tablet Take 1 tablet (4 mg total) by mouth every 8 (eight) hours as needed for nausea or vomiting. 04/28/21   Aundra Dubin, PA-C  oxyCODONE-acetaminophen (PERCOCET) 5-325 MG tablet Take 1-2 tablets by mouth every 6 (six) hours as needed. To be taken after surgery 04/28/21   Aundra Dubin, PA-C  sulfamethoxazole-trimethoprim (BACTRIM DS) 800-160 MG tablet Take 1 tablet by mouth 2 (two) times daily. To be taken after surgery 04/28/21   Aundra Dubin, PA-C     Positive ROS: All other systems have been reviewed and were otherwise negative with the exception of those mentioned in the HPI and as above.  Physical Exam: General: Alert, no acute distress Cardiovascular: No pedal edema Respiratory: No cyanosis, no use of accessory musculature GI: abdomen soft Skin: No lesions in the area of chief complaint Neurologic: Sensation intact distally Psychiatric: Patient is competent for consent with normal mood and affect Lymphatic: no lymphedema  MUSCULOSKELETAL: exam stable  Assessment: left knee degenerative joint disease  Plan: Plan for Procedure(s): LEFT TOTAL KNEE ARTHROPLASTY  The risks benefits and alternatives  were discussed with the patient including but not limited to the risks of nonoperative treatment, versus surgical intervention including infection, bleeding, nerve injury,  blood clots, cardiopulmonary complications, morbidity, mortality, among others, and they were willing to proceed.   Preoperative templating of the joint replacement has been completed, documented, and submitted to the Operating Room personnel in order to optimize intra-operative equipment management.   Eduard Roux, MD 05/03/2021 12:03 PM

## 2021-05-04 ENCOUNTER — Encounter (HOSPITAL_COMMUNITY): Payer: Self-pay | Admitting: Orthopaedic Surgery

## 2021-05-04 ENCOUNTER — Other Ambulatory Visit: Payer: Self-pay | Admitting: Physician Assistant

## 2021-05-04 DIAGNOSIS — M1712 Unilateral primary osteoarthritis, left knee: Secondary | ICD-10-CM | POA: Diagnosis not present

## 2021-05-04 LAB — CBC
HCT: 33.6 % — ABNORMAL LOW (ref 36.0–46.0)
Hemoglobin: 11 g/dL — ABNORMAL LOW (ref 12.0–15.0)
MCH: 30.1 pg (ref 26.0–34.0)
MCHC: 32.7 g/dL (ref 30.0–36.0)
MCV: 92.1 fL (ref 80.0–100.0)
Platelets: 229 10*3/uL (ref 150–400)
RBC: 3.65 MIL/uL — ABNORMAL LOW (ref 3.87–5.11)
RDW: 13.2 % (ref 11.5–15.5)
WBC: 11.1 10*3/uL — ABNORMAL HIGH (ref 4.0–10.5)
nRBC: 0 % (ref 0.0–0.2)

## 2021-05-04 LAB — BASIC METABOLIC PANEL
Anion gap: 7 (ref 5–15)
BUN: 6 mg/dL — ABNORMAL LOW (ref 8–23)
CO2: 27 mmol/L (ref 22–32)
Calcium: 8.4 mg/dL — ABNORMAL LOW (ref 8.9–10.3)
Chloride: 98 mmol/L (ref 98–111)
Creatinine, Ser: 0.78 mg/dL (ref 0.44–1.00)
GFR, Estimated: >60 mL/min (ref >60)
Glucose, Bld: 191 mg/dL — ABNORMAL HIGH (ref 70–99)
Potassium: 4.2 mmol/L (ref 3.5–5.1)
Sodium: 132 mmol/L — ABNORMAL LOW (ref 135–145)

## 2021-05-04 LAB — GLUCOSE, CAPILLARY
Glucose-Capillary: 177 mg/dL — ABNORMAL HIGH (ref 70–99)
Glucose-Capillary: 139 mg/dL — ABNORMAL HIGH (ref 70–99)

## 2021-05-04 MED ORDER — SOLIFENACIN SUCCINATE 5 MG PO TABS: 5.0000 mg | ORAL_TABLET | Freq: Every day | ORAL | 0 refills | Status: AC

## 2021-05-04 MED ORDER — TRAMADOL HCL 50 MG PO TABS
50.0000 mg | ORAL_TABLET | Freq: Four times a day (QID) | ORAL | Status: DC | PRN
  Administered 2021-05-04: 100 mg via ORAL
  Filled 2021-05-04: qty 2

## 2021-05-04 MED ORDER — TRAMADOL HCL 50 MG PO TABS: 50.0000 mg | ORAL_TABLET | Freq: Four times a day (QID) | ORAL | 2 refills | Status: DC | PRN

## 2021-05-04 NOTE — Evaluation (Signed)
Physical Therapy Evaluation Patient Details Name: Sarah Walsh MRN: [id number] DOB: [birth year] Today's Date: 05/04/2021   History of Present Illness  Pt adm 7/11 for lt TKR. PMH - arthritis, depression, DM, HTN  Clinical Impression  Pt is s/p TKA resulting in the deficits listed below (see PT Problem List). Pt is currently mod I for bed mobility, supervision for transfers and min guard for ambulation in hallway with RW.  Pt will benefit from skilled PT to increase their independence and safety with mobility to allow discharge to the venue listed below.     Follow Up Recommendations Follow surgeon's recommendation for DC plan and follow-up therapies    Equipment Recommendations  Rolling walker with 5" wheels;3in1 (PT)       Precautions / Restrictions Precautions Precautions: Fall Restrictions Weight Bearing Restrictions: Yes LLE Weight Bearing: Weight bearing as tolerated      Mobility  Bed Mobility Overal bed mobility: Modified Independent             General bed mobility comments: increase time due to L knee pain    Transfers Overall transfer level: Needs assistance Equipment used: Rolling walker (2 wheeled) Transfers: Sit to/from Stand Sit to Stand: Supervision         General transfer comment: cues on hand placement  Ambulation/Gait Ambulation/Gait assistance: Min guard Gait Distance (Feet): 100 Feet Assistive device: Rolling walker (2 wheeled) Gait Pattern/deviations: Step-to pattern;Step-through pattern;Decreased step length - right;Decreased weight shift to left;Shuffle;Antalgic Gait velocity: slowed Gait velocity interpretation: <1.31 ft/sec, indicative of household ambulator General Gait Details: min guard for slow, antalgic gait progressing from step to to step through gait, increased use of UE on RW, slight L knee buckling by end of ambulation      Balance Overall balance assessment: Needs assistance Sitting-balance support: Feet  supported Sitting balance-Leahy Scale: Normal     Standing balance support: No upper extremity supported Standing balance-Leahy Scale: Good                               Pertinent Vitals/Pain Pain Assessment: 0-10 Pain Score: 6  Pain Location: L  knee Pain Descriptors / Indicators: Aching Pain Intervention(s): Limited activity within patient's tolerance;Monitored during session;Repositioned;Ice applied    Home Living Family/patient expects to be discharged to:: Private residence Living Arrangements: Alone Available Help at Discharge: Family;Available 24 hours/day Type of Home: Apartment Home Access: Level entry     Home Layout: One level Home Equipment: Walker - 4 wheels;Bedside commode;Shower seat Additional Comments: Once showed the patient the difference in walkers reported they ahve 4WW    Prior Function Level of Independence: Independent               Hand Dominance   Dominant Hand: Right    Extremity/Trunk Assessment   Upper Extremity Assessment Upper Extremity Assessment: Defer to OT evaluation    Lower Extremity Assessment Lower Extremity Assessment: RLE deficits/detail;LLE deficits/detail RLE Deficits / Details: ROM WFL, strength grossly 4/5 LLE Deficits / Details: L knee ROM limited by pain and edema, hip and ankle WFL LLE Coordination: decreased fine motor    Cervical / Trunk Assessment Cervical / Trunk Assessment: Normal  Communication   Communication: No difficulties  Cognition Arousal/Alertness: Awake/alert Behavior During Therapy: WFL for tasks assessed/performed Overall Cognitive Status: Within Functional Limits for tasks assessed  General Comments General comments (skin integrity, edema, etc.): Pt with increased L LE edema    Exercises Total Joint Exercises Ankle Circles/Pumps: AROM;Both;10 reps;Seated Quad Sets: AROM;Left;10 reps Short Arc Quad: AROM;Left;10  reps;Supine Heel Slides: AROM;Left;10 reps Hip ABduction/ADduction: AROM;Left;10 reps Straight Leg Raises: AROM;Left;10 reps Long Arc Quad: AROM;Left;10 reps Knee Flexion: AROM;Left;10 reps Goniometric ROM: 8-74   Assessment/Plan    PT Assessment Patient needs continued PT services  PT Problem List Decreased strength;Decreased range of motion;Decreased activity tolerance;Decreased balance;Decreased mobility;Pain       PT Treatment Interventions DME instruction;Gait training;Functional mobility training;Therapeutic activities;Therapeutic exercise;Balance training;Cognitive remediation;Patient/family education    PT Goals (Current goals can be found in the Care Plan section)  Acute Rehab PT Goals Patient Stated Goal: to rest PT Goal Formulation: With patient Time For Goal Achievement: 05/18/21 Potential to Achieve Goals: Good    Frequency BID    AM-PAC PT "6 Clicks" Mobility  Outcome Measure Help needed turning from your back to your side while in a flat bed without using bedrails?: None Help needed moving from lying on your back to sitting on the side of a flat bed without using bedrails?: None Help needed moving to and from a bed to a chair (including a wheelchair)?: None Help needed standing up from a chair using your arms (e.g., wheelchair or bedside chair)?: None Help needed to walk in hospital room?: A Little Help needed climbing 3-5 steps with a railing? : A Little 6 Click Score: 22    End of Session Equipment Utilized During Treatment: Gait belt Activity Tolerance: Patient limited by pain Patient left: in chair;with call bell/phone within reach;with chair alarm set Nurse Communication: Mobility status PT Visit Diagnosis: Unsteadiness on feet (R26.81);Other abnormalities of gait and mobility (R26.89);Muscle weakness (generalized) (M62.81);Difficulty in walking, not elsewhere classified (R26.2)    Time: 8115-7262 PT Time Calculation (min) (ACUTE ONLY): 28  min   Charges:   PT Evaluation $PT Eval Low Complexity: 1 Low PT Treatments $Gait Training: 8-22 mins        Rosaline Ezekiel B. Migdalia Dk PT, DPT Acute Rehabilitation Services Pager [phone] Office [phone]   [place] 05/04/2021, 10:09 AM

## 2021-05-04 NOTE — Evaluation (Signed)
Occupational Therapy Evaluation Patient Details Name: Sarah Walsh MRN: [id number] DOB: 03/19/1956 Today's Date: 05/04/2021    History of Present Illness Pt adm 7/11 for lt TKR. PMH - arthritis, depression, DM, HTN   Clinical Impression   Pt admitted with  L TKR. Pt lives alone but will have their former husband to assist 24/7 with the return home. Pt reported they have a 4WW, shower seat and 3 in 1 commode at home. Pt was recommended use of a tub transfer bench and FW walker due to mobility status. Pt was able to complete room level functional mobility with supervision and cues for sequencing. Pt reported "I can only make it to the bathroom right now" . Pt required supervision with UE/LE ADLS on how to complete and requires min assist with shower transfers. Pt currently with functional limitations due to the deficits listed below (see OT Problem List).  Pt will benefit from acute skilled OT to increase their safety and independence with ADL and functional mobility for ADL to facilitate discharge to venue listed below.      Follow Up Recommendations  No OT follow up;Supervision - Intermittent    Equipment Recommendations  Tub/shower bench    Recommendations for Other Services       Precautions / Restrictions Precautions Precautions: Fall Restrictions Weight Bearing Restrictions: Yes LLE Weight Bearing: Weight bearing as tolerated      Mobility Bed Mobility Overal bed mobility: Modified Independent             General bed mobility comments: increase time due to L knee pain    Transfers Overall transfer level: Needs assistance Equipment used: Rolling walker (2 wheeled) Transfers: Sit to/from Stand Sit to Stand: Supervision         General transfer comment: cues on hand placement    Balance Overall balance assessment: Needs assistance Sitting-balance support: Feet supported Sitting balance-Leahy Scale: Normal                                      ADL either performed or assessed with clinical judgement   ADL Overall ADL's : Needs assistance/impaired Eating/Feeding: Independent;Sitting   Grooming: Supervision/safety;Cueing for safety;Cueing for sequencing;Sitting;Standing   Upper Body Bathing: Supervision/ safety;Cueing for safety;Cueing for sequencing;Sitting   Lower Body Bathing: Supervison/ safety;Sit to/from stand   Upper Body Dressing : Supervision/safety;Sitting   Lower Body Dressing: Supervision/safety;Sit to/from stand   Toilet Transfer: [place] and Hygiene: Supervision/safety;Cueing for safety;Cueing for sequencing;Sit to/from stand   Tub/ Shower Transfer: Minimal assistance;Cueing for safety;Cueing for sequencing;Tub bench   Functional mobility during ADLs: Supervision/safety;Cueing for safety;Cueing for sequencing;Rolling walker       Vision Baseline Vision/History: Wears glasses Wears Glasses: Reading only Patient Visual Report: No change from baseline       Perception     Praxis      Pertinent Vitals/Pain Pain Assessment: 0-10 Pain Score: 5  Pain Location: L  knee Pain Descriptors / Indicators: Aching Pain Intervention(s): Limited activity within patient's tolerance;Repositioned;Ice applied     Hand Dominance Right   Extremity/Trunk Assessment Upper Extremity Assessment Upper Extremity Assessment: Generalized weakness   Lower Extremity Assessment Lower Extremity Assessment:  (s/p sx)   Cervical / Trunk Assessment Cervical / Trunk Assessment: Normal   Communication Communication Communication: No difficulties   Cognition Arousal/Alertness: Awake/alert Behavior During Therapy: WFL for tasks assessed/performed Overall Cognitive Status: Within Functional Limits  for tasks assessed                                     General Comments       Exercises     Shoulder Instructions      Home Living Family/patient expects to  be discharged to:: Private residence Living Arrangements: Alone Available Help at Discharge: Family;Available 24 hours/day Type of Home: Apartment Home Access: Level entry     Home Layout: One level     Bathroom Shower/Tub: Teacher, early years/pre: Standard Bathroom Accessibility: Yes   Home Equipment: Walker - 4 wheels;Bedside commode;Shower seat   Additional Comments: Once showed the patient the difference in walkers reported they ahve 4WW      Prior Functioning/Environment Level of Independence: Independent                 OT Problem List: Decreased activity tolerance;Decreased range of motion;Decreased strength;Impaired balance (sitting and/or standing);Decreased safety awareness;Pain      OT Treatment/Interventions: Self-care/ADL training;Therapeutic activities;Patient/family education;Balance training    OT Goals(Current goals can be found in the care plan section) Acute Rehab OT Goals Patient Stated Goal: to rest OT Goal Formulation: With patient Time For Goal Achievement: 05/15/21 Potential to Achieve Goals: Good ADL Goals Pt Will Transfer to Toilet: with modified independence;ambulating;regular height toilet Pt Will Perform Tub/Shower Transfer: with modified independence;tub bench;rolling walker;ambulating  OT Frequency: Min 2X/week   Barriers to D/C:            Co-evaluation              AM-PAC OT "6 Clicks" Daily Activity     Outcome Measure Help from another person eating meals?: None Help from another person taking care of personal grooming?: A Little Help from another person toileting, which includes using toliet, bedpan, or urinal?: A Little Help from another person bathing (including washing, rinsing, drying)?: A Little Help from another person to put on and taking off regular upper body clothing?: A Little Help from another person to put on and taking off regular lower body clothing?: A Little 6 Click Score: 19   End of  Session Equipment Utilized During Treatment: Gait belt;Rolling walker  Activity Tolerance: Patient tolerated treatment well Patient left: in bed;with call bell/phone within reach;with nursing/sitter in room  OT Visit Diagnosis: Unsteadiness on feet (R26.81);Other abnormalities of gait and mobility (R26.89);Muscle weakness (generalized) (M62.81);Pain Pain - Right/Left: Left Pain - part of body: Knee                Time: 0705-0731 OT Time Calculation (min): 26 min Charges:  OT General Charges $OT Visit: 1 Visit OT Evaluation $OT Eval Low Complexity: 1 Low OT Treatments $Self Care/Home Management : 8-22 mins  Joeseph Amor OTR/L  Acute Rehab Services  [phone] office number [phone] pager number   Joeseph Amor 05/04/2021, 7:44 AM

## 2021-05-04 NOTE — Anesthesia Postprocedure Evaluation (Signed)
Anesthesia Post Note  Patient: Sarah Walsh  Procedure(s) Performed: LEFT TOTAL KNEE ARTHROPLASTY (Left: Knee)     Patient location during evaluation: PACU Anesthesia Type: MAC, Spinal and Regional Level of consciousness: awake and alert Pain management: pain level controlled Vital Signs Assessment: post-procedure vital signs reviewed and stable Respiratory status: spontaneous breathing, nonlabored ventilation, respiratory function stable and patient connected to nasal cannula oxygen Cardiovascular status: stable and blood pressure returned to baseline Postop Assessment: no apparent nausea or vomiting Anesthetic complications: no   No notable events documented.  Last Vitals:  Vitals:   05/04/21 0337 05/04/21 0730  BP: 139/79 (!) 145/83  Pulse: 93 91  Resp: 18 18  Temp: 36.7 C 36.6 C  SpO2: 95% 95%    Last Pain:  Vitals:   05/04/21 0956  TempSrc:   PainSc: 7                  Jaquis Picklesimer

## 2021-05-04 NOTE — Progress Notes (Signed)
Subjective: 1 Day Post-Op Procedure(s) (LRB): LEFT TOTAL KNEE ARTHROPLASTY (Left) Patient reports pain as mild.    Objective: Vital signs in last 24 hours: Temp:  [97.7 F (36.5 C)-98.4 F (36.9 C)] 97.9 F (36.6 C) (07/12 0730) Pulse Rate:  [75-95] 91 (07/12 0730) Resp:  [12-20] 18 (07/12 0730) BP: (106-163)/(72-96) 145/83 (07/12 0730) SpO2:  [88 %-100 %] 95 % (07/12 0730) Weight:  [77.1 kg] 77.1 kg (07/11 1114)  Intake/Output from previous day: 07/11 0701 - 07/12 0700 In: 100 [IV Piggyback:100] Out: 500 [Urine:400; Blood:100] Intake/Output this shift: No intake/output data recorded.  Recent Labs    05/04/21 0457  HGB 11.0*   Recent Labs    05/04/21 0457  WBC 11.1*  RBC 3.65*  HCT 33.6*  PLT 229   Recent Labs    05/04/21 0457  NA 132*  K 4.2  CL 98  CO2 27  BUN 6*  CREATININE 0.78  GLUCOSE 191*  CALCIUM 8.4*   No results for input(s): LABPT, INR in the last 72 hours.  Neurologically intact Neurovascular intact Sensation intact distally Intact pulses distally Dorsiflexion/Plantar flexion intact Incision: dressing C/D/I No cellulitis present Compartment soft   Assessment/Plan: 1 Day Post-Op Procedure(s) (LRB): LEFT TOTAL KNEE ARTHROPLASTY (Left) Advance diet Up with therapy D/C IV fluids Discharge home with home health after second PT session Severe itching- will d/c oxycodone/dilaudid. Have added tramadol.  Will see how well pain is controlled with this. I have also called in to pharmacy   Anticipated LOS equal to or greater than 2 midnights due to - Age 67 and older with one or more of the following:  - Obesity  - Expected need for hospital services (PT, OT, Nursing) required for safe  discharge  - Anticipated need for postoperative skilled nursing care or inpatient rehab  - Active co-morbidities: Diabetes OR   - Unanticipated findings during/Post Surgery: None  - Patient is a high risk of re-admission due to: None   Aundra Dubin 05/04/2021, 8:11 AM

## 2021-05-04 NOTE — Progress Notes (Signed)
Patient is discharged from room 3C07 at this time. Alert and in stable condition. IV site d/c'd and instructions read to patient and spouse with understanding verbalized. Left unit via wheelchair with belongings at side.

## 2021-05-04 NOTE — Progress Notes (Signed)
Physical Therapy Treatment Patient Details Name: Sarah Walsh MRN: [id number] DOB: [birth year] Today's Date: 05/04/2021    History of Present Illness Pt adm 7/11 for lt TKR. PMH - arthritis, depression, DM, HTN    PT Comments    Pt continues to make progress towards her goals this afternoon. Able to progress ambulation with decreased pain level. Provided printed HEP and reviewed. Pt mobilizes at a Mod I level from bed mobility and transfers and progressed to supervision with ambulation using RW. D/c plan is appropriate.     Follow Up Recommendations  Follow surgeon's recommendation for DC plan and follow-up therapies     Equipment Recommendations  Rolling walker with 5" wheels;3in1 (PT)    Recommendations for Other Services       Precautions / Restrictions Precautions Precautions: Fall Restrictions Weight Bearing Restrictions: Yes LLE Weight Bearing: Weight bearing as tolerated    Mobility  Bed Mobility Overal bed mobility: Modified Independent             General bed mobility comments: increase time due to L knee pain    Transfers Overall transfer level: Needs assistance Equipment used: Rolling walker (2 wheeled) Transfers: Sit to/from Stand Sit to Stand: Modified independent (Device/Increase time)         General transfer comment: increased effort but able to static stand before RW in front of her  Ambulation/Gait Ambulation/Gait assistance: Min guard;Supervision Gait Distance (Feet): 300 Feet Assistive device: Rolling walker (2 wheeled) Gait Pattern/deviations: Step-to pattern;Step-through pattern;Decreased step length - right;Decreased weight shift to left;Shuffle;Antalgic Gait velocity: slowed Gait velocity interpretation: <1.31 ft/sec, indicative of household ambulator General Gait Details: min guard progressing to supervision for ambulation with RW, decreased height of RW and pt with increased comfort in shoulders and UE         Balance  Overall balance assessment: Needs assistance Sitting-balance support: Feet supported Sitting balance-Leahy Scale: Normal     Standing balance support: No upper extremity supported Standing balance-Leahy Scale: Good                              Cognition Arousal/Alertness: Awake/alert Behavior During Therapy: WFL for tasks assessed/performed Overall Cognitive Status: Within Functional Limits for tasks assessed                                        Exercises Total Joint Exercises Ankle Circles/Pumps: AROM;Both;10 reps;Seated Quad Sets: AROM;Left;10 reps Heel Slides: AROM;Left;Supine    General Comments General comments (skin integrity, edema, etc.): Educated pt on proper technique to get in and out of car. Provided pt with printed HEP and reviewed with her      Pertinent Vitals/Pain Pain Assessment: 0-10 Pain Score: 4  Pain Location: L  knee Pain Descriptors / Indicators: Aching Pain Intervention(s): Limited activity within patient's tolerance;Monitored during session;Repositioned;Ice applied     PT Goals (current goals can now be found in the care plan section) Acute Rehab PT Goals Patient Stated Goal: to rest PT Goal Formulation: With patient Time For Goal Achievement: 05/18/21 Potential to Achieve Goals: Good Progress towards PT goals: Progressing toward goals    Frequency    BID      PT Plan Current plan remains appropriate       AM-PAC PT "6 Clicks" Mobility   Outcome Measure  Help needed turning from your back to your  side while in a flat bed without using bedrails?: None Help needed moving from lying on your back to sitting on the side of a flat bed without using bedrails?: None Help needed moving to and from a bed to a chair (including a wheelchair)?: None Help needed standing up from a chair using your arms (e.g., wheelchair or bedside chair)?: None Help needed to walk in hospital room?: A Little Help needed climbing  3-5 steps with a railing? : A Little 6 Click Score: 22    End of Session Equipment Utilized During Treatment: Gait belt Activity Tolerance: Patient limited by pain Patient left: in chair;with call bell/phone within reach;with chair alarm set Nurse Communication: Mobility status PT Visit Diagnosis: Unsteadiness on feet (R26.81);Other abnormalities of gait and mobility (R26.89);Muscle weakness (generalized) (M62.81);Difficulty in walking, not elsewhere classified (R26.2)     Time: 3614-4315 PT Time Calculation (min) (ACUTE ONLY): 20 min  Charges:  $Gait Training: 8-22 mins                     Zerline Melchior B. Migdalia Dk PT, DPT Acute Rehabilitation Services Pager [phone] Office [phone]'   [place] 05/04/2021, 1:45 PM

## 2021-05-04 NOTE — Discharge Summary (Signed)
Patient ID: Sarah Walsh MRN: [id number] DOB/AGE: [birth year] 65 y.o.  Admit date: 05/03/2021 Discharge date: 05/04/2021  Admission Diagnoses:  Principal Problem:   Primary osteoarthritis of left knee Active Problems:   Status post total left knee replacement   Discharge Diagnoses:  Same  Past Medical History:  Diagnosis Date   Anemia    Arthritis    bilateral knees   Colon polyp 05/29/2019   Depression    on meds-situational anxiety   Diabetes mellitus without complication ([place])    on meds   Hyperlipemia    on meds   Hypertension    hx of    Osteopenia 04/23/2021   DEXA 03/2021: lowest T = -2.0, osteeopenia. Recheck 2 years. Ca/D    Surgeries: Procedure(s): LEFT TOTAL KNEE ARTHROPLASTY on 05/03/2021   Consultants:   Discharged Condition: Improved  Hospital Course: Sarah Walsh is an 65 y.o. female who was admitted 05/03/2021 for operative treatment ofPrimary osteoarthritis of left knee. Patient has severe unremitting pain that affects sleep, daily activities, and work/hobbies. After pre-op clearance the patient was taken to the operating room on 05/03/2021 and underwent  Procedure(s): LEFT TOTAL KNEE ARTHROPLASTY.    Patient was given perioperative antibiotics:  Anti-infectives (From admission, onward)    Start     Dose/Rate Route Frequency Ordered Stop   05/03/21 1845  ceFAZolin (ANCEF) IVPB 2g/100 mL premix        2 g 200 mL/hr over 30 Minutes Intravenous Every 6 hours 05/03/21 1755 05/04/21 0949   05/03/21 1338  vancomycin (VANCOCIN) powder  Status:  Discontinued          As needed 05/03/21 1338 05/03/21 1513   05/03/21 1115  ceFAZolin (ANCEF) IVPB 2g/100 mL premix        2 g 200 mL/hr over 30 Minutes Intravenous On call to O.R. 05/03/21 1101 05/03/21 1312   05/03/21 1105  ceFAZolin (ANCEF) 2-4 GM/100ML-% IVPB       Note to Pharmacy: Roosvelt Maser   : cabinet override      05/03/21 1105 05/03/21 1321        Patient was given sequential compression  devices, early ambulation, and chemoprophylaxis to prevent DVT.  Patient benefited maximally from hospital stay and there were no complications.    Recent vital signs: Patient Vitals for the past 24 hrs:  BP Temp Temp src Pulse Resp SpO2  05/04/21 1155 116/73 97.9 F (36.6 C) Oral 93 18 96 %  05/04/21 0730 (!) 145/83 97.9 F (36.6 C) Oral 91 18 95 %  05/04/21 0337 139/79 98.1 F (36.7 C) Oral 93 18 95 %  05/03/21 2333 -- -- -- 94 -- 93 %  05/03/21 2333 (!) 144/76 98.4 F (36.9 C) Oral 95 18 (!) 88 %  05/03/21 1932 (!) 143/73 98.3 F (36.8 C) Oral 92 18 92 %  05/03/21 1748 (!) 161/94 97.7 F (36.5 C) Oral 89 20 99 %  05/03/21 1655 140/87 97.9 F (36.6 C) -- 78 12 95 %     Recent laboratory studies:  Recent Labs    05/04/21 0457  WBC 11.1*  HGB 11.0*  HCT 33.6*  PLT 229  NA 132*  K 4.2  CL 98  CO2 27  BUN 6*  CREATININE 0.78  GLUCOSE 191*  CALCIUM 8.4*     Discharge Medications:   Allergies as of 05/04/2021       Reactions   Codeine Itching, Other (See Comments)   Statins Other (See Comments)  myalgia        Medication List     STOP taking these medications    diclofenac 75 MG EC tablet Commonly known as: VOLTAREN   oxyCODONE-acetaminophen 5-325 MG tablet Commonly known as: Percocet       TAKE these medications    aspirin EC 81 MG tablet Take 1 tablet (81 mg total) by mouth 2 (two) times daily. To be taken after surgery   B COMPLEX PO Take 1 capsule by mouth daily.   Cholecalciferol 25 MCG (1000 UT) tablet Take 1,000 Units by mouth 2 (two) times daily.   diphenhydrAMINE 25 MG tablet Commonly known as: BENADRYL Take 25 mg by mouth at bedtime.   ezetimibe 10 MG tablet Commonly known as: ZETIA Take 10 mg by mouth daily.   FLUoxetine 20 MG capsule Commonly known as: PROZAC Take 20 mg by mouth daily.   magnesium oxide 400 MG tablet Commonly known as: MAG-OX Take 400 mg by mouth daily.   metFORMIN 500 MG tablet Commonly known as:  GLUCOPHAGE Take 1 tablet (500 mg total) by mouth 2 (two) times daily with a meal. Schedule an appointment for further refills.   methocarbamol 500 MG tablet Commonly known as: Robaxin Take 1 tablet (500 mg total) by mouth 2 (two) times daily as needed. To be taken after surgery   ondansetron 4 MG tablet Commonly known as: Zofran Take 1 tablet (4 mg total) by mouth every 8 (eight) hours as needed for nausea or vomiting.   Potassium 99 MG Tabs Take 99 mg by mouth daily.   solifenacin 5 MG tablet Commonly known as: VESICARE Take 1 tablet (5 mg total) by mouth daily.   sulfamethoxazole-trimethoprim 800-160 MG tablet Commonly known as: BACTRIM DS Take 1 tablet by mouth 2 (two) times daily. To be taken after surgery   traMADol 50 MG tablet Commonly known as: ULTRAM Take 1-2 tablets (50-100 mg total) by mouth every 6 (six) hours as needed.   VITAMIN C PO Take 2,000 mg by mouth daily.       ASK your doctor about these medications    acetaminophen 500 MG tablet Commonly known as: TYLENOL Take 1 tablet (500 mg total) by mouth every 6 (six) hours as needed. For use AFTER surgery   gabapentin 300 MG capsule Commonly known as: NEURONTIN Take 1 capsule by mouth at bedtime   hydrochlorothiazide 12.5 MG capsule Commonly known as: [place] 1 CAPSULE BY MOUTH ONCE DAILY AS NEEDED               Durable Medical Equipment  (From admission, onward)           Start     Ordered   05/03/21 1756  DME Walker rolling  Once       Question Answer Comment  Walker: With 5 Inch Wheels   Patient needs a walker to treat with the following condition Status post left partial knee replacement      05/03/21 1755   05/03/21 1756  DME 3 n 1  Once        05/03/21 1755   05/03/21 1756  DME Bedside commode  Once       Question:  Patient needs a bedside commode to treat with the following condition  Answer:  Status post left partial knee replacement   05/03/21 1755             Diagnostic Studies: DG Chest 2 View  Result Date: 04/22/2021 CLINICAL DATA:  Preop.  Left knee osteoarthritis, left total knee arthroplasty. EXAM: CHEST - 2 VIEW COMPARISON:  Remote radiograph 07/31/2007 FINDINGS: The cardiomediastinal contours are normal. Subsegmental atelectasis or scarring in the lingula. Pulmonary vasculature is normal. No confluent consolidation, pleural effusion, or pneumothorax. Thoracic spondylosis with endplate spurring. No acute osseous abnormalities are seen. IMPRESSION: Subsegmental atelectasis or scarring in the lingula. Otherwise unremarkable radiographs of the chest. Electronically Signed   By: Keith Rake M.D.   On: 04/22/2021 14:03   DG Bone Density  Result Date: 04/22/2021 EXAM: DUAL X-RAY ABSORPTIOMETRY (DXA) FOR BONE MINERAL DENSITY IMPRESSION: Referring Physician:  [place] Your patient completed a bone mineral density test using GE Lunar iDXA system (analysis version: 16). Technologist: [place] PATIENT: Name: Karrin, Eisenmenger Patient ID: [id number] Birth Date: 02/03/1956 Height: 61.5 in. Sex: Female Measured: 04/21/2021 Weight: 172.8 lbs. Indications: Bilateral Ovariectomy (65.51), Caucasian, Diabetic non insulin, Estrogen Deficient, Gabapentin, Hysterectomy, Postmenopausal, Secondary Osteoporosis Fractures: None Treatments: Vitamin D (E933.5) ASSESSMENT: The BMD measured at AP Spine L1-L4 (L2) is 0.934 g/cm2 with a T-score of -2.0. This patient is considered osteopenic/low bone mass according to [facility]) criteria. The quality of the exam is good. L2 was excluded due to degenerative changes. Site Region Measured Date Measured Age YA BMD Significant CHANGE T-score AP Spine  L1-L4 (L2) 04/21/2019    67.1         -2.0    0.934 g/cm2 DualFemur Neck Left 04/21/2021 67.1 -1.0 0.894 g/cm2 * DualFemur Neck Left  03/01/2001    47.0         0.7     1.135 g/cm2 DualFemur Total Mean 04/21/2021    67.1         -0.7    0.924 g/cm2 World Health  Organization [facility]) criteria for post-menopausal, Caucasian Women: Normal       T-score at or above -1 SD Osteopenia   T-score between -1 and -2.5 SD Osteoporosis T-score at or below -2.5 SD RECOMMENDATION: 1. All patients should optimize calcium and vitamin D intake. 2. Consider FDA-approved medical therapies in postmenopausal women and men aged 67 years and older, based on the following: a. A hip or vertebral (clinical or morphometric) fracture. b. T-score = -2.5 at the femoral neck or spine after appropriate evaluation to exclude secondary causes. c. Low bone mass (T-score between -1.0 and -2.5 at the femoral neck or spine) and a 10-year probability of a hip fracture = 3% or a 10-year probability of a major osteoporosis-related fracture = 20% based on the [place]-adapted WHO algorithm. d. Clinician judgment and/or patient preferences may indicate treatment for people with 10-year fracture probabilities above or below these levels. FOLLOW-UP: Patients with diagnosis of osteoporosis or at high risk for fracture should have regular bone mineral density tests.? Patients eligible for Medicare are allowed routine testing every 2 years.? The testing frequency can be increased to one year for patients who have rapidly progressing disease, are receiving or discontinuing medical therapy to restore bone mass, or have additional risk factors. I have reviewed this study and agree with the findings. [facility] FRAX* 10-year Probability of Fracture Based on femoral neck BMD: DualFemur (Left) Major Osteoporotic Fracture: 8.1% Hip Fracture:                0.7% Population:                  [place] (Caucasian) Risk Factors:  Secondary Osteoporosis *FRAX is a Materials engineer of the [employer] for Metabolic Bone Disease, a World Pharmacologist (WHO) [facility]. ASSESSMENT: The probability of a major osteoporotic fracture is 8.1% within the next ten years. The  probability of a hip fracture is 0.7% within the next ten years. Electronically Signed   By: Rolm Baptise M.D.   On: 04/22/2021 03:46   DG Knee Left Port  Result Date: 05/03/2021 CLINICAL DATA:  Left knee arthroplasty EXAM: PORTABLE LEFT KNEE - 1-2 VIEW COMPARISON:  12/14/2020 FINDINGS: Interval postsurgical changes from left total knee arthroplasty. Arthroplasty components are in their expected alignment. No periprosthetic fracture or evidence of other complication. Expected postoperative changes within the overlying soft tissues. IMPRESSION: Satisfactory postoperative appearance status post left total knee arthroplasty. Electronically Signed   By: Davina Poke D.O.   On: 05/03/2021 20:59    Disposition: Discharge disposition: 01-Home or Self Care          Follow-up Information     Leandrew Koyanagi, MD. Schedule an appointment as soon as possible for a visit in 2 week(s).   Specialty: Orthopedic Surgery Contact information: [address] [place] [address] [phone]                  Signed: Aundra Dubin 05/04/2021, 4:38 PM

## 2021-05-13 ENCOUNTER — Other Ambulatory Visit: Payer: Self-pay | Admitting: Family Medicine

## 2021-05-14 ENCOUNTER — Other Ambulatory Visit: Payer: Self-pay | Admitting: Physician Assistant

## 2021-05-14 ENCOUNTER — Telehealth: Payer: Self-pay | Admitting: Orthopaedic Surgery

## 2021-05-14 MED ORDER — OXYCODONE-ACETAMINOPHEN 5-325 MG PO TABS: 1.0000 | ORAL_TABLET | Freq: Four times a day (QID) | ORAL | 0 refills | Status: DC | PRN

## 2021-05-14 NOTE — Telephone Encounter (Signed)
Sent in.  Should be getting hhpt now and will likely transition to oppt once we see her

## 2021-05-14 NOTE — Telephone Encounter (Signed)
VM full. Unable to leave VM. Just need to advise on message below.

## 2021-05-14 NOTE — Telephone Encounter (Signed)
Pt wants refill on oxycodone.  Also wants to know when she is going to start outpatient rehab?   CB [phone]

## 2021-05-18 ENCOUNTER — Encounter: Payer: Self-pay | Admitting: Orthopaedic Surgery

## 2021-05-18 ENCOUNTER — Ambulatory Visit (INDEPENDENT_AMBULATORY_CARE_PROVIDER_SITE_OTHER): Payer: Medicare HMO | Admitting: Physician Assistant

## 2021-05-18 DIAGNOSIS — Z96652 Presence of left artificial knee joint: Secondary | ICD-10-CM

## 2021-05-18 NOTE — Progress Notes (Signed)
Post-Op Visit Note   Patient: Sarah Walsh           Date of Birth: 1955-12-21           MRN: [id number] Visit Date: 05/18/2021 PCP: Sueanne Margarita, DO   Assessment & Plan:  Chief Complaint:  Chief Complaint  Patient presents with   Left Knee - Routine Post Op, Follow-up   Visit Diagnoses:  1. H/O total knee replacement, left     Plan: Patient is a pleasant 65 year old female who comes in today 2 weeks out left total knee replacement , Date of surgery 05/03/2021.  She has been doing well.  She is progressing nicely with home health physical therapy.  She is ambulating with a cane.  She is taking occasional oxycodone which does seem to relieve her symptoms.  Examination of the left knee reveals a fully healed surgical incision with nylon sutures in place.  No evidence of infection or cellulitis.  Calf is soft nontender.  She is neurovascular intact distally.  At this point, she will finish out her remaining home health PT sessions and we will then transition her to an outpatient setting.  Internal referral has been made.  Follow-up with [place] in 4 weeks time for repeat evaluation and 2 view x-rays of the left knee.  Dental prophylaxis reinforced.  Call with concerns or questions. Follow-Up Instructions: Return in about 4 weeks (around 06/15/2021).   Orders:  No orders of the defined types were placed in this encounter.  No orders of the defined types were placed in this encounter.   Imaging: No new imaging  PMFS History: Patient Active Problem List   Diagnosis Date Noted   Status post total left knee replacement 05/03/2021   Osteopenia 04/23/2021   Primary osteoarthritis of left knee 02/09/2021   Breast implant rupture 10/13/2020   Osteoarthritis of spine with radiculopathy, lumbar region 07/15/2020   Myalgia due to statin 02/24/2020   Reactive depression 11/06/2019   Colon polyp 05/29/2019   Controlled type 2 diabetes mellitus without complication, without long-term current  use of insulin ([place]) 03/22/2019   Patellofemoral pain syndrome of both knees 07/04/2018   Lower extremity edema 03/03/2017   Vitamin D deficiency 11/23/2015   Combined hyperlipidemia associated with type 2 diabetes mellitus ([place]) 10/22/2015   Primary osteoarthritis of both knees 10/22/2015   Statin intolerance 10/22/2015   Essential hypertension 11/05/2008   Past Medical History:  Diagnosis Date   Anemia    Arthritis    bilateral knees   Colon polyp 05/29/2019   Depression    on meds-situational anxiety   Diabetes mellitus without complication ([place])    on meds   Hyperlipemia    on meds   Hypertension    hx of    Osteopenia 04/23/2021   DEXA 03/2021: lowest T = -2.0, osteeopenia. Recheck 2 years. Ca/D    Family History  Problem Relation Age of Onset   Alzheimer's disease Mother    Diabetes Daughter    Healthy Daughter    Diabetes Father    Colon polyps Father    Healthy Daughter    Healthy Daughter    Healthy Son    Esophageal cancer Neg Hx    Rectal cancer Neg Hx    Stomach cancer Neg Hx    Colon cancer Neg Hx     Past Surgical History:  Procedure Laterality Date   ABDOMINAL HYSTERECTOMY     AUGMENTATION MAMMAPLASTY Bilateral    36 years  BREAST IMPLANT REMOVAL Bilateral 12/10/2020   Procedure: REMOVAL BREAST IMPLANTS;  Surgeon: Wallace Going, DO;  Location: [facility];  Service: Plastics;  Laterality: Bilateral;  2 hours please   CAPSULECTOMY Bilateral 12/10/2020   Procedure: CAPSULECTOMY;  Surgeon: Wallace Going, DO;  Location: [facility];  Service: Plastics;  Laterality: Bilateral;   CESAREAN SECTION     4 times   COLONOSCOPY  2020   Gupta-MAC-tics/hems/multiple polyps   POLYPECTOMY  2020   multiple polyps   TOTAL KNEE ARTHROPLASTY Left 05/03/2021   Procedure: LEFT TOTAL KNEE ARTHROPLASTY;  Surgeon: Leandrew Koyanagi, MD;  Location: [facility];  Service: Orthopedics;  Laterality: Left;   WISDOM TOOTH EXTRACTION     Social  History   Occupational History   Occupation: Retired  Tobacco Use   Smoking status: Former    Years: 34.00    Types: Cigarettes    Quit date: 1985    Years since quitting: 37.5   Smokeless tobacco: Never  Vaping Use   Vaping Use: Never used  Substance and Sexual Activity   Alcohol use: No   Drug use: No   Sexual activity: Not on file

## 2021-05-20 ENCOUNTER — Ambulatory Visit: Payer: Medicare HMO | Admitting: Physical Therapy

## 2021-05-20 ENCOUNTER — Encounter: Payer: Self-pay | Admitting: Physical Therapy

## 2021-05-20 ENCOUNTER — Other Ambulatory Visit: Payer: Self-pay

## 2021-05-20 ENCOUNTER — Other Ambulatory Visit: Payer: Self-pay | Admitting: Family Medicine

## 2021-05-20 DIAGNOSIS — M6281 Muscle weakness (generalized): Secondary | ICD-10-CM | POA: Diagnosis not present

## 2021-05-20 DIAGNOSIS — M25562 Pain in left knee: Secondary | ICD-10-CM | POA: Diagnosis not present

## 2021-05-20 DIAGNOSIS — R600 Localized edema: Secondary | ICD-10-CM | POA: Diagnosis not present

## 2021-05-20 DIAGNOSIS — R2689 Other abnormalities of gait and mobility: Secondary | ICD-10-CM | POA: Diagnosis not present

## 2021-05-20 DIAGNOSIS — M25662 Stiffness of left knee, not elsewhere classified: Secondary | ICD-10-CM

## 2021-05-20 NOTE — Therapy (Addendum)
[facility] Physical Therapy [address] [place], [place], [address] Phone: [phone]   Fax:  [phone]  Physical Therapy Evaluation/Discharge Summary  Patient Details  Name: Sarah Walsh MRN: [id number] Date of Birth: July 24, 1956 Referring Provider (PT): Aundra Dubin, [place]   Encounter Date: 05/20/2021   PT End of Session - 05/20/21 1646     Visit Number 1    Number of Visits 15    Date for PT Re-Evaluation 07/15/21    Authorization Type [employer]    PT Start Time 2353    PT Stop Time 1650    PT Time Calculation (min) 40 min    Activity Tolerance Patient tolerated treatment well    Behavior During Therapy [facility] for tasks assessed/performed             Past Medical History:  Diagnosis Date   Anemia    Arthritis    bilateral knees   Colon polyp 05/29/2019   Depression    on meds-situational anxiety   Diabetes mellitus without complication ([place])    on meds   Hyperlipemia    on meds   Hypertension    hx of    Osteopenia 04/23/2021   DEXA 03/2021: lowest T = -2.0, osteeopenia. Recheck 2 years. Ca/D    Past Surgical History:  Procedure Laterality Date   ABDOMINAL HYSTERECTOMY     AUGMENTATION MAMMAPLASTY Bilateral    36 years    BREAST IMPLANT REMOVAL Bilateral 12/10/2020   Procedure: REMOVAL BREAST IMPLANTS;  Surgeon: Wallace Going, DO;  Location: [place];  Service: Plastics;  Laterality: Bilateral;  2 hours please   CAPSULECTOMY Bilateral 12/10/2020   Procedure: CAPSULECTOMY;  Surgeon: Wallace Going, DO;  Location: [place];  Service: Plastics;  Laterality: Bilateral;   CESAREAN SECTION     4 times   COLONOSCOPY  2020   Gupta-MAC-tics/hems/multiple polyps   POLYPECTOMY  2020   multiple polyps   TOTAL KNEE ARTHROPLASTY Left 05/03/2021   Procedure: LEFT TOTAL KNEE ARTHROPLASTY;  Surgeon: Leandrew Koyanagi, MD;  Location: [place];  Service: Orthopedics;  Laterality: Left;   WISDOM TOOTH EXTRACTION       There were no vitals filed for this visit.    Subjective Assessment - 05/20/21 1613     Subjective She relays Lt TKA on 05/03/21. She is doing well with pain overall less than 3/10 and dull ache noted and is now using [facility] for ambulation. Pain is aggravated by too much walking, pain is relieved with rest and meds    Pertinent History PMH - arthritis, depression, DM, HTN,osteopenia    Limitations Standing;Walking;House hold activities    Pain Frequency Intermittent                [facility] PT Assessment - 05/20/21 0001       Assessment   Medical Diagnosis Lt TKA 05/03/21    Referring Provider (PT) Nathaniel Man    Next MD Visit 06/15/21    Prior Therapy [facility]      Balance Screen   Has the patient fallen in the past 6 months No    Has the patient had a decrease in activity level because of a fear of falling?  No    Is the patient reluctant to leave their home because of a fear of falling?  No      Home Environment   Living Environment Private residence    Additional Comments no steps or stairs  Prior Function   Level of Independence Independent    Vocation Retired    Programmer, multimedia   Overall Cognitive Status Within Functional Limits for tasks assessed      Observation/Other Assessments   Focus on Therapeutic Outcomes (FOTO)  will do after visit or 2nd session as arrives late      ROM / Strength   AROM / PROM / Strength AROM;PROM;Strength      AROM   AROM Assessment Site Knee    Right/Left Knee Left    Left Knee Extension 10    Left Knee Flexion 95      PROM   PROM Assessment Site Knee    Right/Left Knee Left    Left Knee Extension 5    Left Knee Flexion 112      Strength   Overall Strength Comments Lt hip strength 4+ tested in sitting    Strength Assessment Site Knee    Right/Left Knee Left    Left Knee Flexion 4/5    Left Knee Extension 4/5      Ambulation/Gait   Ambulation/Gait Yes    Ambulation/Gait Assistance 6:  Modified independent (Device/Increase time)    Ambulation Distance (Feet) 100 Feet    Assistive device Straight cane                        Objective measurements completed on examination: See above findings.       [facility] PT Treatment/Exercise - 05/20/21 0001       Exercises   Exercises Knee/Hip      Knee/Hip Exercises: Stretches   Active Hamstring Stretch Left;2 reps;30 seconds    Active Hamstring Stretch Limitations supine with strap    Knee: Self-Stretch Limitations supine heelslides AAROM    Gastroc Stretch Left;2 reps;30 seconds    Gastroc Stretch Limitations supine with strap      Knee/Hip Exercises: Supine   Quad Sets Left;5 reps    Straight Leg Raises Left;5 reps      Modalities   Modalities Vasopneumatic      Vasopneumatic   Number Minutes Vasopneumatic  10 minutes    Vasopnuematic Location  Knee    Vasopneumatic Pressure Medium    Vasopneumatic Temperature  34      Manual Therapy   Manual therapy comments Lt knee PROM to tolerance, manual hamstring stretching, extnsion mobs gentle to tolerance                    PT Education - 05/20/21 1645     Education Details HEP,POC    Person(s) Educated Patient    Methods Explanation;Demonstration;Verbal cues;Handout    Comprehension Verbalized understanding;Returned demonstration;Need further instruction              PT Short Term Goals - 05/20/21 1659       PT SHORT TERM GOAL #1   Title Pt will be I and compliant with HEP    Time 4    Period Weeks    Status New    Target Date 06/17/21      PT SHORT TERM GOAL #2   Title Pt will improve Lt knee ROM 5-115    Time 4    Period Weeks    Status New      PT SHORT TERM GOAL #3   Title Pt will perform FOTO and we will write goal for predicted score    Time 4  Period Weeks    Status New               PT Long Term Goals - 05/20/21 1701       PT LONG TERM GOAL #1   Title Pt will improve FOTO score to predicted  functional outcome    Time 8    Period Weeks    Status New    Target Date 07/15/21      PT LONG TERM GOAL #2   Title Pt will improve Lt knee ROM 0-120 deg to improve function    Time 8    Period Weeks    Status New      PT LONG TERM GOAL #3   Title Pt will improve Lt hip/knee strength to 5/5 tested in sitting    Time 8    Period Weeks    Status New      PT LONG TERM GOAL #4   Title Pt will have less than 3/10 overall    Time 8    Period Weeks    Status New                    Plan - 05/20/21 1646     Clinical Impression Statement Pt presents with Lt TKA. She is overall ahead of the curve in terms of her ROM, strength, and pain, so she should do well with PT. She will benefit from skiled PT to address her functional deficits listed below.    Personal Factors and Comorbidities Comorbidity 3+    Comorbidities PMH - arthritis, depression, DM, HTN,osteopenia    Examination-Activity Limitations Bend;Locomotion Level;Sleep;Squat;Stand;Lift;Transfers    Examination-Participation Restrictions Cleaning;Community Activity;Driving;Laundry;Shop    Stability/Clinical Decision Making Stable/Uncomplicated    Rehab Potential Good    PT Frequency 2x / week    PT Duration 8 weeks    PT Treatment/Interventions ADLs/Self Care Home Management;Cryotherapy;Electrical Stimulation;Iontophoresis 60m/ml Dexamethasone;Moist Heat;Ultrasound;Gait training;Stair training;Therapeutic activities;Therapeutic exercise;Neuromuscular re-education;Manual techniques;Dry needling;Joint Manipulations;Spinal Manipulations;Taping;Passive range of motion;Vasopneumatic Device    PT Next Visit Plan do FOTO if she did not do it after session, knee ROM and strength to tolerance    PT Home Exercise Plan Access Code: [email or web address]   Consulted and Agree with Plan of Care Patient             Patient will benefit from skilled therapeutic intervention in order to improve the following deficits and impairments:   Decreased activity tolerance, Decreased balance, Decreased endurance, Decreased mobility, Decreased range of motion, Decreased strength, Increased edema, Difficulty walking, Pain  Visit Diagnosis: Acute pain of left knee  Muscle weakness (generalized)  Localized edema  Other abnormalities of gait and mobility  Stiffness of left knee, not elsewhere classified     Problem List Patient Active Problem List   Diagnosis Date Noted   Status post total left knee replacement 05/03/2021   Osteopenia 04/23/2021   Primary osteoarthritis of left knee 02/09/2021   Breast implant rupture 10/13/2020   Osteoarthritis of spine with radiculopathy, lumbar region 07/15/2020   Myalgia due to statin 02/24/2020   Reactive depression 11/06/2019   Colon polyp 05/29/2019   Controlled type 2 diabetes mellitus without complication, without long-term current use of insulin ([place] 03/22/2019   Patellofemoral pain syndrome of both knees 07/04/2018   Lower extremity edema 03/03/2017   Vitamin D deficiency 11/23/2015   Combined hyperlipidemia associated with type 2 diabetes mellitus ([place] 10/22/2015   Primary osteoarthritis of both knees 10/22/2015   Statin intolerance 10/22/2015  Essential hypertension 11/05/2008    Silvestre Mesi 05/20/2021, 5:08 PM  [facility] Physical Therapy [address] [place], [place], [address] Phone: [phone]   Fax:  [phone]  Name: Sarah Walsh MRN: [id number] Date of Birth: 05/21/1956  Referring diagnosis? Z96.65  Treatment diagnosis? (if different than referring diagnosis) m25.562 What was this (referring dx) caused by? _0  Surgery _1  Fall _2  Ongoing issue _3  Arthritis _4  Other: ____________  Laterality: _5  Rt _6  Lt _7  Both  Check all possible CPT codes:      _8  97110 (Therapeutic Exercise)  _9  92507 (SLP Treatment)  _10  46286 (Neuro Re-ed)   _11  38177 (Swallowing Treatment)   _12  11657 (Gait Training)   _13  90383  (Cognitive Training, 1st 15 minutes) _14  97140 (Manual Therapy)   _15  97130 (Cognitive Training, each add'l 15 minutes)  _16  97530 (Therapeutic Activities)  _17  Other, List CPT Code ____________    _18  33832 (Self Care)       _19  All codes above (97110 - 97535)  _20  97012 (Mechanical Traction)  _21  97014 (E-stim Unattended)  _22  97032 (E-stim manual)  _23  97033 (Ionto)  _24  97035 (Ultrasound)  _25  97760 (Orthotic Fit) _26  97750 (Physical Performance Training) _27  [id number] (Aquatic Therapy) _28  91916 (Contrast Bath) _29  60600 (Paraffin) _30  97597 (Wound Care 1st 20 sq cm) _31  97598 (Wound Care each add'l 20 sq cm) _32  97016 (Vasopneumatic Device) _33  45997 (Orthotic Training) _34  [id number] (Prosthetic Training)     PHYSICAL THERAPY DISCHARGE SUMMARY  Visits from Start of Care: 1  Current functional level related to goals / functional outcomes: See above   Remaining deficits: unknown   Education / Equipment: HEP   Patient agrees to discharge. Patient goals were not met. Patient is being discharged due to not returning since the last visit.    Laureen Abrahams, PT, DPT 08/09/21 1:01 PM  [facility] [address] [place], [place], [address] Phone: [phone]   Fax:  [phone]

## 2021-05-20 NOTE — Patient Instructions (Signed)
Access Code: [email or web address] URL: [email or web address] Date: 05/20/2021 Prepared by: Elsie Ra  Exercises Supine Hamstring Stretch with Strap - 2 x daily - 6 x weekly - 1 sets - 3 reps - 30 hold Supine Heel Slide with Strap - 2 x daily - 6 x weekly - 1-2 sets - 10 reps - 5 hold Supine Bridge - 2 x daily - 6 x weekly - 1-2 sets - 10 reps - 5 hold Quad Setting and Stretching - 2 x daily - 6 x weekly - 1-2 sets - 10 reps Active Straight Leg Raise with Quad Set - 2 x daily - 6 x weekly - 1-2 sets - 10 reps Seated Long Arc Quad - 2 x daily - 6 x weekly - 2-3 sets - 10 reps Standing Partial Squat - 2 x daily - 6 x weekly - 1-2 sets - 10 reps

## 2021-05-24 ENCOUNTER — Encounter: Payer: Medicare HMO | Admitting: Rehabilitative and Restorative Service Providers"

## 2021-06-01 ENCOUNTER — Telehealth: Payer: Self-pay | Admitting: Orthopaedic Surgery

## 2021-06-01 ENCOUNTER — Other Ambulatory Visit: Payer: Self-pay | Admitting: Physician Assistant

## 2021-06-01 ENCOUNTER — Other Ambulatory Visit: Payer: Self-pay | Admitting: Family Medicine

## 2021-06-01 MED ORDER — PREDNISONE 5 MG (21) PO TBPK: ORAL_TABLET | ORAL | 0 refills | Status: DC

## 2021-06-01 NOTE — Telephone Encounter (Signed)
Pt states that the sciatica is affecting her so much that she is not able to come to physical therapy and she wants to know what she can do? She is wondering if she can have some pain medication that will help her.  CB [phone]

## 2021-06-01 NOTE — Telephone Encounter (Signed)
Sent in a steroid taper.  Should probably come in to be seen as well

## 2021-06-02 ENCOUNTER — Encounter: Payer: Medicare HMO | Admitting: Rehabilitative and Restorative Service Providers"

## 2021-06-02 NOTE — Telephone Encounter (Signed)
Has Appt already made

## 2021-06-04 ENCOUNTER — Encounter: Payer: Medicare HMO | Admitting: Rehabilitative and Restorative Service Providers"

## 2021-06-08 ENCOUNTER — Encounter: Payer: Medicare HMO | Admitting: Physical Therapy

## 2021-06-11 ENCOUNTER — Encounter: Payer: Medicare HMO | Admitting: Physical Therapy

## 2021-06-15 ENCOUNTER — Encounter: Payer: Self-pay | Admitting: Orthopaedic Surgery

## 2021-06-15 ENCOUNTER — Other Ambulatory Visit: Payer: Self-pay

## 2021-06-15 ENCOUNTER — Ambulatory Visit (INDEPENDENT_AMBULATORY_CARE_PROVIDER_SITE_OTHER): Payer: Medicare HMO | Admitting: Orthopaedic Surgery

## 2021-06-15 ENCOUNTER — Ambulatory Visit (HOSPITAL_COMMUNITY)
Admission: RE | Admit: 2021-06-15 | Discharge: 2021-06-15 | Disposition: A | Discharge status: Home / Self Care | Payer: Medicare HMO | Source: Ambulatory Visit | Attending: Orthopaedic Surgery | Admitting: Orthopaedic Surgery

## 2021-06-15 ENCOUNTER — Telehealth: Payer: Self-pay | Admitting: Physical Therapy

## 2021-06-15 ENCOUNTER — Ambulatory Visit (INDEPENDENT_AMBULATORY_CARE_PROVIDER_SITE_OTHER): Payer: Medicare HMO

## 2021-06-15 ENCOUNTER — Encounter: Payer: Medicare HMO | Admitting: Physical Therapy

## 2021-06-15 ENCOUNTER — Ambulatory Visit: Payer: Self-pay

## 2021-06-15 DIAGNOSIS — M7989 Other specified soft tissue disorders: Secondary | ICD-10-CM | POA: Diagnosis not present

## 2021-06-15 DIAGNOSIS — M79662 Pain in left lower leg: Secondary | ICD-10-CM | POA: Insufficient documentation

## 2021-06-15 DIAGNOSIS — Z96652 Presence of left artificial knee joint: Secondary | ICD-10-CM

## 2021-06-15 NOTE — Progress Notes (Signed)
Patient was seen for possible Baker's cyst aspiration status post left knee replacement.  No definite Baker's cyst seen on limited diagnostic ultrasound.  She will proceed with Doppler studies this afternoon to rule out DVT.

## 2021-06-15 NOTE — Telephone Encounter (Signed)
She did not arrive to PT appointment today and has not attended since PT evaluation. Upon chart review I do see that she saw the MD today about possible bakers cyst who aspirated her knee and referred her to get U.S. doppler this afternoon to rule out DVT. I called and left message for her to call us back and let us know how she is doing and if she plans to atttend her next PT visit on 06/17/21 or if we need to cancel this.  Elsie Ra, PT, DPT 06/15/21 3:36 PM

## 2021-06-15 NOTE — Progress Notes (Signed)
Lower extremity venous has been completed.   Preliminary results in CV Proc.   Abram Sander 06/15/2021 3:58 PM

## 2021-06-15 NOTE — Progress Notes (Signed)
Post-Op Visit Note   Patient: Sarah Walsh           Date of Birth: 07/05/1956           MRN: [id number] Visit Date: 06/15/2021 PCP: Sueanne Margarita, DO   Assessment & Plan:  Chief Complaint:  Chief Complaint  Patient presents with   Left Knee - Pain   Visit Diagnoses:  1. H/O total knee replacement, left   2. Pain and swelling of left lower leg     Plan: [name] is 6 weeks status post left total knee replacement.  She has been to 1 outpatient physical therapy sessions so far.  She is ambulating with a cane.  She is reporting posterior knee pain.  Occasionally she has symptoms reminiscent of sciatica her main complaint is pain that is localized to the popliteal fossa.  She has been compliant with aspirin use.  Left knee shows fully healed surgical scar.  Range of motion is 5 to 105 degrees.  Stable to varus valgus.  She is quite tender to the popliteal fossa and it feels like a Baker's cyst.  No neurovascular compromise.  X-rays are unremarkable.  [name] is recovering well from her surgery but I do feel that she has a Baker's cyst that is causing her a good deal of the.  We had her see Dr. Junius Roads today to see if he can aspirate this to provide some relief.  I would like to also obtain a Doppler to rule out DVT.  Follow-up in 6 weeks.  We will be in touch with the patient regarding the results of the Doppler.  Follow-Up Instructions: Return in about 6 weeks (around 07/27/2021).   Orders:  Orders Placed This Encounter  Procedures   XR Knee 1-2 Views Left   VAS [place] LOWER EXTREMITY VENOUS (DVT)   No orders of the defined types were placed in this encounter.   Imaging: XR Knee 1-2 Views Left  Result Date: 06/15/2021 Stable total knee replacement in good alignment.    PMFS History: Patient Active Problem List   Diagnosis Date Noted   Status post total left knee replacement 05/03/2021   Osteopenia 04/23/2021   Primary osteoarthritis of left knee 02/09/2021   Breast implant  rupture 10/13/2020   Osteoarthritis of spine with radiculopathy, lumbar region 07/15/2020   Myalgia due to statin 02/24/2020   Reactive depression 11/06/2019   Colon polyp 05/29/2019   Controlled type 2 diabetes mellitus without complication, without long-term current use of insulin ([place]) 03/22/2019   Patellofemoral pain syndrome of both knees 07/04/2018   Lower extremity edema 03/03/2017   Vitamin D deficiency 11/23/2015   Combined hyperlipidemia associated with type 2 diabetes mellitus ([place]) 10/22/2015   Primary osteoarthritis of both knees 10/22/2015   Statin intolerance 10/22/2015   Essential hypertension 11/05/2008   Past Medical History:  Diagnosis Date   Anemia    Arthritis    bilateral knees   Colon polyp 05/29/2019   Depression    on meds-situational anxiety   Diabetes mellitus without complication ([place])    on meds   Hyperlipemia    on meds   Hypertension    hx of    Osteopenia 04/23/2021   DEXA 03/2021: lowest T = -2.0, osteeopenia. Recheck 2 years. Ca/D    Family History  Problem Relation Age of Onset   Alzheimer's disease Mother    Diabetes Daughter    Healthy Daughter    Diabetes Father    Colon polyps Father  Healthy Daughter    Healthy Daughter    Healthy Son    Esophageal cancer Neg Hx    Rectal cancer Neg Hx    Stomach cancer Neg Hx    Colon cancer Neg Hx     Past Surgical History:  Procedure Laterality Date   ABDOMINAL HYSTERECTOMY     AUGMENTATION MAMMAPLASTY Bilateral    36 years    BREAST IMPLANT REMOVAL Bilateral 12/10/2020   Procedure: REMOVAL BREAST IMPLANTS;  Surgeon: Wallace Going, DO;  Location: [place];  Service: Plastics;  Laterality: Bilateral;  2 hours please   CAPSULECTOMY Bilateral 12/10/2020   Procedure: CAPSULECTOMY;  Surgeon: Wallace Going, DO;  Location: [place];  Service: Plastics;  Laterality: Bilateral;   CESAREAN SECTION     4 times   COLONOSCOPY  2020    Gupta-MAC-tics/hems/multiple polyps   POLYPECTOMY  2020   multiple polyps   TOTAL KNEE ARTHROPLASTY Left 05/03/2021   Procedure: LEFT TOTAL KNEE ARTHROPLASTY;  Surgeon: Leandrew Koyanagi, MD;  Location: [place];  Service: Orthopedics;  Laterality: Left;   WISDOM TOOTH EXTRACTION     Social History   Occupational History   Occupation: Retired  Tobacco Use   Smoking status: Former    Years: 34.00    Types: Cigarettes    Quit date: 1985    Years since quitting: 37.6   Smokeless tobacco: Never  Vaping Use   Vaping Use: Never used  Substance and Sexual Activity   Alcohol use: No   Drug use: No   Sexual activity: Not on file

## 2021-06-17 ENCOUNTER — Encounter: Payer: Medicare HMO | Admitting: Physical Therapy

## 2021-06-25 ENCOUNTER — Telehealth: Payer: Self-pay | Admitting: Orthopaedic Surgery

## 2021-06-25 NOTE — Telephone Encounter (Signed)
Patient called. She would like a referral to PT elsewhere because she is not able to get the same time every week. I did explain once she schedules farther out it could be possible but at this time she would just have to take what is available. Her call back number is [phone]

## 2021-06-29 ENCOUNTER — Telehealth: Payer: Self-pay | Admitting: Orthopaedic Surgery

## 2021-06-29 DIAGNOSIS — Z96652 Presence of left artificial knee joint: Secondary | ICD-10-CM

## 2021-06-29 NOTE — Telephone Encounter (Signed)
No prefernce.  Wherever she wants to go, we can just give her a referral

## 2021-06-29 NOTE — Telephone Encounter (Signed)
PT is asking to go to different facility for physical therapy. Pt has cancelled and missed numerous pt appt at our facility. Pt states she wishes to go to another facility as soon as possible. Please call pt about this matter at [phone].

## 2021-06-29 NOTE — Telephone Encounter (Signed)
Do you have a preference for where she goes?

## 2021-06-30 NOTE — Telephone Encounter (Signed)
Sarah Walsh will not be back in office until next week. Can you add in or change the PT referral (placed today) to [place] location?

## 2021-06-30 NOTE — Telephone Encounter (Signed)
Not sure where pt will need to go due to insurance. Can you help with this when you return?

## 2021-07-07 ENCOUNTER — Other Ambulatory Visit: Payer: Self-pay | Admitting: Internal Medicine

## 2021-07-07 DIAGNOSIS — Z1231 Encounter for screening mammogram for malignant neoplasm of breast: Secondary | ICD-10-CM

## 2021-07-08 ENCOUNTER — Other Ambulatory Visit: Payer: Self-pay | Admitting: Family Medicine

## 2021-07-14 NOTE — Progress Notes (Deleted)
Office Visit Note  Patient: Sarah Walsh             Date of Birth: [birth year]           MRN: [id number]             PCP: Sueanne Margarita, DO Referring: No ref. provider found Visit Date: 07/28/2021 Occupation: _0 @  Subjective:  No chief complaint on file.   History of Present Illness: Sarah Walsh is a 65 y.o. female ***   Activities of Daily Living:  Patient reports morning stiffness for *** {minute/hour:19697}.   Patient {ACTIONS;DENIES/REPORTS:21021675::"Denies"} nocturnal pain.  Difficulty dressing/grooming: {ACTIONS;DENIES/REPORTS:21021675::"Denies"} Difficulty climbing stairs: {ACTIONS;DENIES/REPORTS:21021675::"Denies"} Difficulty getting out of chair: {ACTIONS;DENIES/REPORTS:21021675::"Denies"} Difficulty using hands for taps, buttons, cutlery, and/or writing: {ACTIONS;DENIES/REPORTS:21021675::"Denies"}  No Rheumatology ROS completed.   PMFS History:  Patient Active Problem List   Diagnosis Date Noted  . Status post total left knee replacement 05/03/2021  . Osteopenia 04/23/2021  . Primary osteoarthritis of left knee 02/09/2021  . Breast implant rupture 10/13/2020  . Osteoarthritis of spine with radiculopathy, lumbar region 07/15/2020  . Myalgia due to statin 02/24/2020  . Reactive depression 11/06/2019  . Colon polyp 05/29/2019  . Controlled type 2 diabetes mellitus without complication, without long-term current use of insulin ([place]) 03/22/2019  . Patellofemoral pain syndrome of both knees 07/04/2018  . Lower extremity edema 03/03/2017  . Vitamin D deficiency 11/23/2015  . Combined hyperlipidemia associated with type 2 diabetes mellitus ([place]) 10/22/2015  . Primary osteoarthritis of both knees 10/22/2015  . Statin intolerance 10/22/2015  . Essential hypertension 11/05/2008    Past Medical History:  Diagnosis Date  . Anemia   . Arthritis    bilateral knees  . Colon polyp 05/29/2019  . Depression    on meds-situational anxiety  . Diabetes mellitus  without complication ([place])    on meds  . Hyperlipemia    on meds  . Hypertension    hx of   . Osteopenia 04/23/2021   DEXA 03/2021: lowest T = -2.0, osteeopenia. Recheck 2 years. Ca/D    Family History  Problem Relation Age of Onset  . Alzheimer's disease Mother   . Diabetes Daughter   . Healthy Daughter   . Diabetes Father   . Colon polyps Father   . Healthy Daughter   . Healthy Daughter   . Healthy Son   . Esophageal cancer Neg Hx   . Rectal cancer Neg Hx   . Stomach cancer Neg Hx   . Colon cancer Neg Hx    Past Surgical History:  Procedure Laterality Date  . ABDOMINAL HYSTERECTOMY    . AUGMENTATION MAMMAPLASTY Bilateral    36 years   . BREAST IMPLANT REMOVAL Bilateral 12/10/2020   Procedure: REMOVAL BREAST IMPLANTS;  Surgeon: Wallace Going, DO;  Location: [facility];  Service: Plastics;  Laterality: Bilateral;  2 hours please  . CAPSULECTOMY Bilateral 12/10/2020   Procedure: CAPSULECTOMY;  Surgeon: Wallace Going, DO;  Location: [facility];  Service: Plastics;  Laterality: Bilateral;  . CESAREAN SECTION     4 times  . COLONOSCOPY  2020   Gupta-MAC-tics/hems/multiple polyps  . POLYPECTOMY  2020   multiple polyps  . TOTAL KNEE ARTHROPLASTY Left 05/03/2021   Procedure: LEFT TOTAL KNEE ARTHROPLASTY;  Surgeon: Leandrew Koyanagi, MD;  Location: [facility];  Service: Orthopedics;  Laterality: Left;  . WISDOM TOOTH EXTRACTION     Social History   Social History Narrative  . Not on  file   Immunization History  Administered Date(s) Administered  . Fluad Quad(high Dose 65+) 08/28/2019, 07/15/2020  . Influenza, Seasonal, Injecte, Preservative Fre 10/22/2015, 07/15/2016  . Influenza,inj,Quad PF,6+ Mos 09/29/2017, 07/04/2018  . PFIZER(Purple Top)SARS-COV-2 Vaccination 01/09/2020, 02/03/2020, 11/18/2020  . Pneumococcal Conjugate-13 03/27/2019  . Pneumococcal Polysaccharide-23 10/22/2015, 11/06/2019  . Tdap 11/22/2010  . Zoster Recombinat  (Shingrix) 05/18/2018  . Zoster, Live 11/23/2015     Objective: Vital Signs: There were no vitals taken for this visit.   Physical Exam   Musculoskeletal Exam: ***  CDAI Exam: CDAI Score: -- Patient Global: --; Provider Global: -- Swollen: --; Tender: -- Joint Exam 07/28/2021   No joint exam has been documented for this visit   There is currently no information documented on the homunculus. Go to the Rheumatology activity and complete the homunculus joint exam.  Investigation: No additional findings.  Imaging: US Guided Needle Placement - No Linked Charges  Result Date: 06/15/2021 Patient was seen for possible Baker's cyst aspiration status post left knee replacement.  No definite Baker's cyst seen on limited diagnostic ultrasound.  She will proceed with Doppler studies this afternoon to rule out DVT.  XR Knee 1-2 Views Left  Result Date: 06/15/2021 Stable total knee replacement in good alignment.   VAS [place] LOWER EXTREMITY VENOUS (DVT)  Result Date: 06/15/2021  Lower Venous DVT Study [name]  Date of Exam:   06/15/2021 Medical Rec #: [id number]      Accession #:    [id number] Date of Birth: 1956-08-08      Patient Gender: F Patient Age:   39 years Exam Location:  [facility] Procedure:      VAS [place] LOWER EXTREMITY VENOUS (DVT) Referring Phys: Georga Kaufmann XU --------------------------------------------------------------------------------  Indications: Pain.  Comparison Study: 05/17/20 prior Performing Technologist: Archie Patten RVS  Examination Guidelines: A complete evaluation includes B-mode imaging, spectral Doppler, color Doppler, and power Doppler as needed of all accessible portions of each vessel. Bilateral testing is considered an integral part of a complete examination. Limited examinations for reoccurring indications may be performed as noted. The reflux portion of the exam is performed with the patient in reverse Trendelenburg.   +-----+---------------+---------+-----------+----------+--------------+ RIGHTCompressibilityPhasicitySpontaneityPropertiesThrombus Aging +-----+---------------+---------+-----------+----------+--------------+ CFV  Full           Yes      Yes                                 +-----+---------------+---------+-----------+----------+--------------+   +---------+---------------+---------+-----------+----------+-------------------+ LEFT     CompressibilityPhasicitySpontaneityPropertiesThrombus Aging      +---------+---------------+---------+-----------+----------+-------------------+ CFV      Full           Yes      Yes                                      +---------+---------------+---------+-----------+----------+-------------------+ SFJ      Full                                                             +---------+---------------+---------+-----------+----------+-------------------+ FV Prox  Full                                                             +---------+---------------+---------+-----------+----------+-------------------+  FV Mid   Full                                                             +---------+---------------+---------+-----------+----------+-------------------+ FV DistalFull                                                             +---------+---------------+---------+-----------+----------+-------------------+ PFV      Full                                                             +---------+---------------+---------+-----------+----------+-------------------+ POP      Full           Yes      Yes                                      +---------+---------------+---------+-----------+----------+-------------------+ PTV      Full                                                             +---------+---------------+---------+-----------+----------+-------------------+ PERO                                                   Not well visualized +---------+---------------+---------+-----------+----------+-------------------+    Summary: RIGHT: - No evidence of common femoral vein obstruction.  LEFT: - There is no evidence of deep vein thrombosis in the lower extremity.  - No cystic structure found in the popliteal fossa.  *See table(s) above for measurements and observations. Electronically signed by Deitra Mayo MD on 06/15/2021 at 4:49:16 PM.    Final     Recent Labs: Lab Results  Component Value Date   WBC 11.1 (H) 05/04/2021   HGB 11.0 (L) 05/04/2021   PLT 229 05/04/2021   NA 132 (L) 05/04/2021   K 4.2 05/04/2021   CL 98 05/04/2021   CO2 27 05/04/2021   GLUCOSE 191 (H) 05/04/2021   BUN 6 (L) 05/04/2021   CREATININE 0.78 05/04/2021   BILITOT 0.3 04/21/2021   ALKPHOS 53 04/21/2021   AST 21 04/21/2021   ALT 25 04/21/2021   PROT 7.3 04/21/2021   ALBUMIN 3.8 04/21/2021   CALCIUM 8.4 (L) 05/04/2021   GFRAA >90 01/12/2012    Speciality Comments: No specialty comments available.  Procedures:  No procedures performed Allergies: Codeine and Statins   Assessment / Plan:     Visit Diagnoses: No diagnosis found.  Orders: No orders of the defined types were placed in this encounter.  No orders  of the defined types were placed in this encounter.   Face-to-face time spent with patient was *** minutes. Greater than 50% of time was spent in counseling and coordination of care.  Follow-Up Instructions: No follow-ups on file.   Earnestine Mealing, CMA  Note - This record has been created using Editor, commissioning.  Chart creation errors have been sought, but may not always  have been located. Such creation errors do not reflect on  the standard of medical care.

## 2021-07-28 ENCOUNTER — Ambulatory Visit: Payer: Medicare HMO | Admitting: Rheumatology

## 2021-07-28 DIAGNOSIS — Z789 Other specified health status: Secondary | ICD-10-CM

## 2021-07-28 DIAGNOSIS — M170 Bilateral primary osteoarthritis of knee: Secondary | ICD-10-CM

## 2021-07-28 DIAGNOSIS — E559 Vitamin D deficiency, unspecified: Secondary | ICD-10-CM

## 2021-07-28 DIAGNOSIS — T466X5A Adverse effect of antihyperlipidemic and antiarteriosclerotic drugs, initial encounter: Secondary | ICD-10-CM

## 2021-07-28 DIAGNOSIS — F329 Major depressive disorder, single episode, unspecified: Secondary | ICD-10-CM

## 2021-07-28 DIAGNOSIS — M79604 Pain in right leg: Secondary | ICD-10-CM

## 2021-07-28 DIAGNOSIS — E119 Type 2 diabetes mellitus without complications: Secondary | ICD-10-CM

## 2021-07-28 DIAGNOSIS — M222X1 Patellofemoral disorders, right knee: Secondary | ICD-10-CM

## 2021-07-28 DIAGNOSIS — M5136 Other intervertebral disc degeneration, lumbar region: Secondary | ICD-10-CM

## 2021-07-28 DIAGNOSIS — Z13820 Encounter for screening for osteoporosis: Secondary | ICD-10-CM

## 2021-07-28 DIAGNOSIS — M25462 Effusion, left knee: Secondary | ICD-10-CM

## 2021-07-28 DIAGNOSIS — E1169 Type 2 diabetes mellitus with other specified complication: Secondary | ICD-10-CM

## 2021-07-28 DIAGNOSIS — Z86010 Personal history of colonic polyps: Secondary | ICD-10-CM

## 2021-07-28 DIAGNOSIS — Z9886 Personal history of breast implant removal: Secondary | ICD-10-CM

## 2021-07-28 DIAGNOSIS — I10 Essential (primary) hypertension: Secondary | ICD-10-CM

## 2021-07-28 DIAGNOSIS — G8929 Other chronic pain: Secondary | ICD-10-CM

## 2021-08-23 ENCOUNTER — Other Ambulatory Visit: Payer: Self-pay

## 2021-08-23 ENCOUNTER — Ambulatory Visit
Admission: RE | Admit: 2021-08-23 | Discharge: 2021-08-23 | Disposition: A | Discharge status: Home / Self Care | Payer: Medicare HMO | Source: Ambulatory Visit | Attending: Internal Medicine | Admitting: Internal Medicine

## 2021-08-23 DIAGNOSIS — Z1231 Encounter for screening mammogram for malignant neoplasm of breast: Secondary | ICD-10-CM

## 2021-08-25 ENCOUNTER — Other Ambulatory Visit: Payer: Self-pay | Admitting: Internal Medicine

## 2021-08-25 DIAGNOSIS — R928 Other abnormal and inconclusive findings on diagnostic imaging of breast: Secondary | ICD-10-CM

## 2021-08-31 ENCOUNTER — Other Ambulatory Visit: Payer: Self-pay | Admitting: Family Medicine

## 2021-09-04 ENCOUNTER — Other Ambulatory Visit: Payer: Self-pay | Admitting: Family Medicine

## 2021-09-15 ENCOUNTER — Ambulatory Visit
Admission: RE | Admit: 2021-09-15 | Discharge: 2021-09-15 | Disposition: A | Discharge status: Home / Self Care | Payer: Medicare HMO | Source: Ambulatory Visit | Attending: Internal Medicine | Admitting: Internal Medicine

## 2021-09-15 ENCOUNTER — Other Ambulatory Visit: Payer: Self-pay | Admitting: Internal Medicine

## 2021-09-15 DIAGNOSIS — R928 Other abnormal and inconclusive findings on diagnostic imaging of breast: Secondary | ICD-10-CM

## 2021-09-21 ENCOUNTER — Other Ambulatory Visit: Payer: Self-pay | Admitting: Family Medicine

## 2021-12-21 DIAGNOSIS — L74519 Primary focal hyperhidrosis, unspecified: Secondary | ICD-10-CM | POA: Diagnosis not present

## 2021-12-22 DIAGNOSIS — I10 Essential (primary) hypertension: Secondary | ICD-10-CM | POA: Diagnosis not present

## 2021-12-22 DIAGNOSIS — M1990 Unspecified osteoarthritis, unspecified site: Secondary | ICD-10-CM | POA: Diagnosis not present

## 2021-12-22 DIAGNOSIS — M4800 Spinal stenosis, site unspecified: Secondary | ICD-10-CM | POA: Diagnosis not present

## 2021-12-22 DIAGNOSIS — E1169 Type 2 diabetes mellitus with other specified complication: Secondary | ICD-10-CM | POA: Diagnosis not present

## 2021-12-22 DIAGNOSIS — N3946 Mixed incontinence: Secondary | ICD-10-CM | POA: Diagnosis not present

## 2021-12-22 DIAGNOSIS — E669 Obesity, unspecified: Secondary | ICD-10-CM | POA: Diagnosis not present

## 2021-12-22 DIAGNOSIS — M609 Myositis, unspecified: Secondary | ICD-10-CM | POA: Diagnosis not present

## 2022-03-16 ENCOUNTER — Ambulatory Visit
Admission: RE | Admit: 2022-03-16 | Discharge: 2022-03-16 | Disposition: A | Discharge status: Home / Self Care | Payer: Medicare HMO | Source: Ambulatory Visit | Attending: Internal Medicine | Admitting: Internal Medicine

## 2022-03-16 ENCOUNTER — Other Ambulatory Visit: Payer: Self-pay | Admitting: Internal Medicine

## 2022-03-16 DIAGNOSIS — R928 Other abnormal and inconclusive findings on diagnostic imaging of breast: Secondary | ICD-10-CM

## 2022-03-16 DIAGNOSIS — R922 Inconclusive mammogram: Secondary | ICD-10-CM | POA: Diagnosis not present

## 2022-03-22 DIAGNOSIS — D225 Melanocytic nevi of trunk: Secondary | ICD-10-CM | POA: Diagnosis not present

## 2022-03-22 DIAGNOSIS — Z85820 Personal history of malignant melanoma of skin: Secondary | ICD-10-CM | POA: Diagnosis not present

## 2022-03-22 DIAGNOSIS — D2272 Melanocytic nevi of left lower limb, including hip: Secondary | ICD-10-CM | POA: Diagnosis not present

## 2022-03-22 DIAGNOSIS — L74511 Primary focal hyperhidrosis, face: Secondary | ICD-10-CM | POA: Diagnosis not present

## 2022-03-22 DIAGNOSIS — C4361 Malignant melanoma of right upper limb, including shoulder: Secondary | ICD-10-CM | POA: Diagnosis not present

## 2022-03-22 DIAGNOSIS — L821 Other seborrheic keratosis: Secondary | ICD-10-CM | POA: Diagnosis not present

## 2022-03-29 DIAGNOSIS — H2513 Age-related nuclear cataract, bilateral: Secondary | ICD-10-CM | POA: Diagnosis not present

## 2022-03-29 DIAGNOSIS — H04123 Dry eye syndrome of bilateral lacrimal glands: Secondary | ICD-10-CM | POA: Diagnosis not present

## 2022-04-15 DIAGNOSIS — C4361 Malignant melanoma of right upper limb, including shoulder: Secondary | ICD-10-CM | POA: Diagnosis not present

## 2022-04-15 DIAGNOSIS — D0361 Melanoma in situ of right upper limb, including shoulder: Secondary | ICD-10-CM | POA: Diagnosis not present

## 2022-04-15 DIAGNOSIS — D2261 Melanocytic nevi of right upper limb, including shoulder: Secondary | ICD-10-CM | POA: Diagnosis not present

## 2022-04-18 DIAGNOSIS — L03113 Cellulitis of right upper limb: Secondary | ICD-10-CM | POA: Diagnosis not present

## 2022-04-27 DIAGNOSIS — R202 Paresthesia of skin: Secondary | ICD-10-CM | POA: Diagnosis not present

## 2022-04-27 DIAGNOSIS — R200 Anesthesia of skin: Secondary | ICD-10-CM | POA: Diagnosis not present

## 2022-04-27 DIAGNOSIS — G5602 Carpal tunnel syndrome, left upper limb: Secondary | ICD-10-CM | POA: Diagnosis not present

## 2022-05-19 DIAGNOSIS — M1811 Unilateral primary osteoarthritis of first carpometacarpal joint, right hand: Secondary | ICD-10-CM | POA: Diagnosis not present

## 2022-05-19 DIAGNOSIS — M79642 Pain in left hand: Secondary | ICD-10-CM | POA: Diagnosis not present

## 2022-05-19 DIAGNOSIS — G5602 Carpal tunnel syndrome, left upper limb: Secondary | ICD-10-CM | POA: Diagnosis not present

## 2022-05-19 DIAGNOSIS — M19032 Primary osteoarthritis, left wrist: Secondary | ICD-10-CM | POA: Diagnosis not present

## 2022-06-23 DIAGNOSIS — D1801 Hemangioma of skin and subcutaneous tissue: Secondary | ICD-10-CM | POA: Diagnosis not present

## 2022-06-23 DIAGNOSIS — D2271 Melanocytic nevi of right lower limb, including hip: Secondary | ICD-10-CM | POA: Diagnosis not present

## 2022-06-23 DIAGNOSIS — D2372 Other benign neoplasm of skin of left lower limb, including hip: Secondary | ICD-10-CM | POA: Diagnosis not present

## 2022-06-23 DIAGNOSIS — D2272 Melanocytic nevi of left lower limb, including hip: Secondary | ICD-10-CM | POA: Diagnosis not present

## 2022-06-23 DIAGNOSIS — L820 Inflamed seborrheic keratosis: Secondary | ICD-10-CM | POA: Diagnosis not present

## 2022-06-23 DIAGNOSIS — D485 Neoplasm of uncertain behavior of skin: Secondary | ICD-10-CM | POA: Diagnosis not present

## 2022-06-23 DIAGNOSIS — D225 Melanocytic nevi of trunk: Secondary | ICD-10-CM | POA: Diagnosis not present

## 2022-06-23 DIAGNOSIS — L821 Other seborrheic keratosis: Secondary | ICD-10-CM | POA: Diagnosis not present

## 2022-06-23 DIAGNOSIS — L910 Hypertrophic scar: Secondary | ICD-10-CM | POA: Diagnosis not present

## 2022-06-23 DIAGNOSIS — D224 Melanocytic nevi of scalp and neck: Secondary | ICD-10-CM | POA: Diagnosis not present

## 2022-06-23 DIAGNOSIS — Z85820 Personal history of malignant melanoma of skin: Secondary | ICD-10-CM | POA: Diagnosis not present

## 2022-06-27 ENCOUNTER — Ambulatory Visit
Admission: EM | Admit: 2022-06-27 | Discharge: 2022-06-27 | Disposition: A | Discharge status: Home / Self Care | Payer: Medicare HMO | Attending: Emergency Medicine | Admitting: Emergency Medicine

## 2022-06-27 DIAGNOSIS — R21 Rash and other nonspecific skin eruption: Secondary | ICD-10-CM

## 2022-06-27 MED ORDER — TRIAMCINOLONE ACETONIDE 40 MG/ML IJ SUSP
40.0000 mg | Freq: Once | INTRAMUSCULAR | Status: AC
  Administered 2022-06-27: 40 mg via INTRAMUSCULAR

## 2022-06-27 MED ORDER — ACYCLOVIR 400 MG PO TABS: 400.0000 mg | ORAL_TABLET | Freq: Every day | ORAL | 0 refills | Status: AC

## 2022-06-27 MED ORDER — MONTELUKAST SODIUM 10 MG PO TABS: 10.0000 mg | ORAL_TABLET | Freq: Every day | ORAL | 1 refills | Status: DC

## 2022-06-27 MED ORDER — CETIRIZINE HCL 10 MG PO TABS: 10.0000 mg | ORAL_TABLET | Freq: Every day | ORAL | 0 refills | Status: DC

## 2022-06-27 NOTE — ED Provider Notes (Signed)
[facility]    CSN: [id number] Arrival date & time: 06/27/22  1623    HISTORY   Chief Complaint  Patient presents with   Pruritis   Rash   HPI Sarah Walsh is a pleasant, 66 y.o. female who presents to urgent care today. The pt c/o itching for a few weeks without rash, states she has been using a topical triamcinolone cream that her dermatologist had prescribed her previously.  Patient states that this morning she noticed a rash on her torso, lower back, arms today and has been accompanied by an intensified itching, states she feels like she wants to jump out of her skin.  Patient states she has continue using her triamcinolone cream and added calamine lotion with little relief.  Patient denies changing any of her laundry detergents, personal hygiene products, diet, household cleaners, denies lifestyle changes. The patient states she has not had any lifestyle changes.  Patient states she is never had a similar rash in the past.  Patient reports feeling otherwise well for the past few weeks.  Patient denies known sick contacts, new medications or supplements.  The history is provided by the patient.   Past Medical History:  Diagnosis Date   Anemia    Arthritis    bilateral knees   Colon polyp 05/29/2019   Depression    on meds-situational anxiety   Diabetes mellitus without complication ([place])    on meds   Hyperlipemia    on meds   Hypertension    hx of    Osteopenia 04/23/2021   DEXA 03/2021: lowest T = -2.0, osteeopenia. Recheck 2 years. Ca/D   Patient Active Problem List   Diagnosis Date Noted   Status post total left knee replacement 05/03/2021   Osteopenia 04/23/2021   Primary osteoarthritis of left knee 02/09/2021   Breast implant rupture 10/13/2020   Osteoarthritis of spine with radiculopathy, lumbar region 07/15/2020   Myalgia due to statin 02/24/2020   Reactive depression 11/06/2019   Colon polyp 05/29/2019   Controlled type 2 diabetes mellitus without  complication, without long-term current use of insulin ([place]) 03/22/2019   Patellofemoral pain syndrome of both knees 07/04/2018   Lower extremity edema 03/03/2017   Vitamin D deficiency 11/23/2015   Combined hyperlipidemia associated with type 2 diabetes mellitus ([place]) 10/22/2015   Primary osteoarthritis of both knees 10/22/2015   Statin intolerance 10/22/2015   Essential hypertension 11/05/2008   Past Surgical History:  Procedure Laterality Date   ABDOMINAL HYSTERECTOMY     BREAST IMPLANT REMOVAL Bilateral 12/10/2020   Procedure: REMOVAL BREAST IMPLANTS;  Surgeon: Wallace Going, DO;  Location: [facility];  Service: Plastics;  Laterality: Bilateral;  2 hours please   CAPSULECTOMY Bilateral 12/10/2020   Procedure: CAPSULECTOMY;  Surgeon: Wallace Going, DO;  Location: [facility];  Service: Plastics;  Laterality: Bilateral;   CESAREAN SECTION     4 times   COLONOSCOPY  2020   Gupta-MAC-tics/hems/multiple polyps   POLYPECTOMY  2020   multiple polyps   TOTAL KNEE ARTHROPLASTY Left 05/03/2021   Procedure: LEFT TOTAL KNEE ARTHROPLASTY;  Surgeon: Leandrew Koyanagi, MD;  Location: [facility];  Service: Orthopedics;  Laterality: Left;   WISDOM TOOTH EXTRACTION     OB History   No obstetric history on file.    Home Medications    Prior to Admission medications   Medication Sig Start Date End Date Taking? Authorizing Provider  acyclovir (ZOVIRAX) 400 MG tablet Take 1 tablet (  400 mg total) by mouth 5 (five) times daily for 7 days. 06/27/22 07/04/22 Yes Lynden Oxford Scales, PA-C  cetirizine (ZYRTEC ALLERGY) 10 MG tablet Take 1 tablet (10 mg total) by mouth at bedtime. 06/27/22 08/26/22 Yes Lynden Oxford Scales, PA-C  montelukast (SINGULAIR) 10 MG tablet Take 1 tablet (10 mg total) by mouth at bedtime. 06/27/22 08/26/22 Yes Lynden Oxford Scales, PA-C  oxyCODONE-acetaminophen (PERCOCET) 5-325 MG tablet Take 1-2 tablets by mouth every 6 (six) hours as needed.  05/14/21   Aundra Dubin, PA-C  predniSONE (STERAPRED UNI-PAK 21 TAB) 5 MG (21) TBPK tablet Take as directed 06/01/21   Aundra Dubin, PA-C  traMADol (ULTRAM) 50 MG tablet Take 1-2 tablets (50-100 mg total) by mouth every 6 (six) hours as needed. 05/04/21   Aundra Dubin, PA-C  Ascorbic Acid (VITAMIN C PO) Take 2,000 mg by mouth daily.    [provider]  aspirin EC 81 MG tablet Take 1 tablet (81 mg total) by mouth 2 (two) times daily. To be taken after surgery 04/28/21   Aundra Dubin, PA-C  B Complex Vitamins (B COMPLEX PO) Take 1 capsule by mouth daily.    [provider]  Cholecalciferol 25 MCG (1000 UT) tablet Take 1,000 Units by mouth 2 (two) times daily.    [provider]  diphenhydrAMINE (BENADRYL) 25 MG tablet Take 25 mg by mouth at bedtime.    [provider]  ezetimibe (ZETIA) 10 MG tablet Take 1 tablet by mouth once daily 06/02/21   Leamon Arnt, MD  FLUoxetine (PROZAC) 20 MG capsule Take 20 mg by mouth daily.    [provider]  gabapentin (NEURONTIN) 300 MG capsule Take 1 capsule by mouth at bedtime 06/02/21   Leamon Arnt, MD  hydrochlorothiazide (MICROZIDE) 12.5 MG capsule TAKE 1 CAPSULE BY MOUTH ONCE DAILY AS NEEDED 06/02/21   Leamon Arnt, MD  magnesium oxide (MAG-OX) 400 MG tablet Take 400 mg by mouth daily.    [provider]  metFORMIN (GLUCOPHAGE) 500 MG tablet Take 1 tablet (500 mg total) by mouth 2 (two) times daily with a meal. Schedule an appointment for further refills. 04/09/21   Leamon Arnt, MD  methocarbamol (ROBAXIN) 500 MG tablet Take 1 tablet (500 mg total) by mouth 2 (two) times daily as needed. To be taken after surgery 04/28/21   Aundra Dubin, PA-C  ondansetron (ZOFRAN) 4 MG tablet Take 1 tablet (4 mg total) by mouth every 8 (eight) hours as needed for nausea or vomiting. 04/28/21   Aundra Dubin, PA-C  Potassium 99 MG TABS Take 99 mg by mouth daily.    [provider]   sulfamethoxazole-trimethoprim (BACTRIM DS) 800-160 MG tablet Take 1 tablet by mouth 2 (two) times daily. To be taken after surgery 04/28/21   Aundra Dubin, PA-C    Family History Family History  Problem Relation Age of Onset   Alzheimer's disease Mother    Diabetes Daughter    Healthy Daughter    Diabetes Father    Colon polyps Father    Healthy Daughter    Healthy Daughter    Healthy Son    Esophageal cancer Neg Hx    Rectal cancer Neg Hx    Stomach cancer Neg Hx    Colon cancer Neg Hx    Social History Social History   Tobacco Use   Smoking status: Former    Years: 34.00    Types: Cigarettes  Quit date: 10    Years since quitting: 38.6   Smokeless tobacco: Never  Vaping Use   Vaping Use: Never used  Substance Use Topics   Alcohol use: No   Drug use: No   Allergies   Codeine and Statins  Review of Systems Review of Systems Pertinent findings revealed after performing a 14 point review of systems has been noted in the history of present illness.  Physical Exam Triage Vital Signs ED Triage Vitals  Enc Vitals Group     BP 08/20/21 0827 (!) 147/82     Pulse Rate 08/20/21 0827 72     Resp 08/20/21 0827 18     Temp 08/20/21 0827 98.3 F (36.8 C)     Temp Source 08/20/21 0827 Oral     SpO2 08/20/21 0827 98 %     Weight --      Height --      Head Circumference --      Peak Flow --      Pain Score 08/20/21 0826 5     Pain Loc --      Pain Edu? --      Excl. in [place]? --   No data found.  Updated Vital Signs BP 102/64 (BP Location: Right Arm)   Pulse 95   Temp 99.1 F (37.3 C) (Oral)   Resp 18   SpO2 92%   Physical Exam Vitals and nursing note reviewed.  Constitutional:      General: She is not in acute distress.    Appearance: Normal appearance.  HENT:     Head: Normocephalic and atraumatic.  Eyes:     Pupils: Pupils are equal, round, and reactive to light.  Cardiovascular:     Rate and Rhythm: Normal rate and regular rhythm.  Pulmonary:      Effort: Pulmonary effort is normal.     Breath sounds: Normal breath sounds.  Musculoskeletal:        General: Normal range of motion.     Cervical back: Normal range of motion and neck supple.  Skin:    General: Skin is warm and dry.     Findings: Rash (Please see photos below) present.  Neurological:     General: No focal deficit present.     Mental Status: She is alert and oriented to person, place, and time. Mental status is at baseline.  Psychiatric:        Mood and Affect: Mood normal.        Behavior: Behavior normal.        Thought Content: Thought content normal.        Judgment: Judgment normal.           Visual Acuity Right Eye Distance:   Left Eye Distance:   Bilateral Distance:    Right Eye Near:   Left Eye Near:    Bilateral Near:     UC Couse / Diagnostics / Procedures:     Radiology No results found.  Procedures Procedures (including critical care time) EKG  Pending results:  Labs Reviewed - No data to display  Medications Ordered in UC: Medications  triamcinolone acetonide (KENALOG-40) injection 40 mg (40 mg Intramuscular Given 06/27/22 1758)    UC Diagnoses / Final Clinical Impressions(s)   I have reviewed the triage vital signs and the nursing notes.  Pertinent labs & imaging results that were available during my care of the patient were reviewed by me and considered in my medical decision making (see  chart for details).    Final diagnoses:  Pityriasis rosea-like skin eruption   First photo in the series shows possible herald patch, we will treat this patient presumptively for pityriasis and continue topical steroids.  Have added Singulair and cetirizine to help calm skin and itching.  Patient advised to follow-up closely with her dermatologist.  ED Prescriptions     Medication Sig Dispense Auth. Provider   acyclovir (ZOVIRAX) 400 MG tablet Take 1 tablet (400 mg total) by mouth 5 (five) times daily for 7 days. 35 tablet Lynden Oxford Scales, PA-C   cetirizine (ZYRTEC ALLERGY) 10 MG tablet Take 1 tablet (10 mg total) by mouth at bedtime. 60 tablet Lynden Oxford Scales, PA-C   montelukast (SINGULAIR) 10 MG tablet Take 1 tablet (10 mg total) by mouth at bedtime. 30 tablet Lynden Oxford Scales, PA-C      PDMP not reviewed this encounter.  Pending results:  Labs Reviewed - No data to display  Discharge Instructions:   Discharge Instructions      I have provided you with some information about pityriasis rosea which I hope you find helpful.  As we discussed, I cannot guarantee you that that is what this rash is but I believe that if we pursue this diagnosis and treated as such, we will at least help get your severe itching under control and help you feel more comfortable.  You received an injection of Kenalog today which is not considered treatment for the skin condition but will significantly calm your skin and hopefully resolve your itching while these other medications that I recommend begin to take effect.    Please do continue using triamcinolone cream twice daily as recommended by your dermatologist.  I also recommend that you consider using a moisture barrier cream such as Eucerin original healing cream or something similar to keep your skin well-hydrated.  Please also be sure that you are drinking plenty of water.  I sent a prescription for acyclovir to your pharmacy.  The data of effectiveness for this medication with pityriasis rosea is not strong but there is reason to believe that it can be effective in resolving the underlying cause of the rash which may be viral.  Please take 1 tablet 5 times daily for the next 7 days.  Two more medications that I recommend to keep your skin calm include Zyrtec which is an antihistamine and Singulair which is a mast cell stabilizer.  These are commonly used to treat urticaria.  Urticaria is a blanket term used to describe itchiness, redness and inflammation of the  skin.  Because these medications can make you sleepy, I recommend that you take these at bedtime.  You may take your first dose of each tonight.  Thank you so much for visiting urgent care today and trusting [place] with your care.  I sincerely hope that we get you feeling better and hope even more that this resolves your issue completely.  If these recommendations do not provide you with resolution of this rash or your symptoms, please follow-up with your dermatologist or your primary care provider within the next 3 to 5 days for further evaluation and recommendations.        Disposition Upon Discharge:  Condition: stable for discharge home  Patient presented with an acute illness with associated systemic symptoms and significant discomfort requiring urgent management. In my opinion, this is a condition that a prudent lay person (someone who possesses an average knowledge of health and medicine) may potentially  expect to result in complications if not addressed urgently such as respiratory distress, impairment of bodily function or dysfunction of bodily organs.   Routine symptom specific, illness specific and/or disease specific instructions were discussed with the patient and/or caregiver at length.   As such, the patient has been evaluated and assessed, work-up was performed and treatment was provided in alignment with urgent care protocols and evidence based medicine.  Patient/parent/caregiver has been advised that the patient may require follow up for further testing and treatment if the symptoms continue in spite of treatment, as clinically indicated and appropriate.  Patient/parent/caregiver has been advised to return to the [facility] or PCP if no better; to PCP or the Emergency Department if new signs and symptoms develop, or if the current signs or symptoms continue to change or worsen for further workup, evaluation and treatment as clinically indicated and appropriate  The patient will follow up  with their current PCP if and as advised. If the patient does not currently have a PCP we will assist them in obtaining one.   The patient may need specialty follow up if the symptoms continue, in spite of conservative treatment and management, for further workup, evaluation, consultation and treatment as clinically indicated and appropriate.   Patient/parent/caregiver verbalized understanding and agreement of plan as discussed.  All questions were addressed during visit.  Please see discharge instructions below for further details of plan.  This office note has been dictated using Museum/gallery curator.  Unfortunately, this method of dictation can sometimes lead to typographical or grammatical errors.  I apologize for your inconvenience in advance if this occurs.  Please do not hesitate to reach out to me if clarification is needed.      Lynden Oxford Scales, PA-C 06/28/22 1321

## 2022-06-27 NOTE — ED Triage Notes (Signed)
The pt c/o itching for a few weeks. The patient states she has not had any lifestyle changes. The patient also c/o generalized rash that she noticed today.  Home interventions: OTC lotions, and calamine lotion

## 2022-06-27 NOTE — Discharge Instructions (Addendum)
I have provided you with some information about pityriasis rosea which I hope you find helpful.  As we discussed, I cannot guarantee you that that is what this rash is but I believe that if we pursue this diagnosis and treated as such, we will at least help get your severe itching under control and help you feel more comfortable.  You received an injection of Kenalog today which is not considered treatment for the skin condition but will significantly calm your skin and hopefully resolve your itching while these other medications that I recommend begin to take effect.    Please do continue using triamcinolone cream twice daily as recommended by your dermatologist.  I also recommend that you consider using a moisture barrier cream such as Eucerin original healing cream or something similar to keep your skin well-hydrated.  Please also be sure that you are drinking plenty of water.  I sent a prescription for acyclovir to your pharmacy.  The data of effectiveness for this medication with pityriasis rosea is not strong but there is reason to believe that it can be effective in resolving the underlying cause of the rash which may be viral.  Please take 1 tablet 5 times daily for the next 7 days.  Two more medications that I recommend to keep your skin calm include Zyrtec which is an antihistamine and Singulair which is a mast cell stabilizer.  These are commonly used to treat urticaria.  Urticaria is a blanket term used to describe itchiness, redness and inflammation of the skin.  Because these medications can make you sleepy, I recommend that you take these at bedtime.  You may take your first dose of each tonight.  Thank you so much for visiting urgent care today and trusting [place] with your care.  I sincerely hope that we get you feeling better and hope even more that this resolves your issue completely.  If these recommendations do not provide you with resolution of this rash or your symptoms, please follow-up  with your dermatologist or your primary care provider within the next 3 to 5 days for further evaluation and recommendations.

## 2022-06-29 DIAGNOSIS — G5612 Other lesions of median nerve, left upper limb: Secondary | ICD-10-CM | POA: Diagnosis not present

## 2022-06-30 DIAGNOSIS — I10 Essential (primary) hypertension: Secondary | ICD-10-CM | POA: Diagnosis not present

## 2022-06-30 DIAGNOSIS — D485 Neoplasm of uncertain behavior of skin: Secondary | ICD-10-CM | POA: Diagnosis not present

## 2022-06-30 DIAGNOSIS — C4361 Malignant melanoma of right upper limb, including shoulder: Secondary | ICD-10-CM | POA: Diagnosis not present

## 2022-06-30 DIAGNOSIS — R590 Localized enlarged lymph nodes: Secondary | ICD-10-CM | POA: Diagnosis not present

## 2022-06-30 DIAGNOSIS — E119 Type 2 diabetes mellitus without complications: Secondary | ICD-10-CM | POA: Diagnosis not present

## 2022-06-30 DIAGNOSIS — E785 Hyperlipidemia, unspecified: Secondary | ICD-10-CM | POA: Diagnosis not present

## 2022-06-30 DIAGNOSIS — Z87891 Personal history of nicotine dependence: Secondary | ICD-10-CM | POA: Diagnosis not present

## 2022-06-30 DIAGNOSIS — Z6829 Body mass index (BMI) 29.0-29.9, adult: Secondary | ICD-10-CM | POA: Diagnosis not present

## 2022-07-04 DIAGNOSIS — G5602 Carpal tunnel syndrome, left upper limb: Secondary | ICD-10-CM | POA: Diagnosis not present

## 2022-07-08 DIAGNOSIS — R590 Localized enlarged lymph nodes: Secondary | ICD-10-CM | POA: Diagnosis not present

## 2022-07-08 DIAGNOSIS — C4361 Malignant melanoma of right upper limb, including shoulder: Secondary | ICD-10-CM | POA: Diagnosis not present

## 2022-07-12 DIAGNOSIS — D485 Neoplasm of uncertain behavior of skin: Secondary | ICD-10-CM | POA: Diagnosis not present

## 2022-07-12 DIAGNOSIS — L988 Other specified disorders of the skin and subcutaneous tissue: Secondary | ICD-10-CM | POA: Diagnosis not present

## 2022-07-12 IMAGING — MG MM DIGITAL DIAGNOSTIC UNILAT*L* W/ TOMO W/ CAD
4 series · 4 of 12 positions shown · non-contrast
Comparison: Previous exams including recent screening mammogram
dated 08/23/2021.

CLINICAL DATA: Patient returns today to evaluate a possible LEFT
breast asymmetry questioned on recent screening mammogram. Status
post implant removal.

EXAM:
DIGITAL DIAGNOSTIC UNILATERAL LEFT MAMMOGRAM WITH TOMOSYNTHESIS AND
CAD; ULTRASOUND LEFT BREAST LIMITED
TECHNIQUE: Left digital diagnostic mammography and breast tomosynthesis was
performed. The images were evaluated with computer-aided detection.;
Targeted ultrasound examination of the left breast was performed.

[L ML synth-2D]
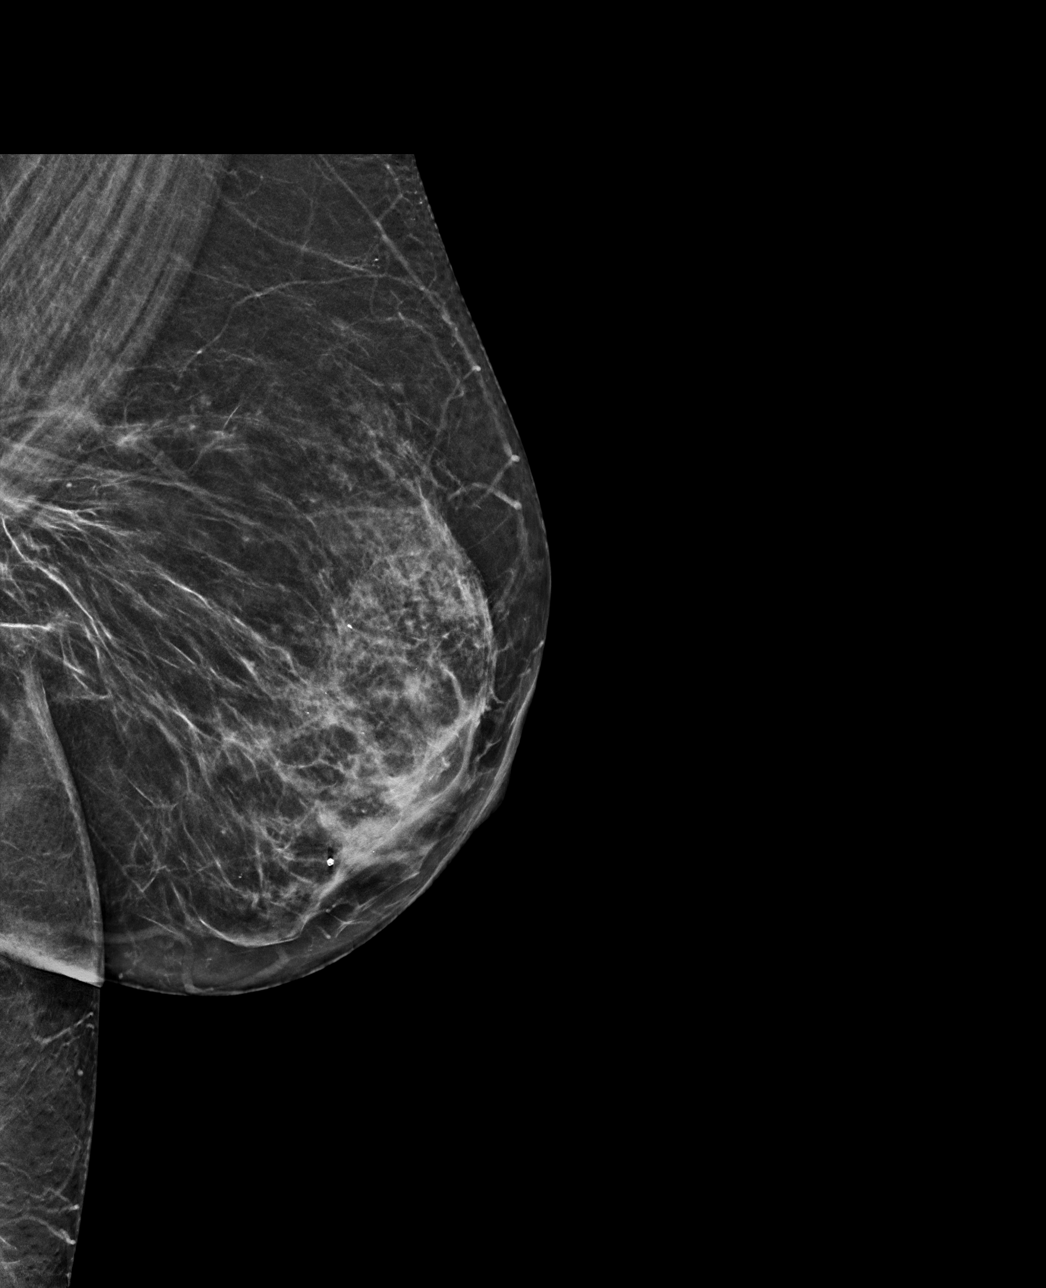

[L CC synth-2D]
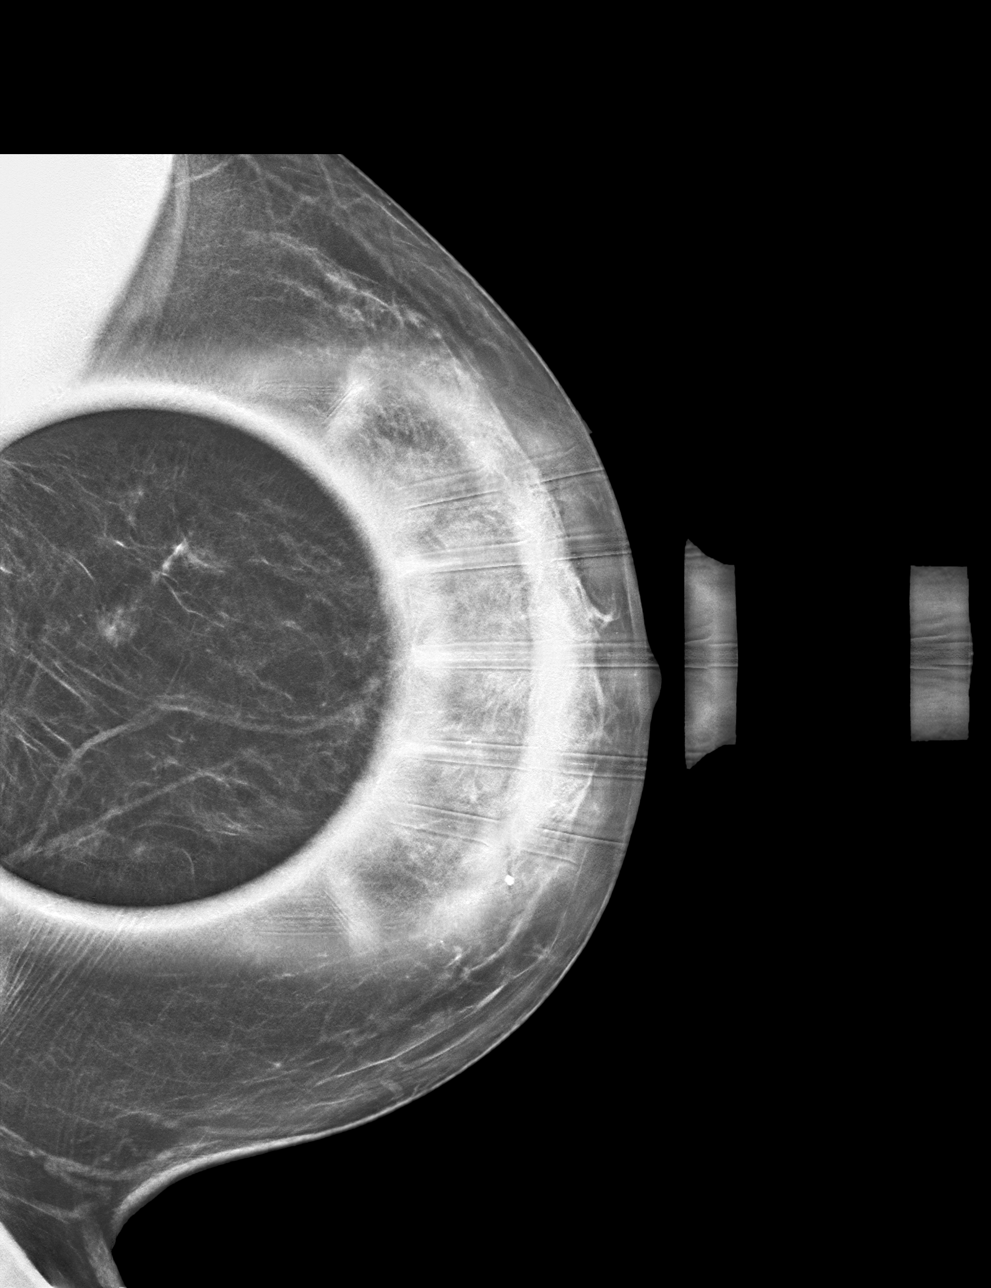

[L ML tomo · tomo slice 30/59.0]
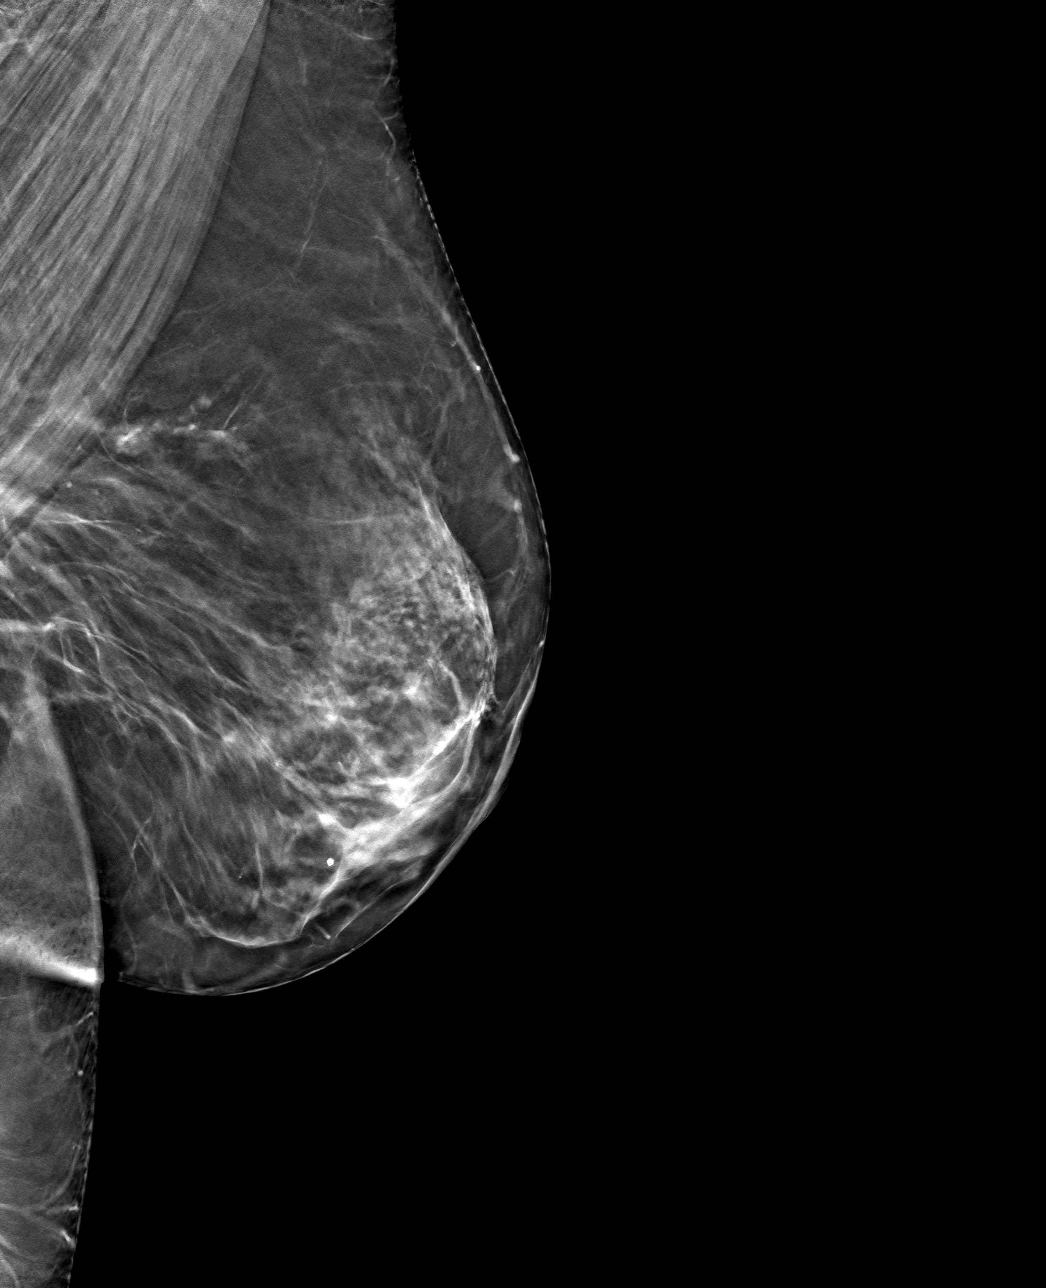

[L CC tomo · tomo slice 25/50.0]
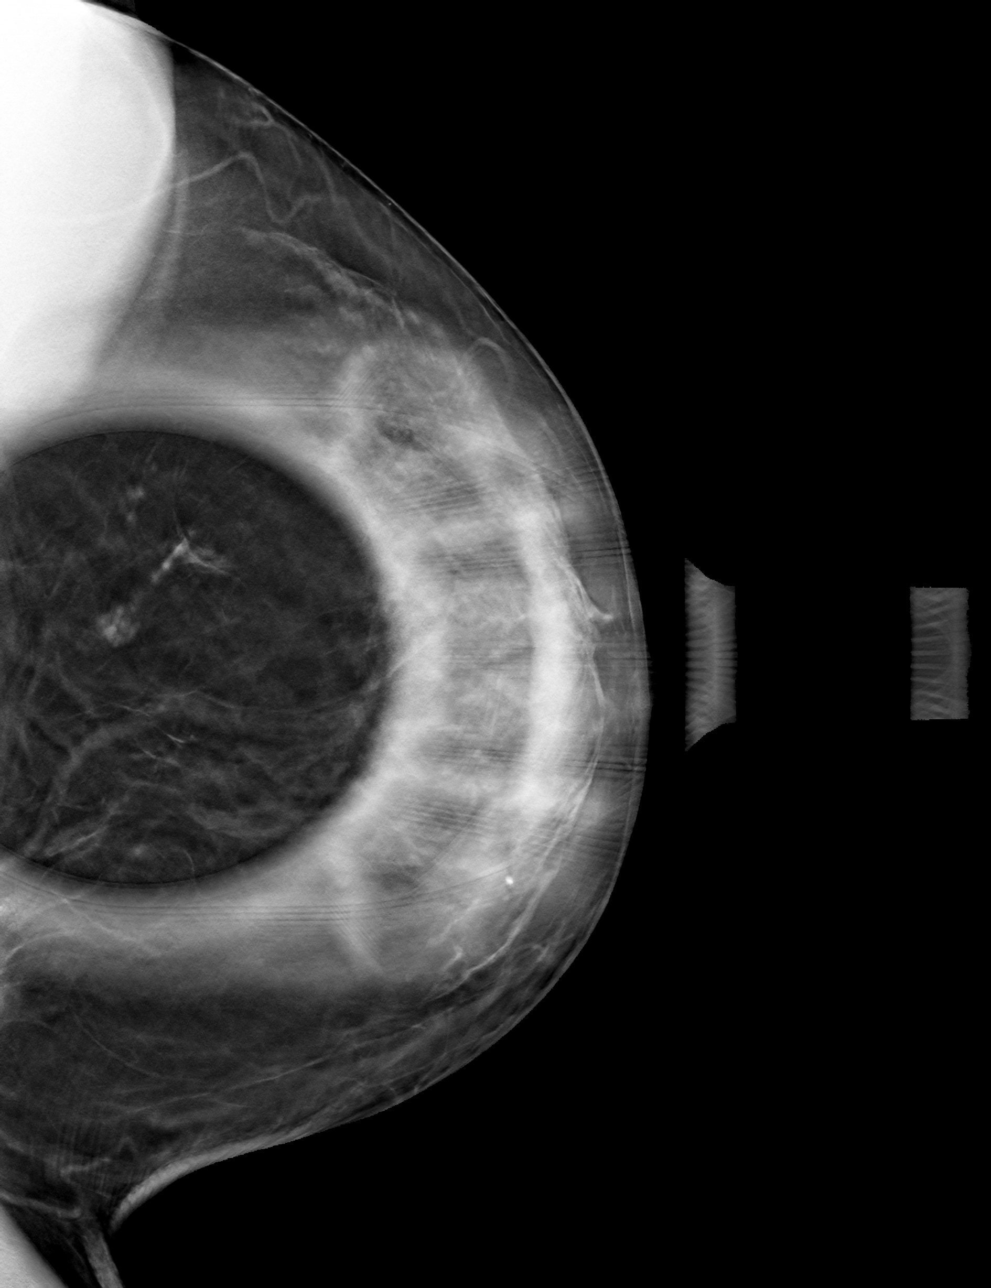

[4 of 12 positions shown; findings below may reference images not displayed]

ACR Breast Density Category c: The breast tissue is heterogeneously
dense, which may obscure small masses.
FINDINGS: On today's additional diagnostic views, the questioned asymmetry
persists within the upper retroareolar LEFT breast, at posterior
depth, most likely an island of normal fibroglandular tissue or
postsurgical scarring related to interval implant removal.

Targeted ultrasound is performed, evaluating the upper to
retroareolar LEFT breast, showing only normal fibroglandular tissues
and fat lobules throughout. No solid or cystic mass.
IMPRESSION: Probably benign asymmetry within the upper retroareolar LEFT breast,
at posterior depth, most likely an island of normal fibroglandular
tissue or scarring status post interval implant removal. Recommend
follow-up LEFT breast diagnostic mammogram in 6 months to ensure
stability of the today's benign appearance.

RECOMMENDATION:
LEFT breast diagnostic mammogram in 6 months.

I have discussed the findings and recommendations with the patient.
If applicable, a reminder letter will be sent to the patient
regarding the next appointment.

BI-RADS CATEGORY  3: Probably benign.

## 2022-07-12 IMAGING — US US BREAST*L* LIMITED INC AXILLA
1 series · 2 of 2 positions shown · non-contrast
Comparison: Previous exams including recent screening mammogram
dated 08/23/2021.

CLINICAL DATA: Patient returns today to evaluate a possible LEFT
breast asymmetry questioned on recent screening mammogram. Status
post implant removal.

EXAM:
DIGITAL DIAGNOSTIC UNILATERAL LEFT MAMMOGRAM WITH TOMOSYNTHESIS AND
CAD; ULTRASOUND LEFT BREAST LIMITED
TECHNIQUE: Left digital diagnostic mammography and breast tomosynthesis was
performed. The images were evaluated with computer-aided detection.;
Targeted ultrasound examination of the left breast was performed.

[Series 1: us breast*left* limited inc axilla · 0.07mm/px · 2 of 2 slices shown]
[im 1/2]
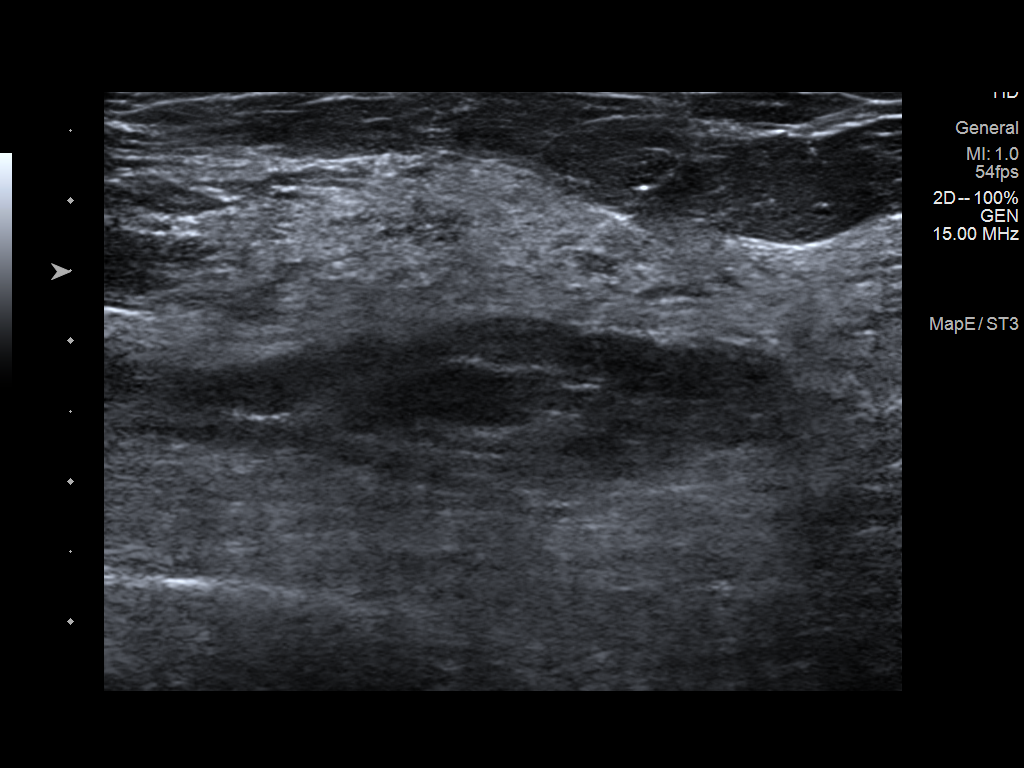
[im 2/2]
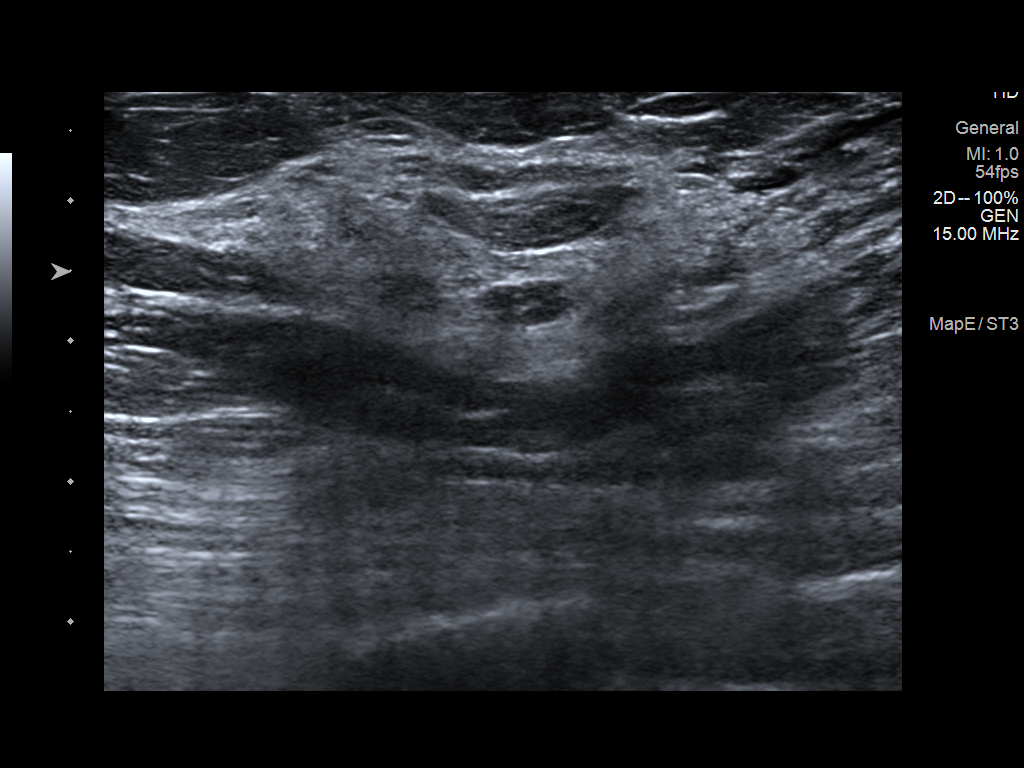

[2 of 2 positions shown; findings below may reference images not displayed]

ACR Breast Density Category c: The breast tissue is heterogeneously
dense, which may obscure small masses.
FINDINGS: On today's additional diagnostic views, the questioned asymmetry
persists within the upper retroareolar LEFT breast, at posterior
depth, most likely an island of normal fibroglandular tissue or
postsurgical scarring related to interval implant removal.

Targeted ultrasound is performed, evaluating the upper to
retroareolar LEFT breast, showing only normal fibroglandular tissues
and fat lobules throughout. No solid or cystic mass.
IMPRESSION: Probably benign asymmetry within the upper retroareolar LEFT breast,
at posterior depth, most likely an island of normal fibroglandular
tissue or scarring status post interval implant removal. Recommend
follow-up LEFT breast diagnostic mammogram in 6 months to ensure
stability of the today's benign appearance.

RECOMMENDATION:
LEFT breast diagnostic mammogram in 6 months.

I have discussed the findings and recommendations with the patient.
If applicable, a reminder letter will be sent to the patient
regarding the next appointment.

BI-RADS CATEGORY  3: Probably benign.

## 2022-07-21 DIAGNOSIS — C969 Malignant neoplasm of lymphoid, hematopoietic and related tissue, unspecified: Secondary | ICD-10-CM | POA: Diagnosis not present

## 2022-07-21 DIAGNOSIS — R591 Generalized enlarged lymph nodes: Secondary | ICD-10-CM | POA: Diagnosis not present

## 2022-07-21 DIAGNOSIS — Z85820 Personal history of malignant melanoma of skin: Secondary | ICD-10-CM | POA: Diagnosis not present

## 2022-07-21 DIAGNOSIS — C8590 Non-Hodgkin lymphoma, unspecified, unspecified site: Secondary | ICD-10-CM | POA: Diagnosis not present

## 2022-07-21 DIAGNOSIS — C439 Malignant melanoma of skin, unspecified: Secondary | ICD-10-CM | POA: Diagnosis not present

## 2022-07-21 DIAGNOSIS — R590 Localized enlarged lymph nodes: Secondary | ICD-10-CM | POA: Diagnosis not present

## 2022-08-02 ENCOUNTER — Observation Stay (HOSPITAL_COMMUNITY): Payer: Medicare HMO

## 2022-08-02 ENCOUNTER — Inpatient Hospital Stay (HOSPITAL_COMMUNITY)
Admission: EM | Admit: 2022-08-02 | Discharge: 2022-08-18 | DRG: 064 | Disposition: A | Discharge status: Hospice | Payer: Medicare HMO | Attending: Family Medicine | Admitting: Family Medicine

## 2022-08-02 ENCOUNTER — Other Ambulatory Visit: Payer: Self-pay

## 2022-08-02 ENCOUNTER — Emergency Department (HOSPITAL_COMMUNITY): Payer: Medicare HMO

## 2022-08-02 ENCOUNTER — Encounter (HOSPITAL_COMMUNITY): Payer: Self-pay

## 2022-08-02 DIAGNOSIS — I63423 Cerebral infarction due to embolism of bilateral anterior cerebral arteries: Secondary | ICD-10-CM | POA: Diagnosis present

## 2022-08-02 DIAGNOSIS — I10 Essential (primary) hypertension: Secondary | ICD-10-CM | POA: Diagnosis present

## 2022-08-02 DIAGNOSIS — Z820 Family history of epilepsy and other diseases of the nervous system: Secondary | ICD-10-CM

## 2022-08-02 DIAGNOSIS — G9340 Encephalopathy, unspecified: Principal | ICD-10-CM

## 2022-08-02 DIAGNOSIS — I081 Rheumatic disorders of both mitral and tricuspid valves: Secondary | ICD-10-CM | POA: Diagnosis not present

## 2022-08-02 DIAGNOSIS — C8519 Unspecified B-cell lymphoma, extranodal and solid organ sites: Secondary | ICD-10-CM | POA: Diagnosis present

## 2022-08-02 DIAGNOSIS — R29703 NIHSS score 3: Secondary | ICD-10-CM | POA: Diagnosis present

## 2022-08-02 DIAGNOSIS — J918 Pleural effusion in other conditions classified elsewhere: Secondary | ICD-10-CM | POA: Diagnosis not present

## 2022-08-02 DIAGNOSIS — R531 Weakness: Secondary | ICD-10-CM | POA: Diagnosis not present

## 2022-08-02 DIAGNOSIS — Z7189 Other specified counseling: Secondary | ICD-10-CM | POA: Diagnosis not present

## 2022-08-02 DIAGNOSIS — J90 Pleural effusion, not elsewhere classified: Secondary | ICD-10-CM | POA: Diagnosis not present

## 2022-08-02 DIAGNOSIS — J9691 Respiratory failure, unspecified with hypoxia: Secondary | ICD-10-CM | POA: Diagnosis not present

## 2022-08-02 DIAGNOSIS — Z7984 Long term (current) use of oral hypoglycemic drugs: Secondary | ICD-10-CM | POA: Diagnosis not present

## 2022-08-02 DIAGNOSIS — C8590 Non-Hodgkin lymphoma, unspecified, unspecified site: Secondary | ICD-10-CM | POA: Diagnosis not present

## 2022-08-02 DIAGNOSIS — E119 Type 2 diabetes mellitus without complications: Secondary | ICD-10-CM | POA: Diagnosis present

## 2022-08-02 DIAGNOSIS — Z66 Do not resuscitate: Secondary | ICD-10-CM | POA: Diagnosis not present

## 2022-08-02 DIAGNOSIS — J9601 Acute respiratory failure with hypoxia: Secondary | ICD-10-CM | POA: Diagnosis not present

## 2022-08-02 DIAGNOSIS — Z4682 Encounter for fitting and adjustment of non-vascular catheter: Secondary | ICD-10-CM | POA: Diagnosis not present

## 2022-08-02 DIAGNOSIS — R6510 Systemic inflammatory response syndrome (SIRS) of non-infectious origin without acute organ dysfunction: Secondary | ICD-10-CM | POA: Insufficient documentation

## 2022-08-02 DIAGNOSIS — R0902 Hypoxemia: Secondary | ICD-10-CM | POA: Diagnosis not present

## 2022-08-02 DIAGNOSIS — E872 Acidosis, unspecified: Secondary | ICD-10-CM | POA: Diagnosis present

## 2022-08-02 DIAGNOSIS — R590 Localized enlarged lymph nodes: Secondary | ICD-10-CM | POA: Diagnosis not present

## 2022-08-02 DIAGNOSIS — R4701 Aphasia: Secondary | ICD-10-CM | POA: Diagnosis present

## 2022-08-02 DIAGNOSIS — Q2112 Patent foramen ovale: Secondary | ICD-10-CM

## 2022-08-02 DIAGNOSIS — Z7952 Long term (current) use of systemic steroids: Secondary | ICD-10-CM

## 2022-08-02 DIAGNOSIS — Z87891 Personal history of nicotine dependence: Secondary | ICD-10-CM | POA: Diagnosis not present

## 2022-08-02 DIAGNOSIS — E876 Hypokalemia: Secondary | ICD-10-CM | POA: Diagnosis present

## 2022-08-02 DIAGNOSIS — J811 Chronic pulmonary edema: Secondary | ICD-10-CM | POA: Diagnosis not present

## 2022-08-02 DIAGNOSIS — E440 Moderate protein-calorie malnutrition: Secondary | ICD-10-CM | POA: Diagnosis present

## 2022-08-02 DIAGNOSIS — R791 Abnormal coagulation profile: Secondary | ICD-10-CM | POA: Diagnosis not present

## 2022-08-02 DIAGNOSIS — Z515 Encounter for palliative care: Secondary | ICD-10-CM | POA: Diagnosis not present

## 2022-08-02 DIAGNOSIS — Z96652 Presence of left artificial knee joint: Secondary | ICD-10-CM | POA: Diagnosis present

## 2022-08-02 DIAGNOSIS — R4182 Altered mental status, unspecified: Secondary | ICD-10-CM | POA: Diagnosis present

## 2022-08-02 DIAGNOSIS — Z0389 Encounter for observation for other suspected diseases and conditions ruled out: Secondary | ICD-10-CM | POA: Diagnosis not present

## 2022-08-02 DIAGNOSIS — Z1152 Encounter for screening for COVID-19: Secondary | ICD-10-CM | POA: Diagnosis not present

## 2022-08-02 DIAGNOSIS — Z79899 Other long term (current) drug therapy: Secondary | ICD-10-CM

## 2022-08-02 DIAGNOSIS — J189 Pneumonia, unspecified organism: Secondary | ICD-10-CM | POA: Diagnosis not present

## 2022-08-02 DIAGNOSIS — E785 Hyperlipidemia, unspecified: Secondary | ICD-10-CM | POA: Diagnosis present

## 2022-08-02 DIAGNOSIS — G9341 Metabolic encephalopathy: Secondary | ICD-10-CM | POA: Diagnosis present

## 2022-08-02 DIAGNOSIS — Z85820 Personal history of malignant melanoma of skin: Secondary | ICD-10-CM

## 2022-08-02 DIAGNOSIS — I639 Cerebral infarction, unspecified: Secondary | ICD-10-CM | POA: Diagnosis not present

## 2022-08-02 DIAGNOSIS — I63413 Cerebral infarction due to embolism of bilateral middle cerebral arteries: Principal | ICD-10-CM | POA: Diagnosis present

## 2022-08-02 DIAGNOSIS — N390 Urinary tract infection, site not specified: Secondary | ICD-10-CM | POA: Diagnosis not present

## 2022-08-02 DIAGNOSIS — I6389 Other cerebral infarction: Secondary | ICD-10-CM | POA: Diagnosis not present

## 2022-08-02 DIAGNOSIS — Z7982 Long term (current) use of aspirin: Secondary | ICD-10-CM

## 2022-08-02 DIAGNOSIS — A419 Sepsis, unspecified organism: Secondary | ICD-10-CM | POA: Diagnosis present

## 2022-08-02 DIAGNOSIS — Z792 Long term (current) use of antibiotics: Secondary | ICD-10-CM

## 2022-08-02 DIAGNOSIS — R9431 Abnormal electrocardiogram [ECG] [EKG]: Secondary | ICD-10-CM | POA: Diagnosis not present

## 2022-08-02 DIAGNOSIS — Z6828 Body mass index (BMI) 28.0-28.9, adult: Secondary | ICD-10-CM | POA: Diagnosis not present

## 2022-08-02 DIAGNOSIS — R918 Other nonspecific abnormal finding of lung field: Secondary | ICD-10-CM | POA: Diagnosis not present

## 2022-08-02 DIAGNOSIS — R059 Cough, unspecified: Secondary | ICD-10-CM | POA: Diagnosis not present

## 2022-08-02 DIAGNOSIS — I63433 Cerebral infarction due to embolism of bilateral posterior cerebral arteries: Secondary | ICD-10-CM | POA: Diagnosis present

## 2022-08-02 DIAGNOSIS — E871 Hypo-osmolality and hyponatremia: Secondary | ICD-10-CM | POA: Diagnosis present

## 2022-08-02 DIAGNOSIS — Z888 Allergy status to other drugs, medicaments and biological substances status: Secondary | ICD-10-CM

## 2022-08-02 DIAGNOSIS — L299 Pruritus, unspecified: Secondary | ICD-10-CM | POA: Diagnosis present

## 2022-08-02 DIAGNOSIS — I82452 Acute embolism and thrombosis of left peroneal vein: Secondary | ICD-10-CM | POA: Diagnosis present

## 2022-08-02 DIAGNOSIS — I82401 Acute embolism and thrombosis of unspecified deep veins of right lower extremity: Secondary | ICD-10-CM | POA: Diagnosis not present

## 2022-08-02 DIAGNOSIS — I471 Supraventricular tachycardia, unspecified: Secondary | ICD-10-CM | POA: Diagnosis not present

## 2022-08-02 DIAGNOSIS — R911 Solitary pulmonary nodule: Secondary | ICD-10-CM | POA: Diagnosis not present

## 2022-08-02 DIAGNOSIS — R7989 Other specified abnormal findings of blood chemistry: Secondary | ICD-10-CM | POA: Diagnosis not present

## 2022-08-02 DIAGNOSIS — J9690 Respiratory failure, unspecified, unspecified whether with hypoxia or hypercapnia: Secondary | ICD-10-CM | POA: Diagnosis not present

## 2022-08-02 DIAGNOSIS — Z83719 Family history of colon polyps, unspecified: Secondary | ICD-10-CM

## 2022-08-02 DIAGNOSIS — E8770 Fluid overload, unspecified: Secondary | ICD-10-CM | POA: Diagnosis not present

## 2022-08-02 DIAGNOSIS — C8448 Peripheral T-cell lymphoma, not classified, lymph nodes of multiple sites: Secondary | ICD-10-CM | POA: Diagnosis not present

## 2022-08-02 DIAGNOSIS — R1310 Dysphagia, unspecified: Secondary | ICD-10-CM | POA: Diagnosis present

## 2022-08-02 DIAGNOSIS — Z7401 Bed confinement status: Secondary | ICD-10-CM | POA: Diagnosis not present

## 2022-08-02 DIAGNOSIS — T368X5A Adverse effect of other systemic antibiotics, initial encounter: Secondary | ICD-10-CM | POA: Diagnosis present

## 2022-08-02 DIAGNOSIS — I824Z2 Acute embolism and thrombosis of unspecified deep veins of left distal lower extremity: Secondary | ICD-10-CM | POA: Diagnosis not present

## 2022-08-02 DIAGNOSIS — J9811 Atelectasis: Secondary | ICD-10-CM | POA: Diagnosis not present

## 2022-08-02 DIAGNOSIS — M1711 Unilateral primary osteoarthritis, right knee: Secondary | ICD-10-CM | POA: Diagnosis present

## 2022-08-02 DIAGNOSIS — I82451 Acute embolism and thrombosis of right peroneal vein: Secondary | ICD-10-CM | POA: Diagnosis not present

## 2022-08-02 DIAGNOSIS — A4102 Sepsis due to Methicillin resistant Staphylococcus aureus: Secondary | ICD-10-CM | POA: Diagnosis not present

## 2022-08-02 DIAGNOSIS — G4733 Obstructive sleep apnea (adult) (pediatric): Secondary | ICD-10-CM | POA: Diagnosis present

## 2022-08-02 DIAGNOSIS — Z885 Allergy status to narcotic agent status: Secondary | ICD-10-CM

## 2022-08-02 DIAGNOSIS — I82409 Acute embolism and thrombosis of unspecified deep veins of unspecified lower extremity: Secondary | ICD-10-CM | POA: Diagnosis present

## 2022-08-02 DIAGNOSIS — I340 Nonrheumatic mitral (valve) insufficiency: Secondary | ICD-10-CM | POA: Diagnosis not present

## 2022-08-02 DIAGNOSIS — Z833 Family history of diabetes mellitus: Secondary | ICD-10-CM

## 2022-08-02 DIAGNOSIS — C8580 Other specified types of non-Hodgkin lymphoma, unspecified site: Secondary | ICD-10-CM | POA: Diagnosis present

## 2022-08-02 DIAGNOSIS — M8580 Other specified disorders of bone density and structure, unspecified site: Secondary | ICD-10-CM | POA: Diagnosis present

## 2022-08-02 LAB — COMPREHENSIVE METABOLIC PANEL
ALT: 14 U/L (ref 0–44)
AST: 22 U/L (ref 15–41)
Albumin: 3.2 g/dL — ABNORMAL LOW (ref 3.5–5.0)
Alkaline Phosphatase: 118 U/L (ref 38–126)
Anion gap: 12 (ref 5–15)
BUN: 11 mg/dL (ref 8–23)
CO2: 23 mmol/L (ref 22–32)
Calcium: 9.2 mg/dL (ref 8.9–10.3)
Chloride: 99 mmol/L (ref 98–111)
Creatinine, Ser: 0.75 mg/dL (ref 0.44–1.00)
GFR, Estimated: >60 mL/min (ref >60)
Glucose, Bld: 148 mg/dL — ABNORMAL HIGH (ref 70–99)
Potassium: 3.4 mmol/L — ABNORMAL LOW (ref 3.5–5.1)
Sodium: 134 mmol/L — ABNORMAL LOW (ref 135–145)
Total Bilirubin: 0.9 mg/dL (ref 0.3–1.2)
Total Protein: 7.5 g/dL (ref 6.5–8.1)

## 2022-08-02 LAB — CBC WITH DIFFERENTIAL/PLATELET
Abs Immature Granulocytes: 0.13 10*3/uL — ABNORMAL HIGH (ref 0.00–0.07)
Basophils Absolute: 0.2 10*3/uL — ABNORMAL HIGH (ref 0.0–0.1)
Basophils Relative: 0 %
Eosinophils Absolute: 45.9 10*3/uL — ABNORMAL HIGH (ref 0.0–0.5)
Eosinophils Relative: 85 %
HCT: 39.7 % (ref 36.0–46.0)
Hemoglobin: 13.6 g/dL (ref 12.0–15.0)
Immature Granulocytes: 0 %
Lymphocytes Relative: 3 %
Lymphs Abs: 1.7 10*3/uL (ref 0.7–4.0)
MCH: 32.3 pg (ref 26.0–34.0)
MCHC: 34.3 g/dL (ref 30.0–36.0)
MCV: 94.3 fL (ref 80.0–100.0)
Monocytes Absolute: 0.8 10*3/uL (ref 0.1–1.0)
Monocytes Relative: 1 %
Neutro Abs: 5.8 10*3/uL (ref 1.7–7.7)
Neutrophils Relative %: 11 %
Platelets: 296 10*3/uL (ref 150–400)
RBC: 4.21 MIL/uL (ref 3.87–5.11)
RDW: 15.9 % — ABNORMAL HIGH (ref 11.5–15.5)
WBC: 54.5 10*3/uL (ref 4.0–10.5)
nRBC: 0 % (ref 0.0–0.2)

## 2022-08-02 LAB — RESP PANEL BY RT-PCR (FLU A&B, COVID) ARPGX2
Influenza A by PCR: NEGATIVE
Influenza B by PCR: NEGATIVE
SARS Coronavirus 2 by RT PCR: NEGATIVE

## 2022-08-02 LAB — CBG MONITORING, ED: Glucose-Capillary: 149 mg/dL — ABNORMAL HIGH (ref 70–99)

## 2022-08-02 LAB — VITAMIN B12: Vitamin B-12: 1416 pg/mL — ABNORMAL HIGH (ref 180–914)

## 2022-08-02 LAB — LACTIC ACID, PLASMA
Lactic Acid, Venous: 1.7 mmol/L (ref 0.5–1.9)
Lactic Acid, Venous: 2.1 mmol/L (ref 0.5–1.9)
Lactic Acid, Venous: 2 mmol/L (ref 0.5–1.9)

## 2022-08-02 LAB — URINALYSIS, ROUTINE W REFLEX MICROSCOPIC
Bilirubin Urine: NEGATIVE
Glucose, UA: NEGATIVE mg/dL
Hgb urine dipstick: NEGATIVE
Ketones, ur: 5 mg/dL — AB
Nitrite: NEGATIVE
Protein, ur: NEGATIVE mg/dL
Specific Gravity, Urine: 1.012 (ref 1.005–1.030)
pH: 5 (ref 5.0–8.0)

## 2022-08-02 LAB — APTT: aPTT: 28 seconds (ref 24–36)

## 2022-08-02 LAB — DIC (DISSEMINATED INTRAVASCULAR COAGULATION)PANEL
D-Dimer, Quant: 1.11 ug/mL-FEU — ABNORMAL HIGH (ref 0.00–0.50)
Fibrinogen: 358 mg/dL (ref 210–475)
INR: 1.3 — ABNORMAL HIGH (ref 0.8–1.2)
Platelets: 278 10*3/uL (ref 150–400)
Prothrombin Time: 15.8 seconds — ABNORMAL HIGH (ref 11.4–15.2)
Smear Review: NONE SEEN
aPTT: 23 seconds — ABNORMAL LOW (ref 24–36)

## 2022-08-02 LAB — AMMONIA: Ammonia: 29 umol/L (ref 9–35)

## 2022-08-02 LAB — CBC
HCT: 39.3 % (ref 36.0–46.0)
Hemoglobin: 13.6 g/dL (ref 12.0–15.0)
MCH: 32.6 pg (ref 26.0–34.0)
MCHC: 34.6 g/dL (ref 30.0–36.0)
MCV: 94.2 fL (ref 80.0–100.0)
Platelets: 284 10*3/uL (ref 150–400)
RBC: 4.17 MIL/uL (ref 3.87–5.11)
RDW: 15.8 % — ABNORMAL HIGH (ref 11.5–15.5)
WBC: 54.9 10*3/uL (ref 4.0–10.5)
nRBC: 0 % (ref 0.0–0.2)

## 2022-08-02 LAB — PHOSPHORUS: Phosphorus: 2.6 mg/dL (ref 2.5–4.6)

## 2022-08-02 LAB — LACTATE DEHYDROGENASE: LDH: 448 U/L — ABNORMAL HIGH (ref 98–192)

## 2022-08-02 LAB — URIC ACID: Uric Acid, Serum: 5.4 mg/dL (ref 2.5–7.1)

## 2022-08-02 LAB — PROTIME-INR
INR: 1.2 (ref 0.8–1.2)
Prothrombin Time: 15.5 seconds — ABNORMAL HIGH (ref 11.4–15.2)

## 2022-08-02 LAB — TSH: TSH: 2.447 u[IU]/mL (ref 0.350–4.500)

## 2022-08-02 MED ORDER — METRONIDAZOLE 500 MG/100ML IV SOLN
500.0000 mg | Freq: Once | INTRAVENOUS | Status: AC
  Administered 2022-08-02: 500 mg via INTRAVENOUS
  Filled 2022-08-02: qty 100

## 2022-08-02 MED ORDER — POTASSIUM CHLORIDE CRYS ER 20 MEQ PO TBCR
20.0000 meq | EXTENDED_RELEASE_TABLET | Freq: Once | ORAL | Status: AC
  Administered 2022-08-02: 20 meq via ORAL
  Filled 2022-08-02: qty 1

## 2022-08-02 MED ORDER — HYDRALAZINE HCL 25 MG PO TABS: 25.0000 mg | ORAL_TABLET | Freq: Four times a day (QID) | ORAL | Status: DC | PRN

## 2022-08-02 MED ORDER — LACTATED RINGERS IV BOLUS
1000.0000 mL | Freq: Once | INTRAVENOUS | Status: AC
  Administered 2022-08-02: 1000 mL via INTRAVENOUS

## 2022-08-02 MED ORDER — LACTATED RINGERS IV SOLN: INTRAVENOUS | Status: DC

## 2022-08-02 MED ORDER — SODIUM CHLORIDE 0.9 % IV SOLN
2.0000 g | Freq: Three times a day (TID) | INTRAVENOUS | Status: DC
  Administered 2022-08-03 – 2022-08-05 (×7): 2 g via INTRAVENOUS
  Filled 2022-08-02 (×7): qty 12.5

## 2022-08-02 MED ORDER — LACTATED RINGERS IV SOLN: INTRAVENOUS | Status: AC

## 2022-08-02 MED ORDER — ONDANSETRON HCL 4 MG PO TABS: 4.0000 mg | ORAL_TABLET | Freq: Four times a day (QID) | ORAL | Status: DC | PRN

## 2022-08-02 MED ORDER — ONDANSETRON HCL 4 MG/2ML IJ SOLN: 4.0000 mg | Freq: Four times a day (QID) | INTRAMUSCULAR | Status: DC | PRN

## 2022-08-02 MED ORDER — ACETAMINOPHEN 325 MG PO TABS
650.0000 mg | ORAL_TABLET | Freq: Once | ORAL | Status: AC
  Administered 2022-08-02: 650 mg via ORAL
  Filled 2022-08-02: qty 2

## 2022-08-02 MED ORDER — VANCOMYCIN HCL IN DEXTROSE 1-5 GM/200ML-% IV SOLN: 1000.0000 mg | Freq: Once | INTRAVENOUS | Status: DC

## 2022-08-02 MED ORDER — LORAZEPAM 2 MG/ML IJ SOLN
1.0000 mg | Freq: Once | INTRAMUSCULAR | Status: AC | PRN
  Administered 2022-08-02: 1 mg via INTRAVENOUS
  Filled 2022-08-02: qty 1

## 2022-08-02 MED ORDER — METRONIDAZOLE 500 MG/100ML IV SOLN
500.0000 mg | Freq: Two times a day (BID) | INTRAVENOUS | Status: DC
  Administered 2022-08-03 – 2022-08-08 (×11): 500 mg via INTRAVENOUS
  Filled 2022-08-02 (×12): qty 100

## 2022-08-02 MED ORDER — ACETAMINOPHEN 325 MG PO TABS
650.0000 mg | ORAL_TABLET | Freq: Four times a day (QID) | ORAL | Status: DC | PRN
  Administered 2022-08-03: 650 mg via ORAL
  Filled 2022-08-02: qty 2

## 2022-08-02 MED ORDER — ACETAMINOPHEN 650 MG RE SUPP
650.0000 mg | Freq: Four times a day (QID) | RECTAL | Status: DC | PRN
  Administered 2022-08-05 – 2022-08-06 (×2): 650 mg via RECTAL
  Filled 2022-08-02 (×2): qty 1

## 2022-08-02 MED ORDER — DIPHENHYDRAMINE HCL 50 MG/ML IJ SOLN
25.0000 mg | Freq: Four times a day (QID) | INTRAMUSCULAR | Status: DC | PRN
  Administered 2022-08-02 – 2022-08-03 (×2): 25 mg via INTRAVENOUS
  Filled 2022-08-02 (×2): qty 1

## 2022-08-02 MED ORDER — VANCOMYCIN HCL 1500 MG/300ML IV SOLN
1500.0000 mg | Freq: Once | INTRAVENOUS | Status: AC
  Administered 2022-08-02: 1500 mg via INTRAVENOUS
  Filled 2022-08-02: qty 300

## 2022-08-02 MED ORDER — VANCOMYCIN HCL 750 MG/150ML IV SOLN
750.0000 mg | Freq: Two times a day (BID) | INTRAVENOUS | Status: DC
  Administered 2022-08-03 – 2022-08-05 (×6): 750 mg via INTRAVENOUS
  Filled 2022-08-02 (×8): qty 150

## 2022-08-02 MED ORDER — SODIUM CHLORIDE 0.9% FLUSH
3.0000 mL | Freq: Two times a day (BID) | INTRAVENOUS | Status: DC
  Administered 2022-08-02 – 2022-08-18 (×26): 3 mL via INTRAVENOUS

## 2022-08-02 MED ORDER — SODIUM CHLORIDE 0.9 % IV SOLN: 2.0000 g | Freq: Once | INTRAVENOUS | Status: DC

## 2022-08-02 MED ORDER — SENNOSIDES-DOCUSATE SODIUM 8.6-50 MG PO TABS: 1.0000 | ORAL_TABLET | Freq: Every evening | ORAL | Status: DC | PRN

## 2022-08-02 MED ORDER — ENOXAPARIN SODIUM 40 MG/0.4ML IJ SOSY
40.0000 mg | PREFILLED_SYRINGE | INTRAMUSCULAR | Status: DC
  Filled 2022-08-02: qty 0.4

## 2022-08-02 NOTE — ED Provider Triage Note (Signed)
Emergency Medicine Provider Triage Evaluation Note  Sarah Walsh , a 66 y.o. female  was evaluated in triage.  Pt complains of confused, lethargic, sleeping more than usual for the past few days, here with daughter. Not aware of fever PTA.  No known sick contacts.  Complains of feeling itchy, recently diagnosed with lymphoma.  Review of Systems  Positive: fatigue Negative: Dysuria, frequency  Physical Exam  BP (!) 152/86 (BP Location: Right Arm)   Pulse (!) 107   Temp (!) 100.8 F (38.2 C) (Oral)   Resp (!) 24   Ht '5\' 3"'$  (1.6 m)   Wt 72.6 kg   SpO2 90%   BMI 28.34 kg/m  Gen:   Awake, no distress   Resp:  Normal effort  MSK:   Moves extremities without difficulty  Other:    Medical Decision Making  Medically screening exam initiated at 3:06 PM.  Appropriate orders placed.  [name] was informed that the remainder of the evaluation will be completed by another provider, this initial triage assessment does not replace that evaluation, and the importance of remaining in the ED until their evaluation is complete.     Tacy Learn, PA-C 08/02/22 1507

## 2022-08-02 NOTE — ED Notes (Signed)
WBC 54.9 called as critical PA notified

## 2022-08-02 NOTE — ED Notes (Signed)
Date and time results received: 08/02/22 2016 (use smartphrase ".now" to insert current time)  Test: lactic Critical Value: 2.1  Name of Provider Notified: Doren Custard  Orders Received? Or Actions Taken?:  notified MD

## 2022-08-02 NOTE — ED Triage Notes (Signed)
Recently dx with lymphoma and has been lethargic confused and not eating and drinking.  Patient told family a couple weaks ago she thought she was getting a UTI but denies any dsyuria today.  Patient slow to answer and appears weak.  Reports cough due to nodules in lungs.

## 2022-08-02 NOTE — ED Provider Notes (Signed)
[facility] EMERGENCY DEPARTMENT Provider Note   CSN: [id number] Arrival date & time: 08/02/22  1416     History  Chief Complaint  Patient presents with   Altered Mental Status    Sarah Walsh is a 66 y.o. female.   Altered Mental Status Presenting symptoms: confusion   Associated symptoms: rash   Patient presents for altered mental status.  Medical history includes HTN, HLD, arthritis, T2DM, anemia.  She has a remote history of melanoma.  She was recently seen for right cervical adenopathy.  She underwent lymph node excision.  Pathology report was suggestive of lymphoma.  She is scheduled to follow-up at [facility] with heme-onc.  Over the past 4 days, she has had noticeable confusion at home.  Patient's daughter reports that she will wander into rooms with no reason.  She has appeared off balance at times.  She has not been eating or drinking.  1 week ago, she felt like she was getting a UTI.  She has had ongoing diffuse pruritus for the past several weeks.  Currently, patient endorses pruritus but denies any other current symptoms.     Home Medications Prior to Admission medications   Medication Sig Start Date End Date Taking? Authorizing Provider  oxyCODONE-acetaminophen (PERCOCET) 5-325 MG tablet Take 1-2 tablets by mouth every 6 (six) hours as needed. 05/14/21   Aundra Dubin, PA-C  predniSONE (STERAPRED UNI-PAK 21 TAB) 5 MG (21) TBPK tablet Take as directed 06/01/21   Aundra Dubin, PA-C  traMADol (ULTRAM) 50 MG tablet Take 1-2 tablets (50-100 mg total) by mouth every 6 (six) hours as needed. 05/04/21   Aundra Dubin, PA-C  Ascorbic Acid (VITAMIN C PO) Take 2,000 mg by mouth daily.    [provider]  aspirin EC 81 MG tablet Take 1 tablet (81 mg total) by mouth 2 (two) times daily. To be taken after surgery 04/28/21   Aundra Dubin, PA-C  B Complex Vitamins (B COMPLEX PO) Take 1 capsule by mouth daily.    [provider]  cetirizine  (ZYRTEC ALLERGY) 10 MG tablet Take 1 tablet (10 mg total) by mouth at bedtime. 06/27/22 08/26/22  Lynden Oxford Scales, PA-C  Cholecalciferol 25 MCG (1000 UT) tablet Take 1,000 Units by mouth 2 (two) times daily.    [provider]  diphenhydrAMINE (BENADRYL) 25 MG tablet Take 25 mg by mouth at bedtime.    [provider]  ezetimibe (ZETIA) 10 MG tablet Take 1 tablet by mouth once daily 06/02/21   Leamon Arnt, MD  FLUoxetine (PROZAC) 20 MG capsule Take 20 mg by mouth daily.    [provider]  gabapentin (NEURONTIN) 300 MG capsule Take 1 capsule by mouth at bedtime 06/02/21   Leamon Arnt, MD  hydrochlorothiazide (MICROZIDE) 12.5 MG capsule TAKE 1 CAPSULE BY MOUTH ONCE DAILY AS NEEDED 06/02/21   Leamon Arnt, MD  magnesium oxide (MAG-OX) 400 MG tablet Take 400 mg by mouth daily.    [provider]  metFORMIN (GLUCOPHAGE) 500 MG tablet Take 1 tablet (500 mg total) by mouth 2 (two) times daily with a meal. Schedule an appointment for further refills. 04/09/21   Leamon Arnt, MD  methocarbamol (ROBAXIN) 500 MG tablet Take 1 tablet (500 mg total) by mouth 2 (two) times daily as needed. To be taken after surgery 04/28/21   Aundra Dubin, PA-C  montelukast (SINGULAIR) 10 MG tablet Take 1 tablet (10 mg total) by mouth at  bedtime. 06/27/22 08/26/22  Lynden Oxford Scales, PA-C  ondansetron (ZOFRAN) 4 MG tablet Take 1 tablet (4 mg total) by mouth every 8 (eight) hours as needed for nausea or vomiting. 04/28/21   Aundra Dubin, PA-C  Potassium 99 MG TABS Take 99 mg by mouth daily.    [provider]  sulfamethoxazole-trimethoprim (BACTRIM DS) 800-160 MG tablet Take 1 tablet by mouth 2 (two) times daily. To be taken after surgery 04/28/21   Aundra Dubin, PA-C      Allergies    Statins and Codeine    Review of Systems   Review of Systems  Constitutional:  Positive for appetite change and fatigue.  Genitourinary:  Positive for dysuria.  Skin:  Positive  for rash.  Psychiatric/Behavioral:  Positive for confusion.   All other systems reviewed and are negative.   Physical Exam Updated Vital Signs BP 126/86   Pulse 97   Temp 98.3 F (36.8 C) (Oral)   Resp 16   Ht '5\' 3"'$  (1.6 m)   Wt 72.6 kg   SpO2 91%   BMI 28.34 kg/m  Physical Exam Vitals and nursing note reviewed.  Constitutional:      General: She is not in acute distress.    Appearance: Normal appearance. She is well-developed. She is not ill-appearing, toxic-appearing or diaphoretic.  HENT:     Head: Normocephalic and atraumatic.     Right Ear: External ear normal.     Left Ear: External ear normal.     Nose: Nose normal.     Mouth/Throat:     Mouth: Mucous membranes are moist.     Pharynx: Oropharynx is clear.  Eyes:     General: No scleral icterus.    Extraocular Movements: Extraocular movements intact.     Conjunctiva/sclera: Conjunctivae normal.  Cardiovascular:     Rate and Rhythm: Normal rate and regular rhythm.     Heart sounds: No murmur heard. Pulmonary:     Effort: Pulmonary effort is normal. No respiratory distress.     Breath sounds: Normal breath sounds. No rales.  Chest:     Chest wall: No tenderness.  Abdominal:     General: There is no distension.     Palpations: Abdomen is soft.     Tenderness: There is no abdominal tenderness.  Musculoskeletal:        General: No swelling. Normal range of motion.     Cervical back: Normal range of motion and neck supple.     Right lower leg: No edema.     Left lower leg: No edema.  Skin:    General: Skin is warm and dry.     Capillary Refill: Capillary refill takes less than 2 seconds.     Findings: Erythema present.  Neurological:     General: No focal deficit present.     Mental Status: She is alert. She is disoriented.     Cranial Nerves: No cranial nerve deficit.     Sensory: No sensory deficit.     Motor: No weakness.     Coordination: Coordination normal.  Psychiatric:        Mood and Affect:  Mood normal.        Behavior: Behavior normal.        Thought Content: Thought content normal.        Judgment: Judgment normal.     ED Results / Procedures / Treatments   Labs (all labs ordered are listed, but only abnormal results are displayed)  Labs Reviewed  LACTIC ACID, PLASMA - Abnormal; Notable for the following components:      Result Value   Lactic Acid, Venous 2.1 (*)    All other components within normal limits  URINALYSIS, ROUTINE W REFLEX MICROSCOPIC - Abnormal; Notable for the following components:   APPearance HAZY (*)    Ketones, ur 5 (*)    Leukocytes,Ua TRACE (*)    Bacteria, UA RARE (*)    All other components within normal limits  PROTIME-INR - Abnormal; Notable for the following components:   Prothrombin Time 15.5 (*)    All other components within normal limits  CBC - Abnormal; Notable for the following components:   WBC 54.9 (*)    RDW 15.8 (*)    All other components within normal limits  COMPREHENSIVE METABOLIC PANEL - Abnormal; Notable for the following components:   Sodium 134 (*)    Potassium 3.4 (*)    Glucose, Bld 148 (*)    Albumin 3.2 (*)    All other components within normal limits  CBC WITH DIFFERENTIAL/PLATELET - Abnormal; Notable for the following components:   WBC 54.5 (*)    RDW 15.9 (*)    Eosinophils Absolute 45.9 (*)    Basophils Absolute 0.2 (*)    Abs Immature Granulocytes 0.13 (*)    All other components within normal limits  DIC (DISSEMINATED INTRAVASCULAR COAGULATION)PANEL - Abnormal; Notable for the following components:   Prothrombin Time 15.8 (*)    INR 1.3 (*)    aPTT 23 (*)    D-Dimer, Quant 1.11 (*)    All other components within normal limits  LACTATE DEHYDROGENASE - Abnormal; Notable for the following components:   LDH 448 (*)    All other components within normal limits  CBG MONITORING, ED - Abnormal; Notable for the following components:   Glucose-Capillary 149 (*)    All other components within normal limits   RESP PANEL BY RT-PCR (FLU A&B, COVID) ARPGX2  CULTURE, BLOOD (ROUTINE X 2)  CULTURE, BLOOD (ROUTINE X 2)  URINE CULTURE  LACTIC ACID, PLASMA  APTT  AMMONIA  URIC ACID  PHOSPHORUS  PATHOLOGIST SMEAR REVIEW  HIV ANTIBODY (ROUTINE TESTING W REFLEX)  BASIC METABOLIC PANEL  MAGNESIUM  CBC  LACTIC ACID, PLASMA  PROCALCITONIN  PROCALCITONIN  VITAMIN B12  RPR  TSH    EKG EKG Interpretation  Date/Time:  Tuesday August 02 2022 14:55:05 EDT Ventricular Rate:  112 PR Interval:  132 QRS Duration: 70 QT Interval:  342 QTC Calculation: 466 R Axis:   -23 Text Interpretation: Sinus tachycardia with Premature atrial complexes Confirmed by Godfrey Pick ([id number]) on 08/02/2022 6:55:55 PM  Radiology CT Head Wo Contrast  Result Date: 08/02/2022 CLINICAL DATA:  Altered mental status, recent diagnosis of lymphoma EXAM: CT HEAD WITHOUT CONTRAST TECHNIQUE: Contiguous axial images were obtained from the base of the skull through the vertex without intravenous contrast. RADIATION DOSE REDUCTION: This exam was performed according to the departmental dose-optimization program which includes automated exposure control, adjustment of the mA and/or kV according to patient size and/or use of iterative reconstruction technique. COMPARISON:  MR brain done on 04/15/2013 FINDINGS: Brain: No acute intracranial findings are seen in noncontrast CT brain. There are no signs of bleeding within the cranium. Ventricles are not dilated. Cortical sulci are prominent. There is no focal edema or mass effect. Vascular: Unremarkable Skull: Unremarkable. Sinuses/Orbits: Unremarkable. Other: None. IMPRESSION: No acute intracranial findings are seen in noncontrast CT brain. Atrophy. Electronically Signed  By: Elmer Picker M.D.   On: 08/02/2022 16:35   DG Chest 2 View  Result Date: 08/02/2022 CLINICAL DATA:  Cough, altered mental status x3 days EXAM: CHEST - 2 VIEW COMPARISON:  04/21/2021 FINDINGS: Cardiac size is within  normal limits. Thoracic aorta is tortuous. There are nodular densities of varying sizes scattered throughout both lungs largest overlying the inferior right hilum measuring 2.8 cm. Findings suggest possible metastatic disease. Less likely possibility would be nodular appearing multifocal pneumonia. There is no pleural effusion or pneumothorax. Degenerative changes are noted in both shoulders. IMPRESSION: There are multiple nodular densities of varying sizes in both lungs suggesting pulmonary metastatic disease. Follow-up CT, PET-CT and biopsy as warranted should be considered. Please correlate with clinical history for any known primary malignancy. Electronically Signed   By: Elmer Picker M.D.   On: 08/02/2022 15:37    Procedures Procedures    Medications Ordered in ED Medications  vancomycin (VANCOREADY) IVPB 750 mg/150 mL (has no administration in time range)  ceFEPIme (MAXIPIME) 2 g in sodium chloride 0.9 % 100 mL IVPB (has no administration in time range)  lactated ringers infusion ( Intravenous New Bag/Given 08/02/22 2225)  metroNIDAZOLE (FLAGYL) IVPB 500 mg (has no administration in time range)  enoxaparin (LOVENOX) injection 40 mg (has no administration in time range)  sodium chloride flush (NS) 0.9 % injection 3 mL (3 mLs Intravenous Given 08/02/22 2227)  acetaminophen (TYLENOL) tablet 650 mg (has no administration in time range)    Or  acetaminophen (TYLENOL) suppository 650 mg (has no administration in time range)  senna-docusate (Senokot-S) tablet 1 tablet (has no administration in time range)  ondansetron (ZOFRAN) tablet 4 mg (has no administration in time range)    Or  ondansetron (ZOFRAN) injection 4 mg (has no administration in time range)  hydrALAZINE (APRESOLINE) tablet 25 mg (has no administration in time range)  diphenhydrAMINE (BENADRYL) injection 25 mg (25 mg Intravenous Given 08/02/22 2249)  acetaminophen (TYLENOL) tablet 650 mg (650 mg Oral Given 08/02/22 1727)   lactated ringers bolus 1,000 mL (0 mLs Intravenous Stopped 08/02/22 2231)  lactated ringers bolus 1,000 mL (0 mLs Intravenous Stopped 08/02/22 2231)  metroNIDAZOLE (FLAGYL) IVPB 500 mg (0 mg Intravenous Stopped 08/02/22 2135)  vancomycin (VANCOREADY) IVPB 1500 mg/300 mL (0 mg Intravenous Stopped 08/02/22 2207)  lactated ringers bolus 1,000 mL (1,000 mLs Intravenous New Bag/Given 08/02/22 2157)  potassium chloride SA (KLOR-CON M) CR tablet 20 mEq (20 mEq Oral Given 08/02/22 2225)  LORazepam (ATIVAN) injection 1 mg (1 mg Intravenous Given 08/02/22 2249)    ED Course/ Medical Decision Making/ A&P                           Medical Decision Making Amount and/or Complexity of Data Reviewed Labs: ordered. Radiology: ordered.  Risk Prescription drug management. Decision regarding hospitalization.   This patient presents to the ED for concern of confusion, this involves an extensive number of treatment options, and is a complaint that carries with it a high risk of complications and morbidity.  The differential diagnosis includes infection, dehydration, metabolic encephalopathy, polypharmacy, brain metastases, CVA, encephalitis, meningitis, blast crisis, leukostasis   Co morbidities that complicate the patient evaluation   HTN, HLD, arthritis, T2DM, anemia, remote melanoma, new lymphoma   Additional history obtained:  Additional history obtained from patient's daughter External records from outside source obtained and reviewed including EMR   Lab Tests:  I Ordered, and personally interpreted labs.  The pertinent results include: Severe leukocytosis with eosinophilia.  Hemoglobin is normal.  Kidney function and electrolytes are normal.   Imaging Studies ordered:  I ordered imaging studies including CT head, chest x-ray I independently visualized and interpreted imaging which showed no acute findings on CT head.  Chest x-ray shows nodular densities suggestive of metastatic  disease I agree with the radiologist interpretation   Cardiac Monitoring: / EKG:  The patient was maintained on a cardiac monitor.  I personally viewed and interpreted the cardiac monitored which showed an underlying rhythm of: Sinus rhythm   Consultations Obtained:  I requested consultation with the oncologist, Dr. Alen Blew,  and discussed lab and imaging findings as well as pertinent plan - they recommend: Initiation of broad-spectrum antibiotics, admission to [facility].  Without blast cells on blood smear, there is low suspicion for leukostasis.  Patient to be managed for encephalopathy that is unlikely to be related to her recent lymphoma diagnosis.   Problem List / ED Course / Critical interventions / Medication management  Patient is a 66 year old female who presents from home with her daughter for altered mental status over the past 4 days.  History is provided primarily by the patient's daughter.  Patient's daughter states that the patient is typically very sharp mentally.  She has recently been informed that she has a new diagnosis of lymphoma.  She has not been started on any therapeutics for this.  Per chart review, this was done through [facility].  Patient currently endorses diffuse pruritus which has been ongoing for the past several weeks.  There have not been any medications that have helped this.  Patient denies any other symptoms.  She is disoriented to time.  She does appear pleasantly confused.  Her breathing is unlabored.  She has no focal neurologic deficits.  She has no meningismal signs.  Patient's daughter describes behavior at home is wandering into rooms for no apparent reason, memory loss, gait instability, and confusion.  She also reports that the patient has not been eating or drinking over the past 4 days.  She has not had any known vomiting or diarrhea.  Vital signs on arrival are notable for tachycardia, tachypnea, and a low-grade fever.  She was given Tylenol for  antipyresis.  Lab work shows a severe leukocytosis with eosinophilia.  Despite her recent work-up with [facility] oncology, and not able to see any recent lab work.  Chronicity of this elevated WBC is uncertain.  I do suspect that it is secondary to her lymphoma.  Poor p.o. intake and elevated WBC does raise concern for possible leukostasis as a cause of her acute confusion.  Patient was given 2 L bolus IV fluid.  I spoke with oncologist on-call, Dr. Bari Mantis who agrees with initiation of broad-spectrum antibiotics.  He has a low suspicion for leukostasis given absence of blast cells on blood smear.  He advises work-up for encephalopathy unrelated to recent diagnosis of lymphoma.  Chest x-ray showed pulmonary nodules.  Per chart review, this was identified on CT scan 4 weeks ago.  Patient's initial lactic acid was within normal limits.  Despite IV fluids, second lactate was mildly elevated at 2.1.  Following IV fluids, patient had no improvement in mentation.  She remains pleasantly confused and disoriented to time.  MRI was ordered for further evaluation.  This study was pending at time of admission.  Patient was admitted to medicine for further management. I ordered medication including IV fluids and broad-spectrum antibiotics for concern of sepsis  Reevaluation of the patient after these medicines showed that the patient stayed the same I have reviewed the patients home medicines and have made adjustments as needed   Social Determinants of Health:  Has access to outpatient care   CRITICAL CARE Performed by: Godfrey Pick   Total critical care time: 35 minutes  Critical care time was exclusive of separately billable procedures and treating other patients.  Critical care was necessary to treat or prevent imminent or life-threatening deterioration.  Critical care was time spent personally by me on the following activities: development of treatment plan with patient and/or surrogate as well as nursing,  discussions with consultants, evaluation of patient's response to treatment, examination of patient, obtaining history from patient or surrogate, ordering and performing treatments and interventions, ordering and review of laboratory studies, ordering and review of radiographic studies, pulse oximetry and re-evaluation of patient's condition.         Final Clinical Impression(s) / ED Diagnoses Final diagnoses:  Encephalopathy acute  SIRS (systemic inflammatory response syndrome) ([place])    Rx / DC Orders ED Discharge Orders     None         Godfrey Pick, MD 08/02/22 2251

## 2022-08-02 NOTE — Progress Notes (Addendum)
Pharmacy Antibiotic Note  Sarah Walsh is a 66 y.o. female admitted on 08/02/2022 with sepsis.  On presentation patient was febrile with elevated WBC, RR and HR. Pharmacy has been consulted for Cefepime and vancomycin dosing.  Plan: Initiate cefepime 2g Q8h Initiate vancomycin '1500mg'$  once, then '750mg'$  Q12h (eAUC 472.5, Scr 0.8) Monitor renal function, s/s of infection, cultures for pathogen directed therapy  Height: '5\' 3"'$  (160 cm) Weight: 72.6 kg (160 lb) IBW/kg (Calculated) : 52.4  Temp (24hrs), Avg:100.4 F (38 C), Min:100.1 F (37.8 C), Max:100.8 F (38.2 C)  Recent Labs  Lab 08/02/22 1351 08/02/22 1400 08/02/22 1516  WBC 54.9* 54.5*  --   CREATININE 0.75  --   --   LATICACIDVEN  --   --  1.7    Estimated Creatinine Clearance: 64.3 mL/min (by C-G formula based on SCr of 0.75 mg/dL).    Allergies  Allergen Reactions   Statins Other (See Comments)    myalgia Other reaction(s): Other (See Comments) Other Reaction(s): Other  myalgia Other Reaction(s): Other  myalgia    Codeine Itching and Other (See Comments)    Antimicrobials this admission: Vancomycin 10/10 >>  Cefepime 10/10 >>   Dose adjustments this admission: N/a  Microbiology results: 10/10 BCx: Collected 10/10 Resp panel: negative   Thank you for allowing pharmacy to be a part of this patient's care.  Titus Dubin, PharmD PGY1 Pharmacy Resident 08/02/2022 7:13 PM

## 2022-08-02 NOTE — H&P (Signed)
History and Physical    Sarah Walsh AST:[id number] DOB: 09-30-1956 DOA: 08/02/2022  PCP: Sueanne Margarita, DO   Patient coming from: Home   Chief Complaint: Lethargy, confusion, not eating or drinking   HPI: Sarah Walsh is a pleasant 66 y.o. female with medical history significant for hypertension, hyperlipidemia, type 2 diabetes mellitus, and undergoing malignancy work-up through [facility] with recent biopsy suggesting lymphoma, now presenting to the emergency department with lethargy, confusion, anorexia, and balance difficulty.  Patient reports severe generalized pruritus but denies any pain, nausea, vomiting, diarrhea, dysuria, or shortness of breath.  She denies headache or neck stiffness. Her daughter is at the bedside and notes that the patient seemed to be having difficulty with her balance the evening of 07/29/2022, seem to have some mild confusion, was not interested in eating or drinking anything, and all the symptoms have progressively worsened since then.  ED Course: Upon arrival to the ED, patient is found to be febrile to 38.2 C and saturating well on room air with tachycardia and stable blood pressure.  EKG features sinus tachycardia with PACs.  Chest x-ray reveals multiple nodules bilaterally suspicious for metastatic disease.  Head CT is negative for acute intracranial abnormality.  Chemistry panel notable for mild hypokalemia.  D-dimer is 1.11.  WBC is 54,900.  Lactic acid was 2.1.    Blood cultures were collected and the patient was given 2 L of LR, vancomycin, cefepime, and Flagyl in the ED.  Oncology (Dr. Alen Blew) was consulted by the ED physician and recommended admission to either campus for antibiotics and IV fluids, and recommended consulting them again tomorrow.  Review of Systems:  All other systems reviewed and apart from HPI, are negative.  Past Medical History:  Diagnosis Date   Anemia    Arthritis    bilateral knees   Colon polyp 05/29/2019   Depression    on  meds-situational anxiety   Diabetes mellitus without complication ([place])    on meds   Hyperlipemia    on meds   Hypertension    hx of    Osteopenia 04/23/2021   DEXA 03/2021: lowest T = -2.0, osteeopenia. Recheck 2 years. Ca/D    Past Surgical History:  Procedure Laterality Date   ABDOMINAL HYSTERECTOMY     BREAST IMPLANT REMOVAL Bilateral 12/10/2020   Procedure: REMOVAL BREAST IMPLANTS;  Surgeon: Wallace Going, DO;  Location: [place];  Service: Plastics;  Laterality: Bilateral;  2 hours please   CAPSULECTOMY Bilateral 12/10/2020   Procedure: CAPSULECTOMY;  Surgeon: Wallace Going, DO;  Location: [place];  Service: Plastics;  Laterality: Bilateral;   CESAREAN SECTION     4 times   COLONOSCOPY  2020   Gupta-MAC-tics/hems/multiple polyps   POLYPECTOMY  2020   multiple polyps   TOTAL KNEE ARTHROPLASTY Left 05/03/2021   Procedure: LEFT TOTAL KNEE ARTHROPLASTY;  Surgeon: Leandrew Koyanagi, MD;  Location: [place];  Service: Orthopedics;  Laterality: Left;   WISDOM TOOTH EXTRACTION      Social History:   reports that she quit smoking about 38 years ago. Her smoking use included cigarettes. She has never used smokeless tobacco. She reports that she does not drink alcohol and does not use drugs.  Allergies  Allergen Reactions   Statins Other (See Comments)    myalgia Other reaction(s): Other (See Comments) Other Reaction(s): Other  myalgia Other Reaction(s): Other  myalgia    Codeine Itching and Other (See Comments)    Family  History  Problem Relation Age of Onset   Alzheimer's disease Mother    Diabetes Daughter    Healthy Daughter    Diabetes Father    Colon polyps Father    Healthy Daughter    Healthy Daughter    Healthy Son    Esophageal cancer Neg Hx    Rectal cancer Neg Hx    Stomach cancer Neg Hx    Colon cancer Neg Hx      Prior to Admission medications   Medication Sig Start Date End Date Taking? Authorizing  Provider  oxyCODONE-acetaminophen (PERCOCET) 5-325 MG tablet Take 1-2 tablets by mouth every 6 (six) hours as needed. 05/14/21   Aundra Dubin, PA-C  predniSONE (STERAPRED UNI-PAK 21 TAB) 5 MG (21) TBPK tablet Take as directed 06/01/21   Aundra Dubin, PA-C  traMADol (ULTRAM) 50 MG tablet Take 1-2 tablets (50-100 mg total) by mouth every 6 (six) hours as needed. 05/04/21   Aundra Dubin, PA-C  Ascorbic Acid (VITAMIN C PO) Take 2,000 mg by mouth daily.    [provider]  aspirin EC 81 MG tablet Take 1 tablet (81 mg total) by mouth 2 (two) times daily. To be taken after surgery 04/28/21   Aundra Dubin, PA-C  B Complex Vitamins (B COMPLEX PO) Take 1 capsule by mouth daily.    [provider]  cetirizine (ZYRTEC ALLERGY) 10 MG tablet Take 1 tablet (10 mg total) by mouth at bedtime. 06/27/22 08/26/22  Lynden Oxford Scales, PA-C  Cholecalciferol 25 MCG (1000 UT) tablet Take 1,000 Units by mouth 2 (two) times daily.    [provider]  diphenhydrAMINE (BENADRYL) 25 MG tablet Take 25 mg by mouth at bedtime.    [provider]  ezetimibe (ZETIA) 10 MG tablet Take 1 tablet by mouth once daily 06/02/21   Leamon Arnt, MD  FLUoxetine (PROZAC) 20 MG capsule Take 20 mg by mouth daily.    [provider]  gabapentin (NEURONTIN) 300 MG capsule Take 1 capsule by mouth at bedtime 06/02/21   Leamon Arnt, MD  hydrochlorothiazide (MICROZIDE) 12.5 MG capsule TAKE 1 CAPSULE BY MOUTH ONCE DAILY AS NEEDED 06/02/21   Leamon Arnt, MD  magnesium oxide (MAG-OX) 400 MG tablet Take 400 mg by mouth daily.    [provider]  metFORMIN (GLUCOPHAGE) 500 MG tablet Take 1 tablet (500 mg total) by mouth 2 (two) times daily with a meal. Schedule an appointment for further refills. 04/09/21   Leamon Arnt, MD  methocarbamol (ROBAXIN) 500 MG tablet Take 1 tablet (500 mg total) by mouth 2 (two) times daily as needed. To be taken after surgery 04/28/21   Aundra Dubin,  PA-C  montelukast (SINGULAIR) 10 MG tablet Take 1 tablet (10 mg total) by mouth at bedtime. 06/27/22 08/26/22  Lynden Oxford Scales, PA-C  ondansetron (ZOFRAN) 4 MG tablet Take 1 tablet (4 mg total) by mouth every 8 (eight) hours as needed for nausea or vomiting. 04/28/21   Aundra Dubin, PA-C  Potassium 99 MG TABS Take 99 mg by mouth daily.    [provider]  sulfamethoxazole-trimethoprim (BACTRIM DS) 800-160 MG tablet Take 1 tablet by mouth 2 (two) times daily. To be taken after surgery 04/28/21   Aundra Dubin, [place]    Physical Exam: Vitals:   08/02/22 1830 08/02/22 1939 08/02/22 2001 08/02/22 2108  BP: 138/86 (!) 141/93  135/81  Pulse: 98 (!) 101  (!) 104  Resp: 19  15  (!) 24  Temp:   98.8 F (37.1 C)   TempSrc:   Oral   SpO2: 93% 94%  93%  Weight:      Height:        Constitutional: NAD, no pallor or diaphoresis   Eyes: PERTLA, lids and conjunctivae normal ENMT: Mucous membranes are moist. Posterior pharynx clear of any exudate or lesions.   Neck: supple, no masses  Respiratory: no wheezing, no crackles. No accessory muscle use.  Cardiovascular: S1 & S2 heard, regular rate and rhythm. No extremity edema.   Abdomen: No distension, no tenderness, soft. Bowel sounds active.  Musculoskeletal: no clubbing / cyanosis. No joint deformity upper and lower extremities.   Skin: superficial excoriations. Warm, dry, well-perfused. Neurologic: CN 2-12 grossly intact. Sensation intact. Moving all extremities. Alert and oriented to person and place only.  Psychiatric: Pleasant. Cooperative.    Labs and Imaging on Admission: I have personally reviewed following labs and imaging studies  CBC: Recent Labs  Lab 08/02/22 1351 08/02/22 1400 08/02/22 1902  WBC 54.9* 54.5*  --   NEUTROABS  --  5.8  --   HGB 13.6 13.6  --   HCT 39.3 39.7  --   MCV 94.2 94.3  --   PLT 284 296 956   Basic Metabolic Panel: Recent Labs  Lab 08/02/22 1351 08/02/22 1902  NA 134*  --   K 3.4*   --   CL 99  --   CO2 23  --   GLUCOSE 148*  --   BUN 11  --   CREATININE 0.75  --   CALCIUM 9.2  --   PHOS  --  2.6   GFR: Estimated Creatinine Clearance: 64.3 mL/min (by C-G formula based on SCr of 0.75 mg/dL). Liver Function Tests: Recent Labs  Lab 08/02/22 1351  AST 22  ALT 14  ALKPHOS 118  BILITOT 0.9  PROT 7.5  ALBUMIN 3.2*   No results for input(s): "LIPASE", "AMYLASE" in the last 168 hours. Recent Labs  Lab 08/02/22 1902  AMMONIA 29   Coagulation Profile: Recent Labs  Lab 08/02/22 1902  INR 1.3*  1.2   Cardiac Enzymes: No results for input(s): "CKTOTAL", "CKMB", "CKMBINDEX", "TROPONINI" in the last 168 hours. BNP (last 3 results) No results for input(s): "PROBNP" in the last 8760 hours. HbA1C: No results for input(s): "HGBA1C" in the last 72 hours. CBG: Recent Labs  Lab 08/02/22 1506  GLUCAP 149*   Lipid Profile: No results for input(s): "CHOL", "HDL", "LDLCALC", "TRIG", "CHOLHDL", "LDLDIRECT" in the last 72 hours. Thyroid Function Tests: No results for input(s): "TSH", "T4TOTAL", "FREET4", "T3FREE", "THYROIDAB" in the last 72 hours. Anemia Panel: No results for input(s): "VITAMINB12", "FOLATE", "FERRITIN", "TIBC", "IRON", "RETICCTPCT" in the last 72 hours. Urine analysis:    Component Value Date/Time   COLORURINE YELLOW 08/02/2022 2137   APPEARANCEUR HAZY (A) 08/02/2022 2137   LABSPEC 1.012 08/02/2022 2137   PHURINE 5.0 08/02/2022 2137   GLUCOSEU NEGATIVE 08/02/2022 2137   HGBUR NEGATIVE 08/02/2022 2137   [place] NEGATIVE 08/02/2022 2137   KETONESUR 5 (A) 08/02/2022 2137   PROTEINUR NEGATIVE 08/02/2022 2137   NITRITE NEGATIVE 08/02/2022 2137   LEUKOCYTESUR TRACE (A) 08/02/2022 2137   Sepsis Labs: _0 (procalcitonin:4,lacticidven:4) ) Recent Results (from the past 240 hour(s))  Resp Panel by RT-PCR (Flu A&B, Covid) Anterior Nasal Swab     Status: None   Collection Time: 08/02/22  3:10 PM   Specimen: Anterior Nasal Swab   Result Value  Ref Range Status   SARS Coronavirus 2 by RT PCR NEGATIVE NEGATIVE Final    Comment: (NOTE) SARS-CoV-2 target nucleic acids are NOT DETECTED.  The SARS-CoV-2 RNA is generally detectable in upper respiratory specimens during the acute phase of infection. The lowest concentration of SARS-CoV-2 viral copies this assay can detect is 138 copies/mL. A negative result does not preclude SARS-Cov-2 infection and should not be used as the sole basis for treatment or other patient management decisions. A negative result may occur with  improper specimen collection/handling, submission of specimen other than nasopharyngeal swab, presence of viral mutation(s) within the areas targeted by this assay, and inadequate number of viral copies(<138 copies/mL). A negative result must be combined with clinical observations, patient history, and epidemiological information. The expected result is Negative.  Fact Sheet for Patients:  EntrepreneurPulse.com.au  Fact Sheet for Healthcare Providers:  IncredibleEmployment.be  This test is no t yet approved or cleared by the [place] FDA and  has been authorized for detection and/or diagnosis of SARS-CoV-2 by FDA under an Emergency Use Authorization (EUA). This EUA will remain  in effect (meaning this test can be used) for the duration of the COVID-19 declaration under Section 564(b)(1) of the Act, 21 U.S.C.section 360bbb-3(b)(1), unless the authorization is terminated  or revoked sooner.       Influenza A by PCR NEGATIVE NEGATIVE Final   Influenza B by PCR NEGATIVE NEGATIVE Final    Comment: (NOTE) The Xpert Xpress SARS-CoV-2/FLU/RSV plus assay is intended as an aid in the diagnosis of influenza from Nasopharyngeal swab specimens and should not be used as a sole basis for treatment. Nasal washings and aspirates are unacceptable for Xpert Xpress SARS-CoV-2/FLU/RSV testing.  Fact Sheet for  Patients: EntrepreneurPulse.com.au  Fact Sheet for Healthcare Providers: IncredibleEmployment.be  This test is not yet approved or cleared by the [place] FDA and has been authorized for detection and/or diagnosis of SARS-CoV-2 by FDA under an Emergency Use Authorization (EUA). This EUA will remain in effect (meaning this test can be used) for the duration of the COVID-19 declaration under Section 564(b)(1) of the Act, 21 U.S.C. section 360bbb-3(b)(1), unless the authorization is terminated or revoked.  Performed at [facility], [place] [address] [place], [place] [address]      Radiological Exams on Admission: CT Head Wo Contrast  Result Date: 08/02/2022 CLINICAL DATA:  Altered mental status, recent diagnosis of lymphoma EXAM: CT HEAD WITHOUT CONTRAST TECHNIQUE: Contiguous axial images were obtained from the base of the skull through the vertex without intravenous contrast. RADIATION DOSE REDUCTION: This exam was performed according to the departmental dose-optimization program which includes automated exposure control, adjustment of the mA and/or kV according to patient size and/or use of iterative reconstruction technique. COMPARISON:  MR brain done on 04/15/2013 FINDINGS: Brain: No acute intracranial findings are seen in noncontrast CT brain. There are no signs of bleeding within the cranium. Ventricles are not dilated. Cortical sulci are prominent. There is no focal edema or mass effect. Vascular: Unremarkable Skull: Unremarkable. Sinuses/Orbits: Unremarkable. Other: None. IMPRESSION: No acute intracranial findings are seen in noncontrast CT brain. Atrophy. Electronically Signed   By: Elmer Picker M.D.   On: 08/02/2022 16:35   DG Chest 2 View  Result Date: 08/02/2022 CLINICAL DATA:  Cough, altered mental status x3 days EXAM: CHEST - 2 VIEW COMPARISON:  04/21/2021 FINDINGS: Cardiac size is within normal limits. Thoracic aorta is  tortuous. There are nodular densities of varying sizes scattered throughout both lungs largest  overlying the inferior right hilum measuring 2.8 cm. Findings suggest possible metastatic disease. Less likely possibility would be nodular appearing multifocal pneumonia. There is no pleural effusion or pneumothorax. Degenerative changes are noted in both shoulders. IMPRESSION: There are multiple nodular densities of varying sizes in both lungs suggesting pulmonary metastatic disease. Follow-up CT, PET-CT and biopsy as warranted should be considered. Please correlate with clinical history for any known primary malignancy. Electronically Signed   By: Elmer Picker M.D.   On: 08/02/2022 15:37    EKG: Independently reviewed. Sinus tachycardia, rate 112, PACs.   Assessment/Plan   1. SIRS  - Presents with confusion and lethargy and found to have fever, tachycardia, and marked leukocytosis   - No source of infection has been identified  - Blood and urine cultures were collected in ED and she was started on broad-spectrum antibiotics  - Continue antibiotics, follow cultures and clinical course   2. Acute encephalopathy  - Presents with 4 days of progressive confusion and lethargy  - No acute findings on head CT  - There is multiple SIRS criteria and this could be from sepsis   - MRI brain was ordered by ED but not yet performed  - Follow-up MRI findings, treat possible sepsis as above, check TSH, ammonia, RPR, and B12    3. Lymphoma; lung nodules  - Undergoing workup at [facility] with recent excisional biopsy of cervical node concerning for lymphoma  - She is scheduled for follow-up with oncology at [facility] on 08/05/22    4. Hypertension  - Treat as-needed only for now   5. Elevated d-dimer  - Check CTA chest and LE venous US   DVT prophylaxis: Lovenox  Code Status: Full   Level of Care: Level of care: Telemetry Medical Family Communication: Daughter at bedside   Disposition Plan:  Patient is  from: Home Anticipated d/c is to: TBD Anticipated d/c date is: Possibly as early as 10/11 or 08/04/22  Patient currently: Pending improved mental status, transition to oral medications  Consults called: none  Admission status: Observation     Vianne Bulls, MD [facility]  08/02/2022, 10:06 PM

## 2022-08-03 ENCOUNTER — Observation Stay (HOSPITAL_COMMUNITY): Payer: Medicare HMO

## 2022-08-03 DIAGNOSIS — I63413 Cerebral infarction due to embolism of bilateral middle cerebral arteries: Secondary | ICD-10-CM | POA: Diagnosis present

## 2022-08-03 DIAGNOSIS — C8448 Peripheral T-cell lymphoma, not classified, lymph nodes of multiple sites: Secondary | ICD-10-CM | POA: Diagnosis not present

## 2022-08-03 DIAGNOSIS — Z7984 Long term (current) use of oral hypoglycemic drugs: Secondary | ICD-10-CM | POA: Diagnosis not present

## 2022-08-03 DIAGNOSIS — I471 Supraventricular tachycardia, unspecified: Secondary | ICD-10-CM | POA: Diagnosis not present

## 2022-08-03 DIAGNOSIS — I82401 Acute embolism and thrombosis of unspecified deep veins of right lower extremity: Secondary | ICD-10-CM

## 2022-08-03 DIAGNOSIS — J9601 Acute respiratory failure with hypoxia: Secondary | ICD-10-CM | POA: Diagnosis not present

## 2022-08-03 DIAGNOSIS — I6389 Other cerebral infarction: Secondary | ICD-10-CM

## 2022-08-03 DIAGNOSIS — G9341 Metabolic encephalopathy: Secondary | ICD-10-CM

## 2022-08-03 DIAGNOSIS — E119 Type 2 diabetes mellitus without complications: Secondary | ICD-10-CM | POA: Diagnosis present

## 2022-08-03 DIAGNOSIS — R7989 Other specified abnormal findings of blood chemistry: Secondary | ICD-10-CM | POA: Diagnosis not present

## 2022-08-03 DIAGNOSIS — R29703 NIHSS score 3: Secondary | ICD-10-CM | POA: Diagnosis present

## 2022-08-03 DIAGNOSIS — C8590 Non-Hodgkin lymphoma, unspecified, unspecified site: Secondary | ICD-10-CM | POA: Diagnosis not present

## 2022-08-03 DIAGNOSIS — Z7189 Other specified counseling: Secondary | ICD-10-CM | POA: Diagnosis not present

## 2022-08-03 DIAGNOSIS — Z1152 Encounter for screening for COVID-19: Secondary | ICD-10-CM | POA: Diagnosis not present

## 2022-08-03 DIAGNOSIS — Z515 Encounter for palliative care: Secondary | ICD-10-CM | POA: Diagnosis not present

## 2022-08-03 DIAGNOSIS — Z87891 Personal history of nicotine dependence: Secondary | ICD-10-CM | POA: Diagnosis not present

## 2022-08-03 DIAGNOSIS — Z66 Do not resuscitate: Secondary | ICD-10-CM | POA: Diagnosis not present

## 2022-08-03 DIAGNOSIS — E872 Acidosis, unspecified: Secondary | ICD-10-CM | POA: Diagnosis present

## 2022-08-03 DIAGNOSIS — G9340 Encephalopathy, unspecified: Secondary | ICD-10-CM | POA: Diagnosis not present

## 2022-08-03 DIAGNOSIS — I82409 Acute embolism and thrombosis of unspecified deep veins of unspecified lower extremity: Secondary | ICD-10-CM | POA: Diagnosis present

## 2022-08-03 DIAGNOSIS — I63423 Cerebral infarction due to embolism of bilateral anterior cerebral arteries: Secondary | ICD-10-CM | POA: Diagnosis present

## 2022-08-03 DIAGNOSIS — E876 Hypokalemia: Secondary | ICD-10-CM | POA: Diagnosis present

## 2022-08-03 DIAGNOSIS — I10 Essential (primary) hypertension: Secondary | ICD-10-CM | POA: Diagnosis present

## 2022-08-03 DIAGNOSIS — R791 Abnormal coagulation profile: Secondary | ICD-10-CM

## 2022-08-03 DIAGNOSIS — I639 Cerebral infarction, unspecified: Secondary | ICD-10-CM | POA: Diagnosis not present

## 2022-08-03 DIAGNOSIS — I82451 Acute embolism and thrombosis of right peroneal vein: Secondary | ICD-10-CM | POA: Diagnosis not present

## 2022-08-03 DIAGNOSIS — A4102 Sepsis due to Methicillin resistant Staphylococcus aureus: Secondary | ICD-10-CM | POA: Diagnosis not present

## 2022-08-03 DIAGNOSIS — I340 Nonrheumatic mitral (valve) insufficiency: Secondary | ICD-10-CM | POA: Diagnosis not present

## 2022-08-03 DIAGNOSIS — E440 Moderate protein-calorie malnutrition: Secondary | ICD-10-CM | POA: Diagnosis present

## 2022-08-03 DIAGNOSIS — I824Z2 Acute embolism and thrombosis of unspecified deep veins of left distal lower extremity: Secondary | ICD-10-CM

## 2022-08-03 DIAGNOSIS — I82452 Acute embolism and thrombosis of left peroneal vein: Secondary | ICD-10-CM | POA: Diagnosis present

## 2022-08-03 DIAGNOSIS — E871 Hypo-osmolality and hyponatremia: Secondary | ICD-10-CM | POA: Diagnosis present

## 2022-08-03 DIAGNOSIS — Z6828 Body mass index (BMI) 28.0-28.9, adult: Secondary | ICD-10-CM | POA: Diagnosis not present

## 2022-08-03 DIAGNOSIS — J189 Pneumonia, unspecified organism: Secondary | ICD-10-CM | POA: Diagnosis not present

## 2022-08-03 DIAGNOSIS — Q2112 Patent foramen ovale: Secondary | ICD-10-CM | POA: Diagnosis not present

## 2022-08-03 DIAGNOSIS — R4182 Altered mental status, unspecified: Secondary | ICD-10-CM | POA: Diagnosis present

## 2022-08-03 DIAGNOSIS — Z0389 Encounter for observation for other suspected diseases and conditions ruled out: Secondary | ICD-10-CM | POA: Diagnosis not present

## 2022-08-03 DIAGNOSIS — C8580 Other specified types of non-Hodgkin lymphoma, unspecified site: Secondary | ICD-10-CM | POA: Diagnosis present

## 2022-08-03 DIAGNOSIS — E785 Hyperlipidemia, unspecified: Secondary | ICD-10-CM | POA: Diagnosis present

## 2022-08-03 DIAGNOSIS — A419 Sepsis, unspecified organism: Secondary | ICD-10-CM | POA: Diagnosis present

## 2022-08-03 DIAGNOSIS — I63433 Cerebral infarction due to embolism of bilateral posterior cerebral arteries: Secondary | ICD-10-CM | POA: Diagnosis present

## 2022-08-03 DIAGNOSIS — C8519 Unspecified B-cell lymphoma, extranodal and solid organ sites: Secondary | ICD-10-CM | POA: Diagnosis present

## 2022-08-03 LAB — LIPID PANEL
Cholesterol: 127 mg/dL (ref 0–200)
HDL: 17 mg/dL — ABNORMAL LOW (ref >40)
LDL Cholesterol: 91 mg/dL (ref 0–99)
Total CHOL/HDL Ratio: 7.5 RATIO
Triglycerides: 97 mg/dL (ref <150)
VLDL: 19 mg/dL (ref 0–40)

## 2022-08-03 LAB — ECHOCARDIOGRAM COMPLETE
Height: 63 in
S' Lateral: 2.4 cm
Weight: 2560 oz

## 2022-08-03 LAB — HEPARIN LEVEL (UNFRACTIONATED): Heparin Unfractionated: <0.1 IU/mL — ABNORMAL LOW (ref 0.30–0.70)

## 2022-08-03 LAB — BASIC METABOLIC PANEL
Anion gap: 11 (ref 5–15)
BUN: 7 mg/dL — ABNORMAL LOW (ref 8–23)
CO2: 23 mmol/L (ref 22–32)
Calcium: 8.2 mg/dL — ABNORMAL LOW (ref 8.9–10.3)
Chloride: 100 mmol/L (ref 98–111)
Creatinine, Ser: 0.66 mg/dL (ref 0.44–1.00)
GFR, Estimated: >60 mL/min (ref >60)
Glucose, Bld: 136 mg/dL — ABNORMAL HIGH (ref 70–99)
Potassium: 3.4 mmol/L — ABNORMAL LOW (ref 3.5–5.1)
Sodium: 134 mmol/L — ABNORMAL LOW (ref 135–145)

## 2022-08-03 LAB — HEMOGLOBIN A1C
Hgb A1c MFr Bld: 6 % — ABNORMAL HIGH (ref 4.8–5.6)
Mean Plasma Glucose: 125.5 mg/dL

## 2022-08-03 LAB — CBC
HCT: 35.1 % — ABNORMAL LOW (ref 36.0–46.0)
Hemoglobin: 11.7 g/dL — ABNORMAL LOW (ref 12.0–15.0)
MCH: 31.9 pg (ref 26.0–34.0)
MCHC: 33.3 g/dL (ref 30.0–36.0)
MCV: 95.6 fL (ref 80.0–100.0)
Platelets: 232 10*3/uL (ref 150–400)
RBC: 3.67 MIL/uL — ABNORMAL LOW (ref 3.87–5.11)
RDW: 15.7 % — ABNORMAL HIGH (ref 11.5–15.5)
WBC: 48.8 10*3/uL — ABNORMAL HIGH (ref 4.0–10.5)
nRBC: 0 % (ref 0.0–0.2)

## 2022-08-03 LAB — MAGNESIUM: Magnesium: 1.9 mg/dL (ref 1.7–2.4)

## 2022-08-03 LAB — RPR: RPR Ser Ql: NONREACTIVE

## 2022-08-03 LAB — PROCALCITONIN
Procalcitonin: <0.1 ng/mL
Procalcitonin: <0.1 ng/mL

## 2022-08-03 MED ORDER — IOHEXOL 350 MG/ML SOLN
75.0000 mL | Freq: Once | INTRAVENOUS | Status: AC | PRN
  Administered 2022-08-03: 75 mL via INTRAVENOUS

## 2022-08-03 MED ORDER — HEPARIN BOLUS VIA INFUSION
4000.0000 [IU] | Freq: Once | INTRAVENOUS | Status: AC
  Administered 2022-08-03: 4000 [IU] via INTRAVENOUS
  Filled 2022-08-03: qty 4000

## 2022-08-03 MED ORDER — HYDRALAZINE HCL 20 MG/ML IJ SOLN
10.0000 mg | Freq: Four times a day (QID) | INTRAMUSCULAR | Status: DC | PRN
  Administered 2022-08-04 – 2022-08-06 (×2): 10 mg via INTRAVENOUS
  Filled 2022-08-03 (×2): qty 1

## 2022-08-03 MED ORDER — POTASSIUM CHLORIDE CRYS ER 20 MEQ PO TBCR
40.0000 meq | EXTENDED_RELEASE_TABLET | Freq: Once | ORAL | Status: AC
  Administered 2022-08-03: 40 meq via ORAL
  Filled 2022-08-03: qty 2

## 2022-08-03 MED ORDER — HEPARIN BOLUS VIA INFUSION
2000.0000 [IU] | Freq: Once | INTRAVENOUS | Status: DC
  Filled 2022-08-03: qty 2000

## 2022-08-03 MED ORDER — HEPARIN (PORCINE) 25000 UT/250ML-% IV SOLN
1200.0000 [IU]/h | INTRAVENOUS | Status: DC
  Administered 2022-08-03: 1000 [IU]/h via INTRAVENOUS
  Filled 2022-08-03: qty 250

## 2022-08-03 MED ORDER — LACTATED RINGERS IV SOLN: INTRAVENOUS | Status: DC

## 2022-08-03 NOTE — Consult Note (Signed)
Hematology/Oncology Consult Note  Clinical Summary: [name] is a 66 year old female with medical history significant for newly diagnosed T-cell lymphoma who is currently admitted with new multiple embolic strokes and LLE VTE.   Reason for Consult: newly diagnosed lymphoma  HPI: [name] is a 66 year old female who was admitted on 08/02/2022 with lethargy, confusion, and balance difficulty.  On arrival to the emergency department patient was febrile to 38.2 degrees and was found to be tachycardic.  She had elevation of white blood cell counts to 54.9 with a lactic acid of 2.1.  She was started on empiric antibiotic therapy and admitted for supportive care.  As per the patient's initial assessment she underwent MRI brain which showed numerous diffuse bilateral foci of abnormal diffusion restriction, consistent with embolic shower and watershed ischemia.  Lower extremity ultrasound revealed findings consistent with acute deep vein thrombosis of the left lower extremity.  CT PE study was negative for PE.  Labs showed a marked leukocytosis with white blood cell count of 54.9 with normal hemoglobin and platelet count.  Interestingly it was eosinophilic predominance.  Review of outside records though do confirm that the right cervical lymph node was consistent with lymphoma.  On exam today Sarah Walsh is unaccompanied.  Per the nurse the patient's daughter recently left.  She is not clear on the details of her admission.  She reports that she is not currently having any fevers, chills, sweats, nausea, vomiting or diarrhea.  She does feel weak.  A full 10 point ROS was otherwise negative.  O:  Vitals:   08/03/22 1629 08/03/22 2108  BP: 128/69 (!) 140/84  Pulse:  98  Resp: (!) 24 (!) 22  Temp: (!) 100.6 F (38.1 C) 98.1 F (36.7 C)  SpO2:  96%      Latest Ref Rng & Units 08/03/2022    4:10 AM 08/02/2022    1:51 PM 05/04/2021    4:57 AM  CMP  Glucose 70 - 99 mg/dL 136  148  191    BUN 8 - 23 mg/dL '7  11  6   '$ Creatinine 0.44 - 1.00 mg/dL 0.66  0.75  0.78   Sodium 135 - 145 mmol/L 134  134  132   Potassium 3.5 - 5.1 mmol/L 3.4  3.4  4.2   Chloride 98 - 111 mmol/L 100  99  98   CO2 22 - 32 mmol/L '23  23  27   '$ Calcium 8.9 - 10.3 mg/dL 8.2  9.2  8.4   Total Protein 6.5 - 8.1 g/dL  7.5    Total Bilirubin 0.3 - 1.2 mg/dL  0.9    Alkaline Phos 38 - 126 U/L  118    AST 15 - 41 U/L  22    ALT 0 - 44 U/L  14        Latest Ref Rng & Units 08/03/2022    4:10 AM 08/02/2022    7:02 PM 08/02/2022    2:00 PM  CBC  WBC 4.0 - 10.5 K/uL 48.8   54.5   Hemoglobin 12.0 - 15.0 g/dL 11.7   13.6   Hematocrit 36.0 - 46.0 % 35.1   39.7   Platelets 150 - 400 K/uL 232  278  296       GENERAL: Chronically ill-appearing elderly Caucasian female in NAD  SKIN: skin color, texture, turgor are normal, no rashes or significant lesions EYES: conjunctiva are pink and non-injected, sclera clear LUNGS: clear to auscultation and  percussion with normal breathing effort HEART: regular rate & rhythm and no murmurs and no lower extremity edema Musculoskeletal: no cyanosis of digits and no clubbing  PSYCH: alert & oriented x 3, fluent speech NEURO: no focal motor/sensory deficits  Assessment/Plan: Sarah Walsh is a 66 year old female with medical history significant for newly diagnosed T-cell lymphoma who is currently admitted with new multiple embolic strokes and LLE VTE.   # Newly Diagnosed Lymphoma -- Patient has establish care with [facility] oncology and is intended to start treatment with them. --Based on the outside pathology report findings are consistent with lymphoma, though the subtype of lymphoma is not clear based on our review of the outside reports.  Reportedly this is a T-cell lymphoma. -- At this time it is unclear if her current embolic showering in the brain and lower extremity DVT are related to her current diagnosis of lymphoma -- Patient's fevers may be related to tumor fever,  though at this time do agree with empiric antibiotic therapy and collecting of culture data. --Given her weakened state she does not appear to be in good condition for starting chemotherapy at this time.  Hopefully she will make substantial recoveries in her functional status and be able to start treatment as planned. --No clear indication for pulse steroids at this time as the patient is thought to be septic. -- At time of discharge sure patient has follow-up with [facility] oncology  # Embolic Strokes # LLE DVT -- Appreciate consult to neurology.  Anticoagulation per their recommendation.   Ledell Peoples, MD Department of Hematology/Oncology [place] at [facility] Phone: [phone] Pager: [phone] Email: [email or web address]

## 2022-08-03 NOTE — Progress Notes (Signed)
[facility]                                                                              Sarah Walsh, is a 66 y.o. female, DOB - [birth year], HKV:[id number] Admit date - 08/02/2022    Outpatient Primary MD for the patient is Sueanne Margarita, DO  LOS - 0  days  Chief Complaint  Patient presents with   Altered Mental Status       Brief summary   Patient is a 66 year old female with HTN, HLP, diabetes mellitus type 2, undergoing malignancy work-up through [facility] and found to have lymphoma, not yet started on treatment, presented to ED with lethargy, confusion, anorexia, gait instability.  Patient had severe generalized pruritus but no pain, nausea vomiting, headache or neck stiffness.  Daughter reported that patient seemed to have difficulty with her balance since the evening of 07/29/2022 (Friday), was not interested in eating or drinking anything, mild confusion.  All her symptoms have progressively worsened since then. In ED, patient was found to be febrile with temp of 100.8 F, tachycardiac 107, respiratory rate 24, BP 152/86, CBC showed leukocytosis, WBCs 54.9K, mild hyponatremia, Na 134, creatinine 0.7.  Lactic acid 2.1.  Procalcitonin less than 0.1 Influenza, COVID-19 negative UA negative for UTI CTA chest showed no evidence of PE, multiple enlarged mediastinal and hilar lymphadenopathy and pulmonary nodules bilaterally measuring up to 4.1 cm concerning for neoplastic process or lymphoma.  Mildly hazy geographic groundglass opacities in the lungs  Assessment & Plan    Principal Problem: Sepsis ([place]) -Patient met sepsis criteria on admission with tachycardia, fevers, leukocytosis, lactic acidosis, source unclear.  No UTI, no pneumonia however has lymphoma, recently diagnosed -Follow blood cultures, placed on IV fluids, broad-spectrum IV antibiotics   Active Problems:   Acute metabolic encephalopathy, Acute CVA (cerebrovascular accident) ([place]) -Currently somnolent  however patient had received IV Ativan and Benadryl for MRI last night -MRI brain showed numerous diffuse bilateral foci of abnormal diffusion restriction in a pattern most suggestive of embolic shower but could also indicate watershed ischemia -Neurology consulted, will await their recommendations -A1c 6.0, LDL 91 -Will order 2D echo, serial neurochecks, PT OT SLP evaluation     Essential hypertension -BP currently elevated, allow permissive hypertension  - placed on hydralazine IV as with parameters  Acute RLE DVT -Venous Doppler shows have acute DVT in the peroneal veins in the left calf, will start on IV heparin per pharmacy    Lymphoma [facility]) -New diagnosis, has not been started on treatment yet.  Per patient's daughter, they were supposed to meet with her [facility] oncologist on Friday, 10/13 to discuss further management -GI consulted, discussed with Dr. Lorenso Courier, will evaluate for further recommendations   Hypokalemia -Replaced  Code Status: Full CODE STATUS DVT Prophylaxis:  enoxaparin (LOVENOX) injection 40 mg Start: 08/03/22 1000   Level of Care: Level of care: Telemetry Medical Family Communication: Updated patient's daughter at the bedside   Disposition Plan:      Remains inpatient appropriate: Changed to progressive floor given multiple medical comorbidities requiring close monitoring   Procedures:  MRI brain  Consultants:   Neurology  Oncology, Dr. Lorenso Courier   Antimicrobials:   Anti-infectives (From admission, onward)    Start     Dose/Rate Route Frequency Ordered Stop   08/03/22 1000  metroNIDAZOLE (FLAGYL) IVPB 500 mg        500 mg 100 mL/hr over 60 Minutes Intravenous Every 12 hours 08/02/22 2206     08/03/22 0600  ceFEPIme (MAXIPIME) 2 g in sodium chloride 0.9 % 100 mL IVPB        2 g 200 mL/hr over 30 Minutes Intravenous Every 8 hours 08/02/22 1918     08/03/22 0330  vancomycin (VANCOREADY) IVPB 750 mg/150 mL        750 mg 150 mL/hr over 60 Minutes  Intravenous Every 12 hours 08/02/22 1918     08/02/22 1930  vancomycin (VANCOREADY) IVPB 1500 mg/300 mL        1,500 mg 150 mL/hr over 120 Minutes Intravenous  Once 08/02/22 1915 08/02/22 2207   08/02/22 1915  ceFEPIme (MAXIPIME) 2 g in sodium chloride 0.9 % 100 mL IVPB  Status:  Discontinued        2 g 200 mL/hr over 30 Minutes Intravenous  Once 08/02/22 1900 08/02/22 1918   08/02/22 1915  metroNIDAZOLE (FLAGYL) IVPB 500 mg        500 mg 100 mL/hr over 60 Minutes Intravenous  Once 08/02/22 1900 08/02/22 2135   08/02/22 1915  vancomycin (VANCOCIN) IVPB 1000 mg/200 mL premix  Status:  Discontinued        1,000 mg 200 mL/hr over 60 Minutes Intravenous  Once 08/02/22 1900 08/02/22 1915          Medications  enoxaparin (LOVENOX) injection  40 mg Subcutaneous Q24H   sodium chloride flush  3 mL Intravenous Q12H      Subjective:   Sarah Walsh was seen and examined today.  Still somnolent, arousable, oriented to self and place.  Difficult to obtain review of system from the patient. Daughter at the bedside.  No active nausea or vomiting or pain.  Afebrile this morning  Objective:   Vitals:   08/03/22 0300 08/03/22 0600 08/03/22 0800 08/03/22 1000  BP: (!) 146/89 (!) 138/114  (!) 152/94  Pulse: (!) 108 (!) 113  99  Resp: (!) 23 18  (!) 29  Temp: 99.1 F (37.3 C)  98.9 F (37.2 C)   TempSrc:      SpO2: 91% 95%  92%  Weight:      Height:        Intake/Output Summary (Last 24 hours) at 08/03/2022 1037 Last data filed at 08/03/2022 0430 Gross per 24 hour  Intake 1150 ml  Output --  Net 1150 ml     Wt Readings from Last 3 Encounters:  08/02/22 72.6 kg  05/03/21 77.1 kg  04/21/21 77.9 kg     Exam General: Somnolent but arousable, oriented to self and place Cardiovascular: S1 S2 auscultated,  RRR Respiratory: CTA B Gastrointestinal: Soft, nontender, nondistended, + bowel sounds Ext: no pedal edema bilaterally Neuro: following commands intermittently, strength 5/5  in upper and lower extremities Psych: somnolent    Data Reviewed:  I have personally reviewed following labs    CBC Lab Results  Component Value Date   WBC 48.8 (H) 08/03/2022   RBC 3.67 (L) 08/03/2022   HGB 11.7 (L) 08/03/2022   HCT 35.1 (L) 08/03/2022   MCV 95.6 08/03/2022   MCH 31.9 08/03/2022   PLT 232 08/03/2022   MCHC 33.3 08/03/2022   RDW 15.7 (  H) 08/03/2022   LYMPHSABS 1.7 08/02/2022   MONOABS 0.8 08/02/2022   EOSABS 45.9 (H) 08/02/2022   BASOSABS 0.2 (H) 15/40/0867     Last metabolic panel Lab Results  Component Value Date   NA 134 (L) 08/03/2022   K 3.4 (L) 08/03/2022   CL 100 08/03/2022   CO2 23 08/03/2022   BUN 7 (L) 08/03/2022   CREATININE 0.66 08/03/2022   GLUCOSE 136 (H) 08/03/2022   GFRNONAA >60 08/03/2022   GFRAA >90 01/12/2012   CALCIUM 8.2 (L) 08/03/2022   PHOS 2.6 08/02/2022   PROT 7.5 08/02/2022   ALBUMIN 3.2 (L) 08/02/2022   BILITOT 0.9 08/02/2022   ALKPHOS 118 08/02/2022   AST 22 08/02/2022   ALT 14 08/02/2022   ANIONGAP 11 08/03/2022    CBG (last 3)  Recent Labs    08/02/22 1506  GLUCAP 149*      Coagulation Profile: Recent Labs  Lab 08/02/22 1902  INR 1.3*  1.2     Radiology Studies: I have personally reviewed the imaging studies  CT Angio Chest Pulmonary Embolism (PE) W or WO Contrast  Result Date: 08/03/2022 CLINICAL DATA:  Pulmonary embolism suspected, positive D-dimer. Undergoing malignancy workup with recent biopsy suggesting lymphoma. EXAM: CT ANGIOGRAPHY CHEST WITH CONTRAST TECHNIQUE: Multidetector CT imaging of the chest was performed using the standard protocol during bolus administration of intravenous contrast. Multiplanar CT image reconstructions and MIPs were obtained to evaluate the vascular anatomy. RADIATION DOSE REDUCTION: This exam was performed according to the departmental dose-optimization program which includes automated exposure control, adjustment of the mA and/or kV according to patient size  and/or use of iterative reconstruction technique. CONTRAST:  83m OMNIPAQUE IOHEXOL 350 MG/ML SOLN COMPARISON:  None Available. FINDINGS: Cardiovascular: The heart is borderline enlarged. There is no pericardial effusion. Scattered coronary artery calcifications are noted. There is atherosclerotic calcification of the aorta without evidence of aneurysm. Pulmonary trunk is normal in caliber. No pulmonary artery filling defect is seen. Examination is limited due to respiratory motion artifact and pulmonary nodules. Mediastinum/Nodes: Multiple enlarged lymph nodes are present in the mediastinum and hilar regions bilaterally. No axillary lymphadenopathy. Thyroid gland, trachea, and esophagus are within normal limits. Lungs/Pleura: Hazy regional ground-glass opacities are present in the lungs bilaterally. There are multiple scattered pulmonary nodules bilaterally, the largest in the right lower lobe measuring 3.7 cm, axial image 110 and in the left lower lobe measuring 4.1 cm, axial image 94. No effusion or pneumothorax. Upper Abdomen: Enlarged lymph nodes are present in the gastrohepatic ligament measuring up to 1.1 cm. No acute abnormality. Musculoskeletal: No acute or suspicious osseous abnormality. Review of the MIP images confirms the above findings. IMPRESSION: 1. No evidence of pulmonary embolism. 2. Multiple enlarged mediastinal and hilar lymph nodes and pulmonary nodules bilaterally measuring up to 4.1 cm, concerning for neoplastic process or lymphoma. Correlation with recent biopsy results is recommended. 3. Mild hazy geographic ground-glass opacities in the lungs, possible edema or air trapping. 4. Coronary artery calcifications. 5. Aortic atherosclerosis. Electronically Signed   By: LBrett FairyM.D.   On: 08/03/2022 01:10   MR BRAIN WO CONTRAST  Result Date: 08/02/2022 CLINICAL DATA:  Altered mental status EXAM: MRI HEAD WITHOUT CONTRAST TECHNIQUE: Multiplanar, multiecho pulse sequences of the brain  and surrounding structures were obtained without intravenous contrast. COMPARISON:  04/15/2013 FINDINGS: Brain: Numerous, diffuse bilateral foci of abnormal diffusion restriction in a pattern that is most suggestive of embolic shower but could also indicate watershed ischemia. No acute hemorrhage.  There is multifocal hyperintense T2-weighted signal within the white matter. Generalized volume loss. The midline structures are normal. Vascular: Major flow voids are preserved. Skull and upper cervical spine: Normal calvarium and skull base. Visualized upper cervical spine and soft tissues are normal. Sinuses/Orbits:No paranasal sinus fluid levels or advanced mucosal thickening. No mastoid or middle ear effusion. Normal orbits. IMPRESSION: 1. Numerous, diffuse bilateral foci of abnormal diffusion restriction in a pattern that is most suggestive of embolic shower but could also indicate watershed ischemia. 2. No acute hemorrhage or mass effect. Electronically Signed   By: Ulyses Jarred M.D.   On: 08/02/2022 23:57   CT Head Wo Contrast  Result Date: 08/02/2022 CLINICAL DATA:  Altered mental status, recent diagnosis of lymphoma EXAM: CT HEAD WITHOUT CONTRAST TECHNIQUE: Contiguous axial images were obtained from the base of the skull through the vertex without intravenous contrast. RADIATION DOSE REDUCTION: This exam was performed according to the departmental dose-optimization program which includes automated exposure control, adjustment of the mA and/or kV according to patient size and/or use of iterative reconstruction technique. COMPARISON:  MR brain done on 04/15/2013 FINDINGS: Brain: No acute intracranial findings are seen in noncontrast CT brain. There are no signs of bleeding within the cranium. Ventricles are not dilated. Cortical sulci are prominent. There is no focal edema or mass effect. Vascular: Unremarkable Skull: Unremarkable. Sinuses/Orbits: Unremarkable. Other: None. IMPRESSION: No acute intracranial  findings are seen in noncontrast CT brain. Atrophy. Electronically Signed   By: Elmer Picker M.D.   On: 08/02/2022 16:35   DG Chest 2 View  Result Date: 08/02/2022 CLINICAL DATA:  Cough, altered mental status x3 days EXAM: CHEST - 2 VIEW COMPARISON:  04/21/2021 FINDINGS: Cardiac size is within normal limits. Thoracic aorta is tortuous. There are nodular densities of varying sizes scattered throughout both lungs largest overlying the inferior right hilum measuring 2.8 cm. Findings suggest possible metastatic disease. Less likely possibility would be nodular appearing multifocal pneumonia. There is no pleural effusion or pneumothorax. Degenerative changes are noted in both shoulders. IMPRESSION: There are multiple nodular densities of varying sizes in both lungs suggesting pulmonary metastatic disease. Follow-up CT, PET-CT and biopsy as warranted should be considered. Please correlate with clinical history for any known primary malignancy. Electronically Signed   By: Elmer Picker M.D.   On: 08/02/2022 15:37       Lavarius Doughten M.D. [facility] 08/03/2022, 10:37 AM  Available via Epic secure chat 7am-7pm After 7 pm, please refer to night coverage provider listed on amion.

## 2022-08-03 NOTE — Progress Notes (Signed)
OT Cancellation Note  Patient Details Name: Sarah Walsh MRN: [id number] DOB: 12/13/1955   Cancelled Treatment:    Reason Eval/Treat Not Completed: Medical issues which prohibited therapy Pt found to have acute LLE DVT, started on heparin today. Per therapy protocol, will hold OOB attempts with therapy for 24 hours.  Layla Maw 08/03/2022, 1:09 PM

## 2022-08-03 NOTE — Progress Notes (Signed)
  Echocardiogram 2D Echocardiogram has been performed.  Sarah Walsh 08/03/2022, 12:44 PM

## 2022-08-03 NOTE — Consult Note (Addendum)
Neurology Consultation    Reason for Consult: strokes on MRI  CC: n/a, Altered mental status . Family reports gradually worsening mental status x 5 days  HISTORY OF PRESENT ILLNESS   HPI   This is a 66 year old female with past medical history of hypertension, hyperlipidemia, type 2 diabetes mellitus, vitamin D deficiency, statin intolerance, osteoarthritis, remote history of melanoma, and recently diagnosed lymphoma after discovery of right cervical adenopathy status post excision/biopsy (07/19/22)  I spoke with daughter, son, and ex-husband, who are at bedside.  Patient's issues began this past Friday, when she seemed "not herself", gradually worsening and prompting evaluation.  She would begin 1 task then walk into the other room without finishing it.  She usually watches 8 hours of TV a day, but has been disinterested in this reportedly.  She has been seen talking to herself, the content of this conversation being nonsensical.  Finally, she has been lethargic.  There is been no witnessed seizure-like activity nor does she have a history of this.  Recent syncopal or presyncopal episodes, although daughter brings up an episode where she "fainted" after eating a marijuana gummy about a month ago. Found to be tachycardic, hypertensive.   On later attending evaluation, daughter at bedside additionally mentions melanoma excision in June, complicated by MRSA which was treated with 14 days of oral antibiotics. Initially not improving, with particularly worsening rash, so after a few days started using a topical cream as well.  She is not 100% sure what the cream was but thinks it may have been triamcinolone  Found to have left lower extremity DVT for which heparin drip has been initiated.  Premorbid modified Rankin scale (mRS):  1-No significant post stroke disability and can perform usual duties with stroke symptoms  Unable to obtain due to altered mental status.  PAST MEDICAL HISTORY    Past  Medical History:  Past Medical History:  Diagnosis Date   Anemia    Arthritis    bilateral knees   Colon polyp 05/29/2019   Depression    on meds-situational anxiety   Diabetes mellitus without complication ([place])    on meds   Hyperlipemia    on meds   Hypertension    hx of    Osteopenia 04/23/2021   DEXA 03/2021: lowest T = -2.0, osteeopenia. Recheck 2 years. Ca/D    No family history on file. Family History  Problem Relation Age of Onset   Alzheimer's disease Mother    Diabetes Daughter    Healthy Daughter    Diabetes Father    Colon polyps Father    Healthy Daughter    Healthy Daughter    Healthy Son    Esophageal cancer Neg Hx    Rectal cancer Neg Hx    Stomach cancer Neg Hx    Colon cancer Neg Hx     Allergies:  Allergies  Allergen Reactions   Statins Other (See Comments)    myalgia Other reaction(s): Other (See Comments) Other Reaction(s): Other  myalgia Other Reaction(s): Other  myalgia    Codeine Itching and Other (See Comments)    Social History:   reports that she quit smoking about 38 years ago. Her smoking use included cigarettes. She has never used smokeless tobacco. She reports that she does not drink alcohol and does not use drugs.    Medications (Not in a hospital admission)   EXAMINATION    Current vital signs: Current vital signs: BP 128/69 (BP Location: Right Arm)   Pulse Marland Kitchen)  116   Temp (!) 100.6 F (38.1 C) (Oral)   Resp (!) 24   Ht '5\' 3"'$  (1.6 m)   Wt 72.6 kg   SpO2 94%   BMI 28.34 kg/m  Vital signs in last 24 hours: Temp:  [98.3 F (36.8 C)-100.6 F (38.1 C)] 100.6 F (38.1 C) (10/11 1629) Pulse Rate:  [97-116] 116 (10/11 1500) Resp:  [16-29] 24 (10/11 1629) BP: (126-172)/(69-114) 128/69 (10/11 1629) SpO2:  [91 %-96 %] 94 % (10/11 1500)   Examination:  GENERAL: sleeping, easy to arouse verbally, but markedly impaired attention span  HEENT: - Normocephalic and atraumatic, dry mm LUNGS - deferred to primary team  CV  - deferred to primary team  ABDOMEN - Soft, nontender, nondistended with normoactive BS Ext: warm, well perfused, intact peripheral pulses, no pedal edema  NEURO:  Mental Status: AA&Ox1 to person only  Language: speech is mildly dysarthric, unable to name, repeat, but comprehension appears relatively in tact. Attention span impaired, which limits exam, but cannot ascertain similarity between apple and orange.   Cranial Nerves:  II: PERRL. Visual fields full to confrontation via blink to threat , does not count fingers  III, IV, VI: EOM in tact. Eyelids elevate symmetrically. V: Sensation intact V1-3 symmetrically  VII: no facial asymmetry   VIII: hearing intact to voice IX, X: unable to assess  SN:[id number] shrug 5/5 and symmetrical  XII: unable to assess  Motor: Appears full strength on resisting examiner's positive movements, does not follow commands for confrontational strength testing, no obvious asymmetry Tone: is normal and bulk is normal DTRs: 2+ and symmetrical throughout but with ? Of adductor reflex left LE; toes downgoing BL  Sensation- difficult to assess due to mental status  Coordination: difficult to assess,  no overt ataxia in BLE./UE, no abnormal movements  Gait- deferred  NIHSS:  1 questions 1 commands 1 aphasia  ---------- 3   On later attending evaluation, patient has received Benadryl and is significantly more sleepy than for PA exam as documented above.  In the setting her NIH is slightly worse as she does not, for example, maintain her limbs antigravity, but her cranial nerves remain intact, she does protrude her tongue midline, and she uses all 4 extremities equally and moves equally to light noxious stimulation such as tickle   LABS   I have reviewed labs in epic and the results pertinent to this consultation are:  LABS Leukocytosis to 54.9, hyponatremia 134 (although baseline 130-132 seemingly), negative respiratory panel, ammonia level 29, DIC panel  with elevated D-dimer, elevated LDH 448, relatively unremarkable urinalysis, initially normal lactic acid but now with increasing levels, normal Pro-Cal, vitamin B12 level 1460, RPR negative, TSH within normal limits.  Lab Results  Component Value Date   CHOL 127 08/03/2022   HDL 17 (L) 08/03/2022   LDLCALC 91 08/03/2022   TRIG 97 08/03/2022   CHOLHDL 7.5 08/03/2022   Lab Results  Component Value Date   ALT 14 08/02/2022   AST 22 08/02/2022   ALKPHOS 118 08/02/2022   BILITOT 0.9 08/02/2022   Lab Results  Component Value Date   HGBA1C 6.0 (H) 08/03/2022   Lab Results  Component Value Date   WBC 48.8 (H) 08/03/2022   HGB 11.7 (L) 08/03/2022   HCT 35.1 (L) 08/03/2022   MCV 95.6 08/03/2022   PLT 232 08/03/2022   Lab Results  Component Value Date   VITAMINB12 1,416 (H) 08/02/2022   Lab Results  Component Value Date  FOLATE 9.4 11/06/2019   Lab Results  Component Value Date   NA 134 (L) 08/03/2022   K 3.4 (L) 08/03/2022   CL 100 08/03/2022   CO2 23 08/03/2022   DIAGNOSTIC IMAGING/PROCEDURES   CT head largely unrevealing aside from generalized atrophy  MRI brain without contrast MCA/PCA, ACA/MCA, and bilateral subcortical diffusion restricting hyperdensities as well as midline lesion that appears a bit more atypical, advanced atrophy.  Some of these hypodensities also evident on FLAIR imaging CT angio without evidence of pulmonary embolism but with evidence of mediastinal and hilar lymph node and pulmonary nodes bilaterally measuring up to 4.1 cm as well as hazy geographic groundglass opacities, aortic atherosclerosis  ECHO:  1. Left ventricular ejection fraction, by estimation, is 60 to 65%. The left ventricle has normal function. The left ventricle has no regional  wall motion abnormalities. There is mild concentric left ventricular hypertrophy. Left ventricular diastolic parameters are consistent with Grade I diastolic dysfunction (impaired relaxation).   2. Right  ventricular systolic function is normal. The right ventricular size is normal. Tricuspid regurgitation signal is inadequate for assessing PA pressure.   3. The mitral valve is normal in structure. No evidence of mitral valve regurgitation. No evidence of mitral stenosis.   4. The aortic valve is tricuspid. There is mild calcification of the aortic valve. Aortic valve regurgitation is not visualized. No aortic stenosis is present.   5. The inferior vena cava is normal in size with greater than 50% respiratory variability, suggesting right atrial pressure of 3 mmHg.  ASSESSMENT/PLAN    Assessment:  This is a 66 year old female with a recent diagnosis of lymphoma 13 days ago presenting now with a 5-day history of gradually worsening alteration in mental status, impairment of her attention span, nonsensical speech, and lethargy.  She has a nonfocal neurological examination within the limits of her impaired attention span/alteration in mental status.  Clinically, her presentation correlates with diagnosis of toxic-metabolic encephalopathy secondary to sepsis for which work-up is ongoing and she is on broad-spectrum antibiotics.  Given the burden of stroke this also may be contributing to her mental status  The appearance of these raises suspicion for embolic showering event versus hypercoagulable state, less likely hypoperfusion event as there is no report of any events concerning for hypoperfusion  Impression: Toxic metabolic encephalopathy, watershed ischemic infarctions of MCA/PCA and ACA/MCA and subcortical areas bilaterally, left lower extremity DVT  Etiology: unknown at this juncture--> cardio-embolic (query septic emboli) versus hypercoagulability of malignancy with both venous and arterial thrombi with consideration PFO despite age in setting of LLE DVT as well.  Given additional history provided to attending MD about recent MRSA infection of her arm treated with oral antibiotics, this raises  the possibility of endocarditis (though her intermittent chills and white count are certainly also attributable to her lymphoma) -- therefore recommend holding off on anticoagulation for now due to high risk of intracerebral hemorrhage from potential septic emboli.  Recommendations: -obtain CTA head and neck to ascertain stenoses -obtain echocardiogram with bubble study to ascertain cardiac thrombus, PFO (iso LLE DVT) -Recommend TEE to evaluate more sensitively for endocarditis given negative TTE -continued management sepsis per primary team  -HIV assay pending  -Heparin drip canceled by attending neurohospitalist pending further evaluation for endocarditis -Appreciate management of comorbidities per primary team -Stroke team to follow  #Other Stroke Risk Factors: -a1c 6, no benefit to tight glycemic control acutely (<180-200 goal) but will need follow up with PCP for this -LDL 91, but  previously statin intolerant, can be addressed later since less likely small vessel disease risk factors are etiology of this infarction   Case discussed and patient seen by Dr. Curly Shores.  -- Lennie Hummer, PA-C Neurology Department   **This documentation was dictated using [place] and may contain inadvertent errors **  Attending Neurologist's note:  I personally saw this patient, gathering history, performing a full neurologic examination, reviewing relevant labs, personally reviewing relevant imaging including head CT, MRI brain (which was reviewed with family at bedside), and formulated the assessment and plan, adding the note above for completeness and clarity to accurately reflect my thoughts  CDI query for toxic/metabolic encephalopathy. Toxic/metabolic encephalopathy was suspected based on patient's exam findings and suspected to be secondary to recent infection with mild hyponatremia and hypokalemia

## 2022-08-03 NOTE — ED Notes (Signed)
This RN and NT to provide full linen change and changed pt

## 2022-08-03 NOTE — Progress Notes (Signed)
Lower extremity venous bilateral study completed.  Preliminary results relayed to Rai, MD.  See CV Proc for preliminary results report.   Mileydi Milsap, RDMS, RVT  

## 2022-08-03 NOTE — Progress Notes (Signed)
ANTICOAGULATION CONSULT NOTE - Initial Consult  Pharmacy Consult for heparin  Indication: DVT  Allergies  Allergen Reactions   Statins Other (See Comments)    myalgia Other reaction(s): Other (See Comments) Other Reaction(s): Other  myalgia Other Reaction(s): Other  myalgia    Codeine Itching and Other (See Comments)    Patient Measurements: Height: '5\' 3"'$  (160 cm) Weight: 72.6 kg (160 lb) IBW/kg (Calculated) : 52.4 Heparin Dosing Weight: 67.6kg   Vital Signs: Temp: 98.9 F (37.2 C) (10/11 0800) Temp Source: Oral (10/11 0025) BP: 152/94 (10/11 1000) Pulse Rate: 99 (10/11 1000)  Labs: Recent Labs    08/02/22 1351 08/02/22 1400 08/02/22 1902 08/03/22 0410  HGB 13.6 13.6  --  11.7*  HCT 39.3 39.7  --  35.1*  PLT 284 296 278 232  APTT  --   --  23*  28  --   LABPROT  --   --  15.8*  15.5*  --   INR  --   --  1.3*  1.2  --   CREATININE 0.75  --   --  0.66    Estimated Creatinine Clearance: 64.3 mL/min (by C-G formula based on SCr of 0.66 mg/dL).   Medical History: Past Medical History:  Diagnosis Date   Anemia    Arthritis    bilateral knees   Colon polyp 05/29/2019   Depression    on meds-situational anxiety   Diabetes mellitus without complication ([place])    on meds   Hyperlipemia    on meds   Hypertension    hx of    Osteopenia 04/23/2021   DEXA 03/2021: lowest T = -2.0, osteeopenia. Recheck 2 years. Ca/D   Assessment: Patient is admitted for potential sepsis workup, acute DVT found on lower extremity US. No anticoagulation prior to admission. Patient had prophylactic lovenox ordered but has not been given. Hemoglobin stable at 11.7, renal function is within normal limits.   Goal of Therapy:  Heparin level 0.3-0.7 units/ml Monitor platelets by anticoagulation protocol: Yes   Plan:  Give 4000 units bolus x 1 Start heparin infusion at 1000 units/hr Check anti-Xa level in 8 hours and daily while on heparin Continue to monitor H&H and  platelets  Ventura Sellers 08/03/2022,11:05 AM

## 2022-08-03 NOTE — Progress Notes (Signed)
  Echocardiogram 2D Echocardiogram has been performed.  Sarah Walsh 08/03/2022, 12:43 PM

## 2022-08-03 NOTE — Progress Notes (Addendum)
ANTICOAGULATION CONSULT NOTE - Initial Consult  Pharmacy Consult for heparin  Indication: DVT  Allergies  Allergen Reactions   Statins Other (See Comments)    myalgia Other reaction(s): Other (See Comments) Other Reaction(s): Other  myalgia Other Reaction(s): Other  myalgia    Codeine Itching and Other (See Comments)    Patient Measurements: Height: '5\' 3"'$  (160 cm) Weight: 72.6 kg (160 lb) IBW/kg (Calculated) : 52.4 Heparin Dosing Weight: 67.6kg   Vital Signs: Temp: 100.6 F (38.1 C) (10/11 1629) Temp Source: Oral (10/11 1629) BP: 128/69 (10/11 1629) Pulse Rate: 116 (10/11 1500)  Labs: Recent Labs    08/02/22 1351 08/02/22 1400 08/02/22 1902 08/03/22 0410 08/03/22 1832  HGB 13.6 13.6  --  11.7*  --   HCT 39.3 39.7  --  35.1*  --   PLT 284 296 278 232  --   APTT  --   --  23*  28  --   --   LABPROT  --   --  15.8*  15.5*  --   --   INR  --   --  1.3*  1.2  --   --   HEPARINUNFRC  --   --   --   --  <0.10*  CREATININE 0.75  --   --  0.66  --      Estimated Creatinine Clearance: 64.3 mL/min (by C-G formula based on SCr of 0.66 mg/dL).   Medical History: Past Medical History:  Diagnosis Date   Anemia    Arthritis    bilateral knees   Colon polyp 05/29/2019   Depression    on meds-situational anxiety   Diabetes mellitus without complication ([place])    on meds   Hyperlipemia    on meds   Hypertension    hx of    Osteopenia 04/23/2021   DEXA 03/2021: lowest T = -2.0, osteeopenia. Recheck 2 years. Ca/D   Assessment: Patient is admitted for potential sepsis workup, acute DVT found on lower extremity US. No anticoagulation prior to admission. Patient had prophylactic lovenox ordered but has not been given. Hemoglobin stable at 11.7, renal function is within normal limits.   First HL <0.10 (subtherapeutic)  No issues with infusion/line and no s/sx bleeding per RN   Goal of Therapy:  Heparin level 0.3-0.7 units/ml Monitor platelets by anticoagulation  protocol: Yes   Plan:  Give 2000 units bolus x 1 Increase heparin infusion to 1200 units/hr Check anti-Xa level in 6 hours and daily while on heparin Continue to monitor H&H and platelets  Wilson Singer, PharmD Clinical Pharmacist 08/03/2022 7:35 PM  --- Addendum ---  Heparin infusion discontinued per Neuro team d/t concern for septic emboli and high risk of hemorrhagic conversion.

## 2022-08-04 ENCOUNTER — Inpatient Hospital Stay (HOSPITAL_COMMUNITY): Payer: Medicare HMO

## 2022-08-04 DIAGNOSIS — I639 Cerebral infarction, unspecified: Secondary | ICD-10-CM | POA: Diagnosis not present

## 2022-08-04 DIAGNOSIS — I82401 Acute embolism and thrombosis of unspecified deep veins of right lower extremity: Secondary | ICD-10-CM | POA: Diagnosis not present

## 2022-08-04 DIAGNOSIS — A4102 Sepsis due to Methicillin resistant Staphylococcus aureus: Secondary | ICD-10-CM

## 2022-08-04 DIAGNOSIS — Z0389 Encounter for observation for other suspected diseases and conditions ruled out: Secondary | ICD-10-CM | POA: Diagnosis not present

## 2022-08-04 LAB — BASIC METABOLIC PANEL
Anion gap: 12 (ref 5–15)
BUN: 8 mg/dL (ref 8–23)
CO2: 20 mmol/L — ABNORMAL LOW (ref 22–32)
Calcium: 8.1 mg/dL — ABNORMAL LOW (ref 8.9–10.3)
Chloride: 101 mmol/L (ref 98–111)
Creatinine, Ser: 0.63 mg/dL (ref 0.44–1.00)
GFR, Estimated: >60 mL/min (ref >60)
Glucose, Bld: 125 mg/dL — ABNORMAL HIGH (ref 70–99)
Potassium: 3.2 mmol/L — ABNORMAL LOW (ref 3.5–5.1)
Sodium: 133 mmol/L — ABNORMAL LOW (ref 135–145)

## 2022-08-04 LAB — PROCALCITONIN: Procalcitonin: <0.1 ng/mL

## 2022-08-04 LAB — CBC
HCT: 35 % — ABNORMAL LOW (ref 36.0–46.0)
Hemoglobin: 12.2 g/dL (ref 12.0–15.0)
MCH: 32.1 pg (ref 26.0–34.0)
MCHC: 34.9 g/dL (ref 30.0–36.0)
MCV: 92.1 fL (ref 80.0–100.0)
Platelets: 215 10*3/uL (ref 150–400)
RBC: 3.8 MIL/uL — ABNORMAL LOW (ref 3.87–5.11)
RDW: 15.8 % — ABNORMAL HIGH (ref 11.5–15.5)
WBC: 48.2 10*3/uL — ABNORMAL HIGH (ref 4.0–10.5)
nRBC: 0 % (ref 0.0–0.2)

## 2022-08-04 LAB — PATHOLOGIST SMEAR REVIEW

## 2022-08-04 LAB — HIV ANTIBODY (ROUTINE TESTING W REFLEX): HIV Screen 4th Generation wRfx: NONREACTIVE

## 2022-08-04 MED ORDER — MAGNESIUM SULFATE 2 GM/50ML IV SOLN
2.0000 g | Freq: Once | INTRAVENOUS | Status: AC
  Administered 2022-08-04: 2 g via INTRAVENOUS
  Filled 2022-08-04: qty 50

## 2022-08-04 MED ORDER — POTASSIUM CHLORIDE CRYS ER 20 MEQ PO TBCR
40.0000 meq | EXTENDED_RELEASE_TABLET | Freq: Once | ORAL | Status: AC
  Administered 2022-08-04: 40 meq via ORAL
  Filled 2022-08-04: qty 2

## 2022-08-04 MED ORDER — SODIUM CHLORIDE 0.9 % IV SOLN: INTRAVENOUS | Status: DC

## 2022-08-04 MED ORDER — HEPARIN (PORCINE) 25000 UT/250ML-% IV SOLN
1200.0000 [IU]/h | INTRAVENOUS | Status: DC
  Administered 2022-08-04: 1200 [IU]/h via INTRAVENOUS
  Filled 2022-08-04: qty 250

## 2022-08-04 MED ORDER — IOHEXOL 350 MG/ML SOLN
50.0000 mL | Freq: Once | INTRAVENOUS | Status: AC | PRN
  Administered 2022-08-04: 50 mL via INTRAVENOUS

## 2022-08-04 MED ORDER — IOHEXOL 350 MG/ML SOLN
60.0000 mL | Freq: Once | INTRAVENOUS | Status: AC | PRN
  Administered 2022-08-04: 60 mL via INTRAVENOUS

## 2022-08-04 MED ORDER — STROKE: EARLY STAGES OF RECOVERY BOOK
Freq: Once | Status: AC
  Filled 2022-08-04: qty 1

## 2022-08-04 NOTE — Plan of Care (Signed)

## 2022-08-04 NOTE — Evaluation (Signed)
Clinical/Bedside Swallow Evaluation Patient Details  Name: Sarah Walsh MRN: [id number] Date of Birth: August 19, 1956  Today's Date: 08/04/2022 Time: SLP Start Time (ACUTE ONLY): 16 SLP Stop Time (ACUTE ONLY): 0935 SLP Time Calculation (min) (ACUTE ONLY): 20 min  Past Medical History:  Past Medical History:  Diagnosis Date   Anemia    Arthritis    bilateral knees   Colon polyp 05/29/2019   Depression    on meds-situational anxiety   Diabetes mellitus without complication ([place])    on meds   Hyperlipemia    on meds   Hypertension    hx of    Osteopenia 04/23/2021   DEXA 03/2021: lowest T = -2.0, osteeopenia. Recheck 2 years. Ca/D   Past Surgical History:  Past Surgical History:  Procedure Laterality Date   ABDOMINAL HYSTERECTOMY     BREAST IMPLANT REMOVAL Bilateral 12/10/2020   Procedure: REMOVAL BREAST IMPLANTS;  Surgeon: Wallace Going, DO;  Location: [facility];  Service: Plastics;  Laterality: Bilateral;  2 hours please   CAPSULECTOMY Bilateral 12/10/2020   Procedure: CAPSULECTOMY;  Surgeon: Wallace Going, DO;  Location: [facility];  Service: Plastics;  Laterality: Bilateral;   CESAREAN SECTION     4 times   COLONOSCOPY  2020   Gupta-MAC-tics/hems/multiple polyps   POLYPECTOMY  2020   multiple polyps   TOTAL KNEE ARTHROPLASTY Left 05/03/2021   Procedure: LEFT TOTAL KNEE ARTHROPLASTY;  Surgeon: Leandrew Koyanagi, MD;  Location: [facility];  Service: Orthopedics;  Laterality: Left;   WISDOM TOOTH EXTRACTION     HPI:  Sarah Walsh presenting with worsening lethargy, confusion, anorexia, and gait instability. She is admitted with sepsis and acute CVA. MRI revealed numerous diffuse bilateral foci of abnormal diffusion restriction most suggestive of embolic shower (could also indicate watershed ischemia). PMH includes: newly dx lymphoma (has not started tx yet), HTN, HLD, DMII, vitamin D deficiency, OA, remote h/o melanoma    Assessment  / Plan / Recommendation  Clinical Impression  Sarah Walsh presents with what appears to be a primarily cognitive dysphagia, marked by decreased awareness and initiation of intake off of straw and spoon. With cueing provided, she is able to get boluses into her mouth, and she orally prepares them and swallows them more automatically and without overt s/s of aspiration. Suspect that current mentation is what may limit her overall intake and potentially increase risk even episodically for reduced airway protection. Recommend starting with full liquid diet (thin liquids) with full supervision. Hoepful for good prognosis to advance to more solid intake as she becomes more alert and mentation begins to improve. SLP Visit Diagnosis: Dysphagia, unspecified (R13.10)    Aspiration Risk  Mild aspiration risk;Risk for inadequate nutrition/hydration    Diet Recommendation Thin liquid (full liquid diet)   Liquid Administration via: Cup;Straw Medication Administration: Crushed with puree Supervision: Staff to assist with self feeding;Full supervision/cueing for compensatory strategies Compensations: Minimize environmental distractions;Slow rate;Small sips/bites Postural Changes: Seated upright at 90 degrees    Other  Recommendations Oral Care Recommendations: Oral care BID Other Recommendations: Have oral suction available    Recommendations for follow up therapy are one component of a multi-disciplinary discharge planning process, led by the attending physician.  Recommendations may be updated based on patient status, additional functional criteria and insurance authorization.  Follow up Recommendations Acute inpatient rehab (3hours/day)      Assistance Recommended at Discharge Frequent or constant Supervision/Assistance  Functional Status Assessment Patient has had  a recent decline in their functional status and demonstrates the ability to make significant improvements in function in a reasonable and predictable  amount of time.  Frequency and Duration min 2x/week  2 weeks       Prognosis Prognosis for Safe Diet Advancement: Good Barriers to Reach Goals: Cognitive deficits      Swallow Study   General HPI: Sarah Walsh presenting with worsening lethargy, confusion, anorexia, and gait instability. She is admitted with sepsis and acute CVA. MRI revealed numerous diffuse bilateral foci of abnormal diffusion restriction most suggestive of embolic shower (could also indicate watershed ischemia). PMH includes: newly dx lymphoma (has not started tx yet), HTN, HLD, DMII, vitamin D deficiency, OA, remote h/o melanoma Type of Study: Bedside Swallow Evaluation Previous Swallow Assessment: none in chart Diet Prior to this Study: Regular;Thin liquids Temperature Spikes Noted: Yes (100.6) Respiratory Status: Nasal cannula History of Recent Intubation: No Behavior/Cognition: Lethargic/Drowsy;Requires cueing Oral Cavity Assessment: Within Functional Limits Oral Care Completed by SLP: No Oral Cavity - Dentition: Missing dentition;Other (Comment) (Sarah Walsh wears partial dentures, which were offered by daughter but Sarah Walsh declined) Self-Feeding Abilities: Total assist Patient Positioning: Upright in bed Baseline Vocal Quality: Normal    Oral/Motor/Sensory Function Overall Oral Motor/Sensory Function: Generalized oral weakness (not following commands consistently but without overt focal weakness)   Ice Chips Ice chips: Within functional limits Presentation: Spoon   Thin Liquid Thin Liquid: Impaired Presentation: Cup;Straw Oral Phase Impairments: Poor awareness of bolus    Nectar Thick Nectar Thick Liquid: Not tested   Honey Thick Honey Thick Liquid: Not tested   Puree Puree: Impaired Presentation: Spoon Oral Phase Impairments: Poor awareness of bolus   Solid     Solid: Impaired Presentation: Spoon Oral Phase Impairments: Poor awareness of bolus      Osie Bond., M.A. [facility] Office [phone]  Secure chat preferred  08/04/2022,10:16 AM

## 2022-08-04 NOTE — Progress Notes (Signed)
OT Cancellation Note  Patient Details Name: Sarah Walsh MRN: [id number] DOB: October 10, 1956   Cancelled Treatment:    Reason Eval/Treat Not Completed: Patient at procedure or test/ unavailable  Hadley Pen 08/04/2022, 4:04 PM

## 2022-08-04 NOTE — Progress Notes (Signed)
PT Cancellation Note  Patient Details Name: Sarah Walsh MRN: [id number] DOB: [birth year]   Cancelled Treatment:    Reason Eval/Treat Not Completed: Medical issues which prohibited therapy. PT holding until pt is adequately anticoagulated. Per discussion with Dr. Eliseo Squires may be best to hold until Saturday for evaluation as pt is scheduled for a TEE Friday afternoon. PT will plan on returning Saturday. If earlier evaluation is needed please reach out to PT staff.   Zenaida Niece 08/04/2022, 1:10 PM

## 2022-08-04 NOTE — Progress Notes (Addendum)
[facility]                                                                              Sarah Walsh, is a 66 y.o. female, DOB - 10-10-1956, MOQ:[id number] Admit date - 08/02/2022    Outpatient Primary MD for the patient is Sarah Margarita, DO  LOS - 1  days  Chief Complaint  Patient presents with   Altered Mental Status       Brief summary   Patient is a 66 year old female with HTN, HLP, diabetes mellitus type 2, undergoing malignancy work-up through [facility] and found to have lymphoma, not yet started on treatment, presented to ED with lethargy, confusion, anorexia, gait instability.  CTA chest showed no evidence of PE, multiple enlarged mediastinal and hilar lymphadenopathy and pulmonary nodules bilaterally measuring up to 4.1 cm concerning for neoplastic process or lymphoma.  Mildly hazy geographic groundglass opacities in the lungs.  MRI positive for watershed infarct  Assessment & Plan    Sepsis [facility]) -Patient met sepsis criteria on admission with tachycardia, fevers, leukocytosis, lactic acidosis, source unclear.  No UTI, no pneumonia however has lymphoma, recently diagnosed -Follow blood cultures, broad-spectrum IV antibiotics ?fever from DVT    Acute metabolic encephalopathy, Acute CVA (cerebrovascular accident) ([place]) -MRI brain showed numerous diffuse bilateral foci of abnormal diffusion restriction in a pattern most suggestive of embolic shower but could also indicate watershed ischemia -Neurology consulted: TEE and CTA pending-- they stopped her heparin gtt -A1c 6.0, LDL 91 -PT/OT/SLP   ADDENDUM: CTA abnormal with ? Venus sinus thrombus-- will get CT venogram-- wiill need anticoagulation if positive -called cards for TEE- planned for 2 pm 10/13    Essential hypertension -BP currently elevated, allow permissive hypertension  - placed on hydralazine IV as with parameters  Acute RLE DVT -Venous Doppler shows have acute DVT in the peroneal veins in the  left calf -will treat with ASA after discussion with neurology -CTA negative for PE    Lymphoma [facility]) -New diagnosis, has not been started on treatment yet.  Per patient's daughter, they were supposed to meet with her [facility] oncologist on Friday, 10/13 to discuss further management -GI consulted, discussed with Dr. Lorenso Courier:  -- Patient's fevers may be related to tumor fever, though at this time do agree with empiric antibiotic therapy and collecting of culture data. --Given her weakened state she does not appear to be in good condition for starting chemotherapy at this time.  Hopefully she will make substantial recoveries in her functional status and be able to start treatment as planned. --No clear indication for pulse steroids at this time as the patient is thought to beseptic.  - At time of discharge sure patient has follow-up with [facility] oncology -have requested records from [facility] -including pathology   Hypokalemia -Replaced  Code Status: Full CODE STATUS    Level of Care: Level of care: Progressive Family Communication: multiple at bedside   Disposition Plan:      Remains inpatient appropriate     Consultants:   Neurology  Oncology, Dr. Lorenso Courier      Medications  potassium chloride  40 mEq Oral Once  sodium chloride flush  3 mL Intravenous Q12H      Subjective:   Sarah Walsh remains confused but more alert than yesterday  Objective:   Vitals:   08/04/22 0157 08/04/22 0529 08/04/22 0735 08/04/22 0800  BP: 131/86 (!) 150/89 (!) 145/85 (!) 145/79  Pulse: (!) 105 (!) 103 96 97  Resp: $Remo'20  15 16  'HXbhN$ Temp: 97.9 F (36.6 C) 98.9 F (37.2 C) 98.2 F (36.8 C) 98.2 F (36.8 C)  TempSrc: Oral Axillary Oral Axillary  SpO2: 95% 93% 93% 95%  Weight:      Height:        Intake/Output Summary (Last 24 hours) at 08/04/2022 1013 Last data filed at 08/04/2022 0539 Gross per 24 hour  Intake 2043.1 ml  Output 800 ml  Net 1243.1 ml     Wt Readings from Last 3 Encounters:   08/02/22 72.6 kg  05/03/21 77.1 kg  04/21/21 77.9 kg     Exam  General: Appearance:     Overweight female in no acute distress     Lungs:     On Gr[place], respirations unlabored  Heart:    Normal heart rate.   MS:   All extremities are intact.   Neurologic:   Awake and alert, oriented to person but not place/time      Data Reviewed:  I have personally reviewed following labs    CBC Lab Results  Component Value Date   WBC 48.2 (H) 08/04/2022   RBC 3.80 (L) 08/04/2022   HGB 12.2 08/04/2022   HCT 35.0 (L) 08/04/2022   MCV 92.1 08/04/2022   MCH 32.1 08/04/2022   PLT 215 08/04/2022   MCHC 34.9 08/04/2022   RDW 15.8 (H) 08/04/2022   LYMPHSABS 1.7 08/02/2022   MONOABS 0.8 08/02/2022   EOSABS 45.9 (H) 08/02/2022   BASOSABS 0.2 (H) 76/73/4193     Last metabolic panel Lab Results  Component Value Date   NA 133 (L) 08/04/2022   K 3.2 (L) 08/04/2022   CL 101 08/04/2022   CO2 20 (L) 08/04/2022   BUN 8 08/04/2022   CREATININE 0.63 08/04/2022   GLUCOSE 125 (H) 08/04/2022   GFRNONAA >60 08/04/2022   GFRAA >90 01/12/2012   CALCIUM 8.1 (L) 08/04/2022   PHOS 2.6 08/02/2022   PROT 7.5 08/02/2022   ALBUMIN 3.2 (L) 08/02/2022   BILITOT 0.9 08/02/2022   ALKPHOS 118 08/02/2022   AST 22 08/02/2022   ALT 14 08/02/2022   ANIONGAP 12 08/04/2022    CBG (last 3)  Recent Labs    08/02/22 1506  GLUCAP 149*      Coagulation Profile: Recent Labs  Lab 08/02/22 1902  INR 1.3*  1.2     Radiology Studies: I have personally reviewed the imaging studies  VAS [place] LOWER EXTREMITY VENOUS (DVT)  Result Date: 08/03/2022  Lower Venous DVT Study [name]  Date of Exam:   08/03/2022 Medical Rec #: [id number]      Accession #:    [id number] Date of Birth: [birth year]      Patient Gender: F Patient Age:   31 years Exam Location:  [facility] Procedure:      VAS [place] LOWER EXTREMITY VENOUS (DVT) Referring Phys: TIMOTHY OPYD  --------------------------------------------------------------------------------  Indications: Elevated d-dimer, lymphoma.  Comparison       06-15-2021 Prior left lower extremity venous was negative for Study:           DVT. Performing Technologist: Darlin Coco RDMS,  RVT  Examination Guidelines: A complete evaluation includes B-mode imaging, spectral Doppler, color Doppler, and power Doppler as needed of all accessible portions of each vessel. Bilateral testing is considered an integral part of a complete examination. Limited examinations for reoccurring indications may be performed as noted. The reflux portion of the exam is performed with the patient in reverse Trendelenburg.  +---------+---------------+---------+-----------+----------+--------------+ RIGHT    CompressibilityPhasicitySpontaneityPropertiesThrombus Aging +---------+---------------+---------+-----------+----------+--------------+ CFV      Full           Yes      Yes                                 +---------+---------------+---------+-----------+----------+--------------+ SFJ      Full                                                        +---------+---------------+---------+-----------+----------+--------------+ FV Prox  Full                                                        +---------+---------------+---------+-----------+----------+--------------+ FV Mid   Full                                                        +---------+---------------+---------+-----------+----------+--------------+ FV DistalFull                                                        +---------+---------------+---------+-----------+----------+--------------+ PFV      Full                                                        +---------+---------------+---------+-----------+----------+--------------+ POP      Full           Yes      Yes                                  +---------+---------------+---------+-----------+----------+--------------+ PTV      Full                                                        +---------+---------------+---------+-----------+----------+--------------+ PERO     Full                                                        +---------+---------------+---------+-----------+----------+--------------+  Gastroc  Full                                                        +---------+---------------+---------+-----------+----------+--------------+   +---------+---------------+---------+-----------+----------+--------------+ LEFT     CompressibilityPhasicitySpontaneityPropertiesThrombus Aging +---------+---------------+---------+-----------+----------+--------------+ CFV      Full           Yes      Yes                                 +---------+---------------+---------+-----------+----------+--------------+ SFJ      Full                                                        +---------+---------------+---------+-----------+----------+--------------+ FV Prox  Full                                                        +---------+---------------+---------+-----------+----------+--------------+ FV Mid   Full                                                        +---------+---------------+---------+-----------+----------+--------------+ FV DistalFull                                                        +---------+---------------+---------+-----------+----------+--------------+ PFV      Full                                                        +---------+---------------+---------+-----------+----------+--------------+ POP      Full           Yes      Yes                                 +---------+---------------+---------+-----------+----------+--------------+ PTV      Full                                                         +---------+---------------+---------+-----------+----------+--------------+ PERO     Partial        No       No                   Acute          +---------+---------------+---------+-----------+----------+--------------+  Gastroc  Full                                                        +---------+---------------+---------+-----------+----------+--------------+     Summary: RIGHT: - There is no evidence of deep vein thrombosis in the lower extremity.  - No cystic structure found in the popliteal fossa.  - Ultrasound characteristics of enlarged lymph nodes are noted in the groin.  LEFT: - Findings consistent with acute deep vein thrombosis involving the left peroneal veins.  - No cystic structure found in the popliteal fossa.  - Ultrasound characteristics of enlarged lymph nodes noted in the groin.  *See table(s) above for measurements and observations. Electronically signed by Harold Barban MD on 08/03/2022 at 9:30:05 PM.    Final    ECHOCARDIOGRAM COMPLETE  Result Date: 08/03/2022    ECHOCARDIOGRAM REPORT   Patient Name:   Sarah Walsh Date of Exam: 08/03/2022 Medical Rec #:  [id number]     Height:       63.0 in Accession #:    [id number]    Weight:       160.0 lb Date of Birth:  1956-10-24     BSA:          1.759 m Patient Age:    32 years      BP:           152/94 mmHg Patient Gender: F             HR:           110 bpm. Exam Location:  Inpatient Procedure: 2D Echo Indications:    stroke  History:        Patient has prior history of Echocardiogram examinations and                 Patient has no prior history of Echocardiogram examinations.                 Sepsis; Risk Factors:Hypertension.  Sonographer:    Johny Chess RDCS Referring Phys: [id number] RIPUDEEP K RAI  Sonographer Comments: Image acquisition challenging due to uncooperative patient. IMPRESSIONS  1. Left ventricular ejection fraction, by estimation, is 60 to 65%. The left ventricle has normal function. The left ventricle has no  regional wall motion abnormalities. There is mild concentric left ventricular hypertrophy. Left ventricular diastolic parameters are consistent with Grade I diastolic dysfunction (impaired relaxation).  2. Right ventricular systolic function is normal. The right ventricular size is normal. Tricuspid regurgitation signal is inadequate for assessing PA pressure.  3. The mitral valve is normal in structure. No evidence of mitral valve regurgitation. No evidence of mitral stenosis.  4. The aortic valve is tricuspid. There is mild calcification of the aortic valve. Aortic valve regurgitation is not visualized. No aortic stenosis is present.  5. The inferior vena cava is normal in size with greater than 50% respiratory variability, suggesting right atrial pressure of 3 mmHg. FINDINGS  Left Ventricle: Left ventricular ejection fraction, by estimation, is 60 to 65%. The left ventricle has normal function. The left ventricle has no regional wall motion abnormalities. The left ventricular internal cavity size was normal in size. There is  mild concentric left ventricular hypertrophy. Left ventricular diastolic parameters are consistent with Grade I diastolic dysfunction (impaired relaxation). Right Ventricle: The right ventricular  size is normal. No increase in right ventricular wall thickness. Right ventricular systolic function is normal. Tricuspid regurgitation signal is inadequate for assessing PA pressure. Left Atrium: Left atrial size was normal in size. Right Atrium: Right atrial size was normal in size. Pericardium: There is no evidence of pericardial effusion. Mitral Valve: The mitral valve is normal in structure. There is mild calcification of the mitral valve leaflet(s). Mild mitral annular calcification. No evidence of mitral valve regurgitation. No evidence of mitral valve stenosis. Tricuspid Valve: The tricuspid valve is normal in structure. Tricuspid valve regurgitation is not demonstrated. Aortic Valve: The  aortic valve is tricuspid. There is mild calcification of the aortic valve. Aortic valve regurgitation is not visualized. No aortic stenosis is present. Pulmonic Valve: The pulmonic valve was normal in structure. Pulmonic valve regurgitation is not visualized. Aorta: The aortic root is normal in size and structure. Venous: The inferior vena cava is normal in size with greater than 50% respiratory variability, suggesting right atrial pressure of 3 mmHg. IAS/Shunts: No atrial level shunt detected by color flow Doppler.  LEFT VENTRICLE PLAX 2D LVIDd:         4.20 cm LVIDs:         2.40 cm LV PW:         0.90 cm LV IVS:        1.20 cm LVOT diam:     1.60 cm LV SV:         48 LV SV Index:   27 LVOT Area:     2.01 cm  RIGHT VENTRICLE             IVC RV S prime:     20.30 cm/s  IVC diam: 1.30 cm TAPSE (M-mode): 1.6 cm LEFT ATRIUM             Index        RIGHT ATRIUM          Index LA diam:        3.40 cm 1.93 cm/m   RA Area:     9.64 cm LA Vol (A2C):   26.3 ml 14.95 ml/m  RA Volume:   19.10 ml 10.86 ml/m LA Vol (A4C):   27.5 ml 15.64 ml/m LA Biplane Vol: 27.9 ml 15.86 ml/m  AORTIC VALVE LVOT Vmax:   152.00 cm/s LVOT Vmean:  105.000 cm/s LVOT VTI:    0.238 m  AORTA Ao Root diam: 3.00 cm MV E velocity: 79.60 cm/s MV A velocity: 141.00 cm/s  SHUNTS MV E/A ratio:  0.56         Systemic VTI:  0.24 m                             Systemic Diam: 1.60 cm Dalton McleanMD Electronically signed by Franki Monte Signature Date/Time: 08/03/2022/2:41:22 PM    Final    CT Angio Chest Pulmonary Embolism (PE) W or WO Contrast  Result Date: 08/03/2022 CLINICAL DATA:  Pulmonary embolism suspected, positive D-dimer. Undergoing malignancy workup with recent biopsy suggesting lymphoma. EXAM: CT ANGIOGRAPHY CHEST WITH CONTRAST TECHNIQUE: Multidetector CT imaging of the chest was performed using the standard protocol during bolus administration of intravenous contrast. Multiplanar CT image reconstructions and MIPs were obtained to  evaluate the vascular anatomy. RADIATION DOSE REDUCTION: This exam was performed according to the departmental dose-optimization program which includes automated exposure control, adjustment of the mA and/or kV according to patient size and/or use of  iterative reconstruction technique. CONTRAST:  66mL OMNIPAQUE IOHEXOL 350 MG/ML SOLN COMPARISON:  None Available. FINDINGS: Cardiovascular: The heart is borderline enlarged. There is no pericardial effusion. Scattered coronary artery calcifications are noted. There is atherosclerotic calcification of the aorta without evidence of aneurysm. Pulmonary trunk is normal in caliber. No pulmonary artery filling defect is seen. Examination is limited due to respiratory motion artifact and pulmonary nodules. Mediastinum/Nodes: Multiple enlarged lymph nodes are present in the mediastinum and hilar regions bilaterally. No axillary lymphadenopathy. Thyroid gland, trachea, and esophagus are within normal limits. Lungs/Pleura: Hazy regional ground-glass opacities are present in the lungs bilaterally. There are multiple scattered pulmonary nodules bilaterally, the largest in the right lower lobe measuring 3.7 cm, axial image 110 and in the left lower lobe measuring 4.1 cm, axial image 94. No effusion or pneumothorax. Upper Abdomen: Enlarged lymph nodes are present in the gastrohepatic ligament measuring up to 1.1 cm. No acute abnormality. Musculoskeletal: No acute or suspicious osseous abnormality. Review of the MIP images confirms the above findings. IMPRESSION: 1. No evidence of pulmonary embolism. 2. Multiple enlarged mediastinal and hilar lymph nodes and pulmonary nodules bilaterally measuring up to 4.1 cm, concerning for neoplastic process or lymphoma. Correlation with recent biopsy results is recommended. 3. Mild hazy geographic ground-glass opacities in the lungs, possible edema or air trapping. 4. Coronary artery calcifications. 5. Aortic atherosclerosis. Electronically Signed    By: Brett Fairy M.D.   On: 08/03/2022 01:10   MR BRAIN WO CONTRAST  Result Date: 08/02/2022 CLINICAL DATA:  Altered mental status EXAM: MRI HEAD WITHOUT CONTRAST TECHNIQUE: Multiplanar, multiecho pulse sequences of the brain and surrounding structures were obtained without intravenous contrast. COMPARISON:  04/15/2013 FINDINGS: Brain: Numerous, diffuse bilateral foci of abnormal diffusion restriction in a pattern that is most suggestive of embolic shower but could also indicate watershed ischemia. No acute hemorrhage. There is multifocal hyperintense T2-weighted signal within the white matter. Generalized volume loss. The midline structures are normal. Vascular: Major flow voids are preserved. Skull and upper cervical spine: Normal calvarium and skull base. Visualized upper cervical spine and soft tissues are normal. Sinuses/Orbits:No paranasal sinus fluid levels or advanced mucosal thickening. No mastoid or middle ear effusion. Normal orbits. IMPRESSION: 1. Numerous, diffuse bilateral foci of abnormal diffusion restriction in a pattern that is most suggestive of embolic shower but could also indicate watershed ischemia. 2. No acute hemorrhage or mass effect. Electronically Signed   By: Ulyses Jarred M.D.   On: 08/02/2022 23:57   CT Head Wo Contrast  Result Date: 08/02/2022 CLINICAL DATA:  Altered mental status, recent diagnosis of lymphoma EXAM: CT HEAD WITHOUT CONTRAST TECHNIQUE: Contiguous axial images were obtained from the base of the skull through the vertex without intravenous contrast. RADIATION DOSE REDUCTION: This exam was performed according to the departmental dose-optimization program which includes automated exposure control, adjustment of the mA and/or kV according to patient size and/or use of iterative reconstruction technique. COMPARISON:  MR brain done on 04/15/2013 FINDINGS: Brain: No acute intracranial findings are seen in noncontrast CT brain. There are no signs of bleeding within  the cranium. Ventricles are not dilated. Cortical sulci are prominent. There is no focal edema or mass effect. Vascular: Unremarkable Skull: Unremarkable. Sinuses/Orbits: Unremarkable. Other: None. IMPRESSION: No acute intracranial findings are seen in noncontrast CT brain. Atrophy. Electronically Signed   By: Elmer Picker M.D.   On: 08/02/2022 16:35   DG Chest 2 View  Result Date: 08/02/2022 CLINICAL DATA:  Cough, altered mental status x3 days  EXAM: CHEST - 2 VIEW COMPARISON:  04/21/2021 FINDINGS: Cardiac size is within normal limits. Thoracic aorta is tortuous. There are nodular densities of varying sizes scattered throughout both lungs largest overlying the inferior right hilum measuring 2.8 cm. Findings suggest possible metastatic disease. Less likely possibility would be nodular appearing multifocal pneumonia. There is no pleural effusion or pneumothorax. Degenerative changes are noted in both shoulders. IMPRESSION: There are multiple nodular densities of varying sizes in both lungs suggesting pulmonary metastatic disease. Follow-up CT, PET-CT and biopsy as warranted should be considered. Please correlate with clinical history for any known primary malignancy. Electronically Signed   By: Elmer Picker M.D.   On: 08/02/2022 15:37       Geradine Girt DO [facility] 08/04/2022, 10:13 AM  Available via Epic secure chat 7am-7pm After 7 pm, please refer to night coverage provider listed on amion.

## 2022-08-04 NOTE — Progress Notes (Signed)
   08/04/22 1957  Assess: MEWS Score  Temp 98.7 F (37.1 C)  BP (!) 151/100  MAP (mmHg) 115  Pulse Rate (!) 107  ECG Heart Rate (!) 107  Resp (!) 36  SpO2 94 %  Assess: MEWS Score  MEWS Temp 0  MEWS Systolic 0  MEWS Pulse 1  MEWS RR 3  MEWS LOC 0  MEWS Score 4  MEWS Score Color Red  Assess: if the MEWS score is Yellow or Red  Were vital signs taken at a resting state? Yes  Focused Assessment No change from prior assessment  Does the patient meet 2 or more of the SIRS criteria? No  MEWS guidelines implemented *See Row Information* Yes  Treat  MEWS Interventions Administered prn meds/treatments  Pain Scale 0-10  Pain Score 0  Take Vital Signs  Increase Vital Sign Frequency  Red: Q 1hr X 4 then Q 4hr X 4, if remains red, continue Q 4hrs  Escalate  MEWS: Escalate Red: discuss with charge nurse/RN and provider, consider discussing with RRT  Notify: Charge Nurse/RN  Name of Charge Nurse/RN Notified Shannel, RN  Date Charge Nurse/RN Notified 08/04/22  Time Charge Nurse/RN Notified 2130  Notify: Provider  Provider Name/Title E. Bridgett Larsson  Date Provider Notified 08/04/22  Time Provider Notified 2200  Method of Notification Page  Notification Reason Change in status  Provider response No new orders  Date of Provider Response 08/04/22  Time of Provider Response 2300  Assess: SIRS CRITERIA  SIRS Temperature  0  SIRS Pulse 1  SIRS Respirations  1  SIRS WBC 1  SIRS Score Sum  3

## 2022-08-04 NOTE — Evaluation (Signed)
Speech Language Pathology Evaluation Patient Details Name: Sarah Walsh MRN: [id number] DOB: October 29, 1955 Today's Date: 08/04/2022 Time: 6378-5885 SLP Time Calculation (min) (ACUTE ONLY): 23 min  Problem List:  Patient Active Problem List   Diagnosis Date Noted   DVT (deep venous thrombosis) ([place]) 02/77/4128   Acute metabolic encephalopathy 78/67/6720   Acute CVA (cerebrovascular accident) ([place]) 08/03/2022   Sepsis ([place]) 08/03/2022   SIRS (systemic inflammatory response syndrome) ([place]) 08/02/2022   Acute encephalopathy 08/02/2022   Lung nodules 08/02/2022   Lymphoma ([place]) 08/02/2022   Positive D dimer 08/02/2022   Status post total left knee replacement 05/03/2021   Osteopenia 04/23/2021   Primary osteoarthritis of left knee 02/09/2021   Breast implant rupture 10/13/2020   Osteoarthritis of spine with radiculopathy, lumbar region 07/15/2020   Myalgia due to statin 02/24/2020   Reactive depression 11/06/2019   Colon polyp 05/29/2019   Controlled type 2 diabetes mellitus without complication, without long-term current use of insulin ([place]) 03/22/2019   Patellofemoral pain syndrome of both knees 07/04/2018   Lower extremity edema 03/03/2017   Vitamin D deficiency 11/23/2015   Combined hyperlipidemia associated with type 2 diabetes mellitus ([place]) 10/22/2015   Primary osteoarthritis of both knees 10/22/2015   Statin intolerance 10/22/2015   Essential hypertension 11/05/2008   Past Medical History:  Past Medical History:  Diagnosis Date   Anemia    Arthritis    bilateral knees   Colon polyp 05/29/2019   Depression    on meds-situational anxiety   Diabetes mellitus without complication ([place])    on meds   Hyperlipemia    on meds   Hypertension    hx of    Osteopenia 04/23/2021   DEXA 03/2021: lowest T = -2.0, osteeopenia. Recheck 2 years. Ca/D   Past Surgical History:  Past Surgical History:  Procedure Laterality Date   ABDOMINAL HYSTERECTOMY     BREAST IMPLANT REMOVAL  Bilateral 12/10/2020   Procedure: REMOVAL BREAST IMPLANTS;  Surgeon: Wallace Going, DO;  Location: [facility];  Service: Plastics;  Laterality: Bilateral;  2 hours please   CAPSULECTOMY Bilateral 12/10/2020   Procedure: CAPSULECTOMY;  Surgeon: Wallace Going, DO;  Location: [facility];  Service: Plastics;  Laterality: Bilateral;   CESAREAN SECTION     4 times   COLONOSCOPY  2020   Gupta-MAC-tics/hems/multiple polyps   POLYPECTOMY  2020   multiple polyps   TOTAL KNEE ARTHROPLASTY Left 05/03/2021   Procedure: LEFT TOTAL KNEE ARTHROPLASTY;  Surgeon: Leandrew Koyanagi, MD;  Location: [facility];  Service: Orthopedics;  Laterality: Left;   WISDOM TOOTH EXTRACTION     HPI:  Pt is a 66 yo female presenting with worsening lethargy, confusion, anorexia, and gait instability. She is admitted with sepsis and acute CVA. MRI revealed numerous diffuse bilateral foci of abnormal diffusion restriction most suggestive of embolic shower (could also indicate watershed ischemia). PMH includes: newly dx lymphoma (has not started tx yet), HTN, HLD, DMII, vitamin D deficiency, OA, remote h/o melanoma   Assessment / Plan / Recommendation Clinical Impression  Pt is drowsy today, requiring frequent cues to awaken and sustain attention for brief moments. This impacts other cognitive skill sets as well, including her simple problem solving, immediate recall, and command following. She is oriented to preson only, although did not know her last name at first (but did upon repeat questioning). At times during this eval she is also somewhat impulsive and trying to get up, showing reduced safety awareness. Family  reports that she is typically very independent at home, with this being quite a drastic change from her baseline. Question impact of possible underlying infection as well as acute neurological deficits (medical w/u also still pending). Family is very supportive and able to provide 24/7  supervision upon return home. Recommend SLP f/u acutely and at discharge to maximize cognition and safety.    SLP Assessment  SLP Recommendation/Assessment: Patient needs continued Speech [facility] SLP Visit Diagnosis: Cognitive communication deficit (R41.841)    Recommendations for follow up therapy are one component of a multi-disciplinary discharge planning process, led by the attending physician.  Recommendations may be updated based on patient status, additional functional criteria and insurance authorization.    Follow Up Recommendations  Acute inpatient rehab (3hours/day)    Assistance Recommended at Discharge  Frequent or constant Supervision/Assistance  Functional Status Assessment Patient has had a recent decline in their functional status and demonstrates the ability to make significant improvements in function in a reasonable and predictable amount of time.  Frequency and Duration min 2x/week  2 weeks      SLP Evaluation Cognition  Overall Cognitive Status: Impaired/Different from baseline Arousal/Alertness: Lethargic Orientation Level: Oriented to person Attention: Sustained Sustained Attention: Impaired Sustained Attention Impairment: Verbal basic;Functional basic Memory: Impaired Memory Impairment: Decreased recall of new information Awareness: Impaired Awareness Impairment: Emergent impairment Problem Solving: Impaired Problem Solving Impairment: Verbal basic Safety/Judgment: Impaired       Comprehension  Auditory Comprehension Overall Auditory Comprehension: Impaired Yes/No Questions: Impaired Basic Immediate Environment Questions: 25-49% accurate Commands: Impaired One Step Basic Commands: 25-49% accurate Interfering Components: Attention;Other (comment) (alertness)    Expression Expression Primary Mode of Expression: Verbal Verbal Expression Overall Verbal Expression:  (limited assessment - needs more at f/u)   Oral / Motor  Oral  Motor/Sensory Function Overall Oral Motor/Sensory Function: Generalized oral weakness (not following commands consistently but without overt focal weakness) Motor Speech Overall Motor Speech:  (limited assessment - needs more at f/u)            Osie Bond., M.A. [place] Office [phone]  Secure chat preferred  08/04/2022, 10:24 AM

## 2022-08-04 NOTE — Progress Notes (Signed)
   08/04/22 0157  Assess: MEWS Score  Temp 97.9 F (36.6 C)  BP 131/86  MAP (mmHg) 101  Pulse Rate (!) 105  ECG Heart Rate (!) 105  Level of Consciousness Alert  SpO2 95 %  O2 Device Nasal Cannula  Assess: MEWS Score  MEWS Temp 0  MEWS Systolic 0  MEWS Pulse 1  MEWS RR 1  MEWS LOC 0  MEWS Score 2  MEWS Score Color Yellow  Assess: if the MEWS score is Yellow or Red  Were vital signs taken at a resting state? Yes  Focused Assessment No change from prior assessment  Does the patient meet 2 or more of the SIRS criteria? No  MEWS guidelines implemented *See Row Information* Yes  Treat  MEWS Interventions Escalated (See documentation below)  Take Vital Signs  Increase Vital Sign Frequency  Yellow: Q 2hr X 2 then Q 4hr X 2, if remains yellow, continue Q 4hrs  Escalate  MEWS: Escalate Yellow: discuss with charge nurse/RN and consider discussing with provider and RRT  Notify: Charge Nurse/RN  Name of Charge Nurse/RN Notified Shannell, RN  Date Charge Nurse/RN Notified 08/04/22  Time Charge Nurse/RN Notified 0231  Document  Patient Outcome Not stable and remains on department  Progress note created (see row info) Yes  Assess: SIRS CRITERIA  SIRS Temperature  0  SIRS Pulse 1  SIRS Respirations  1  SIRS WBC 1  SIRS Score Sum  3

## 2022-08-04 NOTE — Progress Notes (Signed)
[facility] for heparin  Indication: DVT  Allergies  Allergen Reactions   Statins Other (See Comments)    myalgia Other reaction(s): Other (See Comments) Other Reaction(s): Other  myalgia Other Reaction(s): Other  myalgia    Codeine Itching and Other (See Comments)    Patient Measurements: Height: '5\' 3"'$  (160 cm) Weight: 72.6 kg (160 lb) IBW/kg (Calculated) : 52.4 Heparin Dosing Weight: 67.6kg   Vital Signs: Temp: 98.2 F (36.8 C) (10/12 0800) Temp Source: Axillary (10/12 0800) BP: 145/79 (10/12 0800) Pulse Rate: 97 (10/12 0800)  Labs: Recent Labs    08/02/22 1351 08/02/22 1400 08/02/22 1902 08/03/22 0410 08/03/22 1832 08/04/22 0140  HGB 13.6 13.6  --  11.7*  --  12.2  HCT 39.3 39.7  --  35.1*  --  35.0*  PLT 284 296 278 232  --  215  APTT  --   --  23*  28  --   --   --   LABPROT  --   --  15.8*  15.5*  --   --   --   INR  --   --  1.3*  1.2  --   --   --   HEPARINUNFRC  --   --   --   --  <0.10*  --   CREATININE 0.75  --   --  0.66  --  0.63     Estimated Creatinine Clearance: 64.3 mL/min (by C-G formula based on SCr of 0.63 mg/dL).   Medical History: Past Medical History:  Diagnosis Date   Anemia    Arthritis    bilateral knees   Colon polyp 05/29/2019   Depression    on meds-situational anxiety   Diabetes mellitus without complication ([place])    on meds   Hyperlipemia    on meds   Hypertension    hx of    Osteopenia 04/23/2021   DEXA 03/2021: lowest T = -2.0, osteeopenia. Recheck 2 years. Ca/D   Assessment: 28 yoF admitted with CVA and acute DVT. CT chest negative PE. Pt initially started on IV heparin which was held 10/11 overnight with concerns for hemorrhagic conversion risk with CVA. Attending MD discussed with neurology this am - will resume IV heparin with no bolus. CTA head with possible sinus thrombus.   Goal of Therapy:  Heparin level 0.3-0.5 units/ml Monitor platelets by anticoagulation  protocol: Yes   Plan:  Resume heparin 1200 units/h Check heparin level in 6h  Arrie Senate, PharmD, Uncertain, [facility] Clinical Pharmacist [phone] Please check AMION for all [place] numbers 08/04/2022

## 2022-08-04 NOTE — H&P (View-Only) (Signed)
Neurology Progress Note   S:// Patients family is at the bedside. They stated she started to have mental states changes  5 days ago . However she was able to communicate with the family.Her daughter is at the bed side and states her mom had a skin melanoma removed last month , She then developed an infection and  wound culture was positive for MRSA. She was then placed on oral antibiotics.  Patient's issues began this past Friday, when she seemed "not herself", gradually worsening and prompting evaluation.  She would begin 1 task then walk into the other room without finishing it.  She usually watches 8 hours of TV a day, but has been disinterested in this reportedly.  She has been seen talking to herself, the content of this conversation being nonsensical. Her appetite is also very poor. They brought her  to [facility] ED due to being lethargic. Her daughter also reported that her mom has recently been diagnosed with Lymphoma. She has her first oncology appointment  next week  @ [facility] This morning she is very lethargic and sleepy. She is not participating with her family or examiner. Patient at this time will open her eyes to examiners voice and answering questions with yes/no She is po Heparin gtt for left lower DVT She has no history of stroke in the past.  Patient @ this time is globally weak    O:// Current vital signs: BP (!) 150/98   Pulse (!) 106   Temp 99.3 F (37.4 C) (Axillary)   Resp 20   Ht '5\' 3"'$  (1.6 m)   Wt 72.6 kg   SpO2 93%   BMI 28.34 kg/m  Vital signs in last 24 hours: Temp:  [97.9 F (36.6 C)-100.6 F (38.1 C)] 99.3 F (37.4 C) (10/12 1248) Pulse Rate:  [96-106] 106 (10/12 1500) Resp:  [15-24] 20 (10/12 1500) BP: (128-150)/(69-98) 150/98 (10/12 1500) SpO2:  [92 %-96 %] 93 % (10/12 1500) GENERAL: Awake, alert in NAD HEENT: - Normocephalic and atraumatic, dry mm, no LN++, no Thyromegally LUNGS - Clear to auscultation bilaterally with no wheezes CV - S1S2 RRR, no  m/r/g, equal pulses bilaterally. ABDOMEN - Soft, nontender, nondistended with normoactive BS Ext: warm, well perfused, intact peripheral pulses, noedema NEURO:  Mental Status: AA&Ox2, She is drowsy but will open her eyes and needs a lot of ques from examiner. She did identify her husband and daughter @ the bedside She will follow some very simple commands. She did respond appropriately when examiner pulled her blankets  and responded leave me alone and I need that. She will not participate fully. Speech is mostly nonfluent but with cues is able to speak without dysarthria.  Will not speak long sentences.    Cranial Nerves: (Patient during this exam will not participate 100%) she closes her eyes during exam PERRL 34mmm/brisk.  UTA EOMI UTA  visual fields full, no facial asymmetry,UTA facial sensation, normal  sternocleidomastoid and trapezius muscle strength. Motor: Moves all extremities against gravity but with poor effort in all extremities.  No apparent focal weakness Tone: is normal and bulk is normal Sensation- UTA Coordination: UTA Gait- deferred Due too patients condition   Medications  Current Facility-Administered Medications:    0.9 %  sodium chloride infusion, , Intravenous, Continuous, Vann, Jessica U, DO, Last Rate: 75 mL/hr at 08/04/22 1244, New Bag at 08/04/22 1244   0.9 %  sodium chloride infusion, , Intravenous, Continuous, JMargie Billet PA-C   acetaminophen (TYLENOL) tablet 650  mg, 650 mg, Oral, Q6H PRN, 650 mg at 08/03/22 1650 **OR** acetaminophen (TYLENOL) suppository 650 mg, 650 mg, Rectal, Q6H PRN, Opyd, Ilene Qua, MD   ceFEPIme (MAXIPIME) 2 g in sodium chloride 0.9 % 100 mL IVPB, 2 g, Intravenous, Q8H, Opyd, Ilene Qua, MD, Last Rate: 200 mL/hr at 08/04/22 1349, 2 g at 08/04/22 1349   diphenhydrAMINE (BENADRYL) injection 25 mg, 25 mg, Intravenous, Q6H PRN, Opyd, Ilene Qua, MD, 25 mg at 08/03/22 1837   heparin ADULT infusion 100 units/mL (25000 units/226m), 1,200  Units/hr, Intravenous, Continuous, BEinar Grad RPH, Last Rate: 12 mL/hr at 08/04/22 1345, 1,200 Units/hr at 08/04/22 1345   hydrALAZINE (APRESOLINE) injection 10 mg, 10 mg, Intravenous, Q6H PRN, Rai, Ripudeep K, MD   metroNIDAZOLE (FLAGYL) IVPB 500 mg, 500 mg, Intravenous, Q12H, Opyd, TIlene Qua MD, Last Rate: 100 mL/hr at 08/04/22 1118, 500 mg at 08/04/22 1118   ondansetron (ZOFRAN) tablet 4 mg, 4 mg, Oral, Q6H PRN **OR** ondansetron (ZOFRAN) injection 4 mg, 4 mg, Intravenous, Q6H PRN, Opyd, Timothy S, MD   senna-docusate (Senokot-S) tablet 1 tablet, 1 tablet, Oral, QHS PRN, Opyd, TIlene Qua MD   sodium chloride flush (NS) 0.9 % injection 3 mL, 3 mL, Intravenous, Q12H, Opyd, Timothy S, MD, 3 mL at 08/04/22 1119   vancomycin (VANCOREADY) IVPB 750 mg/150 mL, 750 mg, Intravenous, Q12H, Opyd, TIlene Qua MD, Last Rate: 150 mL/hr at 08/04/22 0402, 750 mg at 08/04/22 0402 Labs CBC    Component Value Date/Time   WBC 48.2 (H) 08/04/2022 0140   RBC 3.80 (L) 08/04/2022 0140   HGB 12.2 08/04/2022 0140   HCT 35.0 (L) 08/04/2022 0140   PLT 215 08/04/2022 0140   MCV 92.1 08/04/2022 0140   MCH 32.1 08/04/2022 0140   MCHC 34.9 08/04/2022 0140   RDW 15.8 (H) 08/04/2022 0140   LYMPHSABS 1.7 08/02/2022 1400   MONOABS 0.8 08/02/2022 1400   EOSABS 45.9 (H) 08/02/2022 1400   BASOSABS 0.2 (H) 08/02/2022 1400    CMP     Component Value Date/Time   NA 133 (L) 08/04/2022 0140   K 3.2 (L) 08/04/2022 0140   CL 101 08/04/2022 0140   CO2 20 (L) 08/04/2022 0140   GLUCOSE 125 (H) 08/04/2022 0140   BUN 8 08/04/2022 0140   CREATININE 0.63 08/04/2022 0140   CALCIUM 8.1 (L) 08/04/2022 0140   PROT 7.5 08/02/2022 1351   ALBUMIN 3.2 (L) 08/02/2022 1351   AST 22 08/02/2022 1351   ALT 14 08/02/2022 1351   ALKPHOS 118 08/02/2022 1351   BILITOT 0.9 08/02/2022 1351   GFRNONAA >60 08/04/2022 0140   GFRAA >90 01/12/2012 0149    glycosylated hemoglobin  Lipid Panel     Component Value Date/Time   CHOL  127 08/03/2022 0905   TRIG 97 08/03/2022 0905   HDL 17 (L) 08/03/2022 0905   CHOLHDL 7.5 08/03/2022 0905   VLDL 19 08/03/2022 0905   LDLCALC 91 08/03/2022 0905     Imaging I have reviewed images in epic and the results pertinent to this consultation are:  CT-scan of the brain: 08/02/22 COMPARISON:  MR brain done on 04/15/2013   FINDINGS: Brain: No acute intracranial findings are seen in noncontrast CT brain. There are no signs of bleeding within the cranium. Ventricles are not dilated. Cortical sulci are prominent. There is no focal edema or mass effect.   Vascular: Unremarkable   Skull: Unremarkable.   Sinuses/Orbits: Unremarkable.   Other: None.   IMPRESSION: No acute intracranial  findings are seen in noncontrast CT brain. Atrophy. MRI examination of the brain  CTA Head/Neck: 08/04/22  IMPRESSION: 1. Asymmetrically decreased opacification of the right-sided transverse and sigmoid sinus is worrisome for dural venous sinus thrombosis. Recommend further evaluation with a CT venogram. 2. No intracranial LVO or significant arterial stenosis in the neck. 3. No acute intracranial process. 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 4. Multiple enlarged lymph nodes in the neck, mediastinum, and left axilla, which are compatible with clinically suspected lymphoma. 5. Partially imaged pleural effusions and right sided pulmonary nodule, better seen on prior CTA chest.  MRI brain w/o Contrast: 08/04/22 IMPRESSION: 1. Numerous, diffuse bilateral foci of abnormal diffusion restriction in a pattern that is most suggestive of embolic shower but could also indicate watershed ischemia. 2. No acute hemorrhage or mass effect.   Echocardiogram: IMPRESSIONS Left ventricular ejection fraction, by estimation, is 60 to  65%. The left ventricle has normal function. The left ventricle has no regional wall motion abnormalities. There is mild concentric left ventricular hypertrophy. Left ventricular diastolic parameters are consistent with Grade I diastolic dysfunction (impaired relaxation). 1. Right ventricular systolic function is normal. The right ventricular size is normal. Tricuspid regurgitation signal is inadequate for assessing PA pressure. 2. The mitral valve is normal in structure. No evidence of mitral valve regurgitation. No evidence of mitral stenosis. 3. The aortic valve is tricuspid. There is mild calcification of the aortic valve. Aortic valve regurgitation is not visualized. No aortic stenosis is present. 4. The inferior vena cava is normal in size with greater than 50% respiratory variability, suggesting right atrial pressure of 3 mmHg.    Assessment:  This is a 66 year old female with a recent diagnosis of lymphoma 13 days ago presenting now with a 5-day history of gradually worsening alteration in mental status, impairment of her attention span, nonsensical speech, and lethargy.  She has a nonfocal neurological examination within the limits of her impaired attention span/alteration in mental status. Her presentation  @ this time correlates with toxic/infectious encephalopathy which can be due to her recent MRSA infection. MRI Brain multiple small embolic cortical and subcortical infarcts@ MCA/PCA and ACA/MCA and subcortical areas bilateral . Her Etiology likely from central cardiac source possibly due to suspected bacterial endocarditis versus paradoxical embolism given peroneal vein DVT on lower extremity Dopplers.  Recommendations: Recommendations: -Frequent neuro checks -Recommend TEE to evaluate more sensitively for endocarditis given negative TTE -continued management sepsis per primary team  -HIV assay pending  -Delirium precaution Fall Precaution -Continue antibiotics per  primary team - There is a concern for right sided transverse and sigmoid sinus dural venous sinus thrombosis -Obtain CTV  -Appreciate management of comorbidities per primary team Aspirin alone for peroneal vein DVT at the present time and will hold off on anticoagulation given concern for bacterial endocarditis and high risk for bleeding   #Other Stroke Risk Factors: -a1c 6, no benefit to tight glycemic control acutely (<180-200 goal) but will need follow up with PCP for this -LDL 91, but previously statin intolerant, can be addressed later since less likely small vessel disease risk factors are etiology of this infarction   -- Elenora Gamma, PA-C [facility]  STROKE MD NOTE :  I have personally obtained history,examined this patient, reviewed notes, independently viewed imaging studies, participated in medical decision making and plan of care.ROS  completed by me personally and pertinent positives fully documented  I have made any additions or clarifications directly to the above note. Agree with note above.  Patient presented with confusion and altered mental status which has been progressively getting worse for the last 5 days.  Neurological exam is consistent with delirium without focal weakness.  She had recent MRSA infection following melanoma removal which was treated with oral antibiotics.  MRI scan shows multiple bilateral anterior and posterior circulation cortical and subcortical embolic infarcts likely from a central cardiac source.  Strong suspicion for endocarditis given recent history of infection.  Lower extremity venous Doppler is positive for peroneal vein thrombosis so paradoxical embolism is also on the possibility if she has a PFO.  Recommend continue aspirin for now.  CT angiogram has raised question of cerebral venous sinus thrombosis so we will evaluate this with CT venogram and if confirmed may need to consider IV heparin but will hold off at the moment due to  concern for bacterial endocarditis.  Long discussion with patient's daughter, husband and son at the bedside and answered questions.  Discussed with Dr. Eliseo Squires.  Greater than 50% time during this 50-minute visit was spent in counseling and coordination of care about her multiple embolic strokes and discussion about evaluation treatment and answering questions and discussion with care team.  Antony Contras, MD Medical Director [place] Pager: [phone] 08/04/2022 5:25 PM

## 2022-08-04 NOTE — Plan of Care (Signed)
  Problem: Respiratory: Goal: Ability to maintain adequate ventilation will improve Outcome: Progressing   Problem: Clinical Measurements: Goal: Cardiovascular complication will be avoided Outcome: Progressing   Problem: Elimination: Goal: Will not experience complications related to urinary retention Outcome: Progressing   Problem: Pain Managment: Goal: General experience of comfort will improve Outcome: Progressing   Problem: Safety: Goal: Ability to remain free from injury will improve Outcome: Progressing   Problem: Skin Integrity: Goal: Risk for impaired skin integrity will decrease Outcome: Progressing

## 2022-08-04 NOTE — Plan of Care (Signed)
  Problem: Fluid Volume: Goal: Hemodynamic stability will improve 08/04/2022 1602 by Drucie Ip I, RN Outcome: Progressing 08/04/2022 1601 by Drucie Ip I, RN Outcome: Progressing   Problem: Clinical Measurements: Goal: Diagnostic test results will improve 08/04/2022 1602 by Drucie Ip I, RN Outcome: Progressing 08/04/2022 1601 by Drucie Ip I, RN Outcome: Progressing Goal: Signs and symptoms of infection will decrease 08/04/2022 1602 by Drucie Ip I, RN Outcome: Progressing 08/04/2022 1601 by Drucie Ip I, RN Outcome: Progressing   Problem: Respiratory: Goal: Ability to maintain adequate ventilation will improve 08/04/2022 1602 by Drucie Ip I, RN Outcome: Progressing 08/04/2022 1601 by Drucie Ip I, RN Outcome: Progressing   Problem: Education: Goal: Knowledge of General Education information will improve Description: Including pain rating scale, medication(s)/side effects and non-pharmacologic comfort measures 08/04/2022 1602 by Drucie Ip I, RN Outcome: Progressing 08/04/2022 1601 by Drucie Ip I, RN Outcome: Progressing   Problem: Health Behavior/Discharge Planning: Goal: Ability to manage health-related needs will improve 08/04/2022 1602 by Drucie Ip I, RN Outcome: Progressing 08/04/2022 1601 by Drucie Ip I, RN Outcome: Progressing   Problem: Clinical Measurements: Goal: Ability to maintain clinical measurements within normal limits will improve 08/04/2022 1602 by Drucie Ip I, RN Outcome: Progressing 08/04/2022 1601 by Drucie Ip I, RN Outcome: Progressing Goal: Will remain free from infection 08/04/2022 1602 by Drucie Ip I, RN Outcome: Progressing 08/04/2022 1601 by Drucie Ip I, RN Outcome: Progressing Goal: Diagnostic test results will improve 08/04/2022 1602 by Drucie Ip I, RN Outcome: Progressing 08/04/2022 1601 by Drucie Ip I, RN Outcome: Progressing Goal: Respiratory complications will improve 08/04/2022 1602 by Drucie Ip I, RN Outcome: Progressing 08/04/2022 1601 by Drucie Ip I, RN Outcome: Progressing Goal: Cardiovascular complication will be avoided 08/04/2022 1602 by Drucie Ip I, RN Outcome: Progressing 08/04/2022 1601 by Drucie Ip I, RN Outcome: Progressing   Problem: Activity: Goal: Risk for activity intolerance will decrease 08/04/2022 1602 by Drucie Ip I, RN Outcome: Progressing 08/04/2022 1601 by Drucie Ip I, RN Outcome: Progressing   Problem: Nutrition: Goal: Adequate nutrition will be maintained 08/04/2022 1602 by Drucie Ip I, RN Outcome: Progressing 08/04/2022 1601 by Drucie Ip I, RN Outcome: Progressing   Problem: Coping: Goal: Level of anxiety will decrease 08/04/2022 1602 by Drucie Ip I, RN Outcome: Progressing 08/04/2022 1601 by Drucie Ip I, RN Outcome: Progressing   Problem: Elimination: Goal: Will not experience complications related to bowel motility 08/04/2022 1602 by Drucie Ip I, RN Outcome: Progressing 08/04/2022 1601 by Drucie Ip I, RN Outcome: Progressing Goal: Will not experience complications related to urinary retention 08/04/2022 1602 by Drucie Ip I, RN Outcome: Progressing 08/04/2022 1601 by Drucie Ip I, RN Outcome: Progressing   Problem: Pain Managment: Goal: General experience of comfort will improve 08/04/2022 1602 by Drucie Ip I, RN Outcome: Progressing 08/04/2022 1601 by Drucie Ip I, RN Outcome: Progressing   Problem: Safety: Goal: Ability to remain free from injury will improve 08/04/2022 1602 by Drucie Ip I, RN Outcome: Progressing 08/04/2022 1601 by Drucie Ip I, RN Outcome: Progressing   Problem: Skin Integrity: Goal: Risk for impaired skin integrity will decrease 08/04/2022 1602 by  Drucie Ip I, RN Outcome: Progressing 08/04/2022 1601 by Drucie Ip I, RN Outcome: Progressing

## 2022-08-04 NOTE — Plan of Care (Signed)
     [facility] has been requested to perform a transesophageal echocardiogram on [name] for bacteremia, CVA. Patient is a 66 year old female with a past medical history of HTN, HLD, type 2 DM. Patient is undergoing malignancy work-up through [facility] with recent biopsy suggesting lymphoma. Patient presented to the ED on 10/10 complaining of lethargy, confusion, decreased appetite/poor oral intake. In the ED, patient was febrile with temperature of 38.2C. HR was elevated to 104 BPM. WBC was 54,900. Lactic acid was 2.1. Blood cultures were collected and are pending. CTA chest showed no evidence of PE, multiple enlarged mediastinal and hilar lymphadenopathy and pulmonary nodules bilaterally measuring up to 4.1 cm concerning for neoplastic process or lymphoma. Mildly hazy geographic groundglass opacities in the lungs. Neurology was consulted for worsening mental status. MRI brain showed numerus, diffuse bilateral foci of abnormal diffusion restriction in a pattern that is most suggestive of embolic shower but could also indicate watershed ischemia.   Echocardiogram on 08/03/22 showed EF 60-65%, mild LVH, grade I diastolic dysfunction, normal RV systolic function. TEE was requested for further evaluation of endocarditis, PFO.   Patient is unable to consent due to altered mental status. Discussed with son, Remo Lipps, at bedside.   After careful review of history and examination, the risks and benefits of transesophageal echocardiogram have been explained including risks of esophageal damage, perforation (1:10,000 risk), bleeding, pharyngeal hematoma as well as other potential complications associated with conscious sedation including aspiration, arrhythmia, respiratory failure and death. Alternatives to treatment were discussed, questions were answered. Patient's son consented on patient's behalf.   Margie Billet, PA-C 08/04/2022 12:25 PM

## 2022-08-04 NOTE — Progress Notes (Addendum)
Neurology Progress Note   S:// Patients family is at the bedside. They stated she started to have mental states changes  5 days ago . However she was able to communicate with the family.Her daughter is at the bed side and states her mom had a skin melanoma removed last month , She then developed an infection and  wound culture was positive for MRSA. She was then placed on oral antibiotics.  Patient's issues began this past Friday, when she seemed "not herself", gradually worsening and prompting evaluation.  She would begin 1 task then walk into the other room without finishing it.  She usually watches 8 hours of TV a day, but has been disinterested in this reportedly.  She has been seen talking to herself, the content of this conversation being nonsensical. Her appetite is also very poor. They brought her  to [facility] ED due to being lethargic. Her daughter also reported that her mom has recently been diagnosed with Lymphoma. She has her first oncology appointment  next week  @ [facility] This morning she is very lethargic and sleepy. She is not participating with her family or examiner. Patient at this time will open her eyes to examiners voice and answering questions with yes/no She is po Heparin gtt for left lower DVT She has no history of stroke in the past.  Patient @ this time is globally weak    O:// Current vital signs: BP (!) 150/98   Pulse (!) 106   Temp 99.3 F (37.4 C) (Axillary)   Resp 20   Ht '5\' 3"'$  (1.6 m)   Wt 72.6 kg   SpO2 93%   BMI 28.34 kg/m  Vital signs in last 24 hours: Temp:  [97.9 F (36.6 C)-100.6 F (38.1 C)] 99.3 F (37.4 C) (10/12 1248) Pulse Rate:  [96-106] 106 (10/12 1500) Resp:  [15-24] 20 (10/12 1500) BP: (128-150)/(69-98) 150/98 (10/12 1500) SpO2:  [92 %-96 %] 93 % (10/12 1500) GENERAL: Awake, alert in NAD HEENT: - Normocephalic and atraumatic, dry mm, no LN++, no Thyromegally LUNGS - Clear to auscultation bilaterally with no wheezes CV - S1S2 RRR, no  m/r/g, equal pulses bilaterally. ABDOMEN - Soft, nontender, nondistended with normoactive BS Ext: warm, well perfused, intact peripheral pulses, noedema NEURO:  Mental Status: AA&Ox2, She is drowsy but will open her eyes and needs a lot of ques from examiner. She did identify her husband and daughter @ the bedside She will follow some very simple commands. She did respond appropriately when examiner pulled her blankets  and responded leave me alone and I need that. She will not participate fully. Speech is mostly nonfluent but with cues is able to speak without dysarthria.  Will not speak long sentences.    Cranial Nerves: (Patient during this exam will not participate 100%) she closes her eyes during exam PERRL 89mmm/brisk.  UTA EOMI UTA  visual fields full, no facial asymmetry,UTA facial sensation, normal  sternocleidomastoid and trapezius muscle strength. Motor: Moves all extremities against gravity but with poor effort in all extremities.  No apparent focal weakness Tone: is normal and bulk is normal Sensation- UTA Coordination: UTA Gait- deferred Due too patients condition   Medications  Current Facility-Administered Medications:    0.9 %  sodium chloride infusion, , Intravenous, Continuous, Vann, Jessica U, DO, Last Rate: 75 mL/hr at 08/04/22 1244, New Bag at 08/04/22 1244   0.9 %  sodium chloride infusion, , Intravenous, Continuous, JMargie Billet PA-C   acetaminophen (TYLENOL) tablet 650  mg, 650 mg, Oral, Q6H PRN, 650 mg at 08/03/22 1650 **OR** acetaminophen (TYLENOL) suppository 650 mg, 650 mg, Rectal, Q6H PRN, Opyd, Ilene Qua, MD   ceFEPIme (MAXIPIME) 2 g in sodium chloride 0.9 % 100 mL IVPB, 2 g, Intravenous, Q8H, Opyd, Ilene Qua, MD, Last Rate: 200 mL/hr at 08/04/22 1349, 2 g at 08/04/22 1349   diphenhydrAMINE (BENADRYL) injection 25 mg, 25 mg, Intravenous, Q6H PRN, Opyd, Ilene Qua, MD, 25 mg at 08/03/22 1837   heparin ADULT infusion 100 units/mL (25000 units/219m), 1,200  Units/hr, Intravenous, Continuous, BEinar Grad RPH, Last Rate: 12 mL/hr at 08/04/22 1345, 1,200 Units/hr at 08/04/22 1345   hydrALAZINE (APRESOLINE) injection 10 mg, 10 mg, Intravenous, Q6H PRN, Rai, Ripudeep K, MD   metroNIDAZOLE (FLAGYL) IVPB 500 mg, 500 mg, Intravenous, Q12H, Opyd, TIlene Qua MD, Last Rate: 100 mL/hr at 08/04/22 1118, 500 mg at 08/04/22 1118   ondansetron (ZOFRAN) tablet 4 mg, 4 mg, Oral, Q6H PRN **OR** ondansetron (ZOFRAN) injection 4 mg, 4 mg, Intravenous, Q6H PRN, Opyd, Timothy S, MD   senna-docusate (Senokot-S) tablet 1 tablet, 1 tablet, Oral, QHS PRN, Opyd, TIlene Qua MD   sodium chloride flush (NS) 0.9 % injection 3 mL, 3 mL, Intravenous, Q12H, Opyd, Timothy S, MD, 3 mL at 08/04/22 1119   vancomycin (VANCOREADY) IVPB 750 mg/150 mL, 750 mg, Intravenous, Q12H, Opyd, TIlene Qua MD, Last Rate: 150 mL/hr at 08/04/22 0402, 750 mg at 08/04/22 0402 Labs CBC    Component Value Date/Time   WBC 48.2 (H) 08/04/2022 0140   RBC 3.80 (L) 08/04/2022 0140   HGB 12.2 08/04/2022 0140   HCT 35.0 (L) 08/04/2022 0140   PLT 215 08/04/2022 0140   MCV 92.1 08/04/2022 0140   MCH 32.1 08/04/2022 0140   MCHC 34.9 08/04/2022 0140   RDW 15.8 (H) 08/04/2022 0140   LYMPHSABS 1.7 08/02/2022 1400   MONOABS 0.8 08/02/2022 1400   EOSABS 45.9 (H) 08/02/2022 1400   BASOSABS 0.2 (H) 08/02/2022 1400    CMP     Component Value Date/Time   NA 133 (L) 08/04/2022 0140   K 3.2 (L) 08/04/2022 0140   CL 101 08/04/2022 0140   CO2 20 (L) 08/04/2022 0140   GLUCOSE 125 (H) 08/04/2022 0140   BUN 8 08/04/2022 0140   CREATININE 0.63 08/04/2022 0140   CALCIUM 8.1 (L) 08/04/2022 0140   PROT 7.5 08/02/2022 1351   ALBUMIN 3.2 (L) 08/02/2022 1351   AST 22 08/02/2022 1351   ALT 14 08/02/2022 1351   ALKPHOS 118 08/02/2022 1351   BILITOT 0.9 08/02/2022 1351   GFRNONAA >60 08/04/2022 0140   GFRAA >90 01/12/2012 0149    glycosylated hemoglobin  Lipid Panel     Component Value Date/Time   CHOL  127 08/03/2022 0905   TRIG 97 08/03/2022 0905   HDL 17 (L) 08/03/2022 0905   CHOLHDL 7.5 08/03/2022 0905   VLDL 19 08/03/2022 0905   LDLCALC 91 08/03/2022 0905     Imaging I have reviewed images in epic and the results pertinent to this consultation are:  CT-scan of the brain: 08/02/22 COMPARISON:  MR brain done on 04/15/2013   FINDINGS: Brain: No acute intracranial findings are seen in noncontrast CT brain. There are no signs of bleeding within the cranium. Ventricles are not dilated. Cortical sulci are prominent. There is no focal edema or mass effect.   Vascular: Unremarkable   Skull: Unremarkable.   Sinuses/Orbits: Unremarkable.   Other: None.   IMPRESSION: No acute intracranial  findings are seen in noncontrast CT brain. Atrophy. MRI examination of the brain  CTA Head/Neck: 08/04/22  IMPRESSION: 1. Asymmetrically decreased opacification of the right-sided transverse and sigmoid sinus is worrisome for dural venous sinus thrombosis. Recommend further evaluation with a CT venogram. 2. No intracranial LVO or significant arterial stenosis in the neck. 3. No acute intracranial process. 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 4. Multiple enlarged lymph nodes in the neck, mediastinum, and left axilla, which are compatible with clinically suspected lymphoma. 5. Partially imaged pleural effusions and right sided pulmonary nodule, better seen on prior CTA chest.  MRI brain w/o Contrast: 08/04/22 IMPRESSION: 1. Numerous, diffuse bilateral foci of abnormal diffusion restriction in a pattern that is most suggestive of embolic shower but could also indicate watershed ischemia. 2. No acute hemorrhage or mass effect.   Echocardiogram: IMPRESSIONS Left ventricular ejection fraction, by estimation, is 60 to  65%. The left ventricle has normal function. The left ventricle has no regional wall motion abnormalities. There is mild concentric left ventricular hypertrophy. Left ventricular diastolic parameters are consistent with Grade I diastolic dysfunction (impaired relaxation). 1. Right ventricular systolic function is normal. The right ventricular size is normal. Tricuspid regurgitation signal is inadequate for assessing PA pressure. 2. The mitral valve is normal in structure. No evidence of mitral valve regurgitation. No evidence of mitral stenosis. 3. The aortic valve is tricuspid. There is mild calcification of the aortic valve. Aortic valve regurgitation is not visualized. No aortic stenosis is present. 4. The inferior vena cava is normal in size with greater than 50% respiratory variability, suggesting right atrial pressure of 3 mmHg.    Assessment:  This is a 66 year old female with a recent diagnosis of lymphoma 13 days ago presenting now with a 5-day history of gradually worsening alteration in mental status, impairment of her attention span, nonsensical speech, and lethargy.  She has a nonfocal neurological examination within the limits of her impaired attention span/alteration in mental status. Her presentation  @ this time correlates with toxic/infectious encephalopathy which can be due to her recent MRSA infection. MRI Brain multiple small embolic cortical and subcortical infarcts@ MCA/PCA and ACA/MCA and subcortical areas bilateral . Her Etiology likely from central cardiac source possibly due to suspected bacterial endocarditis versus paradoxical embolism given peroneal vein DVT on lower extremity Dopplers.  Recommendations: Recommendations: -Frequent neuro checks -Recommend TEE to evaluate more sensitively for endocarditis given negative TTE -continued management sepsis per primary team  -HIV assay pending  -Delirium precaution Fall Precaution -Continue antibiotics per  primary team - There is a concern for right sided transverse and sigmoid sinus dural venous sinus thrombosis -Obtain CTV  -Appreciate management of comorbidities per primary team Aspirin alone for peroneal vein DVT at the present time and will hold off on anticoagulation given concern for bacterial endocarditis and high risk for bleeding   #Other Stroke Risk Factors: -a1c 6, no benefit to tight glycemic control acutely (<180-200 goal) but will need follow up with PCP for this -LDL 91, but previously statin intolerant, can be addressed later since less likely small vessel disease risk factors are etiology of this infarction   -- Elenora Gamma, PA-C [facility]  STROKE MD NOTE :  I have personally obtained history,examined this patient, reviewed notes, independently viewed imaging studies, participated in medical decision making and plan of care.ROS  completed by me personally and pertinent positives fully documented  I have made any additions or clarifications directly to the above note. Agree with note above.  Patient presented with confusion and altered mental status which has been progressively getting worse for the last 5 days.  Neurological exam is consistent with delirium without focal weakness.  She had recent MRSA infection following melanoma removal which was treated with oral antibiotics.  MRI scan shows multiple bilateral anterior and posterior circulation cortical and subcortical embolic infarcts likely from a central cardiac source.  Strong suspicion for endocarditis given recent history of infection.  Lower extremity venous Doppler is positive for peroneal vein thrombosis so paradoxical embolism is also on the possibility if she has a PFO.  Recommend continue aspirin for now.  CT angiogram has raised question of cerebral venous sinus thrombosis so we will evaluate this with CT venogram and if confirmed may need to consider IV heparin but will hold off at the moment due to  concern for bacterial endocarditis.  Long discussion with patient's daughter, husband and son at the bedside and answered questions.  Discussed with Dr. Eliseo Squires.  Greater than 50% time during this 50-minute visit was spent in counseling and coordination of care about her multiple embolic strokes and discussion about evaluation treatment and answering questions and discussion with care team.  Antony Contras, MD Medical Director [place] Pager: [phone] 08/04/2022 5:25 PM

## 2022-08-05 ENCOUNTER — Inpatient Hospital Stay (HOSPITAL_COMMUNITY): Payer: Medicare HMO | Admitting: Anesthesiology

## 2022-08-05 ENCOUNTER — Inpatient Hospital Stay (HOSPITAL_COMMUNITY): Payer: Medicare HMO

## 2022-08-05 ENCOUNTER — Encounter (HOSPITAL_COMMUNITY)
Admission: EM | Disposition: A | Discharge status: Hospice | Payer: Self-pay | Source: Home / Self Care | Attending: Family Medicine

## 2022-08-05 ENCOUNTER — Encounter (HOSPITAL_COMMUNITY): Payer: Self-pay | Admitting: Family Medicine

## 2022-08-05 DIAGNOSIS — Q2112 Patent foramen ovale: Secondary | ICD-10-CM | POA: Diagnosis not present

## 2022-08-05 DIAGNOSIS — I82451 Acute embolism and thrombosis of right peroneal vein: Secondary | ICD-10-CM | POA: Diagnosis not present

## 2022-08-05 DIAGNOSIS — E119 Type 2 diabetes mellitus without complications: Secondary | ICD-10-CM | POA: Diagnosis not present

## 2022-08-05 DIAGNOSIS — Z7984 Long term (current) use of oral hypoglycemic drugs: Secondary | ICD-10-CM | POA: Diagnosis not present

## 2022-08-05 DIAGNOSIS — G9341 Metabolic encephalopathy: Secondary | ICD-10-CM | POA: Diagnosis not present

## 2022-08-05 DIAGNOSIS — Z87891 Personal history of nicotine dependence: Secondary | ICD-10-CM

## 2022-08-05 DIAGNOSIS — I639 Cerebral infarction, unspecified: Secondary | ICD-10-CM | POA: Diagnosis not present

## 2022-08-05 DIAGNOSIS — I10 Essential (primary) hypertension: Secondary | ICD-10-CM

## 2022-08-05 DIAGNOSIS — I340 Nonrheumatic mitral (valve) insufficiency: Secondary | ICD-10-CM

## 2022-08-05 DIAGNOSIS — A4102 Sepsis due to Methicillin resistant Staphylococcus aureus: Secondary | ICD-10-CM | POA: Diagnosis not present

## 2022-08-05 HISTORY — PX: BUBBLE STUDY: SHX6837

## 2022-08-05 HISTORY — PX: TEE WITHOUT CARDIOVERSION: SHX5443

## 2022-08-05 LAB — ECHO TEE
AV Mean grad: 6 mmHg
AV Peak grad: 10.2 mmHg
Ao pk vel: 1.6 m/s

## 2022-08-05 LAB — URINE CULTURE: Culture: 10000 — AB

## 2022-08-05 LAB — CBC
HCT: 39.1 % (ref 36.0–46.0)
Hemoglobin: 13.1 g/dL (ref 12.0–15.0)
MCH: 31.3 pg (ref 26.0–34.0)
MCHC: 33.5 g/dL (ref 30.0–36.0)
MCV: 93.3 fL (ref 80.0–100.0)
Platelets: 213 10*3/uL (ref 150–400)
RBC: 4.19 MIL/uL (ref 3.87–5.11)
RDW: 15.9 % — ABNORMAL HIGH (ref 11.5–15.5)
WBC: 46.3 10*3/uL — ABNORMAL HIGH (ref 4.0–10.5)
nRBC: 0 % (ref 0.0–0.2)

## 2022-08-05 LAB — BASIC METABOLIC PANEL
Anion gap: 14 (ref 5–15)
BUN: 8 mg/dL (ref 8–23)
CO2: 15 mmol/L — ABNORMAL LOW (ref 22–32)
Calcium: 8 mg/dL — ABNORMAL LOW (ref 8.9–10.3)
Chloride: 104 mmol/L (ref 98–111)
Creatinine, Ser: 0.87 mg/dL (ref 0.44–1.00)
GFR, Estimated: >60 mL/min (ref >60)
Glucose, Bld: 157 mg/dL — ABNORMAL HIGH (ref 70–99)
Potassium: 3.2 mmol/L — ABNORMAL LOW (ref 3.5–5.1)
Sodium: 133 mmol/L — ABNORMAL LOW (ref 135–145)

## 2022-08-05 LAB — VANCOMYCIN, TROUGH: Vancomycin Tr: 8 ug/mL — ABNORMAL LOW (ref 15–20)

## 2022-08-05 LAB — VANCOMYCIN, PEAK: Vancomycin Pk: 12 ug/mL — ABNORMAL LOW (ref 30–40)

## 2022-08-05 SURGERY — ECHOCARDIOGRAM, TRANSESOPHAGEAL
Anesthesia: Monitor Anesthesia Care

## 2022-08-05 MED ORDER — VANCOMYCIN HCL IN DEXTROSE 1-5 GM/200ML-% IV SOLN
1000.0000 mg | Freq: Two times a day (BID) | INTRAVENOUS | Status: DC
  Administered 2022-08-06 – 2022-08-08 (×6): 1000 mg via INTRAVENOUS
  Filled 2022-08-05 (×8): qty 200

## 2022-08-05 MED ORDER — HEPARIN (PORCINE) 25000 UT/250ML-% IV SOLN
1400.0000 [IU]/h | INTRAVENOUS | Status: DC
  Administered 2022-08-05: 1200 [IU]/h via INTRAVENOUS
  Filled 2022-08-05: qty 250

## 2022-08-05 MED ORDER — SODIUM CHLORIDE 0.9 % IV SOLN
2.0000 g | INTRAVENOUS | Status: DC
  Administered 2022-08-05 – 2022-08-07 (×3): 2 g via INTRAVENOUS
  Filled 2022-08-05 (×5): qty 20

## 2022-08-05 MED ORDER — PROPOFOL 10 MG/ML IV BOLUS
INTRAVENOUS | Status: DC | PRN
  Administered 2022-08-05: 30 mg via INTRAVENOUS
  Administered 2022-08-05: 40 mg via INTRAVENOUS

## 2022-08-05 NOTE — Plan of Care (Signed)
Discussed with Dr. Bonner Puna. Okay to start Heparin gtt. Dr. Leonie Man from stroke team also evaluated patient and his attestation from today does recommend starting IV heparin.  Can aim for goal heparin levels between 0.3-0.5.  [place] Pager Number [phone]

## 2022-08-05 NOTE — Progress Notes (Signed)
SLP Cancellation Note  Patient Details Name: MARGARITA CROKE MRN: [id number] DOB: [birth year]   Cancelled treatment:       Reason Eval/Treat Not Completed: Patient at procedure or test/unavailable (for TEE today). Will f/u as able.    Osie Bond., M.A. [place] Office [phone]  Secure chat preferred  08/05/2022, 1:46 PM

## 2022-08-05 NOTE — Anesthesia Postprocedure Evaluation (Signed)
Anesthesia Post Note  Patient: Sarah Walsh  Procedure(s) Performed: TRANSESOPHAGEAL ECHOCARDIOGRAM (TEE) BUBBLE STUDY     Patient location during evaluation: PACU Anesthesia Type: MAC Level of consciousness: awake and alert Pain management: pain level controlled Vital Signs Assessment: post-procedure vital signs reviewed and stable Respiratory status: spontaneous breathing, nonlabored ventilation and respiratory function stable Cardiovascular status: blood pressure returned to baseline and stable Postop Assessment: no apparent nausea or vomiting Anesthetic complications: no   No notable events documented.  Last Vitals:  Vitals:   08/05/22 1413 08/05/22 1423  BP: 130/84 (!) 132/97  Pulse: 95 (!) 104  Resp: (!) 24 (!) 25  Temp:    SpO2: 93% 92%    Last Pain:  Vitals:   08/05/22 1353  TempSrc: Temporal  PainSc:                  Pervis Hocking

## 2022-08-05 NOTE — Care Management Important Message (Signed)
Important Message  Patient Details  Name: GEANA WALTS MRN: [id number] Date of Birth: Aug 16, 1956   Medicare Important Message Given:  Yes     Shelda Altes 08/05/2022, 10:33 AM

## 2022-08-05 NOTE — Transfer of Care (Signed)
Immediate Anesthesia Transfer of Care Note  Patient: Sarah Walsh  Procedure(s) Performed: TRANSESOPHAGEAL ECHOCARDIOGRAM (TEE) BUBBLE STUDY  Patient Location: Endoscopy Unit  Anesthesia Type:MAC  Level of Consciousness: obtunded  Airway & Oxygen Therapy: Patient Spontanous Breathing and Patient connected to nasal cannula oxygen  Post-op Assessment: Report given to RN, Post -op Vital signs reviewed and stable, and Patient moving all extremities X 4  Post vital signs: Reviewed and stable  Last Vitals:  Vitals Value Taken Time  BP 113/56   Temp    Pulse 102   Resp 14   SpO2 91     Last Pain:  Vitals:   08/05/22 1254  TempSrc: Temporal  PainSc:       Patients Stated Pain Goal: 0 (35/82/51 8984)  Complications: No notable events documented.

## 2022-08-05 NOTE — Progress Notes (Addendum)
[facility]                                                                              Crislyn Willbanks, is a 66 y.o. female, DOB - [birth year], JKK:[id number] Admit date - 08/02/2022    Outpatient Primary MD for the patient is Sueanne Margarita, DO  LOS - 2  days  Chief Complaint  Patient presents with   Altered Mental Status       Brief summary   Patient is a 66 year old female with HTN, HLP, diabetes mellitus type 2, undergoing malignancy work-up through [facility] and found to have lymphoma, not yet started on treatment, presented to ED with lethargy, confusion, anorexia, gait instability.  CTA chest showed no evidence of PE, multiple enlarged mediastinal and hilar lymphadenopathy and pulmonary nodules bilaterally measuring up to 4.1 cm concerning for neoplastic process or lymphoma.  Mildly hazy geographic groundglass opacities in the lungs.  MRI positive for scattered cortical and subcortical infarcts  Assessment & Plan    Sepsis [facility]) -Patient met sepsis criteria on admission with tachycardia, fevers, leukocytosis, lactic acidosis, source unclear.  No UTI, no pneumonia however has lymphoma, recently diagnosed. No meningismus.  -Follow blood cultures, broad-spectrum IV antibiotics. DDx fever includes due to lymphoma, less likely blood clot. ?if severity of infarcts contributed.  - DC cefepime due to concern for lowering seizure threshold. Replace with ceftriaxone 2g q24h, continue vancomycin (reported history of MRSA) and flagyl empirically though cultures are negative  Acute metabolic encephalopathy, Acute CVA (cerebrovascular accident) ([place]) - Discussion earlier in the day with neurology, Dr. Leonie Man x2 and with cardiology, Dr. Gardiner Rhyme regarding necessity for TEE as a branchpoint in management despite her lethargy. They involved anesthesia and the patient fortunately maintained and continues to maintain her airway. Brief discussion with family, pt remains full code.  -MRI brain  showed numerous diffuse bilateral foci of abnormal diffusion restriction in a pattern most suggestive of embolic shower but could also indicate watershed ischemia. Due to worsening lethargy and exam, repeat MRI ordered today and confirms extensive infarcts worsened on my personal review, an opinion shared by neurology, Dr. Lorrin Goodell. Per my discussions, anticoagulation is not contraindicated and can be continued. He will order EEG and advised stopping cefepime which we have done. I called and relayed this information to the patient's daughter.  - CTV negative for dural venous sinus thrombosis.  - PFO found on TEE. In conjunction with know DVT (albeit distal) and embolic distribution, started heparin. I've discussed with pharmacy that our goal level should be limited a bit, ideally 0.3-0.5.     Essential hypertension - Permissive hypertension - placed on hydralazine IV as with parameters  Acute RLE peroneal DVT - CTA negative for PE.  - Heparin as discussed above.  - Interestingly, per my discussion with heme/onc, Dr. Lorenso Courier, lymphomas are not classically associated with hypercoagulability. The severity of leukocytosis is also not near the expected threshold to cause leukostasis.     Lymphoma ([place]) -New diagnosis, has not been started on treatment yet.  Per patient's daughter, they were supposed to meet with her [facility] oncologist on Friday, 10/13 to discuss further management -  Oncology, Dr. Lorenso Courier consulted. I spoke with him today at length. Of course the patient is not currently a candidate for treatment and, given her normal cell counts otherwise, doubt there'd be any benefit to pulsed steroids at this time. There would certainly be risks as well.  - There is no further work up or treatment currently planned as an inpatient for this issue.  - Notes in reference to path reports, biopsy results, etc. reviewed in [place] today  Eosinophilic leukocytosis: A bit puzzling to me as she has no  evidence of parasitic infection or ongoing drug/allergic reaction. I'm unclear whether this is related to her malignancy. Could be expected at the time of her drug eruption after Tx for MRSA recently though that agent was stopped and rash has resolved durably. I do suspect this is the cause of her diffuse pruritus.  - Monitor    Hypokalemia -Replaced  Code Status: Full CODE STATUS Level of Care: Level of care: Progressive Family Communication: Son and Daughter at bedside.   Disposition Plan:      Remains inpatient appropriate  Consultants:   Neurology  Oncology, Dr. Lorenso Courier  Subjective:   She will say one word some of the time, but not to me, increased lethargy and minimal responsiveness per family at bedside. No seizure activity noted.   Objective:  BP (!) 145/94 (BP Location: Right Arm)   Pulse (!) 111   Temp 98.6 F (37 C) (Axillary)   Resp 16   Ht $R'5\' 3"'VO$  (1.6 m)   Wt 72.6 kg   SpO2 93%   BMI 28.34 kg/m   Gen: 66 y.o. female sleeping/resting soundly. She withdraws to noxious stimuli in all extremities, opened eyes to loud voice for me. Later today required sternal rub.  Pulm: Normal rate, belly breathing, regular. Clear. CV: Regular tachycardia without murmur, rub, or gallop. No JVD, no dependent edema. GI: Abdomen soft, non-distended, with normoactive bowel sounds.  Ext: Warm, no deformities. No nuchal rigidity  Skin: No acute rashes, lesions or ulcers on visualized skin. Neuro: Rousable for a matter of a second or so, did not phonate, or follow commands.  CBC Lab Results  Component Value Date   WBC 46.3 (H) 08/05/2022   RBC 4.19 08/05/2022   HGB 13.1 08/05/2022   HCT 39.1 08/05/2022   MCV 93.3 08/05/2022   MCH 31.3 08/05/2022   PLT 213 08/05/2022   MCHC 33.5 08/05/2022   RDW 15.9 (H) 08/05/2022   LYMPHSABS 1.7 08/02/2022   MONOABS 0.8 08/02/2022   EOSABS 45.9 (H) 08/02/2022   BASOSABS 0.2 (H) 17/61/6073     Last metabolic panel Lab Results  Component  Value Date   NA 133 (L) 08/05/2022   K 3.2 (L) 08/05/2022   CL 104 08/05/2022   CO2 15 (L) 08/05/2022   BUN 8 08/05/2022   CREATININE 0.87 08/05/2022   GLUCOSE 157 (H) 08/05/2022   GFRNONAA >60 08/05/2022   GFRAA >90 01/12/2012   CALCIUM 8.0 (L) 08/05/2022   PHOS 2.6 08/02/2022   PROT 7.5 08/02/2022   ALBUMIN 3.2 (L) 08/02/2022   BILITOT 0.9 08/02/2022   ALKPHOS 118 08/02/2022   AST 22 08/02/2022   ALT 14 08/02/2022   ANIONGAP 14 08/05/2022    CBG (last 3)  No results for input(s): "GLUCAP" in the last 72 hours.     Coagulation Profile: Recent Labs  Lab 08/02/22 1902  INR 1.3*  1.2     Radiology Studies: I have personally reviewed the imaging  studies  MR BRAIN WO CONTRAST  Result Date: 08/05/2022 CLINICAL DATA:  History of stroke, mental status change EXAM: MRI HEAD WITHOUT CONTRAST TECHNIQUE: Multiplanar, multiecho pulse sequences of the brain and surrounding structures were obtained without intravenous contrast. COMPARISON:  CT head 1 day prior, brain MRI 08/02/2022 FINDINGS: Brain: Again seen are extensive acute infarcts throughout both cerebral and cerebellar hemispheres across bilateral ACA, MCA, and PCA distributions, overall increased in extent compared to the MRI from 3 days prior. There is associated FLAIR signal abnormality and a few punctate foci of SWI signal dropout suspicious for petechial hemorrhage but no organized hematoma or mass effect. The foci of intrinsic T1 hyperintensity in the occipital lobes may be due to petechial hemorrhage. There is no acute intracranial hemorrhage or extra-axial fluid collection Background parenchymal volume is normal. The ventricles are stable in size. There is no mass lesion. There is no mass effect or midline shift. Vascular: Normal flow voids. Skull and upper cervical spine: Normal marrow signal. Sinuses/Orbits: The paranasal sinuses are clear. The globes and orbits are unremarkable. Other: None. IMPRESSION: Extensive acute  infarcts throughout both cerebral and cerebellar hemispheres across multiple vascular distributions again favored embolic with differential including watershed ischemia, increased in extent since the MRI from 3 days prior. Suspect some petechial hemorrhage but no hematoma or mass effect. Electronically Signed   By: Valetta Mole M.D.   On: 08/05/2022 19:45   ECHO TEE  Result Date: 08/05/2022    TRANSESOPHOGEAL ECHO REPORT   Patient Name:   CHIARA COLTRIN Date of Exam: 08/05/2022 Medical Rec #:  [id number]     Height:       63.0 in Accession #:    [id number]    Weight:       160.0 lb Date of Birth:  26-Sep-1956     BSA:          1.759 m Patient Age:    54 years      BP:           125/78 mmHg Patient Gender: F             HR:           99 bpm. Exam Location:  Inpatient Procedure: Transesophageal Echo, 3D Echo, Color Doppler, Cardiac Doppler and            Saline Contrast Bubble Study Indications:     Stroke i63.9  History:         Patient has prior history of Echocardiogram examinations, most                  recent 08/03/2022. Risk Factors:Hypertension, Diabetes and                  Dyslipidemia.  Sonographer:     Raquel Sarna Senior RDCS Referring Phys:  [id number] Margie Billet Diagnosing Phys: Oswaldo Milian MD PROCEDURE: After discussion of the risks and benefits of a TEE, an informed consent was obtained from the patient. The transesophogeal probe was passed without difficulty through the esophogus of the patient. Sedation performed by different physician. The patient was monitored while under deep sedation. Anesthestetic sedation was provided intravenously by Anesthesiology: 70mg  of Propofol. The patient developed no complications during the procedure. IMPRESSIONS  1. Left ventricular ejection fraction, by estimation, is 55 to 60%. The left ventricle has normal function.  2. Right ventricular systolic function is normal. The right ventricular size is normal. There is moderately elevated pulmonary artery  systolic pressure.  3. No left atrial/left atrial appendage thrombus was detected.  4. The mitral valve is normal in structure. Mild mitral valve regurgitation.  5. The aortic valve is tricuspid. Aortic valve regurgitation is not visualized.  6. Evidence of atrial level shunting detected by color flow Doppler. Agitated saline contrast bubble study was positive with shunting observed within 3-6 cardiac cycles suggestive of interatrial shunt. Few bubbles seen in left atrium after patient coughed, suggesting small PFO. Small PFO confirmed visually with left to right shunting.  7. No vegetation seen FINDINGS  Left Ventricle: Left ventricular ejection fraction, by estimation, is 55 to 60%. The left ventricle has normal function. The left ventricular internal cavity size was normal in size. Right Ventricle: The right ventricular size is normal. No increase in right ventricular wall thickness. Right ventricular systolic function is normal. There is moderately elevated pulmonary artery systolic pressure. The tricuspid regurgitant velocity is 3.35 m/s, and with an assumed right atrial pressure of 3 mmHg, the estimated right ventricular systolic pressure is 83.3 mmHg. Left Atrium: Left atrial size was normal in size. No left atrial/left atrial appendage thrombus was detected. Right Atrium: Right atrial size was normal in size. Pericardium: Trivial pericardial effusion is present. Mitral Valve: The mitral valve is normal in structure. Mild mitral valve regurgitation. Tricuspid Valve: The tricuspid valve is normal in structure. Tricuspid valve regurgitation is mild. Aortic Valve: The aortic valve is tricuspid. Aortic valve regurgitation is not visualized. Aortic valve mean gradient measures 6.0 mmHg. Aortic valve peak gradient measures 10.2 mmHg. Pulmonic Valve: The pulmonic valve was grossly normal. Pulmonic valve regurgitation is not visualized. Aorta: The aortic root is normal in size and structure. IAS/Shunts: Evidence of  atrial level shunting detected by color flow Doppler. Agitated saline contrast was given intravenously to evaluate for intracardiac shunting. Agitated saline contrast bubble study was positive with shunting observed within 3-6 cardiac cycles suggestive of interatrial shunt.  AORTIC VALVE AV Vmax:           160.00 cm/s AV Vmean:          112.000 cm/s AV VTI:            0.244 m AV Peak Grad:      10.2 mmHg AV Mean Grad:      6.0 mmHg LVOT Vmax:         115.00 cm/s LVOT Vmean:        83.500 cm/s LVOT VTI:          0.210 m LVOT/AV VTI ratio: 0.86 TRICUSPID VALVE TR Peak grad:   44.9 mmHg TR Vmax:        335.00 cm/s  SHUNTS Systemic VTI: 0.21 m Oswaldo Milian MD Electronically signed by Oswaldo Milian MD Signature Date/Time: 08/05/2022/4:04:01 PM    Final    CT VENOGRAM HEAD  Result Date: 08/04/2022 CLINICAL DATA:  Dural venous sinus thrombosis suspected. EXAM: CT VENOGRAM HEAD TECHNIQUE: Venographic phase images of the brain were obtained following the administration of intravenous contrast. Multiplanar reformats and maximum intensity projections were generated. RADIATION DOSE REDUCTION: This exam was performed according to the departmental dose-optimization program which includes automated exposure control, adjustment of the mA and/or kV according to patient size and/or use of iterative reconstruction technique. CONTRAST:  47mL OMNIPAQUE IOHEXOL 350 MG/ML SOLN COMPARISON:  Head and neck CTA 08/04/2022.  Head MRI 08/02/2022. FINDINGS: The recent MRI demonstrated numerous acute infarcts involving both cerebral hemispheres and cerebellum, the majority of which are occult by CT. No acute intracranial hemorrhage, midline  shift, or extra-axial fluid collection is identified. The ventricles are normal in size. No fracture or suspicious osseous lesion is identified. There is minimal mucosal thickening in the left sphenoid sinus. The mastoid air cells are clear. The orbits are unremarkable. CT venogram images  are motion degraded, however the superior sagittal sinus, internal cerebral veins, vein of Galen, straight sinus, and sigmoid sinuses are enhancing without evidence of thrombosis. The transverse sinuses are small but grossly patent. A small filling defect in the posterior aspect of the superior sagittal sinus is compatible with an arachnoid granulation. IMPRESSION: No evidence of dural venous sinus thrombosis. Electronically Signed   By: Logan Bores M.D.   On: 08/04/2022 16:45   CT ANGIO HEAD NECK W WO CM  Addendum Date: 08/04/2022   ADDENDUM REPORT: 08/04/2022 11:52 ADDENDUM: Findings were discussed with Dr. Eliseo Squires on 08/04/22 at 11:50 AM. Electronically Signed   By: Marin Roberts M.D.   On: 08/04/2022 11:52   Result Date: 08/04/2022 CLINICAL DATA:  Stroke follow-up EXAM: CT ANGIOGRAPHY HEAD AND NECK TECHNIQUE: Multidetector CT imaging of the head and neck was performed using the standard protocol during bolus administration of intravenous contrast. Multiplanar CT image reconstructions and MIPs were obtained to evaluate the vascular anatomy. Carotid stenosis measurements (when applicable) are obtained utilizing NASCET criteria, using the distal internal carotid diameter as the denominator. RADIATION DOSE REDUCTION: This exam was performed according to the departmental dose-optimization program which includes automated exposure control, adjustment of the mA and/or kV according to patient size and/or use of iterative reconstruction technique. CONTRAST:  34mL OMNIPAQUE IOHEXOL 350 MG/ML SOLN COMPARISON:  MRI Head 08/02/22 FINDINGS: CT HEAD FINDINGS Brain: No evidence of hemorrhage, hydrocephalus, extra-axial collection or mass lesion/mass effect. 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 Vascular: See below Skull: Normal. Negative for fracture or  focal lesion. Sinuses/Orbits: No acute finding. Other: None. Review of the MIP images confirms the above findings CTA NECK FINDINGS Aortic arch: Standard branching. Imaged portion shows no evidence of aneurysm or dissection. No significant stenosis of the major arch vessel origins. Right carotid system: No evidence of dissection, stenosis (50% or greater), or occlusion. Left carotid system: No evidence of dissection, stenosis (50% or greater), or occlusion. Vertebral arteries: Codominant. No evidence of dissection, stenosis (50% or greater), or occlusion. Skeleton: Negative. Other neck: Asymmetrically enlarged right level 5 lymph node measuring up to 6 mm (series 9, image 122). Additional enlarged right level 5 lymph node is present more superiorly measuring 9 mm (series 9, image 106). Enlarged 7 mm left level 5 lymph node is also present (series 9, image 115). 9 mm right level 2A lymph node (series 9, image 94). There is soft tissue stranding along the posterior right suboccipital neck (series 9, image 87), nonspecific. Upper chest: Small bilateral pleural effusions. There is a 1.2 cm right upper lobe pulmonary nodule, better assessed on prior CT chest dated 08/04/2019. There also multiple enlarged mediastinal lymph nodes and left axillary lymph node. Review of the MIP images confirms the above findings CTA HEAD FINDINGS Anterior circulation: No significant stenosis, proximal occlusion, aneurysm, or vascular malformation. Posterior circulation: No significant stenosis, proximal occlusion, aneurysm, or vascular malformation. Venous sinuses: There is a congenitally small left transverse and sigmoid sinus. There is decreased opacification of the right-sided transverse and sigmoid sinus (series 11, image 122). This area of  decreased opacification extends proximally to involve the superior sagittal sinus hazy month Anatomic variants: None Review of the MIP images confirms the above findings IMPRESSION: 1. Asymmetrically  decreased opacification of the right-sided transverse and sigmoid sinus is worrisome for dural venous sinus thrombosis. Recommend further evaluation with a CT venogram. 2. No intracranial LVO or significant arterial stenosis in the neck. 3. No acute intracranial process. 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 4. Multiple enlarged lymph nodes in the neck, mediastinum, and left axilla, which are compatible with clinically suspected lymphoma. 5. Partially imaged pleural effusions and right sided pulmonary nodule, better seen on prior CTA chest. Electronically Signed: By: Marin Roberts M.D. On: 08/04/2022 11:17       Patrecia Pour, MD [facility] 08/05/2022, 8:23 PM  Available via Epic secure chat 7am-7pm After 7 pm, please refer to night coverage provider listed on amion.

## 2022-08-05 NOTE — Progress Notes (Signed)
OT Cancellation Note  Patient Details Name: MAKEISHA JENTSCH MRN: [id number] DOB: Feb 10, 1956   Cancelled Treatment:    Reason Eval/Treat Not Completed: Patient not medically ready;Fatigue/lethargy limiting ability to participate: Per chart review from today, pt only responding "ouch" to sternal rub. No other verbalizations, keeping eyes closed, and not following any commands. OT will continue to monitor to determine whether pt can participate with therapy.   Julien Girt 08/05/2022, 1:02 PM

## 2022-08-05 NOTE — Progress Notes (Addendum)
Neurology Progress Note   S:// Family at the bedside.  To sternal rub says ouch No focal deficits, slow to respond, encephalopathic WBC elevated @ 46.3 afebrile on ABX per primary team  TEE scheduled for later today O:// Current vital signs: BP (!) 147/95 (BP Location: Left Arm)   Pulse (!) 107   Temp 99.6 F (37.6 C) (Axillary)   Resp (!) 22   Ht '5\' 3"'$  (1.6 m)   Wt 72.6 kg   SpO2 93%   BMI 28.34 kg/m  Vital signs in last 24 hours: Temp:  [98.1 F (36.7 C)-100.2 F (37.9 C)] 99.6 F (37.6 C) (10/13 0900) Pulse Rate:  [96-111] 107 (10/13 0900) Resp:  [15-95] 22 (10/13 0900) BP: (130-156)/(79-100) 147/95 (10/13 0900) SpO2:  [92 %-95 %] 93 % (10/13 0900)  GENERAL:lethargic in NAD  HEENT: - Normocephalic and atraumatic, dry mm LUNGS - Clear to auscultation bilaterally with no wheezes CV - S1S2 RRR, no m/r/g, equal pulses bilaterally. ABDOMEN - Soft, nontender, nondistended with normoactive BS Ext: warm, well perfused, intact peripheral pulses, no edema  NEURO:  Mental Status: very lethargic said ouch to sternal rub. Will not answer questions or follow commands.   Cranial Nerves: (Patient during this exam will not participate 100%) she closes her eyes during exam PERRL 51mmm/brisk.  UTA EOMI UTA  visual fields full, no facial asymmetry,UTA facial sensation, normal  sternocleidomastoid and trapezius muscle strength. Motor: Moves all extremities against gravity but with poor effort in all extremities.  No apparent focal weakness Tone: is normal and bulk is normal Sensation- UTA Coordination: UTA Gait- deferred Due too patients condition   Medications  Current Facility-Administered Medications:    0.9 %  sodium chloride infusion, , Intravenous, Continuous, Vann, Jessica U, DO, Last Rate: 75 mL/hr at 08/05/22 0438, Infusion Verify at 08/05/22 0438   0.9 %  sodium chloride infusion, , Intravenous, Continuous, JMargie Billet PA-C   acetaminophen (TYLENOL) tablet 650 mg,  650 mg, Oral, Q6H PRN, 650 mg at 08/03/22 1650 **OR** acetaminophen (TYLENOL) suppository 650 mg, 650 mg, Rectal, Q6H PRN, Opyd, TIlene Qua MD, 650 mg at 08/05/22 0610   ceFEPIme (MAXIPIME) 2 g in sodium chloride 0.9 % 100 mL IVPB, 2 g, Intravenous, Q8H, Opyd, TIlene Qua MD, Last Rate: 200 mL/hr at 08/05/22 0543, 2 g at 08/05/22 0543   diphenhydrAMINE (BENADRYL) injection 25 mg, 25 mg, Intravenous, Q6H PRN, Opyd, TIlene Qua MD, 25 mg at 08/03/22 1837   hydrALAZINE (APRESOLINE) injection 10 mg, 10 mg, Intravenous, Q6H PRN, Rai, Ripudeep K, MD, 10 mg at 08/04/22 2035   metroNIDAZOLE (FLAGYL) IVPB 500 mg, 500 mg, Intravenous, Q12H, Opyd, TIlene Qua MD, Stopped at 08/04/22 2357   ondansetron (ZOFRAN) tablet 4 mg, 4 mg, Oral, Q6H PRN **OR** ondansetron (ZOFRAN) injection 4 mg, 4 mg, Intravenous, Q6H PRN, Opyd, Timothy S, MD   senna-docusate (Senokot-S) tablet 1 tablet, 1 tablet, Oral, QHS PRN, Opyd, TIlene Qua MD   sodium chloride flush (NS) 0.9 % injection 3 mL, 3 mL, Intravenous, Q12H, Opyd, Timothy S, MD, 3 mL at 08/04/22 2226   vancomycin (VANCOREADY) IVPB 750 mg/150 mL, 750 mg, Intravenous, Q12H, Opyd, TIlene Qua MD, Stopped at 08/05/22 0416 Labs CBC    Component Value Date/Time   WBC 46.3 (H) 08/05/2022 0314   RBC 4.19 08/05/2022 0314   HGB 13.1 08/05/2022 0314   HCT 39.1 08/05/2022 0314   PLT 213 08/05/2022 0314   MCV 93.3 08/05/2022 0314   MCH 31.3 08/05/2022 0314  MCHC 33.5 08/05/2022 0314   RDW 15.9 (H) 08/05/2022 0314   LYMPHSABS 1.7 08/02/2022 1400   MONOABS 0.8 08/02/2022 1400   EOSABS 45.9 (H) 08/02/2022 1400   BASOSABS 0.2 (H) 08/02/2022 1400    CMP     Component Value Date/Time   NA 133 (L) 08/05/2022 0314   K 3.2 (L) 08/05/2022 0314   CL 104 08/05/2022 0314   CO2 15 (L) 08/05/2022 0314   GLUCOSE 157 (H) 08/05/2022 0314   BUN 8 08/05/2022 0314   CREATININE 0.87 08/05/2022 0314   CALCIUM 8.0 (L) 08/05/2022 0314   PROT 7.5 08/02/2022 1351   ALBUMIN 3.2 (L) 08/02/2022  1351   AST 22 08/02/2022 1351   ALT 14 08/02/2022 1351   ALKPHOS 118 08/02/2022 1351   BILITOT 0.9 08/02/2022 1351   GFRNONAA >60 08/05/2022 0314   GFRAA >90 01/12/2012 0149    glycosylated hemoglobin  Lipid Panel     Component Value Date/Time   CHOL 127 08/03/2022 0905   TRIG 97 08/03/2022 0905   HDL 17 (L) 08/03/2022 0905   CHOLHDL 7.5 08/03/2022 0905   VLDL 19 08/03/2022 0905   LDLCALC 91 08/03/2022 0905     Imaging I have reviewed images in epic and the results pertinent to this consultation are:  CT-scan of the brain: 08/02/22 COMPARISON:  MR brain done on 04/15/2013   FINDINGS: Brain: No acute intracranial findings are seen in noncontrast CT brain. There are no signs of bleeding within the cranium. Ventricles are not dilated. Cortical sulci are prominent. There is no focal edema or mass effect.   Vascular: Unremarkable   Skull: Unremarkable.   Sinuses/Orbits: Unremarkable.   Other: None.   IMPRESSION: No acute intracranial findings are seen in noncontrast CT brain. Atrophy. MRI examination of the brain  CTA Head/Neck: 08/04/22  IMPRESSION: 1. Asymmetrically decreased opacification of the right-sided transverse and sigmoid sinus is worrisome for dural venous sinus thrombosis. Recommend further evaluation with a CT venogram. 2. No intracranial LVO or significant arterial stenosis in the neck. 3. No acute intracranial process. 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 4. Multiple enlarged lymph nodes in the neck, mediastinum, and left axilla, which are compatible with clinically suspected lymphoma. 5. Partially imaged pleural effusions and right sided pulmonary nodule, better seen on prior CTA chest.  MRI brain w/o Contrast: 08/04/22 IMPRESSION: 1. Numerous, diffuse bilateral foci of  abnormal diffusion restriction in a pattern that is most suggestive of embolic shower but could also indicate watershed ischemia. 2. No acute hemorrhage or mass effect.   Echocardiogram: IMPRESSIONS Left ventricular ejection fraction, by estimation, is 60 to 65%. The left ventricle has normal function. The left ventricle has no regional wall motion abnormalities. There is mild concentric left ventricular hypertrophy. Left ventricular diastolic parameters are consistent with Grade I diastolic dysfunction (impaired relaxation). 1.Right ventricular systolic function is normal. The right ventricular size is normal. Tricuspid regurgitation signal is inadequate for assessing PA pressure. 2.The mitral valve is normal in structure. No evidence of mitral valve regurgitation. No evidence of mitral stenosis. 3. The aortic valve is tricuspid. There is mild calcification of the aortic valve. Aortic valve regurgitation is not visualized. No aortic stenosis is present. 4. The inferior vena cava is normal in size with greater than 50% respiratory variability, suggesting right atrial pressure of 3 mmHg.  Assessment:  This is a 66 year old female with a recent diagnosis of lymphoma 13 days ago presenting now with a 5-day history of gradually worsening alteration in mental status, impairment of her attention span, nonsensical speech, and lethargy.  She has a nonfocal neurological examination within the limits of her impaired attention span/alteration in mental status. Her presentation  @ this time correlates with toxic/infectious encephalopathy which can be due to her recent MRSA infection.  MRI Brain multiple small embolic cortical and subcortical infarcts@ MCA/PCA and ACA/MCA and subcortical areas bilateral . Her Etiology likely from central cardiac source possibly due to suspected bacterial endocarditis versus paradoxical embolism given peroneal vein DVT on lower extremity Dopplers.  CTV No evidence of  dural venous sinus thrombosis  Recommendations: -Frequent neuro checks -Recommend TEE to evaluate more sensitively for endocarditis given negative TTE -continued management sepsis per primary team  -Delirium precaution Fall Precaution -Continue antibiotics per primary team -Appreciate management of comorbidities per primary team Aspirin alone for peroneal vein DVT at the present time and will hold off on anticoagulation given concern for bacterial endocarditis and high risk for bleeding   #Other Stroke Risk Factors: -a1c 6, no benefit to tight glycemic control acutely (<180-200 goal) but will need follow up with PCP for this -LDL 91, but previously statin intolerant, can be addressed later since less likely small vessel disease risk factors are etiology of this infarction    Beulah Gandy DNP, ACNPC-AG     I have personally obtained history,examined this patient, reviewed notes, independently viewed imaging studies, participated in medical decision making and plan of care.ROS completed by me personally and pertinent positives fully documented  I have made any additions or clarifications directly to the above note. Agree with note above.  Patient remains encephalopathic and drowsy but with nonfocal exam.  TEE today shows a small PFO and lower extremity Dopplers have shown peroneal vein DVT.  TEE does not show any vegetations or intra-atrial clots.  Recommend IV heparin drip stroke protocol as patient may have had paradoxical embolism.  She may also need prolonged cardiac monitoring for paroxysmal A-fib after discharge.  We also need to consider elective endovascular PFO closure later the patient shows substantial improvement.  Long discussion with patient, son and daughter at the bedside as well as with Dr.Grunz and answered questions.  Greater than 50% time during this 35-minute visit was spent in counseling and coordination of care about her multiple embolic strokes and discussion about  paradoxical embolism and DVT and PFO and answering questions.  Antony Contras, MD Medical Director [facility] Pager: [phone] 08/05/2022 3:47 PM

## 2022-08-05 NOTE — CV Procedure (Signed)
     TRANSESOPHAGEAL ECHOCARDIOGRAM   NAME:  Sarah Walsh   MRN: [id number] DOB:  December 12, 1955   ADMIT DATE: 08/02/2022  INDICATIONS: CVA  PROCEDURE:   Informed consent was obtained prior to the procedure. The risks, benefits and alternatives for the procedure were discussed and the patient's daughter comprehended these risks (patient unable to consent).  Risks include, but are not limited to, cough, sore throat, vomiting, nausea, somnolence, esophageal and stomach trauma or perforation, bleeding, low blood pressure, aspiration, pneumonia, infection, trauma to the teeth and death.    After a procedural time-out, the oropharynx was anesthetized and the patient was sedated by the anesthesia service. The transesophageal probe was inserted in the esophagus and stomach without difficulty and multiple views were obtained. Anesthesia was monitored by Dr Doroteo Glassman and Richardean Canal, CRNA.    COMPLICATIONS:    There were no immediate complications.  FINDINGS:  No thrombus or vegetation seen.  Small PFO   [place]  [address] [place], [place] [address] [phone]   4:06 PM

## 2022-08-05 NOTE — Progress Notes (Signed)
ANTICOAGULATION CONSULT NOTE - Initial Consult  Pharmacy Consult for Heparin Indication:  paradoxical embolism , peroneal vein DVT  Allergies  Allergen Reactions   Statins Other (See Comments)    myalgia Other reaction(s): Other (See Comments) Other Reaction(s): Other  myalgia Other Reaction(s): Other  myalgia    Codeine Itching and Other (See Comments)    Patient Measurements: Height: '5\' 3"'$  (160 cm) Weight: 72.6 kg (160 lb) IBW/kg (Calculated) : 52.4 Heparin Dosing Weight: 68 kg  Vital Signs: Temp: 99.4 F (37.4 C) (10/13 1535) Temp Source: Axillary (10/13 1535) BP: 133/89 (10/13 1535) Pulse Rate: 101 (10/13 1535)  Labs: Recent Labs    08/02/22 1902 08/02/22 1902 08/03/22 0410 08/03/22 1832 08/04/22 0140 08/05/22 0314  HGB  --    < > 11.7*  --  12.2 13.1  HCT  --   --  35.1*  --  35.0* 39.1  PLT 278  --  232  --  215 213  APTT 23*  28  --   --   --   --   --   LABPROT 15.8*  15.5*  --   --   --   --   --   INR 1.3*  1.2  --   --   --   --   --   HEPARINUNFRC  --   --   --  <0.10*  --   --   CREATININE  --   --  0.66  --  0.63 0.87   < > = values in this interval not displayed.    Estimated Creatinine Clearance: 59.1 mL/min (by C-G formula based on SCr of 0.87 mg/dL).   Medical History: Past Medical History:  Diagnosis Date   Anemia    Arthritis    bilateral knees   Colon polyp 05/29/2019   Depression    on meds-situational anxiety   Diabetes mellitus without complication ([place])    on meds   Hyperlipemia    on meds   Hypertension    hx of    Osteopenia 04/23/2021   DEXA 03/2021: lowest T = -2.0, osteeopenia. Recheck 2 years. Ca/D    Medications:  Medications Prior to Admission  Medication Sig Dispense Refill Last Dose   Ascorbic Acid (VITAMIN C PO) Take 2,000 mg by mouth daily.   08/01/2022   B Complex Vitamins (B COMPLEX PO) Take 1 capsule by mouth daily.   07/31/2022   cetirizine (ZYRTEC ALLERGY) 10 MG tablet Take 1 tablet (10 mg total) by  mouth at bedtime. 60 tablet 0 07/31/2022   Cholecalciferol 25 MCG (1000 UT) tablet Take 1,000 Units by mouth 2 (two) times daily.   Past Week   diclofenac (VOLTAREN) 75 MG EC tablet Take 75 mg by mouth 2 (two) times daily as needed for pain.   Past Week   diphenhydrAMINE (BENADRYL) 25 MG tablet Take 25 mg by mouth at bedtime.   07/31/2022   ezetimibe (ZETIA) 10 MG tablet Take 1 tablet by mouth once daily (Patient taking differently: Take 10 mg by mouth daily.) 90 tablet 0 07/31/2022   FLUoxetine (PROZAC) 10 MG capsule Take 10 mg by mouth daily.   07/31/2022   gabapentin (NEURONTIN) 300 MG capsule Take 1 capsule by mouth at bedtime (Patient taking differently: Take 300 mg by mouth at bedtime.) 90 capsule 0 07/31/2022   hydrochlorothiazide (MICROZIDE) 12.5 MG capsule TAKE 1 CAPSULE BY MOUTH ONCE DAILY AS NEEDED (Patient taking differently: Take 12.5 mg by mouth daily.) 90  capsule 0 07/31/2022   ibuprofen (ADVIL) 200 MG tablet Take 400 mg by mouth every 6 (six) hours as needed for headache or mild pain.   unk   magnesium oxide (MAG-OX) 400 MG tablet Take 400 mg by mouth daily.   07/31/2022   metFORMIN (GLUCOPHAGE) 500 MG tablet Take 1 tablet (500 mg total) by mouth 2 (two) times daily with a meal. Schedule an appointment for further refills. 180 tablet 0 07/31/2022   montelukast (SINGULAIR) 10 MG tablet Take 1 tablet (10 mg total) by mouth at bedtime. 30 tablet 1 07/31/2022   Potassium 99 MG TABS Take 99 mg by mouth daily.   07/31/2022   Propylene Glycol (SYSTANE COMPLETE) 0.6 % SOLN Place 1 drop into both eyes daily as needed (dry eyes).   Past Week   solifenacin (VESICARE) 5 MG tablet Take 5 mg by mouth daily.   07/31/2022   triamcinolone cream (KENALOG) 0.1 % Apply 1 Application topically daily as needed for itching.   Past Week    Assessment: 66 y.o. F with recent diagnosis of lymphoma, recent MRSA infection.  Known to pharmacy from previous heparin dosing for peroneal vein DVT - held due to bleeding risk.  Now found to have paroxysmal embolism and neurology ordering heparin to be restarted with no bolus and lower goal. CBC stable.  Goal of Therapy:  Heparin level 0.3-0.5 units/ml Monitor platelets by anticoagulation protocol: Yes   Plan:  Start heparin gtt at 1200 units/hr. No bolus. Will f/u heparin level in 8 hours Daily heparin level and CBC  Sherlon Handing, PharmD, BCPS Please see amion for complete clinical pharmacist phone list 08/05/2022,3:57 PM

## 2022-08-05 NOTE — Interval H&P Note (Signed)
History and Physical Interval Note:  08/05/2022 1:05 PM  Sarah Walsh  has presented today for surgery, with the diagnosis of stroke and sepsis.  The various methods of treatment have been discussed with the patient and family. After consideration of risks, benefits and other options for treatment, the patient has consented to  Procedure(s): TRANSESOPHAGEAL ECHOCARDIOGRAM (TEE) (N/A) as a surgical intervention.  The patient's history has been reviewed, patient examined, no change in status, stable for surgery.  I have reviewed the patient's chart and labs.  Questions were answered to the patient's satisfaction.     Donato Heinz

## 2022-08-05 NOTE — Anesthesia Preprocedure Evaluation (Addendum)
Anesthesia Evaluation  Patient identified by MRN, date of birth, ID band Patient awake    Reviewed: Allergy & Precautions, NPO status , Patient's Chart, lab work & pertinent test results  Airway Mallampati: Unable to assess  TM Distance: >3 FB Neck ROM: Full    Dental  (+) Partial Upper   Pulmonary former smoker Quit smoking 1985   Pulmonary exam normal breath sounds clear to auscultation       Cardiovascular hypertension, Pt. on medications Normal cardiovascular exam Rhythm:Regular Rate:Normal  Echo 07/2022 1. Left ventricular ejection fraction, by estimation, is 60 to 65%. The  left ventricle has normal function. The left ventricle has no regional  wall motion abnormalities. There is mild concentric left ventricular  hypertrophy. Left ventricular diastolic  parameters are consistent with Grade I diastolic dysfunction (impaired  relaxation).  2. Right ventricular systolic function is normal. The right ventricular  size is normal. Tricuspid regurgitation signal is inadequate for assessing  PA pressure.  3. The mitral valve is normal in structure. No evidence of mitral valve  regurgitation. No evidence of mitral stenosis.  4. The aortic valve is tricuspid. There is mild calcification of the  aortic valve. Aortic valve regurgitation is not visualized. No aortic  stenosis is present.  5. The inferior vena cava is normal in size with greater than 50%  respiratory variability, suggesting right atrial pressure of 3 mmHg.    Neuro/Psych  PSYCHIATRIC DISORDERS  Depression    recent diagnosis of lymphoma 13 days ago presenting now with a 5-day history of gradually worsening alteration in mental status, impairment of her attention span, nonsensical speech, and lethargy. She has a nonfocal neurological examination within the limits of her impaired attention span/alteration in mental status. Her presentation  @ this time correlates  with toxic/infectious encephalopathy which can be due to her recent MRSA infection. MRI Brain multiple small embolic cortical and subcortical infarcts@ MCA/PCA and ACA/MCA and subcortical areas bilateral . Her Etiology likely from central cardiac source possibly due to suspected bacterial endocarditis versus paradoxical embolism given peroneal vein DVT on lower extremity Dopplers.   Seen in preop, minimally responsive to voice, follows some commands. Per daughter and notes, her mental status has progressively declined over the last 5 days. Primary team still would like TEE done today to help guide their management. CVA, Residual Symptoms    GI/Hepatic negative GI ROS, Neg liver ROS,,,  Endo/Other  diabetes, Well Controlled, Type 2, Oral Hypoglycemic Agents  a1c 6  Renal/GU negative Renal ROS  negative genitourinary   Musculoskeletal  (+) Arthritis , Osteoarthritis,    Abdominal   Peds  Hematology  (+) Blood dyscrasia Hb 13 Recent diagnosis of lymphoma    Anesthesia Other Findings   Reproductive/Obstetrics negative OB ROS                             Anesthesia Physical Anesthesia Plan  ASA: 3  Anesthesia Plan: MAC   Post-op Pain Management:    Induction:   PONV Risk Score and Plan: 2 and Propofol infusion and TIVA  Airway Management Planned: Natural Airway and Simple Face Mask  Additional Equipment: None  Intra-op Plan:   Post-operative Plan:   Informed Consent: I have reviewed the patients History and Physical, chart, labs and discussed the procedure including the risks, benefits and alternatives for the proposed anesthesia with the patient or authorized representative who has indicated his/her understanding and acceptance.  Plan Discussed with: CRNA  Anesthesia Plan Comments:         Anesthesia Quick Evaluation

## 2022-08-05 NOTE — Progress Notes (Signed)
Pharmacy Antibiotic Note  Sarah Walsh is a 66 y.o. female on day # 3 Vancomycin, Cefepime and Metronidazole for sepsis coverage with concern for endocarditis. Pharmacy has been consulted for Vancomycin and Cefepime dosing.  Vanc levels done today:   - "peak" = 12 mcg/ml, but drawn late, about 6 hrs after dose infused  - trough = 8 mcg/ml  - calculated AUC is 316, subtherapeutic.  TEE today, no thrombus or vegetation seen. Small PFO. Newly diagnosed lymphoma. E coli UTI, pansensitive.  Plan: Increase Vancomycin from 750 mg to 1gm IV q12h. Goal AUC 400-550. Expected AUC: 422 Continue Cefepime 2gm IV q8h. Also on Metronidazole 500 mg IV q12h. Follow renal function, culture data, clinical progress and antibiotic plans.   Height: '5\' 3"'$  (160 cm) Weight: 72.6 kg (160 lb) IBW/kg (Calculated) : 52.4  Temp (24hrs), Avg:98.7 F (37.1 C), Min:98 F (36.7 C), Max:100.2 F (37.9 C)  Recent Labs  Lab 08/02/22 1351 08/02/22 1400 08/02/22 1516 08/02/22 1902 08/02/22 2230 08/03/22 0410 08/04/22 0140 08/05/22 0314 08/05/22 1029 08/05/22 1536  WBC 54.9* 54.5*  --   --   --  48.8* 48.2* 46.3*  --   --   CREATININE 0.75  --   --   --   --  0.66 0.63 0.87  --   --   LATICACIDVEN  --   --  1.7 2.1* 2.0*  --   --   --   --   --   VANCOTROUGH  --   --   --   --   --   --   --   --   --  8*  VANCOPEAK  --   --   --   --   --   --   --   --  12*  --     Estimated Creatinine Clearance: 59.1 mL/min (by C-G formula based on SCr of 0.87 mg/dL).    Allergies  Allergen Reactions   Statins Other (See Comments)    myalgia Other reaction(s): Other (See Comments) Other Reaction(s): Other  myalgia Other Reaction(s): Other  myalgia    Codeine Itching and Other (See Comments)    Antimicrobials this admission: Vancomycin 10/10>> Cefepime 10/10 >> Metronidazole 10/10 >>  Dose adjustments this admission: 10/13:  Vanc 750 mg > 1gm IV q12h based on levels  Microbiology results: 10/10  blood: no growth x 3 days to date 10/10 urine: E coli - pansensitive 10/10 COVID and flu: negative  Thank you for allowing pharmacy to be a part of this patient's care.  Arty Baumgartner, [place] 08/05/2022 7:34 PM

## 2022-08-06 ENCOUNTER — Inpatient Hospital Stay (HOSPITAL_COMMUNITY): Payer: Medicare HMO

## 2022-08-06 DIAGNOSIS — I639 Cerebral infarction, unspecified: Secondary | ICD-10-CM | POA: Diagnosis not present

## 2022-08-06 DIAGNOSIS — G9341 Metabolic encephalopathy: Secondary | ICD-10-CM | POA: Diagnosis not present

## 2022-08-06 DIAGNOSIS — I82451 Acute embolism and thrombosis of right peroneal vein: Secondary | ICD-10-CM | POA: Diagnosis not present

## 2022-08-06 DIAGNOSIS — I82452 Acute embolism and thrombosis of left peroneal vein: Secondary | ICD-10-CM

## 2022-08-06 DIAGNOSIS — Q2112 Patent foramen ovale: Secondary | ICD-10-CM

## 2022-08-06 DIAGNOSIS — R4182 Altered mental status, unspecified: Secondary | ICD-10-CM | POA: Diagnosis not present

## 2022-08-06 DIAGNOSIS — A4102 Sepsis due to Methicillin resistant Staphylococcus aureus: Secondary | ICD-10-CM | POA: Diagnosis not present

## 2022-08-06 LAB — BASIC METABOLIC PANEL
Anion gap: 11 (ref 5–15)
BUN: 11 mg/dL (ref 8–23)
CO2: 18 mmol/L — ABNORMAL LOW (ref 22–32)
Calcium: 7.9 mg/dL — ABNORMAL LOW (ref 8.9–10.3)
Chloride: 107 mmol/L (ref 98–111)
Creatinine, Ser: 0.72 mg/dL (ref 0.44–1.00)
GFR, Estimated: >60 mL/min (ref >60)
Glucose, Bld: 144 mg/dL — ABNORMAL HIGH (ref 70–99)
Potassium: 3.2 mmol/L — ABNORMAL LOW (ref 3.5–5.1)
Sodium: 136 mmol/L (ref 135–145)

## 2022-08-06 LAB — CBC
HCT: 37.5 % (ref 36.0–46.0)
Hemoglobin: 13.1 g/dL (ref 12.0–15.0)
MCH: 32.7 pg (ref 26.0–34.0)
MCHC: 34.9 g/dL (ref 30.0–36.0)
MCV: 93.5 fL (ref 80.0–100.0)
Platelets: 196 10*3/uL (ref 150–400)
RBC: 4.01 MIL/uL (ref 3.87–5.11)
RDW: 16.4 % — ABNORMAL HIGH (ref 11.5–15.5)
WBC: 55.1 10*3/uL (ref 4.0–10.5)
nRBC: 0 % (ref 0.0–0.2)

## 2022-08-06 LAB — BLOOD GAS, VENOUS
Acid-base deficit: 0.3 mmol/L (ref 0.0–2.0)
Bicarbonate: 21.2 mmol/L (ref 20.0–28.0)
Drawn by: 66107
O2 Saturation: 89.8 %
Patient temperature: 37
pCO2, Ven: 26 mmHg — ABNORMAL LOW (ref 44–60)
pH, Ven: 7.52 — ABNORMAL HIGH (ref 7.25–7.43)
pO2, Ven: 55 mmHg — ABNORMAL HIGH (ref 32–45)

## 2022-08-06 LAB — HEPARIN LEVEL (UNFRACTIONATED)
Heparin Unfractionated: 0.12 IU/mL — ABNORMAL LOW (ref 0.30–0.70)
Heparin Unfractionated: 0.23 IU/mL — ABNORMAL LOW (ref 0.30–0.70)
Heparin Unfractionated: 0.33 IU/mL (ref 0.30–0.70)

## 2022-08-06 MED ORDER — METOPROLOL TARTRATE 5 MG/5ML IV SOLN
2.5000 mg | Freq: Four times a day (QID) | INTRAVENOUS | Status: DC
  Administered 2022-08-06 – 2022-08-09 (×12): 2.5 mg via INTRAVENOUS
  Filled 2022-08-06 (×11): qty 5

## 2022-08-06 MED ORDER — ACETAMINOPHEN 650 MG RE SUPP
650.0000 mg | Freq: Four times a day (QID) | RECTAL | Status: DC
  Administered 2022-08-07 – 2022-08-08 (×7): 650 mg via RECTAL
  Filled 2022-08-06 (×7): qty 1

## 2022-08-06 MED ORDER — HEPARIN (PORCINE) 25000 UT/250ML-% IV SOLN
1100.0000 [IU]/h | INTRAVENOUS | Status: DC
  Administered 2022-08-06 – 2022-08-10 (×6): 1500 [IU]/h via INTRAVENOUS
  Administered 2022-08-10: 1400 [IU]/h via INTRAVENOUS
  Administered 2022-08-11: 1250 [IU]/h via INTRAVENOUS
  Administered 2022-08-12 – 2022-08-14 (×3): 1100 [IU]/h via INTRAVENOUS
  Filled 2022-08-06 (×11): qty 250

## 2022-08-06 MED ORDER — POTASSIUM CHLORIDE 10 MEQ/100ML IV SOLN
10.0000 meq | INTRAVENOUS | Status: AC
  Administered 2022-08-06 (×4): 10 meq via INTRAVENOUS
  Filled 2022-08-06 (×4): qty 100

## 2022-08-06 MED ORDER — LACTATED RINGERS IV SOLN: INTRAVENOUS | Status: DC

## 2022-08-06 MED ORDER — STERILE WATER FOR INJECTION IV SOLN
INTRAVENOUS | Status: DC
  Filled 2022-08-06 (×2): qty 1000

## 2022-08-06 MED ORDER — ACETAMINOPHEN 325 MG PO TABS
650.0000 mg | ORAL_TABLET | Freq: Four times a day (QID) | ORAL | Status: DC
  Administered 2022-08-08 – 2022-08-09 (×4): 650 mg via ORAL
  Filled 2022-08-06 (×5): qty 2

## 2022-08-06 NOTE — Progress Notes (Addendum)
Date and time results received: 08/06/22 0245 (use smartphrase ".now" to insert current time)  Test: WBC Critical Value: 56.6  Name of Provider Notified: E. CHEN  Orders Received? Or Actions Taken?:  Per protocol

## 2022-08-06 NOTE — Progress Notes (Signed)
EEG complete - results pending 

## 2022-08-06 NOTE — Progress Notes (Signed)
Progress Note  Patient: Sarah Walsh GMW:[id number] DOB: 08/02/1956  DOA: 08/02/2022  DOS: 08/06/2022    Brief hospital course: Sarah Walsh is a 66 y.o. female with a history of T2DM, HLD, HTN, recent cervical LN biopsy with +lymphoma reportedly T cell, not yet started on treatment, presented to ED 10/10 with lethargy, confusion, anorexia, gait instability. MRI brain was positive for scattered cortical and subcortical infarcts. She was admitted, also noted to have fevers, WBC 44.0H with eosinophilic predominance.   Assessment and Plan: Sepsis: Patient met sepsis criteria on admission with tachycardia, fevers, leukocytosis, lactic acidosis, source unclear.   -Follow blood cultures. Note PCT is undetectable x2. DDx fever includes due to lymphoma, less likely blood clot. ?if severity of infarcts contributed.  - DC cefepime due to concern for lowering seizure threshold. Replace with ceftriaxone 2g q24h, continue vancomycin (reported history of MRSA) and flagyl empirically though cultures remain negative   Acute metabolic encephalopathy: Most likely related to widespread embolic CVAs.  - EEG with nonspecific changes without seizure activity noted. Stopped cefepime. Would defer further evaluations to neurology. Otherwise, would continue supportive care.   Acute hypoxic respiratory failure: With opacities on CT, we've been covering w/broad abx which would cover pneumonia.  - Check VBG, continue supplemental oxygen.  - She's maintaining airway at this time. Is currently full code.   Extensive bilateral cortical and subcortical ischemic CVAs: MRI brain showed numerous diffuse bilateral foci of abnormal diffusion restriction in a pattern most suggestive of embolic shower but could also indicate watershed ischemia. Due to worsening lethargy and exam, repeat MRI ordered today and confirms worsening severity of infarct burden.  - Per my discussions with neurology, anticoagulation is not contraindicated  and can be continued. PFO found on TEE. In conjunction with know DVT (albeit distal) and embolic distribution, started heparin, goal level 0.3-0.5.   Metabolic acidosis: Likely contributing to tachypnea in addition to opacities.  - Improving. Change to bicarb infusion, does not appear volume overloaded currently.    HTN:  - BP goal per neurology   Acute RLE peroneal DVT - CTA negative for PE.  - Heparin as discussed above.  - Interestingly, per my discussion with heme/onc, Dr. Lorenso Courier, lymphomas are not classically associated with hypercoagulability. The severity of leukocytosis is also not near the expected threshold to cause leukostasis.    T-cell lymphoma: New diagnosis, has not been started on treatment yet.   - Oncology, Dr. Lorenso Courier consulted. Of course the patient is not currently a candidate for treatment and, given her normal cell counts otherwise, doubt there'd be any benefit to pulsed steroids at this time. There would certainly be risks as well. The family is aware of this discussion. - There is no further work up or treatment currently planned as an inpatient for this issue.    Eosinophilic leukocytosis:  - I've got to think this is related to her T cell lymphoma. Note subtypes of T cell lymphomas can aberrantly express IL-5.  - Work up has not suggested nephritis, hepatitis, She has no history of this prior to diagnosis, no ongoing drug/allergic reaction, no parasitic infection detected. Could be expected at the time of her drug eruption after Tx for MRSA recently though that agent was stopped and rash has resolved durably. HIV NR - Will ask for pulmonology opinion, ?possibility of eosinophilic pneumonia with bilateral hazy opacities on CT. Especially in light of her tachypnea/hypoxia.  - To exhaust the DDx will check fungitell  Subjective: Pt not meaningfully responsive.  No events overnight. Recurrently febrile this PM.  Objective: Vitals:   08/06/22 0405 08/06/22 0820 08/06/22  1219 08/06/22 1615  BP: (!) 150/93 (!) 148/98 (!) 141/91 (!) 143/100  Pulse: (!) 115 (!) 112 (!) 117 (!) 110  Resp: (!) 29 (!) 30 (!) 30 (!) 30  Temp: 99 F (37.2 C)  100.3 F (37.9 C) (!) 101.4 F (38.6 C)  TempSrc: Axillary  Axillary Axillary  SpO2: 93%   92%  Weight:      Height:       Gen: 66 y.o. female in no distress Pulm: Tachypnea without crackles, wheezing or stridor.  CV: Regular tachycardia without murmur, rub, or gallop. No JVD, no dependent edema. GI: Abdomen soft, non-tender, non-distended, with normoactive bowel sounds.  Ext: Warm, no deformities Skin: No rashes, lesions or ulcers on visualized skin. Neuro: Not cooperative for exam, responsive to sternal rub. Withdraws to pain.  Data Personally reviewed: CBC: Recent Labs  Lab 08/02/22 1400 08/02/22 1902 08/03/22 0410 08/04/22 0140 08/05/22 0314 08/06/22 0133  WBC 54.5*  --  48.8* 48.2* 46.3* 55.1*  NEUTROABS 5.8  --   --   --   --   --   HGB 13.6  --  11.7* 12.2 13.1 13.1  HCT 39.7  --  35.1* 35.0* 39.1 37.5  MCV 94.3  --  95.6 92.1 93.3 93.5  PLT 296 278 232 215 213 509   Basic Metabolic Panel: Recent Labs  Lab 08/02/22 1351 08/02/22 1902 08/03/22 0410 08/04/22 0140 08/05/22 0314 08/06/22 0133  NA 134*  --  134* 133* 133* 136  K 3.4*  --  3.4* 3.2* 3.2* 3.2*  CL 99  --  100 101 104 107  CO2 23  --  23 20* 15* 18*  GLUCOSE 148*  --  136* 125* 157* 144*  BUN 11  --  7* $R'8 8 11  'vP$ CREATININE 0.75  --  0.66 0.63 0.87 0.72  CALCIUM 9.2  --  8.2* 8.1* 8.0* 7.9*  MG  --   --  1.9  --   --   --   PHOS  --  2.6  --   --   --   --    GFR: Estimated Creatinine Clearance: 64.3 mL/min (by C-G formula based on SCr of 0.72 mg/dL). Liver Function Tests: Recent Labs  Lab 08/02/22 1351  AST 22  ALT 14  ALKPHOS 118  BILITOT 0.9  PROT 7.5  ALBUMIN 3.2*   No results for input(s): "LIPASE", "AMYLASE" in the last 168 hours. Recent Labs  Lab 08/02/22 1902  AMMONIA 29   Coagulation Profile: Recent  Labs  Lab 08/02/22 1902  INR 1.3*  1.2   Cardiac Enzymes: No results for input(s): "CKTOTAL", "CKMB", "CKMBINDEX", "TROPONINI" in the last 168 hours. BNP (last 3 results) No results for input(s): "PROBNP" in the last 8760 hours. HbA1C: No results for input(s): "HGBA1C" in the last 72 hours. CBG: Recent Labs  Lab 08/02/22 1506  GLUCAP 149*   Lipid Profile: No results for input(s): "CHOL", "HDL", "LDLCALC", "TRIG", "CHOLHDL", "LDLDIRECT" in the last 72 hours. Thyroid Function Tests: No results for input(s): "TSH", "T4TOTAL", "FREET4", "T3FREE", "THYROIDAB" in the last 72 hours. Anemia Panel: No results for input(s): "VITAMINB12", "FOLATE", "FERRITIN", "TIBC", "IRON", "RETICCTPCT" in the last 72 hours. Urine analysis:    Component Value Date/Time   COLORURINE YELLOW 08/02/2022 2137   APPEARANCEUR HAZY (A) 08/02/2022 2137   LABSPEC 1.012 08/02/2022 2137   PHURINE 5.0 08/02/2022 2137  GLUCOSEU NEGATIVE 08/02/2022 2137   HGBUR NEGATIVE 08/02/2022 2137   [place] NEGATIVE 08/02/2022 2137   KETONESUR 5 (A) 08/02/2022 2137   PROTEINUR NEGATIVE 08/02/2022 2137   NITRITE NEGATIVE 08/02/2022 2137   LEUKOCYTESUR TRACE (A) 08/02/2022 2137   Recent Results (from the past 240 hour(s))  Resp Panel by RT-PCR (Flu A&B, Covid) Anterior Nasal Swab     Status: None   Collection Time: 08/02/22  3:10 PM   Specimen: Anterior Nasal Swab  Result Value Ref Range Status   SARS Coronavirus 2 by RT PCR NEGATIVE NEGATIVE Final    Comment: (NOTE) SARS-CoV-2 target nucleic acids are NOT DETECTED.  The SARS-CoV-2 RNA is generally detectable in upper respiratory specimens during the acute phase of infection. The lowest concentration of SARS-CoV-2 viral copies this assay can detect is 138 copies/mL. A negative result does not preclude SARS-Cov-2 infection and should not be used as the sole basis for treatment or other patient management decisions. A negative result may occur with  improper  specimen collection/handling, submission of specimen other than nasopharyngeal swab, presence of viral mutation(s) within the areas targeted by this assay, and inadequate number of viral copies(<138 copies/mL). A negative result must be combined with clinical observations, patient history, and epidemiological information. The expected result is Negative.  Fact Sheet for Patients:  EntrepreneurPulse.com.au  Fact Sheet for Healthcare Providers:  IncredibleEmployment.be  This test is no t yet approved or cleared by the [place] FDA and  has been authorized for detection and/or diagnosis of SARS-CoV-2 by FDA under an Emergency Use Authorization (EUA). This EUA will remain  in effect (meaning this test can be used) for the duration of the COVID-19 declaration under Section 564(b)(1) of the Act, 21 U.S.C.section 360bbb-3(b)(1), unless the authorization is terminated  or revoked sooner.       Influenza A by PCR NEGATIVE NEGATIVE Final   Influenza B by PCR NEGATIVE NEGATIVE Final    Comment: (NOTE) The Xpert Xpress SARS-CoV-2/FLU/RSV plus assay is intended as an aid in the diagnosis of influenza from Nasopharyngeal swab specimens and should not be used as a sole basis for treatment. Nasal washings and aspirates are unacceptable for Xpert Xpress SARS-CoV-2/FLU/RSV testing.  Fact Sheet for Patients: EntrepreneurPulse.com.au  Fact Sheet for Healthcare Providers: IncredibleEmployment.be  This test is not yet approved or cleared by the [place] FDA and has been authorized for detection and/or diagnosis of SARS-CoV-2 by FDA under an Emergency Use Authorization (EUA). This EUA will remain in effect (meaning this test can be used) for the duration of the COVID-19 declaration under Section 564(b)(1) of the Act, 21 U.S.C. section 360bbb-3(b)(1), unless the authorization is terminated or revoked.  Performed at  [facility], [place] [address], [place], [place] [address]   Blood culture (routine x 2)     Status: None (Preliminary result)   Collection Time: 08/02/22  3:16 PM   Specimen: BLOOD  Result Value Ref Range Status   Specimen Description BLOOD LEFT ANTECUBITAL  Final   Special Requests   Final    BOTTLES DRAWN AEROBIC AND ANAEROBIC Blood Culture results may not be optimal due to an inadequate volume of blood received in culture bottles   Culture   Final    NO GROWTH 4 DAYS Performed at [facility], [place] [address] [place], [place] [address]    Report Status PENDING  Incomplete  Blood culture (routine x 2)     Status: None (Preliminary result)   Collection Time:  08/02/22  7:02 PM   Specimen: BLOOD  Result Value Ref Range Status   Specimen Description BLOOD LEFT ANTECUBITAL  Final   Special Requests   Final    BOTTLES DRAWN AEROBIC AND ANAEROBIC Blood Culture adequate volume   Culture   Final    NO GROWTH 4 DAYS Performed at [facility], [place] [address] [place], [place] [address]    Report Status PENDING  Incomplete  Urine Culture     Status: Abnormal   Collection Time: 08/02/22  9:37 PM   Specimen: Urine, Clean Catch  Result Value Ref Range Status   Specimen Description URINE, CLEAN CATCH  Final   Special Requests   Final    NONE Performed at [facility], [place] [address], [place], [place] [address]    Culture 10,000 COLONIES/mL ESCHERICHIA COLI (A)  Final   Report Status 08/05/2022 FINAL  Final   Organism ID, Bacteria ESCHERICHIA COLI (A)  Final      Susceptibility   Escherichia coli - MIC*    AMPICILLIN 8 SENSITIVE Sensitive     CEFAZOLIN <=4 SENSITIVE Sensitive     CEFEPIME <=0.12 SENSITIVE Sensitive     CEFTRIAXONE <=0.25 SENSITIVE Sensitive     CIPROFLOXACIN <=0.25 SENSITIVE Sensitive     GENTAMICIN <=1 SENSITIVE Sensitive     IMIPENEM <=0.25 SENSITIVE Sensitive     NITROFURANTOIN <=16 SENSITIVE Sensitive     TRIMETH/SULFA <=20 SENSITIVE  Sensitive     AMPICILLIN/SULBACTAM 4 SENSITIVE Sensitive     PIP/TAZO <=4 SENSITIVE Sensitive     * 10,000 COLONIES/mL ESCHERICHIA COLI     EEG adult  Result Date: 08/06/2022 Lora Havens, MD     08/06/2022  8:43 AM Patient Name: SHAINNA FAUX MRN: [id number] Epilepsy Attending: Lora Havens Referring Physician/Provider: Donnetta Simpers, MD Date: 08/06/2022 Duration: 23.12 mins Patient history: 66 year old female with alteration in mental status. EEG to evaluate for seizure Level of alertness:  lethargic AEDs during EEG study: None Technical aspects: This EEG study was done with scalp electrodes positioned according to the 10-20 International system of electrode placement. Electrical activity was reviewed with band pass filter of 1-$RemoveBef'70Hz'FkqQYhgSiK$ , sensitivity of 7 uV/mm, display speed of 25mm/sec with a $Remo'60Hz'XifRc$  notched filter applied as appropriate. EEG data were recorded continuously and digitally stored.  Video monitoring was available and reviewed as appropriate. Description: EEG showed continuous generalized 3 to 6 Hz theta-delta slowing. Generalized periodic discharges with triphasic morphology at  1.5-$Remov'2Hz'jXPvXi$  were also noted, more prominent when awake/stimulated. Hyperventilation and photic stimulation were not performed.   ABNORMALITY - Periodic discharges with triphasic morphology, generalized ( GPDs) - Continuous slow, generalized IMPRESSION: This study showed generalized periodic discharges with triphasic morphology which can be on the ictal-interictal continuum. However, the morphology, frequency and reactivity to stimulation is more commonly indicative of toxic-metabolic causes. Additionally there is moderate to severe diffuse encephalopathy, nonspecific etiology. No seizures were seen throughout the recording. Lora Havens   MR BRAIN WO CONTRAST  Result Date: 08/05/2022 CLINICAL DATA:  History of stroke, mental status change EXAM: MRI HEAD WITHOUT CONTRAST TECHNIQUE: Multiplanar, multiecho  pulse sequences of the brain and surrounding structures were obtained without intravenous contrast. COMPARISON:  CT head 1 day prior, brain MRI 08/02/2022 FINDINGS: Brain: Again seen are extensive acute infarcts throughout both cerebral and cerebellar hemispheres across bilateral ACA, MCA, and PCA distributions, overall increased in extent compared to the MRI from 3 days prior. There is associated FLAIR signal abnormality and  a few punctate foci of SWI signal dropout suspicious for petechial hemorrhage but no organized hematoma or mass effect. The foci of intrinsic T1 hyperintensity in the occipital lobes may be due to petechial hemorrhage. There is no acute intracranial hemorrhage or extra-axial fluid collection Background parenchymal volume is normal. The ventricles are stable in size. There is no mass lesion. There is no mass effect or midline shift. Vascular: Normal flow voids. Skull and upper cervical spine: Normal marrow signal. Sinuses/Orbits: The paranasal sinuses are clear. The globes and orbits are unremarkable. Other: None. IMPRESSION: Extensive acute infarcts throughout both cerebral and cerebellar hemispheres across multiple vascular distributions again favored embolic with differential including watershed ischemia, increased in extent since the MRI from 3 days prior. Suspect some petechial hemorrhage but no hematoma or mass effect. Electronically Signed   By: Valetta Mole M.D.   On: 08/05/2022 19:45   ECHO TEE  Result Date: 08/05/2022    TRANSESOPHOGEAL ECHO REPORT   Patient Name:   STARR URIAS Date of Exam: 08/05/2022 Medical Rec #:  [id number]     Height:       63.0 in Accession #:    [id number]    Weight:       160.0 lb Date of Birth:  March 24, 1956     BSA:          1.759 m Patient Age:    66 years      BP:           125/78 mmHg Patient Gender: F             HR:           99 bpm. Exam Location:  Inpatient Procedure: Transesophageal Echo, 3D Echo, Color Doppler, Cardiac Doppler and             Saline Contrast Bubble Study Indications:     Stroke i63.9  History:         Patient has prior history of Echocardiogram examinations, most                  recent 08/03/2022. Risk Factors:Hypertension, Diabetes and                  Dyslipidemia.  Sonographer:     Raquel Sarna Senior RDCS Referring Phys:  [id number] Margie Billet Diagnosing Phys: Oswaldo Milian MD PROCEDURE: After discussion of the risks and benefits of a TEE, an informed consent was obtained from the patient. The transesophogeal probe was passed without difficulty through the esophogus of the patient. Sedation performed by different physician. The patient was monitored while under deep sedation. Anesthestetic sedation was provided intravenously by Anesthesiology: 70mg  of Propofol. The patient developed no complications during the procedure. IMPRESSIONS  1. Left ventricular ejection fraction, by estimation, is 55 to 60%. The left ventricle has normal function.  2. Right ventricular systolic function is normal. The right ventricular size is normal. There is moderately elevated pulmonary artery systolic pressure.  3. No left atrial/left atrial appendage thrombus was detected.  4. The mitral valve is normal in structure. Mild mitral valve regurgitation.  5. The aortic valve is tricuspid. Aortic valve regurgitation is not visualized.  6. Evidence of atrial level shunting detected by color flow Doppler. Agitated saline contrast bubble study was positive with shunting observed within 3-6 cardiac cycles suggestive of interatrial shunt. Few bubbles seen in left atrium after patient coughed, suggesting small PFO. Small PFO confirmed visually with left to right shunting.  7. No vegetation  seen FINDINGS  Left Ventricle: Left ventricular ejection fraction, by estimation, is 55 to 60%. The left ventricle has normal function. The left ventricular internal cavity size was normal in size. Right Ventricle: The right ventricular size is normal. No increase in right  ventricular wall thickness. Right ventricular systolic function is normal. There is moderately elevated pulmonary artery systolic pressure. The tricuspid regurgitant velocity is 3.35 m/s, and with an assumed right atrial pressure of 3 mmHg, the estimated right ventricular systolic pressure is 53.7 mmHg. Left Atrium: Left atrial size was normal in size. No left atrial/left atrial appendage thrombus was detected. Right Atrium: Right atrial size was normal in size. Pericardium: Trivial pericardial effusion is present. Mitral Valve: The mitral valve is normal in structure. Mild mitral valve regurgitation. Tricuspid Valve: The tricuspid valve is normal in structure. Tricuspid valve regurgitation is mild. Aortic Valve: The aortic valve is tricuspid. Aortic valve regurgitation is not visualized. Aortic valve mean gradient measures 6.0 mmHg. Aortic valve peak gradient measures 10.2 mmHg. Pulmonic Valve: The pulmonic valve was grossly normal. Pulmonic valve regurgitation is not visualized. Aorta: The aortic root is normal in size and structure. IAS/Shunts: Evidence of atrial level shunting detected by color flow Doppler. Agitated saline contrast was given intravenously to evaluate for intracardiac shunting. Agitated saline contrast bubble study was positive with shunting observed within 3-6 cardiac cycles suggestive of interatrial shunt.  AORTIC VALVE AV Vmax:           160.00 cm/s AV Vmean:          112.000 cm/s AV VTI:            0.244 m AV Peak Grad:      10.2 mmHg AV Mean Grad:      6.0 mmHg LVOT Vmax:         115.00 cm/s LVOT Vmean:        83.500 cm/s LVOT VTI:          0.210 m LVOT/AV VTI ratio: 0.86 TRICUSPID VALVE TR Peak grad:   44.9 mmHg TR Vmax:        335.00 cm/s  SHUNTS Systemic VTI: 0.21 m Oswaldo Milian MD Electronically signed by Oswaldo Milian MD Signature Date/Time: 08/05/2022/4:04:01 PM    Final     Family Communication: Son, daughter, ex-husband at bedside  Disposition: Status is:  Inpatient Remains inpatient appropriate because: Critical illness Planned Discharge Destination:  TBD, guarded prognosis  Patrecia Pour, MD 08/06/2022 4:32 PM Page by [email or web address]

## 2022-08-06 NOTE — Progress Notes (Signed)
ANTICOAGULATION CONSULT NOTE - Follow Up Consult  Pharmacy Consult for heparin Indication:  multiple embolic strokes with paradoxical embolism and DVT  Labs: Recent Labs    08/03/22 1832 08/04/22 0140 08/04/22 0140 08/05/22 0314 08/06/22 0133 08/06/22 0944  HGB  --  12.2   < > 13.1 13.1  --   HCT  --  35.0*  --  39.1 37.5  --   PLT  --  215  --  213 196  --   HEPARINUNFRC <0.10*  --   --   --  0.12* 0.23*  CREATININE  --  0.63  --  0.87 0.72  --    < > = values in this interval not displayed.     Assessment: 66yo female subtherapeutic on heparin with initial cautious dosing for paradoxical embolism and DVT in setting of multiple embolic strokes. Heparin level returned at 0.23 (subtherapeutic).no infusion issues or signs of bleeding per RN.  Goal of Therapy:  Heparin level 0.3-0.5 units/ml   Plan:  Will increase heparin infusion by 1-2 units/kg/hr to 1500 units/hr and check level in 6 hours.  Monitor for signs/symptoms of bleeding Monitor CBC daily  Sandford Craze, PharmD. [facility] Acute Care PGY-1  08/06/2022 10:47 AM

## 2022-08-06 NOTE — Progress Notes (Signed)
ANTICOAGULATION CONSULT NOTE - Follow Up Consult  Pharmacy Consult for heparin Indication:  multiple embolic strokes with paradoxical embolism and DVT  Labs: Recent Labs    08/04/22 0140 08/05/22 0314 08/06/22 0133 08/06/22 0944 08/06/22 1751  HGB 12.2 13.1 13.1  --   --   HCT 35.0* 39.1 37.5  --   --   PLT 215 213 196  --   --   HEPARINUNFRC  --   --  0.12* 0.23* 0.33  CREATININE 0.63 0.87 0.72  --   --      Assessment: 66yo female subtherapeutic on heparin with initial cautious dosing for paradoxical embolism and DVT in setting of multiple embolic strokes.   Heparin level returned at 0.33, now at goal after rate adjustment earlier today. No infusion issues or signs of bleeding noted.   Goal of Therapy:  Heparin level 0.3-0.5 units/ml   Plan:  Continue heparin at 1500 units/hr Monitor for signs/symptoms of bleeding Monitor CBC daily with heparin level  Erin Hearing PharmD., BCPS Clinical Pharmacist 08/06/2022 6:53 PM

## 2022-08-06 NOTE — Procedures (Signed)
[name]  MRN: [id number]  Epilepsy Attending: Lora Havens  Referring Physician/Provider: Donnetta Simpers, MD  Date: 08/06/2022 Duration: 23.12 mins  Patient history: 66 year old female with alteration in mental status. EEG to evaluate for seizure  Level of alertness:  lethargic   AEDs during EEG study: None  Technical aspects: This EEG study was done with scalp electrodes positioned according to the 10-20 International system of electrode placement. Electrical activity was reviewed with band pass filter of 1-'70Hz'$ , sensitivity of 7 uV/mm, display speed of 51m/sec with a '60Hz'$  notched filter applied as appropriate. EEG data were recorded continuously and digitally stored.  Video monitoring was available and reviewed as appropriate.  Description: EEG showed continuous generalized 3 to 6 Hz theta-delta slowing. Generalized periodic discharges with triphasic morphology at  1.5-'2Hz'$  were also noted, more prominent when awake/stimulated. Hyperventilation and photic stimulation were not performed.     ABNORMALITY - Periodic discharges with triphasic morphology, generalized ( GPDs) - Continuous slow, generalized  IMPRESSION: This study showed generalized periodic discharges with triphasic morphology which can be on the ictal-interictal continuum. However, the morphology, frequency and reactivity to stimulation is more commonly indicative of toxic-metabolic causes. Additionally there is moderate to severe diffuse encephalopathy, nonspecific etiology. No seizures were seen throughout the recording.  Elis Sauber OBarbra Sarks

## 2022-08-06 NOTE — Progress Notes (Addendum)
OT Cancellation Note  Patient Details Name: Sarah Walsh MRN: [id number] DOB: 01/15/1956   Cancelled Treatment:    Reason Eval/Treat Not Completed: Patient not medically ready  OT will sign off at this time secondary to pt not being ready for active participation in therapy over the past 4 days.  MD please re-order when pt is appropriate. Thanks.  Liadan Guizar OTR/L 08/06/2022, 1:21 PM

## 2022-08-06 NOTE — Progress Notes (Addendum)
Brief note, seen again on PM rounds.   S: Has been tachypneic and tachycardic without change in oxygenation per RN report. Son at bedside states she responded "ok" when asked if it was ok to reposition her in bed earlier today. When I speak to her she opens eyes and tracks for a moment, then closes again. Moving legs bilaterally intermittently but following no commands.   O: Her lungs are clear posterolaterally, remains tachypneic Regular tachycardia in mid 100's, ST on monitor, no JVD or pitting edema.  Soft, NT, no palpable bladder. Dark urine in canister.   Telemetry: Has had several runs of SVT.  VBG: 7.52/26/55.  CXR 1v: 08/06/2022 1. Increased central pulmonary vascular congestion. 2. Stable bilateral pulmonary nodules.   EEG 08/06/2022 This study showed generalized periodic discharges with triphasic morphology which can be on the ictal-interictal continuum. However, the morphology, frequency and reactivity to stimulation is more commonly indicative of toxic-metabolic causes. Additionally there is moderate to severe diffuse encephalopathy, nonspecific etiology. No seizures were seen throughout the recording. Priyanka O Yadav   ECHO 08/03/2022 1. Left ventricular ejection fraction, by estimation, is 60 to 65%. The left ventricle has normal function. The left ventricle has no regional wall motion abnormalities. There is mild concentric left ventricular hypertrophy. Left ventricular diastolic parameters are consistent with Grade I diastolic dysfunction (impaired relaxation).  2. Right ventricular systolic function is normal. The right ventricular size is normal. Tricuspid regurgitation signal is inadequate for assessing PA pressure.  3. The mitral valve is normal in structure. No evidence of mitral valve regurgitation. No evidence of mitral stenosis.  4. The aortic valve is tricuspid. There is mild calcification of the aortic valve. Aortic valve regurgitation is not visualized. No aortic  stenosis is present.  5. The inferior vena cava is normal in size with greater than 50% respiratory variability, suggesting right atrial pressure of 3 mmHg.   A/P: - Based on suspicion for pulmonary edema bicarb rate was decreased but lungs are clear, oxygenation stable. Her insensible losses have to be significant, so will increase rate back to at least 75cc/hr. Note G1DD but preserved LVEF. - Bladder scan to r/o retention.  - Given increased frequency of SVT today, will start low dose IV metoprolol.  - Will schedule tylenol  - Dr. Lorenso Courier spoke with patient's family today. This is much appreciated. - Dr. Erlinda Hong met with patient and family this afternoon as well. I reviewed the MRI images personally with them.   Vance Gather, MD 08/06/2022 7:17 PM

## 2022-08-06 NOTE — Progress Notes (Signed)
ANTICOAGULATION CONSULT NOTE - Follow Up Consult  Pharmacy Consult for heparin Indication:  multiple embolic strokes with paradoxical embolism and DVT  Labs: Recent Labs    08/03/22 0410 08/03/22 1832 08/04/22 0140 08/05/22 0314 08/06/22 0133  HGB 11.7*  --  12.2 13.1 13.1  HCT 35.1*  --  35.0* 39.1 37.5  PLT 232  --  215 213 196  HEPARINUNFRC  --  <0.10*  --   --  0.12*  CREATININE 0.66  --  0.63 0.87  --     Assessment: 66yo female subtherapeutic on heparin with initial cautious dosing for paradoxical embolism and DVT in setting of multiple embolic strokes; no infusion issues or signs of bleeding per RN.  Goal of Therapy:  Heparin level 0.3-0.5 units/ml   Plan:  Will increase heparin infusion by 3 units/kg/hr to 1400 units/hr and check level in 6 hours.    Rogue Bussing 08/06/2022,2:47 AM

## 2022-08-06 NOTE — Progress Notes (Signed)
PT Cancellation Note  Patient Details Name: Sarah Walsh MRN: [id number] DOB: 09/13/1956   Cancelled Treatment:    Reason Eval/Treat Not Completed: Patient not medically ready.  Responding to pain only, will reattempt when medically ready.   Ramond Dial 08/06/2022, 10:25 AM  Mee Hives, PT PhD Acute Rehab Dept. Number: [place] and [place]

## 2022-08-06 NOTE — Progress Notes (Signed)
STROKE TEAM PROGRESS NOTE   SUBJECTIVE (INTERVAL HISTORY) Her son and husband are at the bedside.  Overall her condition is stable. Pt open eyes on stimulation but not tracking, not blinking to visual threat bilaterally. Spontaneous movement of left foot but only withdraw to pain on other limbs. WBC significantly elevated. Now on heparin IV.    OBJECTIVE Temp:  [98.2 F (36.8 C)-101.4 F (38.6 C)] 98.7 F (37.1 C) (10/14 2023) Pulse Rate:  [103-117] 103 (10/14 2023) Cardiac Rhythm: Sinus tachycardia (10/14 2000) Resp:  [0-30] 30 (10/14 2023) BP: (126-151)/(75-100) 126/81 (10/14 2023) SpO2:  [92 %-94 %] 94 % (10/14 2023)  Recent Labs  Lab 08/02/22 1506  GLUCAP 149*   Recent Labs  Lab 08/02/22 1351 08/02/22 1902 08/03/22 0410 08/04/22 0140 08/05/22 0314 08/06/22 0133  NA 134*  --  134* 133* 133* 136  K 3.4*  --  3.4* 3.2* 3.2* 3.2*  CL 99  --  100 101 104 107  CO2 23  --  23 20* 15* 18*  GLUCOSE 148*  --  136* 125* 157* 144*  BUN 11  --  7* '8 8 11  '$ CREATININE 0.75  --  0.66 0.63 0.87 0.72  CALCIUM 9.2  --  8.2* 8.1* 8.0* 7.9*  MG  --   --  1.9  --   --   --   PHOS  --  2.6  --   --   --   --    Recent Labs  Lab 08/02/22 1351  AST 22  ALT 14  ALKPHOS 118  BILITOT 0.9  PROT 7.5  ALBUMIN 3.2*   Recent Labs  Lab 08/02/22 1400 08/02/22 1902 08/03/22 0410 08/04/22 0140 08/05/22 0314 08/06/22 0133  WBC 54.5*  --  48.8* 48.2* 46.3* 55.1*  NEUTROABS 5.8  --   --   --   --   --   HGB 13.6  --  11.7* 12.2 13.1 13.1  HCT 39.7  --  35.1* 35.0* 39.1 37.5  MCV 94.3  --  95.6 92.1 93.3 93.5  PLT 296 278 232 215 213 196   No results for input(s): "CKTOTAL", "CKMB", "CKMBINDEX", "TROPONINI" in the last 168 hours. No results for input(s): "LABPROT", "INR" in the last 72 hours. No results for input(s): "COLORURINE", "LABSPEC", "PHURINE", "GLUCOSEU", "HGBUR", "BILIRUBINUR", "KETONESUR", "PROTEINUR", "UROBILINOGEN", "NITRITE", "LEUKOCYTESUR" in the last 72  hours.  Invalid input(s): "APPERANCEUR"     Component Value Date/Time   CHOL 127 08/03/2022 0905   TRIG 97 08/03/2022 0905   HDL 17 (L) 08/03/2022 0905   CHOLHDL 7.5 08/03/2022 0905   VLDL 19 08/03/2022 0905   LDLCALC 91 08/03/2022 0905   Lab Results  Component Value Date   HGBA1C 6.0 (H) 08/03/2022   No results found for: "LABOPIA", "COCAINSCRNUR", "LABBENZ", "AMPHETMU", "THCU", "LABBARB"  No results for input(s): "ETH" in the last 168 hours.  I have personally reviewed the radiological images below and agree with the radiology interpretations.  DG CHEST PORT 1 VIEW  Result Date: 08/06/2022 CLINICAL DATA:  Acute respiratory failure with hypoxia EXAM: PORTABLE CHEST 1 VIEW COMPARISON:  Chest x-ray 08/02/2022.  Chest CT 08/03/2022. FINDINGS: Bilateral pulmonary nodular densities are again noted and similar to the prior study. There is increased central pulmonary vascular congestion. The cardiomediastinal silhouette appears stable. There is no pneumothorax or acute fracture. IMPRESSION: 1. Increased central pulmonary vascular congestion. 2. Stable bilateral pulmonary nodules. Electronically Signed   By: Ronney Asters M.D.   On: 08/06/2022  17:33   EEG adult  Result Date: 08/06/2022 Lora Havens, MD     08/06/2022  8:43 AM [name] MRN: [id number] Epilepsy Attending: Lora Havens Referring Physician/Provider: Donnetta Simpers, MD Date: 08/06/2022 Duration: 23.12 mins Patient history: 66 year old female with alteration in mental status. EEG to evaluate for seizure Level of alertness:  lethargic AEDs during EEG study: None Technical aspects: This EEG study was done with scalp electrodes positioned according to the 10-20 International system of electrode placement. Electrical activity was reviewed with band pass filter of 1-'70Hz'$ , sensitivity of 7 uV/mm, display speed of 31m/sec with a '60Hz'$  notched filter applied as appropriate. EEG data were recorded continuously  and digitally stored.  Video monitoring was available and reviewed as appropriate. Description: EEG showed continuous generalized 3 to 6 Hz theta-delta slowing. Generalized periodic discharges with triphasic morphology at  1.5-'2Hz'$  were also noted, more prominent when awake/stimulated. Hyperventilation and photic stimulation were not performed.   ABNORMALITY - Periodic discharges with triphasic morphology, generalized ( GPDs) - Continuous slow, generalized IMPRESSION: This study showed generalized periodic discharges with triphasic morphology which can be on the ictal-interictal continuum. However, the morphology, frequency and reactivity to stimulation is more commonly indicative of toxic-metabolic causes. Additionally there is moderate to severe diffuse encephalopathy, nonspecific etiology. No seizures were seen throughout the recording. PLora Havens  MR BRAIN WO CONTRAST  Result Date: 08/05/2022 CLINICAL DATA:  History of stroke, mental status change EXAM: MRI HEAD WITHOUT CONTRAST TECHNIQUE: Multiplanar, multiecho pulse sequences of the brain and surrounding structures were obtained without intravenous contrast. COMPARISON:  CT head 1 day prior, brain MRI 08/02/2022 FINDINGS: Brain: Again seen are extensive acute infarcts throughout both cerebral and cerebellar hemispheres across bilateral ACA, MCA, and PCA distributions, overall increased in extent compared to the MRI from 3 days prior. There is associated FLAIR signal abnormality and a few punctate foci of SWI signal dropout suspicious for petechial hemorrhage but no organized hematoma or mass effect. The foci of intrinsic T1 hyperintensity in the occipital lobes may be due to petechial hemorrhage. There is no acute intracranial hemorrhage or extra-axial fluid collection Background parenchymal volume is normal. The ventricles are stable in size. There is no mass lesion. There is no mass effect or midline shift. Vascular: Normal flow voids. Skull and  upper cervical spine: Normal marrow signal. Sinuses/Orbits: The paranasal sinuses are clear. The globes and orbits are unremarkable. Other: None. IMPRESSION: Extensive acute infarcts throughout both cerebral and cerebellar hemispheres across multiple vascular distributions again favored embolic with differential including watershed ischemia, increased in extent since the MRI from 3 days prior. Suspect some petechial hemorrhage but no hematoma or mass effect. Electronically Signed   By: PValetta MoleM.D.   On: 08/05/2022 19:45   ECHO TEE  Result Date: 08/05/2022    TRANSESOPHOGEAL ECHO REPORT   Patient Name:   Sarah BRUMLEYDate of Exam: 08/05/2022 Medical Rec #:  [id number]    Height:       63.0 in Accession #:    [id number]   Weight:       160.0 lb Date of Birth:  [birth year]    BSA:          1.759 m Patient Age:    62years      BP:           125/78 mmHg Patient Gender: F             HR:  99 bpm. Exam Location:  Inpatient Procedure: Transesophageal Echo, 3D Echo, Color Doppler, Cardiac Doppler and            Saline Contrast Bubble Study Indications:     Stroke i63.9  History:         Patient has prior history of Echocardiogram examinations, most                  recent 08/03/2022. Risk Factors:Hypertension, Diabetes and                  Dyslipidemia.  Sonographer:     Raquel Sarna Senior RDCS Referring Phys:  [id number] Margie Billet Diagnosing Phys: Oswaldo Milian MD PROCEDURE: After discussion of the risks and benefits of a TEE, an informed consent was obtained from the patient. The transesophogeal probe was passed without difficulty through the esophogus of the patient. Sedation performed by different physician. The patient was monitored while under deep sedation. Anesthestetic sedation was provided intravenously by Anesthesiology: '70mg'$  of Propofol. The patient developed no complications during the procedure. IMPRESSIONS  1. Left ventricular ejection fraction, by estimation, is 55 to 60%. The  left ventricle has normal function.  2. Right ventricular systolic function is normal. The right ventricular size is normal. There is moderately elevated pulmonary artery systolic pressure.  3. No left atrial/left atrial appendage thrombus was detected.  4. The mitral valve is normal in structure. Mild mitral valve regurgitation.  5. The aortic valve is tricuspid. Aortic valve regurgitation is not visualized.  6. Evidence of atrial level shunting detected by color flow Doppler. Agitated saline contrast bubble study was positive with shunting observed within 3-6 cardiac cycles suggestive of interatrial shunt. Few bubbles seen in left atrium after patient coughed, suggesting small PFO. Small PFO confirmed visually with left to right shunting.  7. No vegetation seen FINDINGS  Left Ventricle: Left ventricular ejection fraction, by estimation, is 55 to 60%. The left ventricle has normal function. The left ventricular internal cavity size was normal in size. Right Ventricle: The right ventricular size is normal. No increase in right ventricular wall thickness. Right ventricular systolic function is normal. There is moderately elevated pulmonary artery systolic pressure. The tricuspid regurgitant velocity is 3.35 m/s, and with an assumed right atrial pressure of 3 mmHg, the estimated right ventricular systolic pressure is 05.3 mmHg. Left Atrium: Left atrial size was normal in size. No left atrial/left atrial appendage thrombus was detected. Right Atrium: Right atrial size was normal in size. Pericardium: Trivial pericardial effusion is present. Mitral Valve: The mitral valve is normal in structure. Mild mitral valve regurgitation. Tricuspid Valve: The tricuspid valve is normal in structure. Tricuspid valve regurgitation is mild. Aortic Valve: The aortic valve is tricuspid. Aortic valve regurgitation is not visualized. Aortic valve mean gradient measures 6.0 mmHg. Aortic valve peak gradient measures 10.2 mmHg. Pulmonic  Valve: The pulmonic valve was grossly normal. Pulmonic valve regurgitation is not visualized. Aorta: The aortic root is normal in size and structure. IAS/Shunts: Evidence of atrial level shunting detected by color flow Doppler. Agitated saline contrast was given intravenously to evaluate for intracardiac shunting. Agitated saline contrast bubble study was positive with shunting observed within 3-6 cardiac cycles suggestive of interatrial shunt.  AORTIC VALVE AV Vmax:           160.00 cm/s AV Vmean:          112.000 cm/s AV VTI:            0.244 m AV Peak Grad:  10.2 mmHg AV Mean Grad:      6.0 mmHg LVOT Vmax:         115.00 cm/s LVOT Vmean:        83.500 cm/s LVOT VTI:          0.210 m LVOT/AV VTI ratio: 0.86 TRICUSPID VALVE TR Peak grad:   44.9 mmHg TR Vmax:        335.00 cm/s  SHUNTS Systemic VTI: 0.21 m Oswaldo Milian MD Electronically signed by Oswaldo Milian MD Signature Date/Time: 08/05/2022/4:04:01 PM    Final    CT VENOGRAM HEAD  Result Date: 08/04/2022 CLINICAL DATA:  Dural venous sinus thrombosis suspected. EXAM: CT VENOGRAM HEAD TECHNIQUE: Venographic phase images of the brain were obtained following the administration of intravenous contrast. Multiplanar reformats and maximum intensity projections were generated. RADIATION DOSE REDUCTION: This exam was performed according to the departmental dose-optimization program which includes automated exposure control, adjustment of the mA and/or kV according to patient size and/or use of iterative reconstruction technique. CONTRAST:  66m OMNIPAQUE IOHEXOL 350 MG/ML SOLN COMPARISON:  Head and neck CTA 08/04/2022.  Head MRI 08/02/2022. FINDINGS: The recent MRI demonstrated numerous acute infarcts involving both cerebral hemispheres and cerebellum, the majority of which are occult by CT. No acute intracranial hemorrhage, midline shift, or extra-axial fluid collection is identified. The ventricles are normal in size. No fracture or suspicious  osseous lesion is identified. There is minimal mucosal thickening in the left sphenoid sinus. The mastoid air cells are clear. The orbits are unremarkable. CT venogram images are motion degraded, however the superior sagittal sinus, internal cerebral veins, vein of Galen, straight sinus, and sigmoid sinuses are enhancing without evidence of thrombosis. The transverse sinuses are small but grossly patent. A small filling defect in the posterior aspect of the superior sagittal sinus is compatible with an arachnoid granulation. IMPRESSION: No evidence of dural venous sinus thrombosis. Electronically Signed   By: ALogan BoresM.D.   On: 08/04/2022 16:45   CT ANGIO HEAD NECK W WO CM  Addendum Date: 08/04/2022   ADDENDUM REPORT: 08/04/2022 11:52 ADDENDUM: Findings were discussed with Dr. VEliseo Squireson 08/04/22 at 11:50 AM. Electronically Signed   By: HMarin RobertsM.D.   On: 08/04/2022 11:52   Result Date: 08/04/2022 CLINICAL DATA:  Stroke follow-up EXAM: CT ANGIOGRAPHY HEAD AND NECK TECHNIQUE: Multidetector CT imaging of the head and neck was performed using the standard protocol during bolus administration of intravenous contrast. Multiplanar CT image reconstructions and MIPs were obtained to evaluate the vascular anatomy. Carotid stenosis measurements (when applicable) are obtained utilizing NASCET criteria, using the distal internal carotid diameter as the denominator. RADIATION DOSE REDUCTION: This exam was performed according to the departmental dose-optimization program which includes automated exposure control, adjustment of the mA and/or kV according to patient size and/or use of iterative reconstruction technique. CONTRAST:  661mOMNIPAQUE IOHEXOL 350 MG/ML SOLN COMPARISON:  MRI Head 08/02/22 FINDINGS: CT HEAD FINDINGS Brain: No evidence of hemorrhage, hydrocephalus, extra-axial collection or mass lesion/mass effect. Extensive infarcts seen on prior MRI are difficult to definitively visualize on this exam, but  some of them are seen in the right centrum semiovale, bilateral occipital lobes, and in the right cerebellum. Vascular: See below Skull: Normal. Negative for fracture or focal lesion. Sinuses/Orbits: No acute finding. Other: None. Review of the MIP images confirms the above findings CTA NECK FINDINGS Aortic arch: Standard branching. Imaged portion shows no evidence of aneurysm or dissection. No significant stenosis of the major arch vessel  origins. Right carotid system: No evidence of dissection, stenosis (50% or greater), or occlusion. Left carotid system: No evidence of dissection, stenosis (50% or greater), or occlusion. Vertebral arteries: Codominant. No evidence of dissection, stenosis (50% or greater), or occlusion. Skeleton: Negative. Other neck: Asymmetrically enlarged right level 5 lymph node measuring up to 6 mm (series 9, image 122). Additional enlarged right level 5 lymph node is present more superiorly measuring 9 mm (series 9, image 106). Enlarged 7 mm left level 5 lymph node is also present (series 9, image 115). 9 mm right level 2A lymph node (series 9, image 94). There is soft tissue stranding along the posterior right suboccipital neck (series 9, image 87), nonspecific. Upper chest: Small bilateral pleural effusions. There is a 1.2 cm right upper lobe pulmonary nodule, better assessed on prior CT chest dated 08/04/2019. There also multiple enlarged mediastinal lymph nodes and left axillary lymph node. Review of the MIP images confirms the above findings CTA HEAD FINDINGS Anterior circulation: No significant stenosis, proximal occlusion, aneurysm, or vascular malformation. Posterior circulation: No significant stenosis, proximal occlusion, aneurysm, or vascular malformation. Venous sinuses: There is a congenitally small left transverse and sigmoid sinus. There is decreased opacification of the right-sided transverse and sigmoid sinus (series 11, image 122). This area of decreased opacification  extends proximally to involve the superior sagittal sinus hazy month Anatomic variants: None Review of the MIP images confirms the above findings IMPRESSION: 1. Asymmetrically decreased opacification of the right-sided transverse and sigmoid sinus is worrisome for dural venous sinus thrombosis. Recommend further evaluation with a CT venogram. 2. No intracranial LVO or significant arterial stenosis in the neck. 3. No acute intracranial process. 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 4. Multiple enlarged lymph nodes in the neck, mediastinum, and left axilla, which are compatible with clinically suspected lymphoma. 5. Partially imaged pleural effusions and right sided pulmonary nodule, better seen on prior CTA chest. Electronically Signed: By: Marin Roberts M.D. On: 08/04/2022 11:17   VAS [place] LOWER EXTREMITY VENOUS (DVT)  Result Date: 08/03/2022  Lower Venous DVT Study [name]  Date of Exam:   08/03/2022 Medical Rec #: [id number]      Accession #:    [id number] Date of Birth: [birth year]      Patient Gender: F Patient Age:   24 years Exam Location:  [facility] Procedure:      VAS [place] LOWER EXTREMITY VENOUS (DVT) Referring Phys: TIMOTHY OPYD --------------------------------------------------------------------------------  Indications: Elevated d-dimer, lymphoma.  Comparison       06-15-2021 Prior left lower extremity venous was negative for Study:           DVT. Performing Technologist: Darlin Coco RDMS, RVT  Examination Guidelines: A complete evaluation includes B-mode imaging, spectral Doppler, color Doppler, and power Doppler as needed of all accessible portions of each vessel. Bilateral testing is considered an integral part of a complete examination. Limited examinations for reoccurring indications may be performed as  noted. The reflux portion of the exam is performed with the patient in reverse Trendelenburg.  +---------+---------------+---------+-----------+----------+--------------+ RIGHT    CompressibilityPhasicitySpontaneityPropertiesThrombus Aging +---------+---------------+---------+-----------+----------+--------------+ CFV      Full           Yes      Yes                                 +---------+---------------+---------+-----------+----------+--------------+  SFJ      Full                                                        +---------+---------------+---------+-----------+----------+--------------+ FV Prox  Full                                                        +---------+---------------+---------+-----------+----------+--------------+ FV Mid   Full                                                        +---------+---------------+---------+-----------+----------+--------------+ FV DistalFull                                                        +---------+---------------+---------+-----------+----------+--------------+ PFV      Full                                                        +---------+---------------+---------+-----------+----------+--------------+ POP      Full           Yes      Yes                                 +---------+---------------+---------+-----------+----------+--------------+ PTV      Full                                                        +---------+---------------+---------+-----------+----------+--------------+ PERO     Full                                                        +---------+---------------+---------+-----------+----------+--------------+ Gastroc  Full                                                        +---------+---------------+---------+-----------+----------+--------------+   +---------+---------------+---------+-----------+----------+--------------+ LEFT      CompressibilityPhasicitySpontaneityPropertiesThrombus Aging +---------+---------------+---------+-----------+----------+--------------+ CFV      Full           Yes      Yes                                 +---------+---------------+---------+-----------+----------+--------------+  SFJ      Full                                                        +---------+---------------+---------+-----------+----------+--------------+ FV Prox  Full                                                        +---------+---------------+---------+-----------+----------+--------------+ FV Mid   Full                                                        +---------+---------------+---------+-----------+----------+--------------+ FV DistalFull                                                        +---------+---------------+---------+-----------+----------+--------------+ PFV      Full                                                        +---------+---------------+---------+-----------+----------+--------------+ POP      Full           Yes      Yes                                 +---------+---------------+---------+-----------+----------+--------------+ PTV      Full                                                        +---------+---------------+---------+-----------+----------+--------------+ PERO     Partial        No       No                   Acute          +---------+---------------+---------+-----------+----------+--------------+ Gastroc  Full                                                        +---------+---------------+---------+-----------+----------+--------------+     Summary: RIGHT: - There is no evidence of deep vein thrombosis in the lower extremity.  - No cystic structure found in the popliteal fossa.  - Ultrasound characteristics of enlarged lymph nodes are noted in the groin.  LEFT: - Findings consistent with acute deep vein thrombosis  involving the left peroneal veins.  - No cystic structure found in the  popliteal fossa.  - Ultrasound characteristics of enlarged lymph nodes noted in the groin.  *See table(s) above for measurements and observations. Electronically signed by Harold Barban MD on 08/03/2022 at 9:30:05 PM.    Final    ECHOCARDIOGRAM COMPLETE  Result Date: 08/03/2022    ECHOCARDIOGRAM REPORT   Patient Name:   Sarah Walsh Date of Exam: 08/03/2022 Medical Rec #:  [id number]     Height:       63.0 in Accession #:    [id number]    Weight:       160.0 lb Date of Birth:  [birth year]     BSA:          1.759 m Patient Age:    47 years      BP:           152/94 mmHg Patient Gender: F             HR:           110 bpm. Exam Location:  Inpatient Procedure: 2D Echo Indications:    stroke  History:        Patient has prior history of Echocardiogram examinations and                 Patient has no prior history of Echocardiogram examinations.                 Sepsis; Risk Factors:Hypertension.  Sonographer:    Johny Chess RDCS Referring Phys: [id number] RIPUDEEP K RAI  Sonographer Comments: Image acquisition challenging due to uncooperative patient. IMPRESSIONS  1. Left ventricular ejection fraction, by estimation, is 60 to 65%. The left ventricle has normal function. The left ventricle has no regional wall motion abnormalities. There is mild concentric left ventricular hypertrophy. Left ventricular diastolic parameters are consistent with Grade I diastolic dysfunction (impaired relaxation).  2. Right ventricular systolic function is normal. The right ventricular size is normal. Tricuspid regurgitation signal is inadequate for assessing PA pressure.  3. The mitral valve is normal in structure. No evidence of mitral valve regurgitation. No evidence of mitral stenosis.  4. The aortic valve is tricuspid. There is mild calcification of the aortic valve. Aortic valve regurgitation is not visualized. No aortic stenosis is present.  5. The inferior vena  cava is normal in size with greater than 50% respiratory variability, suggesting right atrial pressure of 3 mmHg. FINDINGS  Left Ventricle: Left ventricular ejection fraction, by estimation, is 60 to 65%. The left ventricle has normal function. The left ventricle has no regional wall motion abnormalities. The left ventricular internal cavity size was normal in size. There is  mild concentric left ventricular hypertrophy. Left ventricular diastolic parameters are consistent with Grade I diastolic dysfunction (impaired relaxation). Right Ventricle: The right ventricular size is normal. No increase in right ventricular wall thickness. Right ventricular systolic function is normal. Tricuspid regurgitation signal is inadequate for assessing PA pressure. Left Atrium: Left atrial size was normal in size. Right Atrium: Right atrial size was normal in size. Pericardium: There is no evidence of pericardial effusion. Mitral Valve: The mitral valve is normal in structure. There is mild calcification of the mitral valve leaflet(s). Mild mitral annular calcification. No evidence of mitral valve regurgitation. No evidence of mitral valve stenosis. Tricuspid Valve: The tricuspid valve is normal in structure. Tricuspid valve regurgitation is not demonstrated. Aortic Valve: The aortic valve is tricuspid. There is mild calcification of the aortic valve. Aortic valve regurgitation is not visualized. No aortic stenosis  is present. Pulmonic Valve: The pulmonic valve was normal in structure. Pulmonic valve regurgitation is not visualized. Aorta: The aortic root is normal in size and structure. Venous: The inferior vena cava is normal in size with greater than 50% respiratory variability, suggesting right atrial pressure of 3 mmHg. IAS/Shunts: No atrial level shunt detected by color flow Doppler.  LEFT VENTRICLE PLAX 2D LVIDd:         4.20 cm LVIDs:         2.40 cm LV PW:         0.90 cm LV IVS:        1.20 cm LVOT diam:     1.60 cm LV SV:          48 LV SV Index:   27 LVOT Area:     2.01 cm  RIGHT VENTRICLE             IVC RV S prime:     20.30 cm/s  IVC diam: 1.30 cm TAPSE (M-mode): 1.6 cm LEFT ATRIUM             Index        RIGHT ATRIUM          Index LA diam:        3.40 cm 1.93 cm/m   RA Area:     9.64 cm LA Vol (A2C):   26.3 ml 14.95 ml/m  RA Volume:   19.10 ml 10.86 ml/m LA Vol (A4C):   27.5 ml 15.64 ml/m LA Biplane Vol: 27.9 ml 15.86 ml/m  AORTIC VALVE LVOT Vmax:   152.00 cm/s LVOT Vmean:  105.000 cm/s LVOT VTI:    0.238 m  AORTA Ao Root diam: 3.00 cm MV E velocity: 79.60 cm/s MV A velocity: 141.00 cm/s  SHUNTS MV E/A ratio:  0.56         Systemic VTI:  0.24 m                             Systemic Diam: 1.60 cm Dalton McleanMD Electronically signed by Franki Monte Signature Date/Time: 08/03/2022/2:41:22 PM    Final    CT Angio Chest Pulmonary Embolism (PE) W or WO Contrast  Result Date: 08/03/2022 CLINICAL DATA:  Pulmonary embolism suspected, positive D-dimer. Undergoing malignancy workup with recent biopsy suggesting lymphoma. EXAM: CT ANGIOGRAPHY CHEST WITH CONTRAST TECHNIQUE: Multidetector CT imaging of the chest was performed using the standard protocol during bolus administration of intravenous contrast. Multiplanar CT image reconstructions and MIPs were obtained to evaluate the vascular anatomy. RADIATION DOSE REDUCTION: This exam was performed according to the departmental dose-optimization program which includes automated exposure control, adjustment of the mA and/or kV according to patient size and/or use of iterative reconstruction technique. CONTRAST:  30m OMNIPAQUE IOHEXOL 350 MG/ML SOLN COMPARISON:  None Available. FINDINGS: Cardiovascular: The heart is borderline enlarged. There is no pericardial effusion. Scattered coronary artery calcifications are noted. There is atherosclerotic calcification of the aorta without evidence of aneurysm. Pulmonary trunk is normal in caliber. No pulmonary artery filling defect is  seen. Examination is limited due to respiratory motion artifact and pulmonary nodules. Mediastinum/Nodes: Multiple enlarged lymph nodes are present in the mediastinum and hilar regions bilaterally. No axillary lymphadenopathy. Thyroid gland, trachea, and esophagus are within normal limits. Lungs/Pleura: Hazy regional ground-glass opacities are present in the lungs bilaterally. There are multiple scattered pulmonary nodules bilaterally, the largest in the right lower lobe measuring 3.7 cm, axial image  110 and in the left lower lobe measuring 4.1 cm, axial image 94. No effusion or pneumothorax. Upper Abdomen: Enlarged lymph nodes are present in the gastrohepatic ligament measuring up to 1.1 cm. No acute abnormality. Musculoskeletal: No acute or suspicious osseous abnormality. Review of the MIP images confirms the above findings. IMPRESSION: 1. No evidence of pulmonary embolism. 2. Multiple enlarged mediastinal and hilar lymph nodes and pulmonary nodules bilaterally measuring up to 4.1 cm, concerning for neoplastic process or lymphoma. Correlation with recent biopsy results is recommended. 3. Mild hazy geographic ground-glass opacities in the lungs, possible edema or air trapping. 4. Coronary artery calcifications. 5. Aortic atherosclerosis. Electronically Signed   By: Brett Fairy M.D.   On: 08/03/2022 01:10   MR BRAIN WO CONTRAST  Result Date: 08/02/2022 CLINICAL DATA:  Altered mental status EXAM: MRI HEAD WITHOUT CONTRAST TECHNIQUE: Multiplanar, multiecho pulse sequences of the brain and surrounding structures were obtained without intravenous contrast. COMPARISON:  04/15/2013 FINDINGS: Brain: Numerous, diffuse bilateral foci of abnormal diffusion restriction in a pattern that is most suggestive of embolic shower but could also indicate watershed ischemia. No acute hemorrhage. There is multifocal hyperintense T2-weighted signal within the white matter. Generalized volume loss. The midline structures are  normal. Vascular: Major flow voids are preserved. Skull and upper cervical spine: Normal calvarium and skull base. Visualized upper cervical spine and soft tissues are normal. Sinuses/Orbits:No paranasal sinus fluid levels or advanced mucosal thickening. No mastoid or middle ear effusion. Normal orbits. IMPRESSION: 1. Numerous, diffuse bilateral foci of abnormal diffusion restriction in a pattern that is most suggestive of embolic shower but could also indicate watershed ischemia. 2. No acute hemorrhage or mass effect. Electronically Signed   By: Ulyses Jarred M.D.   On: 08/02/2022 23:57   CT Head Wo Contrast  Result Date: 08/02/2022 CLINICAL DATA:  Altered mental status, recent diagnosis of lymphoma EXAM: CT HEAD WITHOUT CONTRAST TECHNIQUE: Contiguous axial images were obtained from the base of the skull through the vertex without intravenous contrast. RADIATION DOSE REDUCTION: This exam was performed according to the departmental dose-optimization program which includes automated exposure control, adjustment of the mA and/or kV according to patient size and/or use of iterative reconstruction technique. COMPARISON:  MR brain done on 04/15/2013 FINDINGS: Brain: No acute intracranial findings are seen in noncontrast CT brain. There are no signs of bleeding within the cranium. Ventricles are not dilated. Cortical sulci are prominent. There is no focal edema or mass effect. Vascular: Unremarkable Skull: Unremarkable. Sinuses/Orbits: Unremarkable. Other: None. IMPRESSION: No acute intracranial findings are seen in noncontrast CT brain. Atrophy. Electronically Signed   By: Elmer Picker M.D.   On: 08/02/2022 16:35   DG Chest 2 View  Result Date: 08/02/2022 CLINICAL DATA:  Cough, altered mental status x3 days EXAM: CHEST - 2 VIEW COMPARISON:  04/21/2021 FINDINGS: Cardiac size is within normal limits. Thoracic aorta is tortuous. There are nodular densities of varying sizes scattered throughout both lungs  largest overlying the inferior right hilum measuring 2.8 cm. Findings suggest possible metastatic disease. Less likely possibility would be nodular appearing multifocal pneumonia. There is no pleural effusion or pneumothorax. Degenerative changes are noted in both shoulders. IMPRESSION: There are multiple nodular densities of varying sizes in both lungs suggesting pulmonary metastatic disease. Follow-up CT, PET-CT and biopsy as warranted should be considered. Please correlate with clinical history for any known primary malignancy. Electronically Signed   By: Elmer Picker M.D.   On: 08/02/2022 15:37     PHYSICAL EXAM  Temp:  [98.2 F (36.8 C)-101.4 F (38.6 C)] 98.7 F (37.1 C) (10/14 2023) Pulse Rate:  [103-117] 103 (10/14 2023) Resp:  [0-30] 30 (10/14 2023) BP: (126-151)/(75-100) 126/81 (10/14 2023) SpO2:  [92 %-94 %] 94 % (10/14 2023)  General - Well nourished, well developed, in no apparent distress.  Ophthalmologic - fundi not visualized due to noncooperation.  Cardiovascular - Regular rhythm and rate.  Neuro - initially sleeping, but eyes open with repetitive stimulation. With eyes open, eyes right gaze preference not cross midline. Dull's eyes present bilaterally, not tracking bilaterally, not blinking to visual threat bilaterally. No facial droop. Tongue protrusion not cooperative. Withdraw to pain in all limbs, LEs > UEs. Sensation, coordination and gait not tested.   ASSESSMENT/PLAN Ms. DENE LANDSBERG is a 66 y.o. female with history of recent diagnosed lymphoma, recent MRSA infection admitted for altered mental status, lethargy and not talking. No tPA given due to outside window.    Stroke:  bilateral embolic shower, etiology not quite clear, however could be due to hypercoagulable state from malignancy, distal DVT in the setting of small PFO and viscosity of blood from lymphoma with significant leukocytosis CT no acute abnormality MRI 62/22 embolic shower bilaterally CTA  chest no PE CT head and neck and CTV unremarkable MRI 10/13 extension of embolic shower bilaterally 2D Echo EF 60 to 65% LE venous Doppler left peroneal vein DVT TEE showed small PFO LDL 91 HgbA1c 6.0 Ammonia level normal Heparin IV for VTE prophylaxis No antithrombotic prior to admission, now on heparin IV.  Ongoing aggressive stroke risk factor management Therapy recommendations: Pending Disposition: Pending  Hypertension Stable Long term BP goal normotensive  Hyperlipidemia Home meds: Zetia LDL 91, goal < 70 Consider statin and Zetia after p.o. access Continue statin at discharge  Fever Tmax 101.4 Tachycardia Blood culture negative, urine culture showed pansensitive E. Coli On Rocephin, Flagyl and vancomycin.  Dysphagia Did not pass swallow, n.p.o. now Speech on board  PFO LE DVT Small PFO on TEE LE venous Doppler left peroneal vein DVT On heparin IV  Lymphoma Recent diagnosis at [facility] Dr. Lorenso Courier on board WBC 48.8->48.2->46.3->55.1 Viscosity of the blood can be part of the reason of stroke  Other Stroke Risk Factors Advanced age  Other Active Problems Recent MRSA infection, on vancomycin  Hospital day # 3  I discussed with Dr. Bonner Puna. I spent extensive time with the patient son and husband, more than 50% of which was spent in counseling and coordination of care, reviewing test results, images and medication, and discussing the diagnosis, treatment plan and potential prognosis. This patient's care requiresreview of multiple databases, neurological assessment, discussion with family, other specialists and medical decision making of high complexity.    Rosalin Hawking, MD PhD Stroke Neurology 08/06/2022 10:04 PM    To contact Stroke Continuity provider, please refer to [email or web address] After hours, contact General Neurology

## 2022-08-07 ENCOUNTER — Other Ambulatory Visit (HOSPITAL_COMMUNITY): Payer: Medicare HMO

## 2022-08-07 DIAGNOSIS — I82452 Acute embolism and thrombosis of left peroneal vein: Secondary | ICD-10-CM | POA: Diagnosis not present

## 2022-08-07 DIAGNOSIS — A4102 Sepsis due to Methicillin resistant Staphylococcus aureus: Secondary | ICD-10-CM | POA: Diagnosis not present

## 2022-08-07 DIAGNOSIS — I82451 Acute embolism and thrombosis of right peroneal vein: Secondary | ICD-10-CM | POA: Diagnosis not present

## 2022-08-07 DIAGNOSIS — N390 Urinary tract infection, site not specified: Secondary | ICD-10-CM

## 2022-08-07 DIAGNOSIS — G9341 Metabolic encephalopathy: Secondary | ICD-10-CM | POA: Diagnosis not present

## 2022-08-07 DIAGNOSIS — R4182 Altered mental status, unspecified: Secondary | ICD-10-CM | POA: Diagnosis not present

## 2022-08-07 DIAGNOSIS — I639 Cerebral infarction, unspecified: Secondary | ICD-10-CM | POA: Diagnosis not present

## 2022-08-07 LAB — CULTURE, BLOOD (ROUTINE X 2)
Culture: NO GROWTH
Culture: NO GROWTH
Special Requests: ADEQUATE

## 2022-08-07 LAB — CBC WITH DIFFERENTIAL/PLATELET
Abs Immature Granulocytes: 0 10*3/uL (ref 0.00–0.07)
Basophils Absolute: 0 10*3/uL (ref 0.0–0.1)
Basophils Relative: 0 %
Eosinophils Absolute: 47.9 10*3/uL — ABNORMAL HIGH (ref 0.0–0.5)
Eosinophils Relative: 75 %
HCT: 36.2 % (ref 36.0–46.0)
Hemoglobin: 12.9 g/dL (ref 12.0–15.0)
Lymphocytes Relative: 3 %
Lymphs Abs: 1.9 10*3/uL (ref 0.7–4.0)
MCH: 33.2 pg (ref 26.0–34.0)
MCHC: 35.6 g/dL (ref 30.0–36.0)
MCV: 93.3 fL (ref 80.0–100.0)
Monocytes Absolute: 0.6 10*3/uL (ref 0.1–1.0)
Monocytes Relative: 1 %
Neutro Abs: 13.4 10*3/uL — ABNORMAL HIGH (ref 1.7–7.7)
Neutrophils Relative %: 21 %
Platelets: 208 10*3/uL (ref 150–400)
RBC: 3.88 MIL/uL (ref 3.87–5.11)
RDW: 16.7 % — ABNORMAL HIGH (ref 11.5–15.5)
WBC: 63.8 10*3/uL (ref 4.0–10.5)
nRBC: 0 % (ref 0.0–0.2)
nRBC: 0 /100 WBC

## 2022-08-07 LAB — CBC
HCT: 30.8 % — ABNORMAL LOW (ref 36.0–46.0)
Hemoglobin: 10.9 g/dL — ABNORMAL LOW (ref 12.0–15.0)
MCH: 33 pg (ref 26.0–34.0)
MCHC: 35.4 g/dL (ref 30.0–36.0)
MCV: 93.3 fL (ref 80.0–100.0)
Platelets: 181 10*3/uL (ref 150–400)
RBC: 3.3 MIL/uL — ABNORMAL LOW (ref 3.87–5.11)
RDW: 16.6 % — ABNORMAL HIGH (ref 11.5–15.5)
WBC: 54.5 10*3/uL (ref 4.0–10.5)
nRBC: 0 % (ref 0.0–0.2)

## 2022-08-07 LAB — BASIC METABOLIC PANEL
Anion gap: 9 (ref 5–15)
BUN: 14 mg/dL (ref 8–23)
CO2: 23 mmol/L (ref 22–32)
Calcium: 7.8 mg/dL — ABNORMAL LOW (ref 8.9–10.3)
Chloride: 104 mmol/L (ref 98–111)
Creatinine, Ser: 0.68 mg/dL (ref 0.44–1.00)
GFR, Estimated: >60 mL/min (ref >60)
Glucose, Bld: 153 mg/dL — ABNORMAL HIGH (ref 70–99)
Potassium: 3.2 mmol/L — ABNORMAL LOW (ref 3.5–5.1)
Sodium: 136 mmol/L (ref 135–145)

## 2022-08-07 LAB — HEPARIN LEVEL (UNFRACTIONATED): Heparin Unfractionated: 0.35 IU/mL (ref 0.30–0.70)

## 2022-08-07 MED ORDER — LIP MEDEX EX OINT
TOPICAL_OINTMENT | CUTANEOUS | Status: DC | PRN
  Filled 2022-08-07: qty 7

## 2022-08-07 MED ORDER — POTASSIUM CHLORIDE 2 MEQ/ML IV SOLN
INTRAVENOUS | Status: DC
  Filled 2022-08-07 (×3): qty 1000

## 2022-08-07 NOTE — Evaluation (Signed)
Physical Therapy Evaluation Patient Details Name: Sarah Walsh MRN: [id number] DOB: Mar 08, 1956 Today's Date: 08/07/2022  History of Present Illness  66 y.o. female presents to [facility] hospital on 08/02/2022 with lethargy, confusion, anorexia, gait instability. Pt recently diagnosed with T cell lymphoma at [facility]. MRI demonstrates numerous diffuse bilateral foci of abnormal diffusion restriction, suggestive of embolic shower. Also with new L lower extremity DVT. Repeat MRI due to worsening level of consciousness showed extension of embolic shower strokes, involving bil cerebral and cerebellar hemispheres. PMH includes HTN, HLP, DMII, T cell lymphoma   Clinical Impression  Pt presents with condition above and deficits mentioned below, see PT Problem List. PTA, she was living with her ex-husband in a 1-level apartment with a level entry, functioning independently without DME. At this time, pt is very lethargic, but did open her eyes intermittently to her daughter saying "mom". She also opened and kept her eyes open once dependently sat up EOB. She tends to have a response more consistently with her family talking to her. However, pt did not follow any commands and maintained a forward gaze with no noted attempts to make eye contact or track anyone. She would spontaneously move her bil lower extremities and withdraw from noxious stimuli, but did not display any movement or withdrawal with her bil upper extremities. Pt is requiring TA for all functional mobility and to sit statically at this time. If pt begins to make progress and is able to participate more, she may benefit from AIR. However, if this does not occur, she would likely be more appropriate for a SNF. Will continue to follow acutely.       Recommendations for follow up therapy are one component of a multi-disciplinary discharge planning process, led by the attending physician.  Recommendations may be updated based on patient status, additional  functional criteria and insurance authorization.  Follow Up Recommendations Acute inpatient rehab (3hours/day) (if she progresses)      Assistance Recommended at Discharge Frequent or constant Supervision/Assistance  Patient can return home with the following  Two people to help with walking and/or transfers;Two people to help with bathing/dressing/bathroom;Assistance with cooking/housework;Assistance with feeding;Direct supervision/assist for medications management;Direct supervision/assist for financial management;Assist for transportation;Help with stairs or ramp for entrance    Equipment Recommendations Other (comment) (defer to post acute care)  Recommendations for Other Services  OT consult;Rehab consult    Functional Status Assessment Patient has had a recent decline in their functional status and demonstrates the ability to make significant improvements in function in a reasonable and predictable amount of time.     Precautions / Restrictions Precautions Precautions: Fall Restrictions Weight Bearing Restrictions: No      Mobility  Bed Mobility Overal bed mobility: Needs Assistance Bed Mobility: Supine to Sit, Sit to Supine, Rolling Rolling: Total assist   Supine to sit: Total assist, HOB elevated Sit to supine: Total assist, HOB elevated   General bed mobility comments: No activation noted by pt, TA for all bed mobility    Transfers                   General transfer comment: deferred due to low level of arousal and no command following    Ambulation/Gait               General Gait Details: deferred  Stairs            Wheelchair Mobility    Modified Rankin (Stroke Patients Only) Modified Rankin (Stroke Patients Only)  Pre-Morbid Rankin Score: No symptoms Modified Rankin: Severe disability     Balance Overall balance assessment: Needs assistance Sitting-balance support: Bilateral upper extremity supported, Feet supported Sitting  balance-Leahy Scale: Zero Sitting balance - Comments: TA to sit statically EOB ~10 min, dependent on therapist to support head/neck, no reactional strategies noted       Standing balance comment: deferred                             Pertinent Vitals/Pain Pain Assessment Pain Assessment: Faces Faces Pain Scale: No hurt Pain Intervention(s): Monitored during session    Home Living Family/patient expects to be discharged to:: Private residence Living Arrangements: Spouse/significant other (ex-husband) Available Help at Discharge: Family;Available 24 hours/day Type of Home: Apartment Home Access: Level entry       Home Layout: One level Home Equipment: BSC/3in1      Prior Function Prior Level of Function : Independent/Modified Independent;Driving             Mobility Comments: No AD       Hand Dominance        Extremity/Trunk Assessment   Upper Extremity Assessment Upper Extremity Assessment: Defer to OT evaluation (no UE movement or withdrawal to noxious/painful stimuli noted)    Lower Extremity Assessment Lower Extremity Assessment: Difficult to assess due to impaired cognition (moves bil lower extremity spontaneously and withdraws to noxious stimuli at plantar aspects of feet)    Cervical / Trunk Assessment Cervical / Trunk Assessment: Kyphotic  Communication   Communication: Other (comment) (unsure of receptive/expressive capabilites due to low level of arousal)  Cognition Arousal/Alertness: Lethargic Behavior During Therapy: Flat affect Overall Cognitive Status: Difficult to assess                                 General Comments: Pt lethargic with eyes closed with noxious stimuli when supine start of session. Eyes opened intermittently to pt's daughter stating "mom" and then remained opened once dependently sat EOB. Pt not following any commands or displaying any reactional strategies. Spontaneous bil lower extremity movement  and withdrawal to noxious stimuli only, except potential "smirks" when family tried to talk with her. No verbalization by pt or tracking with her eyes, anterior gaze throughout        General Comments General comments (skin integrity, edema, etc.): VSS on RA    Exercises     Assessment/Plan    PT Assessment Patient needs continued PT services  PT Problem List Decreased strength;Decreased activity tolerance;Decreased mobility;Decreased balance;Decreased cognition       PT Treatment Interventions DME instruction;Gait training;Functional mobility training;Therapeutic activities;Therapeutic exercise;Balance training;Neuromuscular re-education;Cognitive remediation;Patient/family education;Wheelchair mobility training    PT Goals (Current goals can be found in the Care Plan section)  Acute Rehab PT Goals Patient Stated Goal: did not state; family wishes for her to improve PT Goal Formulation: With family Time For Goal Achievement: 08/21/22 Potential to Achieve Goals: Fair    Frequency Min 4X/week     Co-evaluation               AM-PAC PT "6 Clicks" Mobility  Outcome Measure Help needed turning from your back to your side while in a flat bed without using bedrails?: Total Help needed moving from lying on your back to sitting on the side of a flat bed without using bedrails?: Total Help needed moving to and from a  bed to a chair (including a wheelchair)?: Total Help needed standing up from a chair using your arms (e.g., wheelchair or bedside chair)?: Total Help needed to walk in hospital room?: Total Help needed climbing 3-5 steps with a railing? : Total 6 Click Score: 6    End of Session   Activity Tolerance: Patient limited by lethargy Patient left: in bed;with call bell/phone within reach;with bed alarm set;with family/visitor present Nurse Communication: Mobility status;Other (comment) (pt warm to touch) PT Visit Diagnosis: Muscle weakness (generalized)  (M62.81);Difficulty in walking, not elsewhere classified (R26.2);Other symptoms and signs involving the nervous system (R29.898)    Time: 4163-8453 PT Time Calculation (min) (ACUTE ONLY): 22 min   Charges:   PT Evaluation $PT Eval Moderate Complexity: 1 Mod          Moishe Spice, PT, DPT Acute Rehabilitation Services  Office: [phone]   Orvan Falconer 08/07/2022, 1:30 PM

## 2022-08-07 NOTE — Procedures (Addendum)
[name]  MRN: [id number]  Epilepsy Attending: Su Monks MD Referring Physician/Provider: Donnetta Simpers, MD  Date: 08/07/2022 Duration: 24.09 min   Patient history: 66 year old female with alteration in mental status. EEG to evaluate for seizure   Level of alertness:  lethargic    AEDs during EEG study: None   Technical aspects: This EEG study was done with scalp electrodes positioned according to the 10-20 International system of electrode placement. Electrical activity was reviewed with band pass filter of 1-'70Hz'$ , sensitivity of 7 uV/mm, display speed of 43m/sec with a '60Hz'$  notched filter applied as appropriate. EEG data were recorded continuously and digitally stored.  Video monitoring was available and reviewed as appropriate.   Description: EEG showed continuous generalized 3 to 6 Hz theta-delta slowing. Generalized periodic discharges with triphasic morphology at  1.5-'2Hz'$  were also noted, more prominent when awake/stimulated. Hyperventilation and photic stimulation were not performed.      ABNORMALITY - Periodic discharges with triphasic morphology, generalized ( GPDs) - Continuous slow, generalized   IMPRESSION: This study showed generalized periodic discharges with triphasic morphology which can be on the ictal-interictal continuum. However, the morphology, frequency and reactivity to stimulation is more commonly indicative of toxic-metabolic causes. Additionally there is moderate to severe diffuse encephalopathy, nonspecific etiology. No seizures were seen throughout the recording. This EEG is unchanged compared to yesterday.  CSu Monks MD [facility] [phone] If 7pm- 7am, please page neurology on call as listed in ACement

## 2022-08-07 NOTE — Progress Notes (Signed)
EEG complete - results pending 

## 2022-08-07 NOTE — Progress Notes (Signed)
STROKE TEAM PROGRESS NOTE   SUBJECTIVE (INTERVAL HISTORY) Her daughter and grandsons are at the bedside.  Overall her condition is stable. Pt still open eyes on stimulation but not tracking, not blinking to visual threat bilaterally. She said her name to me just one time, but not responding to any other questions or following commands. Still on heparin IV.  EEG this am no seizure. Intermittent coughing and still has mild tachypnea.    OBJECTIVE Temp:  [97.8 F (36.6 C)-101.4 F (38.6 C)] 97.8 F (36.6 C) (10/15 1154) Pulse Rate:  [96-113] 102 (10/15 1154) Cardiac Rhythm: Supraventricular tachycardia (10/15 1143) Resp:  [0-30] 19 (10/15 1154) BP: (124-143)/(75-100) 132/83 (10/15 1154) SpO2:  [92 %-94 %] 93 % (10/15 1154)  Recent Labs  Lab 08/02/22 1506  GLUCAP 149*   Recent Labs  Lab 08/02/22 1902 08/03/22 0410 08/04/22 0140 08/05/22 0314 08/06/22 0133 08/07/22 0618  NA  --  134* 133* 133* 136 136  K  --  3.4* 3.2* 3.2* 3.2* 3.2*  CL  --  100 101 104 107 104  CO2  --  23 20* 15* 18* 23  GLUCOSE  --  136* 125* 157* 144* 153*  BUN  --  7* '8 8 11 14  '$ CREATININE  --  0.66 0.63 0.87 0.72 0.68  CALCIUM  --  8.2* 8.1* 8.0* 7.9* 7.8*  MG  --  1.9  --   --   --   --   PHOS 2.6  --   --   --   --   --    Recent Labs  Lab 08/02/22 1351  AST 22  ALT 14  ALKPHOS 118  BILITOT 0.9  PROT 7.5  ALBUMIN 3.2*   Recent Labs  Lab 08/02/22 1400 08/02/22 1902 08/04/22 0140 08/05/22 0314 08/06/22 0133 08/07/22 0218 08/07/22 0618  WBC 54.5*   < > 48.2* 46.3* 55.1* 54.5* 63.8*  NEUTROABS 5.8  --   --   --   --   --  13.4*  HGB 13.6   < > 12.2 13.1 13.1 10.9* 12.9  HCT 39.7   < > 35.0* 39.1 37.5 30.8* 36.2  MCV 94.3   < > 92.1 93.3 93.5 93.3 93.3  PLT 296   < > 215 213 196 181 208   < > = values in this interval not displayed.   No results for input(s): "CKTOTAL", "CKMB", "CKMBINDEX", "TROPONINI" in the last 168 hours. No results for input(s): "LABPROT", "INR" in the last 72  hours. No results for input(s): "COLORURINE", "LABSPEC", "PHURINE", "GLUCOSEU", "HGBUR", "BILIRUBINUR", "KETONESUR", "PROTEINUR", "UROBILINOGEN", "NITRITE", "LEUKOCYTESUR" in the last 72 hours.  Invalid input(s): "APPERANCEUR"     Component Value Date/Time   CHOL 127 08/03/2022 0905   TRIG 97 08/03/2022 0905   HDL 17 (L) 08/03/2022 0905   CHOLHDL 7.5 08/03/2022 0905   VLDL 19 08/03/2022 0905   LDLCALC 91 08/03/2022 0905   Lab Results  Component Value Date   HGBA1C 6.0 (H) 08/03/2022   No results found for: "LABOPIA", "COCAINSCRNUR", "LABBENZ", "AMPHETMU", "THCU", "LABBARB"  No results for input(s): "ETH" in the last 168 hours.  I have personally reviewed the radiological images below and agree with the radiology interpretations.  EEG adult  Result Date: 08/07/2022 Derek Jack, MD     08/07/2022 12:34 PM Patient Name: SHARRON PETRUSKA MRN: [id number] Epilepsy Attending: Su Monks MD Referring Physician/Provider: Donnetta Simpers, MD Date: 08/07/2022 Duration: 24.09 min  Patient history: 66 year old female with alteration  in mental status. EEG to evaluate for seizure  Level of alertness:  lethargic  AEDs during EEG study: None  Technical aspects: This EEG study was done with scalp electrodes positioned according to the 10-20 International system of electrode placement. Electrical activity was reviewed with band pass filter of 1-'70Hz'$ , sensitivity of 7 uV/mm, display speed of 20m/sec with a '60Hz'$  notched filter applied as appropriate. EEG data were recorded continuously and digitally stored.  Video monitoring was available and reviewed as appropriate.  Description: EEG showed continuous generalized 3 to 6 Hz theta-delta slowing. Generalized periodic discharges with triphasic morphology at  1.5-'2Hz'$  were also noted, more prominent when awake/stimulated. Hyperventilation and photic stimulation were not performed.    ABNORMALITY - Periodic discharges with triphasic morphology, generalized (  GPDs) - Continuous slow, generalized  IMPRESSION: This study showed generalized periodic discharges with triphasic morphology which can be on the ictal-interictal continuum. However, the morphology, frequency and reactivity to stimulation is more commonly indicative of toxic-metabolic causes. Additionally there is moderate to severe diffuse encephalopathy, nonspecific etiology. No seizures were seen throughout the recording. This EEG is unchanged compared to yesterday. CSu Monks MD [facility] [phone] 7pm- 7am, please page neurology [place]   DG CHEST PORT 1 VIEW  Result Date: 08/06/2022 CLINICAL DATA:  Acute respiratory failure with hypoxia EXAM: PORTABLE CHEST 1 VIEW COMPARISON:  Chest x-ray 08/02/2022.  Chest CT 08/03/2022. FINDINGS: Bilateral pulmonary nodular densities are again noted and similar to the prior study. There is increased central pulmonary vascular congestion. The cardiomediastinal silhouette appears stable. There is no pneumothorax or acute fracture. IMPRESSION: 1. Increased central pulmonary vascular congestion. 2. Stable bilateral pulmonary nodules. Electronically Signed   By: ARonney AstersM.D.   On: 08/06/2022 17:33   EEG adult  Result Date: 08/06/2022 YLora Havens MD     08/06/2022  8:43 AM Patient Name: SLAREEN MULLINGSMRN: [id number] Attending: PLora HavensReferring Physician/Provider: KDonnetta Simpers MD Date: 08/06/2022 Duration: 23.12 mins Patient history: 66year old female with alteration in mental status. EEG to evaluate for seizure Level of alertness:  lethargic AEDs during EEG study: None Technical aspects: This EEG study was done with scalp electrodes positioned according to the 10-20 International system of electrode placement. Electrical activity was reviewed with band pass filter of 1-'70Hz'$ , sensitivity of 7 uV/mm, display speed of 385msec with a '60Hz'$  notched filter applied as appropriate. EEG data were  recorded continuously and digitally stored.  Video monitoring was available and reviewed as appropriate. Description: EEG showed continuous generalized 3 to 6 Hz theta-delta slowing. Generalized periodic discharges with triphasic morphology at  1.5-'2Hz'$  were also noted, more prominent when awake/stimulated. Hyperventilation and photic stimulation were not performed.   ABNORMALITY - Periodic discharges with triphasic morphology, generalized ( GPDs) - Continuous slow, generalized IMPRESSION: This study showed generalized periodic discharges with triphasic morphology which can be on the ictal-interictal continuum. However, the morphology, frequency and reactivity to stimulation is more commonly indicative of toxic-metabolic causes. Additionally there is moderate to severe diffuse encephalopathy, nonspecific etiology. No seizures were seen throughout the recording. PrLora Havens MR BRAIN WO CONTRAST  Result Date: 08/05/2022 CLINICAL DATA:  History of stroke, mental status change EXAM: MRI HEAD WITHOUT CONTRAST TECHNIQUE: Multiplanar, multiecho pulse sequences of the brain and surrounding structures were obtained without intravenous contrast. COMPARISON:  CT head 1 day prior, brain MRI 08/02/2022 FINDINGS: Brain: Again seen are extensive acute infarcts throughout both cerebral and cerebellar hemispheres  across bilateral ACA, MCA, and PCA distributions, overall increased in extent compared to the MRI from 3 days prior. There is associated FLAIR signal abnormality and a few punctate foci of SWI signal dropout suspicious for petechial hemorrhage but no organized hematoma or mass effect. The foci of intrinsic T1 hyperintensity in the occipital lobes may be due to petechial hemorrhage. There is no acute intracranial hemorrhage or extra-axial fluid collection Background parenchymal volume is normal. The ventricles are stable in size. There is no mass lesion. There is no mass effect or midline shift. Vascular: Normal  flow voids. Skull and upper cervical spine: Normal marrow signal. Sinuses/Orbits: The paranasal sinuses are clear. The globes and orbits are unremarkable. Other: None. IMPRESSION: Extensive acute infarcts throughout both cerebral and cerebellar hemispheres across multiple vascular distributions again favored embolic with differential including watershed ischemia, increased in extent since the MRI from 3 days prior. Suspect some petechial hemorrhage but no hematoma or mass effect. Electronically Signed   By: Valetta Mole M.D.   On: 08/05/2022 19:45   ECHO TEE  Result Date: 08/05/2022    TRANSESOPHOGEAL ECHO REPORT   Patient Name:   DESAREA OHAGAN Date of Exam: 08/05/2022 Medical Rec #:  [id number]     Height:       63.0 in Accession #:    [id number]    Weight:       160.0 lb Date of Birth:  Mar 19, 1956     BSA:          1.759 m Patient Age:    41 years      BP:           125/78 mmHg Patient Gender: F             HR:           99 bpm. Exam Location:  Inpatient Procedure: Transesophageal Echo, 3D Echo, Color Doppler, Cardiac Doppler and            Saline Contrast Bubble Study Indications:     Stroke i63.9  History:         Patient has prior history of Echocardiogram examinations, most                  recent 08/03/2022. Risk Factors:Hypertension, Diabetes and                  Dyslipidemia.  Sonographer:     Raquel Sarna Senior RDCS Referring Phys:  [id number] Margie Billet Diagnosing Phys: Oswaldo Milian MD PROCEDURE: After discussion of the risks and benefits of a TEE, an informed consent was obtained from the patient. The transesophogeal probe was passed without difficulty through the esophogus of the patient. Sedation performed by different physician. The patient was monitored while under deep sedation. Anesthestetic sedation was provided intravenously by Anesthesiology: '70mg'$  of Propofol. The patient developed no complications during the procedure. IMPRESSIONS  1. Left ventricular ejection fraction, by  estimation, is 55 to 60%. The left ventricle has normal function.  2. Right ventricular systolic function is normal. The right ventricular size is normal. There is moderately elevated pulmonary artery systolic pressure.  3. No left atrial/left atrial appendage thrombus was detected.  4. The mitral valve is normal in structure. Mild mitral valve regurgitation.  5. The aortic valve is tricuspid. Aortic valve regurgitation is not visualized.  6. Evidence of atrial level shunting detected by color flow Doppler. Agitated saline contrast bubble study was positive with shunting observed within 3-6 cardiac cycles suggestive of  interatrial shunt. Few bubbles seen in left atrium after patient coughed, suggesting small PFO. Small PFO confirmed visually with left to right shunting.  7. No vegetation seen FINDINGS  Left Ventricle: Left ventricular ejection fraction, by estimation, is 55 to 60%. The left ventricle has normal function. The left ventricular internal cavity size was normal in size. Right Ventricle: The right ventricular size is normal. No increase in right ventricular wall thickness. Right ventricular systolic function is normal. There is moderately elevated pulmonary artery systolic pressure. The tricuspid regurgitant velocity is 3.35 m/s, and with an assumed right atrial pressure of 3 mmHg, the estimated right ventricular systolic pressure is 51.7 mmHg. Left Atrium: Left atrial size was normal in size. No left atrial/left atrial appendage thrombus was detected. Right Atrium: Right atrial size was normal in size. Pericardium: Trivial pericardial effusion is present. Mitral Valve: The mitral valve is normal in structure. Mild mitral valve regurgitation. Tricuspid Valve: The tricuspid valve is normal in structure. Tricuspid valve regurgitation is mild. Aortic Valve: The aortic valve is tricuspid. Aortic valve regurgitation is not visualized. Aortic valve mean gradient measures 6.0 mmHg. Aortic valve peak gradient  measures 10.2 mmHg. Pulmonic Valve: The pulmonic valve was grossly normal. Pulmonic valve regurgitation is not visualized. Aorta: The aortic root is normal in size and structure. IAS/Shunts: Evidence of atrial level shunting detected by color flow Doppler. Agitated saline contrast was given intravenously to evaluate for intracardiac shunting. Agitated saline contrast bubble study was positive with shunting observed within 3-6 cardiac cycles suggestive of interatrial shunt.  AORTIC VALVE AV Vmax:           160.00 cm/s AV Vmean:          112.000 cm/s AV VTI:            0.244 m AV Peak Grad:      10.2 mmHg AV Mean Grad:      6.0 mmHg LVOT Vmax:         115.00 cm/s LVOT Vmean:        83.500 cm/s LVOT VTI:          0.210 m LVOT/AV VTI ratio: 0.86 TRICUSPID VALVE TR Peak grad:   44.9 mmHg TR Vmax:        335.00 cm/s  SHUNTS Systemic VTI: 0.21 m Oswaldo Milian MD Electronically signed by Oswaldo Milian MD Signature Date/Time: 08/05/2022/4:04:01 PM    Final    CT VENOGRAM HEAD  Result Date: 08/04/2022 CLINICAL DATA:  Dural venous sinus thrombosis suspected. EXAM: CT VENOGRAM HEAD TECHNIQUE: Venographic phase images of the brain were obtained following the administration of intravenous contrast. Multiplanar reformats and maximum intensity projections were generated. RADIATION DOSE REDUCTION: This exam was performed according to the departmental dose-optimization program which includes automated exposure control, adjustment of the mA and/or kV according to patient size and/or use of iterative reconstruction technique. CONTRAST:  49m OMNIPAQUE IOHEXOL 350 MG/ML SOLN COMPARISON:  Head and neck CTA 08/04/2022.  Head MRI 08/02/2022. FINDINGS: The recent MRI demonstrated numerous acute infarcts involving both cerebral hemispheres and cerebellum, the majority of which are occult by CT. No acute intracranial hemorrhage, midline shift, or extra-axial fluid collection is identified. The ventricles are normal in size.  No fracture or suspicious osseous lesion is identified. There is minimal mucosal thickening in the left sphenoid sinus. The mastoid air cells are clear. The orbits are unremarkable. CT venogram images are motion degraded, however the superior sagittal sinus, internal cerebral veins, vein of Galen, straight sinus, and sigmoid  sinuses are enhancing without evidence of thrombosis. The transverse sinuses are small but grossly patent. A small filling defect in the posterior aspect of the superior sagittal sinus is compatible with an arachnoid granulation. IMPRESSION: No evidence of dural venous sinus thrombosis. Electronically Signed   By: Logan Bores M.D.   On: 08/04/2022 16:45   CT ANGIO HEAD NECK W WO CM  Addendum Date: 08/04/2022   ADDENDUM REPORT: 08/04/2022 11:52 ADDENDUM: Findings were discussed with Dr. Eliseo Squires on 08/04/22 at 11:50 AM. Electronically Signed   By: Marin Roberts M.D.   On: 08/04/2022 11:52   Result Date: 08/04/2022 CLINICAL DATA:  Stroke follow-up EXAM: CT ANGIOGRAPHY HEAD AND NECK TECHNIQUE: Multidetector CT imaging of the head and neck was performed using the standard protocol during bolus administration of intravenous contrast. Multiplanar CT image reconstructions and MIPs were obtained to evaluate the vascular anatomy. Carotid stenosis measurements (when applicable) are obtained utilizing NASCET criteria, using the distal internal carotid diameter as the denominator. RADIATION DOSE REDUCTION: This exam was performed according to the departmental dose-optimization program which includes automated exposure control, adjustment of the mA and/or kV according to patient size and/or use of iterative reconstruction technique. CONTRAST:  28m OMNIPAQUE IOHEXOL 350 MG/ML SOLN COMPARISON:  MRI Head 08/02/22 FINDINGS: CT HEAD FINDINGS Brain: No evidence of hemorrhage, hydrocephalus, extra-axial collection or mass lesion/mass effect. Extensive infarcts seen on prior MRI are difficult to definitively  visualize on this exam, but some of them are seen in the right centrum semiovale, bilateral occipital lobes, and in the right cerebellum. Vascular: See below Skull: Normal. Negative for fracture or focal lesion. Sinuses/Orbits: No acute finding. Other: None. Review of the MIP images confirms the above findings CTA NECK FINDINGS Aortic arch: Standard branching. Imaged portion shows no evidence of aneurysm or dissection. No significant stenosis of the major arch vessel origins. Right carotid system: No evidence of dissection, stenosis (50% or greater), or occlusion. Left carotid system: No evidence of dissection, stenosis (50% or greater), or occlusion. Vertebral arteries: Codominant. No evidence of dissection, stenosis (50% or greater), or occlusion. Skeleton: Negative. Other neck: Asymmetrically enlarged right level 5 lymph node measuring up to 6 mm (series 9, image 122). Additional enlarged right level 5 lymph node is present more superiorly measuring 9 mm (series 9, image 106). Enlarged 7 mm left level 5 lymph node is also present (series 9, image 115). 9 mm right level 2A lymph node (series 9, image 94). There is soft tissue stranding along the posterior right suboccipital neck (series 9, image 87), nonspecific. Upper chest: Small bilateral pleural effusions. There is a 1.2 cm right upper lobe pulmonary nodule, better assessed on prior CT chest dated 08/04/2019. There also multiple enlarged mediastinal lymph nodes and left axillary lymph node. Review of the MIP images confirms the above findings CTA HEAD FINDINGS Anterior circulation: No significant stenosis, proximal occlusion, aneurysm, or vascular malformation. Posterior circulation: No significant stenosis, proximal occlusion, aneurysm, or vascular malformation. Venous sinuses: There is a congenitally small left transverse and sigmoid sinus. There is decreased opacification of the right-sided transverse and sigmoid sinus (series 11, image 122). This area of  decreased opacification extends proximally to involve the superior sagittal sinus hazy month Anatomic variants: None Review of the MIP images confirms the above findings IMPRESSION: 1. Asymmetrically decreased opacification of the right-sided transverse and sigmoid sinus is worrisome for dural venous sinus thrombosis. Recommend further evaluation with a CT venogram. 2. No intracranial LVO or significant arterial stenosis in the neck. 3.  No acute intracranial process. 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 4. Multiple enlarged lymph nodes in the neck, mediastinum, and left axilla, which are compatible with clinically suspected lymphoma. 5. Partially imaged pleural effusions and right sided pulmonary nodule, better seen on prior CTA chest. Electronically Signed: By: Marin Roberts M.D. On: 08/04/2022 11:17   VAS [place] LOWER EXTREMITY VENOUS (DVT)  Result Date: 08/03/2022  Lower Venous DVT Study Patient Name:  VELEDA MUN  Date of Exam:   08/03/2022 Medical Rec #: [id number]      Accession #:    [id number] Date of Birth: [birth year]      Patient Gender: F Patient Age:   66 years Exam Location:  [facility] Procedure:      VAS [place] LOWER EXTREMITY VENOUS (DVT) Referring Phys: TIMOTHY OPYD --------------------------------------------------------------------------------  Indications: Elevated d-dimer, lymphoma.  Comparison       06-15-2021 Prior left lower extremity venous was negative for Study:           DVT. Performing Technologist: Darlin Coco RDMS, RVT  Examination Guidelines: A complete evaluation includes B-mode imaging, spectral Doppler, color Doppler, and power Doppler as needed of all accessible portions of each vessel. Bilateral testing is considered an integral part of a complete examination. Limited examinations for reoccurring indications  may be performed as noted. The reflux portion of the exam is performed with the patient in reverse Trendelenburg.  +---------+---------------+---------+-----------+----------+--------------+ RIGHT    CompressibilityPhasicitySpontaneityPropertiesThrombus Aging +---------+---------------+---------+-----------+----------+--------------+ CFV      Full           Yes      Yes                                 +---------+---------------+---------+-----------+----------+--------------+ SFJ      Full                                                        +---------+---------------+---------+-----------+----------+--------------+ FV Prox  Full                                                        +---------+---------------+---------+-----------+----------+--------------+ FV Mid   Full                                                        +---------+---------------+---------+-----------+----------+--------------+ FV DistalFull                                                        +---------+---------------+---------+-----------+----------+--------------+ PFV      Full                                                        +---------+---------------+---------+-----------+----------+--------------+  POP      Full           Yes      Yes                                 +---------+---------------+---------+-----------+----------+--------------+ PTV      Full                                                        +---------+---------------+---------+-----------+----------+--------------+ PERO     Full                                                        +---------+---------------+---------+-----------+----------+--------------+ Gastroc  Full                                                        +---------+---------------+---------+-----------+----------+--------------+   +---------+---------------+---------+-----------+----------+--------------+ LEFT      CompressibilityPhasicitySpontaneityPropertiesThrombus Aging +---------+---------------+---------+-----------+----------+--------------+ CFV      Full           Yes      Yes                                 +---------+---------------+---------+-----------+----------+--------------+ SFJ      Full                                                        +---------+---------------+---------+-----------+----------+--------------+ FV Prox  Full                                                        +---------+---------------+---------+-----------+----------+--------------+ FV Mid   Full                                                        +---------+---------------+---------+-----------+----------+--------------+ FV DistalFull                                                        +---------+---------------+---------+-----------+----------+--------------+ PFV      Full                                                        +---------+---------------+---------+-----------+----------+--------------+  POP      Full           Yes      Yes                                 +---------+---------------+---------+-----------+----------+--------------+ PTV      Full                                                        +---------+---------------+---------+-----------+----------+--------------+ PERO     Partial        No       No                   Acute          +---------+---------------+---------+-----------+----------+--------------+ Gastroc  Full                                                        +---------+---------------+---------+-----------+----------+--------------+     Summary: RIGHT: - There is no evidence of deep vein thrombosis in the lower extremity.  - No cystic structure found in the popliteal fossa.  - Ultrasound characteristics of enlarged lymph nodes are noted in the groin.  LEFT: - Findings consistent with acute deep vein thrombosis  involving the left peroneal veins.  - No cystic structure found in the popliteal fossa.  - Ultrasound characteristics of enlarged lymph nodes noted in the groin.  *See table(s) above for measurements and observations. Electronically signed by Harold Barban MD on 08/03/2022 at 9:30:05 PM.    Final    ECHOCARDIOGRAM COMPLETE  Result Date: 08/03/2022    ECHOCARDIOGRAM REPORT   Patient Name:   TANAIRY PAYEUR Date of Exam: 08/03/2022 Medical Rec #:  [id number]     Height:       63.0 in Accession #:    [id number]    Weight:       160.0 lb Date of Birth:  13-Aug-1956     BSA:          1.759 m Patient Age:    78 years      BP:           152/94 mmHg Patient Gender: F             HR:           110 bpm. Exam Location:  Inpatient Procedure: 2D Echo Indications:    stroke  History:        Patient has prior history of Echocardiogram examinations and                 Patient has no prior history of Echocardiogram examinations.                 Sepsis; Risk Factors:Hypertension.  Sonographer:    Johny Chess RDCS Referring Phys: [id number] RIPUDEEP K RAI  Sonographer Comments: Image acquisition challenging due to uncooperative patient. IMPRESSIONS  1. Left ventricular ejection fraction, by estimation, is 60 to 65%. The left ventricle has normal function. The left ventricle has no regional wall motion abnormalities. There is mild concentric left ventricular hypertrophy.  Left ventricular diastolic parameters are consistent with Grade I diastolic dysfunction (impaired relaxation).  2. Right ventricular systolic function is normal. The right ventricular size is normal. Tricuspid regurgitation signal is inadequate for assessing PA pressure.  3. The mitral valve is normal in structure. No evidence of mitral valve regurgitation. No evidence of mitral stenosis.  4. The aortic valve is tricuspid. There is mild calcification of the aortic valve. Aortic valve regurgitation is not visualized. No aortic stenosis is present.  5. The inferior vena  cava is normal in size with greater than 50% respiratory variability, suggesting right atrial pressure of 3 mmHg. FINDINGS  Left Ventricle: Left ventricular ejection fraction, by estimation, is 60 to 65%. The left ventricle has normal function. The left ventricle has no regional wall motion abnormalities. The left ventricular internal cavity size was normal in size. There is  mild concentric left ventricular hypertrophy. Left ventricular diastolic parameters are consistent with Grade I diastolic dysfunction (impaired relaxation). Right Ventricle: The right ventricular size is normal. No increase in right ventricular wall thickness. Right ventricular systolic function is normal. Tricuspid regurgitation signal is inadequate for assessing PA pressure. Left Atrium: Left atrial size was normal in size. Right Atrium: Right atrial size was normal in size. Pericardium: There is no evidence of pericardial effusion. Mitral Valve: The mitral valve is normal in structure. There is mild calcification of the mitral valve leaflet(s). Mild mitral annular calcification. No evidence of mitral valve regurgitation. No evidence of mitral valve stenosis. Tricuspid Valve: The tricuspid valve is normal in structure. Tricuspid valve regurgitation is not demonstrated. Aortic Valve: The aortic valve is tricuspid. There is mild calcification of the aortic valve. Aortic valve regurgitation is not visualized. No aortic stenosis is present. Pulmonic Valve: The pulmonic valve was normal in structure. Pulmonic valve regurgitation is not visualized. Aorta: The aortic root is normal in size and structure. Venous: The inferior vena cava is normal in size with greater than 50% respiratory variability, suggesting right atrial pressure of 3 mmHg. IAS/Shunts: No atrial level shunt detected by color flow Doppler.  LEFT VENTRICLE PLAX 2D LVIDd:         4.20 cm LVIDs:         2.40 cm LV PW:         0.90 cm LV IVS:        1.20 cm LVOT diam:     1.60 cm LV SV:          48 LV SV Index:   27 LVOT Area:     2.01 cm  RIGHT VENTRICLE             IVC RV S prime:     20.30 cm/s  IVC diam: 1.30 cm TAPSE (M-mode): 1.6 cm LEFT ATRIUM             Index        RIGHT ATRIUM          Index LA diam:        3.40 cm 1.93 cm/m   RA Area:     9.64 cm LA Vol (A2C):   26.3 ml 14.95 ml/m  RA Volume:   19.10 ml 10.86 ml/m LA Vol (A4C):   27.5 ml 15.64 ml/m LA Biplane Vol: 27.9 ml 15.86 ml/m  AORTIC VALVE LVOT Vmax:   152.00 cm/s LVOT Vmean:  105.000 cm/s LVOT VTI:    0.238 m  AORTA Ao Root diam: 3.00 cm MV E velocity: 79.60 cm/s MV A velocity: 141.00  cm/s  SHUNTS MV E/A ratio:  0.56         Systemic VTI:  0.24 m                             Systemic Diam: 1.60 cm Dalton McleanMD Electronically signed by Franki Monte Signature Date/Time: 08/03/2022/2:41:22 PM    Final    CT Angio Chest Pulmonary Embolism (PE) W or WO Contrast  Result Date: 08/03/2022 CLINICAL DATA:  Pulmonary embolism suspected, positive D-dimer. Undergoing malignancy workup with recent biopsy suggesting lymphoma. EXAM: CT ANGIOGRAPHY CHEST WITH CONTRAST TECHNIQUE: Multidetector CT imaging of the chest was performed using the standard protocol during bolus administration of intravenous contrast. Multiplanar CT image reconstructions and MIPs were obtained to evaluate the vascular anatomy. RADIATION DOSE REDUCTION: This exam was performed according to the departmental dose-optimization program which includes automated exposure control, adjustment of the mA and/or kV according to patient size and/or use of iterative reconstruction technique. CONTRAST:  40m OMNIPAQUE IOHEXOL 350 MG/ML SOLN COMPARISON:  None Available. FINDINGS: Cardiovascular: The heart is borderline enlarged. There is no pericardial effusion. Scattered coronary artery calcifications are noted. There is atherosclerotic calcification of the aorta without evidence of aneurysm. Pulmonary trunk is normal in caliber. No pulmonary artery filling defect is  seen. Examination is limited due to respiratory motion artifact and pulmonary nodules. Mediastinum/Nodes: Multiple enlarged lymph nodes are present in the mediastinum and hilar regions bilaterally. No axillary lymphadenopathy. Thyroid gland, trachea, and esophagus are within normal limits. Lungs/Pleura: Hazy regional ground-glass opacities are present in the lungs bilaterally. There are multiple scattered pulmonary nodules bilaterally, the largest in the right lower lobe measuring 3.7 cm, axial image 110 and in the left lower lobe measuring 4.1 cm, axial image 94. No effusion or pneumothorax. Upper Abdomen: Enlarged lymph nodes are present in the gastrohepatic ligament measuring up to 1.1 cm. No acute abnormality. Musculoskeletal: No acute or suspicious osseous abnormality. Review of the MIP images confirms the above findings. IMPRESSION: 1. No evidence of pulmonary embolism. 2. Multiple enlarged mediastinal and hilar lymph nodes and pulmonary nodules bilaterally measuring up to 4.1 cm, concerning for neoplastic process or lymphoma. Correlation with recent biopsy results is recommended. 3. Mild hazy geographic ground-glass opacities in the lungs, possible edema or air trapping. 4. Coronary artery calcifications. 5. Aortic atherosclerosis. Electronically Signed   By: LBrett FairyM.D.   On: 08/03/2022 01:10   MR BRAIN WO CONTRAST  Result Date: 08/02/2022 CLINICAL DATA:  Altered mental status EXAM: MRI HEAD WITHOUT CONTRAST TECHNIQUE: Multiplanar, multiecho pulse sequences of the brain and surrounding structures were obtained without intravenous contrast. COMPARISON:  04/15/2013 FINDINGS: Brain: Numerous, diffuse bilateral foci of abnormal diffusion restriction in a pattern that is most suggestive of embolic shower but could also indicate watershed ischemia. No acute hemorrhage. There is multifocal hyperintense T2-weighted signal within the white matter. Generalized volume loss. The midline structures are  normal. Vascular: Major flow voids are preserved. Skull and upper cervical spine: Normal calvarium and skull base. Visualized upper cervical spine and soft tissues are normal. Sinuses/Orbits:No paranasal sinus fluid levels or advanced mucosal thickening. No mastoid or middle ear effusion. Normal orbits. IMPRESSION: 1. Numerous, diffuse bilateral foci of abnormal diffusion restriction in a pattern that is most suggestive of embolic shower but could also indicate watershed ischemia. 2. No acute hemorrhage or mass effect. Electronically Signed   By: KUlyses JarredM.D.   On: 08/02/2022 23:57   CT Head  Wo Contrast  Result Date: 08/02/2022 CLINICAL DATA:  Altered mental status, recent diagnosis of lymphoma EXAM: CT HEAD WITHOUT CONTRAST TECHNIQUE: Contiguous axial images were obtained from the base of the skull through the vertex without intravenous contrast. RADIATION DOSE REDUCTION: This exam was performed according to the departmental dose-optimization program which includes automated exposure control, adjustment of the mA and/or kV according to patient size and/or use of iterative reconstruction technique. COMPARISON:  MR brain done on 04/15/2013 FINDINGS: Brain: No acute intracranial findings are seen in noncontrast CT brain. There are no signs of bleeding within the cranium. Ventricles are not dilated. Cortical sulci are prominent. There is no focal edema or mass effect. Vascular: Unremarkable Skull: Unremarkable. Sinuses/Orbits: Unremarkable. Other: None. IMPRESSION: No acute intracranial findings are seen in noncontrast CT brain. Atrophy. Electronically Signed   By: Elmer Picker M.D.   On: 08/02/2022 16:35   DG Chest 2 View  Result Date: 08/02/2022 CLINICAL DATA:  Cough, altered mental status x3 days EXAM: CHEST - 2 VIEW COMPARISON:  04/21/2021 FINDINGS: Cardiac size is within normal limits. Thoracic aorta is tortuous. There are nodular densities of varying sizes scattered throughout both lungs  largest overlying the inferior right hilum measuring 2.8 cm. Findings suggest possible metastatic disease. Less likely possibility would be nodular appearing multifocal pneumonia. There is no pleural effusion or pneumothorax. Degenerative changes are noted in both shoulders. IMPRESSION: There are multiple nodular densities of varying sizes in both lungs suggesting pulmonary metastatic disease. Follow-up CT, PET-CT and biopsy as warranted should be considered. Please correlate with clinical history for any known primary malignancy. Electronically Signed   By: Elmer Picker M.D.   On: 08/02/2022 15:37     PHYSICAL EXAM  Temp:  [97.8 F (36.6 C)-101.4 F (38.6 C)] 97.8 F (36.6 C) (10/15 1154) Pulse Rate:  [96-113] 102 (10/15 1154) Resp:  [0-30] 19 (10/15 1154) BP: (124-143)/(75-100) 132/83 (10/15 1154) SpO2:  [92 %-94 %] 93 % (10/15 1154)  General - Well nourished, well developed, mild tachypnea.  Ophthalmologic - fundi not visualized due to noncooperation.  Cardiovascular - Regular rhythm and rate.  Neuro - initially sleeping, but eyes open with pain stimulation. With eyes open, eyes midline today, doll's eyes present, not tracking bilaterally, not blinking to visual threat bilaterally. With repetitive asking, she was able to say "Ahni" when asking her name. Not following any other commands. No facial droop. Tongue protrusion not cooperative. Withdraw to pain in all limbs, LLE stronger than other limbs. Sensation, coordination and gait not tested.   ASSESSMENT/PLAN Ms. CRIS GIBBY is a 66 y.o. female with history of recent diagnosed lymphoma, recent MRSA infection admitted for altered mental status, lethargy and not talking. No tPA given due to outside window.    Stroke:  bilateral embolic shower, etiology not quite clear, however could be due to distal DVT in the setting of small PFO or viscosity of blood from lymphoma with significant leukocytosis CT no acute abnormality MRI  16/01 embolic shower bilaterally CTA chest no PE CT head and neck and CTV unremarkable MRI 10/13 extension of embolic shower bilaterally 2D Echo EF 60 to 65% LE venous Doppler left peroneal vein DVT TEE showed small PFO LDL 91 HgbA1c 6.0 Ammonia level normal Heparin IV for VTE prophylaxis No antithrombotic prior to admission, now on heparin IV.  Ongoing aggressive stroke risk factor management Therapy recommendations: Pending Disposition: Pending  Hypertension Stable Long term BP goal normotensive  Hyperlipidemia Home meds: Zetia LDL 91, goal < 70  Consider statin and Zetia after p.o. access Continue statin at discharge  Fever Tmax 101.4->afebrile Tachycardia and tachypnea  Intermittent coughing Blood culture negative, urine culture showed pansensitive E. Coli On Rocephin, Flagyl and vancomycin  Dysphagia Did not pass swallow n.p.o. now Speech on board May consider cortrak for nutrition  PFO LE DVT Small PFO on TEE LE venous Doppler left peroneal vein DVT On heparin IV  Lymphoma Recent diagnosis at [facility] Dr. Lorenso Courier on board WBC 48.8->48.2->46.3->55.1->63.8 Viscosity of the blood can be part of the stroke etiology  Other Stroke Risk Factors Advanced age  Other Active Problems Recent MRSA infection, on vancomycin  Hospital day # 4  I discussed with Dr. Bonner Puna. I also spent extensive time with the patient daughter, more than 50% of which was spent in counseling and coordination of care, reviewing test results, images and medication, and discussing the diagnosis, treatment plan and potential prognosis. This patient's care requiresreview of multiple databases, neurological assessment, discussion with family, other specialists and medical decision making of high complexity.    Rosalin Hawking, MD PhD Stroke Neurology 08/07/2022 1:34 PM    To contact Stroke Continuity provider, please refer to [email or web address] After hours, contact General Neurology

## 2022-08-07 NOTE — Progress Notes (Signed)
Progress Note  Patient: Sarah Walsh DOB: 03-17-1956  DOA: 08/02/2022  DOS: 08/07/2022    Brief hospital course: Sarah Walsh is a 66 y.o. female with a history of T2DM, HLD, HTN, recent cervical LN biopsy with +lymphoma reportedly T cell, not yet started on treatment, presented to ED 10/10 with lethargy, confusion, anorexia, gait instability. MRI brain was positive for scattered cortical and subcortical infarcts. She was admitted, also noted to have fevers, WBC 68.1L with eosinophilic predominance.   Repeat MRI due to worsening level of consciousness showed extension of embolic shower strokes. EEG nonspecific without seizure recorded. Broad IV antibiotics have been continues empirically though no nidus of infection has been identified. Goals of care discussions are ongoing.   Assessment and Plan: Sepsis: Patient met sepsis criteria on admission with tachycardia, fevers, leukocytosis, lactic acidosis, source unclear.   - Follow blood cultures. Note PCT is undetectable x2. DDx fever includes due to lymphoma, central, less likely DVT in and of itself. - Continue ceftriaxone 2g q24h, vancomycin, flagyl empirically though cultures remain negative. - Fungitell pending - Schedule tylenol   Acute metabolic encephalopathy: Most likely related to widespread embolic CVAs.  - EEG with nonspecific changes without seizure activity noted initially. There was reported improvement earlier this morning but abruptly back to her unresponsiveness. Ordered EEG hoping to capture something, confirmed management with Dr. Erlinda Hong.   Acute hypoxic respiratory failure: With opacities on CT, we've been covering w/broad abx which would cover pneumonia. CXR read as increasing volume but exam remains stable and we are continuing IVF to match insensible losses. VBG without respiratory acidosis.  - Support with supplemental oxygen.  - She's maintaining airway at this time. Is currently full code, though palliative  care discussions are ongoing.   Extensive bilateral cortical and subcortical ischemic CVAs: MRI brain showed numerous diffuse bilateral foci of abnormal diffusion restriction in a pattern most suggestive of embolic shower but could also indicate watershed ischemia. Due to worsening lethargy and exam, repeat MRI ordered today and confirms worsening severity of infarct burden.  - PFO found on TEE. In conjunction with know DVT (albeit distal) and embolic distribution, started heparin, goal level 0.3-0.5.  - Neuro checks  Metabolic acidosis: No anion gap. - Improving with bicarb, also with respiratory compensation. Will continue IVF, change back to LR.   Hypokalemia:  - Supplement in IVF.  SVT:  - Metoprolol low dose IV, this appears to be suppressing well, BP tolerating.   HTN:  - BP goal per neurology   Acute RLE peroneal DVT - CTA negative for PE.  - Heparin as discussed above.  - Interestingly, per my discussion with heme/onc, Dr. Lorenso Courier, lymphomas are not classically associated with hypercoagulability. The severity of leukocytosis is also not near the expected threshold to cause leukostasis.    T-cell lymphoma: New diagnosis, has not been started on treatment yet.   - Oncology, Dr. Lorenso Courier consulted. Of course the patient is not currently a candidate for treatment and, given her normal cell counts otherwise, doubt there'd be any benefit to pulsed steroids at this time. There would certainly be risks as well. The family is aware of this discussion. - There is no further work up or treatment currently planned as an inpatient for this issue.    Eosinophilic leukocytosis:  - I've got to think this is related to her T cell lymphoma. Note subtypes of T cell lymphomas can aberrantly express IL-5.  - Work up has not suggested nephritis, hepatitis, She  has no history of this prior to diagnosis, no ongoing drug/allergic reaction, no parasitic infection detected. Could be expected at the time of  her drug eruption after Tx for MRSA recently though that agent was stopped and rash has resolved durably. HIV NR - Will plan to monitor CBC w/differential. Note stable eosinophilia, with rising PMNs  Subjective: Opens eyes but did not track for me or follow commands. RN reported earlier this morning she would answer questions in one word phrases, then began staring off abruptly and has gone back to her not significantly responsive baseline since that time. No convulsions noted.   Objective: Vitals:   08/06/22 2023 08/07/22 0041 08/07/22 0542 08/07/22 0756  BP: 126/81 135/86 124/83 126/85  Pulse: (!) 103 (!) 113  96  Resp: (!) 30 11 (!) 24 16  Temp: 98.7 F (37.1 C) 98.8 F (37.1 C) 98.2 F (36.8 C) 97.8 F (36.6 C)  TempSrc: Oral Oral Oral Tympanic  SpO2: 94% 94% 94% 94%  Weight:      Height:       Gen: No distress, calm Pulm: Remains tachypneic though with less effort, no wheezing or crackles. CV: Regular with rate in low 100's. No murmur, rub, or gallop. No JVD, no pitting dependent edema. GI: Abdomen soft, no palpable bladder or mass, non-distended, with normoactive bowel sounds.  Ext: Warm, dry, no pitting edema.  Skin: No new rashes, lesions or ulcers on visualized skin. No juandice Neuro: Resting with eyes closed, opens to voice but does not follow commands, spontaneously move extremities or track. Pupils slightly contracted but responsive to light. No tremor or saccade.   Data Personally reviewed: CBC: Recent Labs  Lab 08/02/22 1400 08/02/22 1902 08/04/22 0140 08/05/22 0314 08/06/22 0133 08/07/22 0218 08/07/22 0618  WBC 54.5*   < > 48.2* 46.3* 55.1* 54.5* 63.8*  NEUTROABS 5.8  --   --   --   --   --  13.4*  HGB 13.6   < > 12.2 13.1 13.1 10.9* 12.9  HCT 39.7   < > 35.0* 39.1 37.5 30.8* 36.2  MCV 94.3   < > 92.1 93.3 93.5 93.3 93.3  PLT 296   < > 215 213 196 181 208   < > = values in this interval not displayed.   Basic Metabolic Panel: Recent Labs  Lab  08/02/22 1902 08/03/22 0410 08/04/22 0140 08/05/22 0314 08/06/22 0133 08/07/22 0618  NA  --  134* 133* 133* 136 136  K  --  3.4* 3.2* 3.2* 3.2* 3.2*  CL  --  100 101 104 107 104  CO2  --  23 20* 15* 18* 23  GLUCOSE  --  136* 125* 157* 144* 153*  BUN  --  7* $R'8 8 11 14  'oN$ CREATININE  --  0.66 0.63 0.87 0.72 0.68  CALCIUM  --  8.2* 8.1* 8.0* 7.9* 7.8*  MG  --  1.9  --   --   --   --   PHOS 2.6  --   --   --   --   --    GFR: Estimated Creatinine Clearance: 64.3 mL/min (by C-G formula based on SCr of 0.68 mg/dL). Liver Function Tests: Recent Labs  Lab 08/02/22 1351  AST 22  ALT 14  ALKPHOS 118  BILITOT 0.9  PROT 7.5  ALBUMIN 3.2*   No results for input(s): "LIPASE", "AMYLASE" in the last 168 hours. Recent Labs  Lab 08/02/22 1902  AMMONIA 29   Coagulation  Profile: Recent Labs  Lab 08/02/22 1902  INR 1.3*  1.2   Cardiac Enzymes: No results for input(s): "CKTOTAL", "CKMB", "CKMBINDEX", "TROPONINI" in the last 168 hours. BNP (last 3 results) No results for input(s): "PROBNP" in the last 8760 hours. HbA1C: No results for input(s): "HGBA1C" in the last 72 hours. CBG: Recent Labs  Lab 08/02/22 1506  GLUCAP 149*   Lipid Profile: No results for input(s): "CHOL", "HDL", "LDLCALC", "TRIG", "CHOLHDL", "LDLDIRECT" in the last 72 hours. Thyroid Function Tests: No results for input(s): "TSH", "T4TOTAL", "FREET4", "T3FREE", "THYROIDAB" in the last 72 hours. Anemia Panel: No results for input(s): "VITAMINB12", "FOLATE", "FERRITIN", "TIBC", "IRON", "RETICCTPCT" in the last 72 hours. Urine analysis:    Component Value Date/Time   COLORURINE YELLOW 08/02/2022 2137   APPEARANCEUR HAZY (A) 08/02/2022 2137   LABSPEC 1.012 08/02/2022 2137   PHURINE 5.0 08/02/2022 2137   GLUCOSEU NEGATIVE 08/02/2022 2137   HGBUR NEGATIVE 08/02/2022 2137   [place] NEGATIVE 08/02/2022 2137   KETONESUR 5 (A) 08/02/2022 2137   PROTEINUR NEGATIVE 08/02/2022 2137   NITRITE NEGATIVE 08/02/2022  2137   LEUKOCYTESUR TRACE (A) 08/02/2022 2137   Recent Results (from the past 240 hour(s))  Resp Panel by RT-PCR (Flu A&B, Covid) Anterior Nasal Swab     Status: None   Collection Time: 08/02/22  3:10 PM   Specimen: Anterior Nasal Swab  Result Value Ref Range Status   SARS Coronavirus 2 by RT PCR NEGATIVE NEGATIVE Final    Comment: (NOTE) SARS-CoV-2 target nucleic acids are NOT DETECTED.  The SARS-CoV-2 RNA is generally detectable in upper respiratory specimens during the acute phase of infection. The lowest concentration of SARS-CoV-2 viral copies this assay can detect is 138 copies/mL. A negative result does not preclude SARS-Cov-2 infection and should not be used as the sole basis for treatment or other patient management decisions. A negative result may occur with  improper specimen collection/handling, submission of specimen other than nasopharyngeal swab, presence of viral mutation(s) within the areas targeted by this assay, and inadequate number of viral copies(<138 copies/mL). A negative result must be combined with clinical observations, patient history, and epidemiological information. The expected result is Negative.  Fact Sheet for Patients:  EntrepreneurPulse.com.au  Fact Sheet for Healthcare Providers:  IncredibleEmployment.be  This test is no t yet approved or cleared by the [place] FDA and  has been authorized for detection and/or diagnosis of SARS-CoV-2 by FDA under an Emergency Use Authorization (EUA). This EUA will remain  in effect (meaning this test can be used) for the duration of the COVID-19 declaration under Section 564(b)(1) of the Act, 21 U.S.C.section 360bbb-3(b)(1), unless the authorization is terminated  or revoked sooner.       Influenza A by PCR NEGATIVE NEGATIVE Final   Influenza B by PCR NEGATIVE NEGATIVE Final    Comment: (NOTE) The Xpert Xpress SARS-CoV-2/FLU/RSV plus assay is intended as an  aid in the diagnosis of influenza from Nasopharyngeal swab specimens and should not be used as a sole basis for treatment. Nasal washings and aspirates are unacceptable for Xpert Xpress SARS-CoV-2/FLU/RSV testing.  Fact Sheet for Patients: EntrepreneurPulse.com.au  Fact Sheet for Healthcare Providers: IncredibleEmployment.be  This test is not yet approved or cleared by the [place] FDA and has been authorized for detection and/or diagnosis of SARS-CoV-2 by FDA under an Emergency Use Authorization (EUA). This EUA will remain in effect (meaning this test can be used) for the duration of the COVID-19 declaration under Section 564(b)(1) of the Act,  21 U.S.C. section 360bbb-3(b)(1), unless the authorization is terminated or revoked.  Performed at [facility], [place] [address], [place], [place] [address]   Blood culture (routine x 2)     Status: None (Preliminary result)   Collection Time: 08/02/22  3:16 PM   Specimen: BLOOD  Result Value Ref Range Status   Specimen Description BLOOD LEFT ANTECUBITAL  Final   Special Requests   Final    BOTTLES DRAWN AEROBIC AND ANAEROBIC Blood Culture results may not be optimal due to an inadequate volume of blood received in culture bottles   Culture   Final    NO GROWTH 4 DAYS Performed at [facility], [place] [address], [place], [place] [address]    Report Status PENDING  Incomplete  Blood culture (routine x 2)     Status: None (Preliminary result)   Collection Time: 08/02/22  7:02 PM   Specimen: BLOOD  Result Value Ref Range Status   Specimen Description BLOOD LEFT ANTECUBITAL  Final   Special Requests   Final    BOTTLES DRAWN AEROBIC AND ANAEROBIC Blood Culture adequate volume   Culture   Final    NO GROWTH 4 DAYS Performed at [facility], [place] [address] [place],  [address]    Report Status PENDING  Incomplete  Urine Culture     Status: Abnormal   Collection Time: 08/02/22   9:37 PM   Specimen: Urine, Clean Catch  Result Value Ref Range Status   Specimen Description URINE, CLEAN CATCH  Final   Special Requests   Final    NONE Performed at [facility], [place] [address], [place], [place] [address]    Culture 10,000 COLONIES/mL ESCHERICHIA COLI (A)  Final   Report Status 08/05/2022 FINAL  Final   Organism ID, Bacteria ESCHERICHIA COLI (A)  Final      Susceptibility   Escherichia coli - MIC*    AMPICILLIN 8 SENSITIVE Sensitive     CEFAZOLIN <=4 SENSITIVE Sensitive     CEFEPIME <=0.12 SENSITIVE Sensitive     CEFTRIAXONE <=0.25 SENSITIVE Sensitive     CIPROFLOXACIN <=0.25 SENSITIVE Sensitive     GENTAMICIN <=1 SENSITIVE Sensitive     IMIPENEM <=0.25 SENSITIVE Sensitive     NITROFURANTOIN <=16 SENSITIVE Sensitive     TRIMETH/SULFA <=20 SENSITIVE Sensitive     AMPICILLIN/SULBACTAM 4 SENSITIVE Sensitive     PIP/TAZO <=4 SENSITIVE Sensitive     * 10,000 COLONIES/mL ESCHERICHIA COLI     DG CHEST PORT 1 VIEW  Result Date: 08/06/2022 CLINICAL DATA:  Acute respiratory failure with hypoxia EXAM: PORTABLE CHEST 1 VIEW COMPARISON:  Chest x-ray 08/02/2022.  Chest CT 08/03/2022. FINDINGS: Bilateral pulmonary nodular densities are again noted and similar to the prior study. There is increased central pulmonary vascular congestion. The cardiomediastinal silhouette appears stable. There is no pneumothorax or acute fracture. IMPRESSION: 1. Increased central pulmonary vascular congestion. 2. Stable bilateral pulmonary nodules. Electronically Signed   By: Ronney Asters M.D.   On: 08/06/2022 17:33   EEG adult  Result Date: 08/06/2022 Lora Havens, MD     08/06/2022  8:43 AM Patient Name: QUINNETTA ROEPKE MRN: [id number] Epilepsy Attending: Lora Havens Referring Physician/Provider: Donnetta Simpers, MD Date: 08/06/2022 Duration: 23.12 mins Patient history: 66 year old female with alteration in mental status. EEG to evaluate for seizure Level of alertness:  lethargic  AEDs during EEG study: None Technical aspects: This EEG study was done with scalp electrodes positioned  according to the 10-20 International system of electrode placement. Electrical activity was reviewed with band pass filter of 1-$RemoveBef'70Hz'AODKLEqAyQ$ , sensitivity of 7 uV/mm, display speed of 8mm/sec with a $Remo'60Hz'Tgcdm$  notched filter applied as appropriate. EEG data were recorded continuously and digitally stored.  Video monitoring was available and reviewed as appropriate. Description: EEG showed continuous generalized 3 to 6 Hz theta-delta slowing. Generalized periodic discharges with triphasic morphology at  1.5-$Remov'2Hz'waLXuh$  were also noted, more prominent when awake/stimulated. Hyperventilation and photic stimulation were not performed.   ABNORMALITY - Periodic discharges with triphasic morphology, generalized ( GPDs) - Continuous slow, generalized IMPRESSION: This study showed generalized periodic discharges with triphasic morphology which can be on the ictal-interictal continuum. However, the morphology, frequency and reactivity to stimulation is more commonly indicative of toxic-metabolic causes. Additionally there is moderate to severe diffuse encephalopathy, nonspecific etiology. No seizures were seen throughout the recording. Lora Havens   MR BRAIN WO CONTRAST  Result Date: 08/05/2022 CLINICAL DATA:  History of stroke, mental status change EXAM: MRI HEAD WITHOUT CONTRAST TECHNIQUE: Multiplanar, multiecho pulse sequences of the brain and surrounding structures were obtained without intravenous contrast. COMPARISON:  CT head 1 day prior, brain MRI 08/02/2022 FINDINGS: Brain: Again seen are extensive acute infarcts throughout both cerebral and cerebellar hemispheres across bilateral ACA, MCA, and PCA distributions, overall increased in extent compared to the MRI from 3 days prior. There is associated FLAIR signal abnormality and a few punctate foci of SWI signal dropout suspicious for petechial hemorrhage but no organized  hematoma or mass effect. The foci of intrinsic T1 hyperintensity in the occipital lobes may be due to petechial hemorrhage. There is no acute intracranial hemorrhage or extra-axial fluid collection Background parenchymal volume is normal. The ventricles are stable in size. There is no mass lesion. There is no mass effect or midline shift. Vascular: Normal flow voids. Skull and upper cervical spine: Normal marrow signal. Sinuses/Orbits: The paranasal sinuses are clear. The globes and orbits are unremarkable. Other: None. IMPRESSION: Extensive acute infarcts throughout both cerebral and cerebellar hemispheres across multiple vascular distributions again favored embolic with differential including watershed ischemia, increased in extent since the MRI from 3 days prior. Suspect some petechial hemorrhage but no hematoma or mass effect. Electronically Signed   By: Valetta Mole M.D.   On: 08/05/2022 19:45   ECHO TEE  Result Date: 08/05/2022    TRANSESOPHOGEAL ECHO REPORT   Patient Name:   JENNICA TAGLIAFERRI Date of Exam: 08/05/2022 Medical Rec #:  [id number]     Height:       63.0 in Accession #:    [id number]    Weight:       160.0 lb Date of Birth:  15-Nov-1955     BSA:          1.759 m Patient Age:    34 years      BP:           125/78 mmHg Patient Gender: F             HR:           99 bpm. Exam Location:  Inpatient Procedure: Transesophageal Echo, 3D Echo, Color Doppler, Cardiac Doppler and            Saline Contrast Bubble Study Indications:     Stroke i63.9  History:         Patient has prior history of Echocardiogram examinations, most  recent 08/03/2022. Risk Factors:Hypertension, Diabetes and                  Dyslipidemia.  Sonographer:     Raquel Sarna Senior RDCS Referring Phys:  [id number] Margie Billet Diagnosing Phys: Oswaldo Milian MD PROCEDURE: After discussion of the risks and benefits of a TEE, an informed consent was obtained from the patient. The transesophogeal probe was passed without  difficulty through the esophogus of the patient. Sedation performed by different physician. The patient was monitored while under deep sedation. Anesthestetic sedation was provided intravenously by Anesthesiology: 70mg  of Propofol. The patient developed no complications during the procedure. IMPRESSIONS  1. Left ventricular ejection fraction, by estimation, is 55 to 60%. The left ventricle has normal function.  2. Right ventricular systolic function is normal. The right ventricular size is normal. There is moderately elevated pulmonary artery systolic pressure.  3. No left atrial/left atrial appendage thrombus was detected.  4. The mitral valve is normal in structure. Mild mitral valve regurgitation.  5. The aortic valve is tricuspid. Aortic valve regurgitation is not visualized.  6. Evidence of atrial level shunting detected by color flow Doppler. Agitated saline contrast bubble study was positive with shunting observed within 3-6 cardiac cycles suggestive of interatrial shunt. Few bubbles seen in left atrium after patient coughed, suggesting small PFO. Small PFO confirmed visually with left to right shunting.  7. No vegetation seen FINDINGS  Left Ventricle: Left ventricular ejection fraction, by estimation, is 55 to 60%. The left ventricle has normal function. The left ventricular internal cavity size was normal in size. Right Ventricle: The right ventricular size is normal. No increase in right ventricular wall thickness. Right ventricular systolic function is normal. There is moderately elevated pulmonary artery systolic pressure. The tricuspid regurgitant velocity is 3.35 m/s, and with an assumed right atrial pressure of 3 mmHg, the estimated right ventricular systolic pressure is 81.8 mmHg. Left Atrium: Left atrial size was normal in size. No left atrial/left atrial appendage thrombus was detected. Right Atrium: Right atrial size was normal in size. Pericardium: Trivial pericardial effusion is present. Mitral  Valve: The mitral valve is normal in structure. Mild mitral valve regurgitation. Tricuspid Valve: The tricuspid valve is normal in structure. Tricuspid valve regurgitation is mild. Aortic Valve: The aortic valve is tricuspid. Aortic valve regurgitation is not visualized. Aortic valve mean gradient measures 6.0 mmHg. Aortic valve peak gradient measures 10.2 mmHg. Pulmonic Valve: The pulmonic valve was grossly normal. Pulmonic valve regurgitation is not visualized. Aorta: The aortic root is normal in size and structure. IAS/Shunts: Evidence of atrial level shunting detected by color flow Doppler. Agitated saline contrast was given intravenously to evaluate for intracardiac shunting. Agitated saline contrast bubble study was positive with shunting observed within 3-6 cardiac cycles suggestive of interatrial shunt.  AORTIC VALVE AV Vmax:           160.00 cm/s AV Vmean:          112.000 cm/s AV VTI:            0.244 m AV Peak Grad:      10.2 mmHg AV Mean Grad:      6.0 mmHg LVOT Vmax:         115.00 cm/s LVOT Vmean:        83.500 cm/s LVOT VTI:          0.210 m LVOT/AV VTI ratio: 0.86 TRICUSPID VALVE TR Peak grad:   44.9 mmHg TR Vmax:  335.00 cm/s  SHUNTS Systemic VTI: 0.21 m Oswaldo Milian MD Electronically signed by Oswaldo Milian MD Signature Date/Time: 08/05/2022/4:04:01 PM    Final     Family Communication: Daughter, grandsons at bedside  Disposition: Status is: Inpatient Remains inpatient appropriate because: Critical illness Planned Discharge Destination:  TBD, guarded prognosis  Patrecia Pour, MD 08/07/2022 9:03 AM Page by [email or web address]

## 2022-08-07 NOTE — Progress Notes (Signed)
ANTICOAGULATION CONSULT NOTE - Follow Up Consult  Pharmacy Consult for heparin Indication:  multiple embolic strokes with paradoxical embolism and DVT  Labs: Recent Labs    08/05/22 0314 08/05/22 0314 08/06/22 0133 08/06/22 0944 08/06/22 1751 08/07/22 0218 08/07/22 0618  HGB 13.1  --  13.1  --   --  10.9* 12.9  HCT 39.1  --  37.5  --   --  30.8* 36.2  PLT 213  --  196  --   --  181 208  HEPARINUNFRC  --    < > 0.12* 0.23* 0.33 0.35  --   CREATININE 0.87  --  0.72  --   --   --   --    < > = values in this interval not displayed.     Assessment: 66yo female subtherapeutic on heparin with initial cautious dosing for paradoxical embolism and DVT in setting of multiple embolic strokes.   Heparin level returned at 0.35, at goal. No infusion issues or signs of bleeding per nursing. Hgb and plts trending up and both WNL  Goal of Therapy:  Heparin level 0.3-0.5 units/ml per neurology   Plan:  Continue heparin at 1500 units/hr Monitor for signs/symptoms of bleeding Monitor CBC daily with heparin level  Sandford Craze, PharmD. [facility] Acute Care PGY-1  08/07/2022 7:17 AM

## 2022-08-07 NOTE — Progress Notes (Signed)
Inpatient Rehab Admissions Coordinator:   Per therapy recommendations, patient was screened for CIR candidacy by Clemens Catholic, MS, CCC-SLP. At this time, Pt. Is total A with bed mobility and has not attempted Oob. She is not currently at a   Pt. may have potential to progress to becoming a potential CIR candidate, so CIR admissions team will follow and monitor for progress and participation with therapies and place consult order if Pt. appears to be an appropriate candidate. Please contact me with any questions.   Clemens Catholic, [place], [place] Admissions Coordinator  [phone] ([place]) [phone] (office)

## 2022-08-08 ENCOUNTER — Inpatient Hospital Stay (HOSPITAL_COMMUNITY): Payer: Medicare HMO

## 2022-08-08 DIAGNOSIS — E440 Moderate protein-calorie malnutrition: Secondary | ICD-10-CM | POA: Insufficient documentation

## 2022-08-08 DIAGNOSIS — I10 Essential (primary) hypertension: Secondary | ICD-10-CM | POA: Diagnosis not present

## 2022-08-08 DIAGNOSIS — I82451 Acute embolism and thrombosis of right peroneal vein: Secondary | ICD-10-CM | POA: Diagnosis not present

## 2022-08-08 DIAGNOSIS — I82452 Acute embolism and thrombosis of left peroneal vein: Secondary | ICD-10-CM | POA: Diagnosis not present

## 2022-08-08 DIAGNOSIS — C8448 Peripheral T-cell lymphoma, not classified, lymph nodes of multiple sites: Secondary | ICD-10-CM

## 2022-08-08 DIAGNOSIS — G9341 Metabolic encephalopathy: Secondary | ICD-10-CM | POA: Diagnosis not present

## 2022-08-08 DIAGNOSIS — A4102 Sepsis due to Methicillin resistant Staphylococcus aureus: Secondary | ICD-10-CM | POA: Diagnosis not present

## 2022-08-08 DIAGNOSIS — I639 Cerebral infarction, unspecified: Secondary | ICD-10-CM | POA: Diagnosis not present

## 2022-08-08 LAB — CBC WITH DIFFERENTIAL/PLATELET
Abs Immature Granulocytes: 0.16 10*3/uL — ABNORMAL HIGH (ref 0.00–0.07)
Basophils Absolute: 0.3 10*3/uL — ABNORMAL HIGH (ref 0.0–0.1)
Basophils Relative: 0 %
Eosinophils Absolute: 54.7 10*3/uL — ABNORMAL HIGH (ref 0.0–0.5)
Eosinophils Relative: 85 %
HCT: 35 % — ABNORMAL LOW (ref 36.0–46.0)
Hemoglobin: 12.1 g/dL (ref 12.0–15.0)
Immature Granulocytes: 0 %
Lymphocytes Relative: 2 %
Lymphs Abs: 1.2 10*3/uL (ref 0.7–4.0)
MCH: 32.9 pg (ref 26.0–34.0)
MCHC: 34.6 g/dL (ref 30.0–36.0)
MCV: 95.1 fL (ref 80.0–100.0)
Monocytes Absolute: 0.6 10*3/uL (ref 0.1–1.0)
Monocytes Relative: 1 %
Neutro Abs: 7.7 10*3/uL (ref 1.7–7.7)
Neutrophils Relative %: 12 %
Platelets: 208 10*3/uL (ref 150–400)
RBC: 3.68 MIL/uL — ABNORMAL LOW (ref 3.87–5.11)
RDW: 16.7 % — ABNORMAL HIGH (ref 11.5–15.5)
WBC: 64.6 10*3/uL (ref 4.0–10.5)
nRBC: 0 % (ref 0.0–0.2)

## 2022-08-08 LAB — BASIC METABOLIC PANEL
Anion gap: 13 (ref 5–15)
BUN: 14 mg/dL (ref 8–23)
CO2: 23 mmol/L (ref 22–32)
Calcium: 8 mg/dL — ABNORMAL LOW (ref 8.9–10.3)
Chloride: 103 mmol/L (ref 98–111)
Creatinine, Ser: 0.64 mg/dL (ref 0.44–1.00)
GFR, Estimated: >60 mL/min (ref >60)
Glucose, Bld: 124 mg/dL — ABNORMAL HIGH (ref 70–99)
Potassium: 3.3 mmol/L — ABNORMAL LOW (ref 3.5–5.1)
Sodium: 139 mmol/L (ref 135–145)

## 2022-08-08 LAB — URINALYSIS, COMPLETE (UACMP) WITH MICROSCOPIC
Glucose, UA: 50 mg/dL — AB
Hgb urine dipstick: NEGATIVE
Ketones, ur: 80 mg/dL — AB
Nitrite: NEGATIVE
Protein, ur: 100 mg/dL — AB
Specific Gravity, Urine: 1.042 — ABNORMAL HIGH (ref 1.005–1.030)
pH: 5 (ref 5.0–8.0)

## 2022-08-08 LAB — PHOSPHORUS: Phosphorus: 1.9 mg/dL — ABNORMAL LOW (ref 2.5–4.6)

## 2022-08-08 LAB — MAGNESIUM: Magnesium: 2.1 mg/dL (ref 1.7–2.4)

## 2022-08-08 LAB — HEPARIN LEVEL (UNFRACTIONATED): Heparin Unfractionated: 0.51 IU/mL (ref 0.30–0.70)

## 2022-08-08 MED ORDER — AMANTADINE HCL 50 MG/5ML PO SOLN
200.0000 mg | Freq: Two times a day (BID) | ORAL | Status: DC
  Administered 2022-08-08 – 2022-08-09 (×2): 200 mg via ORAL
  Filled 2022-08-08 (×3): qty 20

## 2022-08-08 MED ORDER — SODIUM CHLORIDE 0.9 % IV SOLN: INTRAVENOUS | Status: DC | PRN

## 2022-08-08 MED ORDER — EZETIMIBE 10 MG PO TABS
10.0000 mg | ORAL_TABLET | Freq: Every day | ORAL | Status: DC
  Administered 2022-08-08 – 2022-08-09 (×2): 10 mg via ORAL
  Filled 2022-08-08 (×2): qty 1

## 2022-08-08 MED ORDER — SODIUM CHLORIDE 0.9 % IV SOLN
2.0000 g | INTRAVENOUS | Status: AC
  Administered 2022-08-08: 2 g via INTRAVENOUS
  Filled 2022-08-08: qty 20

## 2022-08-08 MED ORDER — PROSOURCE TF20 ENFIT COMPATIBL EN LIQD
60.0000 mL | Freq: Every day | ENTERAL | Status: DC
  Administered 2022-08-08 – 2022-08-12 (×4): 60 mL
  Filled 2022-08-08 (×4): qty 60

## 2022-08-08 MED ORDER — JEVITY 1.5 CAL/FIBER PO LIQD
1000.0000 mL | ORAL | Status: DC
  Administered 2022-08-08 – 2022-08-10 (×2): 1000 mL
  Filled 2022-08-08 (×7): qty 1000

## 2022-08-08 MED ORDER — DEXTROSE-NACL 5-0.45 % IV SOLN: INTRAVENOUS | Status: DC

## 2022-08-08 NOTE — Progress Notes (Addendum)
Pharmacy Antibiotic Note  Sarah Walsh is a 66 y.o. female on Vancomycin, Cefepime and Metronidazole for sepsis coverage with unknown source. 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   Height: '5\' 3"'$  (160 cm) Weight: 72.6 kg (160 lb) IBW/kg (Calculated) : 52.4  Temp (24hrs), Avg:98.3 F (36.8 C), Min:97.4 F (36.3 C), Max:99.5 F (37.5 C)  Recent Labs  Lab 08/02/22 1516 08/02/22 1902 08/02/22 2230 08/03/22 0410 08/04/22 0140 08/05/22 0314 08/05/22 1029 08/05/22 1536 08/06/22 0133 08/07/22 0218 08/07/22 0618 08/08/22 0152  WBC  --   --   --    < > 48.2* 46.3*  --   --  55.1* 54.5* 63.8* 64.6*  CREATININE  --   --   --    < > 0.63 0.87  --   --  0.72  --  0.68 0.64  LATICACIDVEN 1.7 2.1* 2.0*  --   --   --   --   --   --   --   --   --   VANCOTROUGH  --   --   --   --   --   --   --  8*  --   --   --   --   VANCOPEAK  --   --   --   --   --   --  12*  --   --   --   --   --    < > = values in this interval not displayed.     Estimated Creatinine Clearance: 64.3 mL/min (by C-G formula based on SCr of 0.64 mg/dL).    Allergies  Allergen Reactions   Statins Other (See Comments)    myalgia Other reaction(s): Other (See Comments) Other Reaction(s): Other  myalgia Other Reaction(s): Other  myalgia    Codeine Itching and Other (See Comments)    Antimicrobials this admission: Vancomycin 10/10>> Cefepime 10/10 >> Metronidazole 10/10 >>  Dose adjustments this admission: 10/13:  Vanc 750 mg > 1gm IV q12h based on levels  Microbiology results: 10/10 blood: neg 10/10 urine: E coli - pansensitive  Hildred Laser, PharmD Clinical Pharmacist **Pharmacist phone directory can now be found on [email or web address] ([email or web address]).  Listed under [facility].

## 2022-08-08 NOTE — Evaluation (Signed)
Occupational Therapy Evaluation Patient Details Name: Sarah Walsh MRN: [id number] DOB: July 17, 1956 Today's Date: 08/08/2022   History of Present Illness 66 y.o. female presents to [facility] on 08[facility]02/2022 with lethargy, confusion, anorexia, gait instability. Pt recently diagnosed with T cell lymphoma at [facility]. MRI demonstrates numerous diffuse bilateral foci of abnormal diffusion restriction, suggestive of embolic shower. Also with new L lower extremity DVT. Repeat MRI due to worsening level of consciousness showed extension of embolic shower strokes, involving bil cerebral and cerebellar hemispheres. PMH includes HTN, HLP, DMII, T cell lymphoma   Clinical Impression   Sarah Walsh was evaluated s/p the above admission list, per her daughter pt is typically indep at baseline. She lives with a "roommate" who is available to assist PRN. Upon evaluation pt has functional limitations due to impaired communication, BUE flaccidity, BLE weakness and limited ROM, decreased activity tolerance and poor balance. Overall she requires total A +2 fro all aspects of her care. OT to follow acutely. Recommend d/c to AIR in anticipation pt progresses acutely.      Recommendations for follow up therapy are one component of a multi-disciplinary discharge planning process, led by the attending physician.  Recommendations may be updated based on patient status, additional functional criteria and insurance authorization.   Follow Up Recommendations  Acute inpatient rehab (3hours/day)    Assistance Recommended at Discharge Frequent or constant Supervision/Assistance  Patient can return home with the following A lot of help with walking and/or transfers;Two people to help with walking and/or transfers;A lot of help with bathing/dressing/bathroom;Two people to help with bathing/dressing/bathroom;Assist for transportation;Help with stairs or ramp for entrance    Functional Status Assessment  Patient has had a recent  decline in their functional status and demonstrates the ability to make significant improvements in function in a reasonable and predictable amount of time.  Equipment Recommendations  Other (comment) (defer)    Recommendations for Other Services Rehab consult     Precautions / Restrictions Precautions Precautions: Fall Precaution Comments: Purwick, IV, NG tube for cortrak Required Braces or Orthoses:  (requested prevalons) Restrictions Weight Bearing Restrictions: No      Mobility Bed Mobility Overal bed mobility: Needs Assistance Bed Mobility: Supine to Sit, Sit to Supine, Rolling Rolling: Total assist   Supine to sit: Total assist, HOB elevated Sit to supine: Total assist, HOB elevated        Transfers Overall transfer level: Needs assistance                 General transfer comment: deferred due to low level of arousal and no command following      Balance Overall balance assessment: Needs assistance Sitting-balance support: Bilateral upper extremity supported, Feet supported Sitting balance-Leahy Scale: Zero         Standing balance comment: deferred                           ADL either performed or assessed with clinical judgement   ADL Overall ADL's : Needs assistance/impaired                                       General ADL Comments: total A for all aspects     Vision Baseline Vision/History: 1 Wears glasses Ability to See in Adequate Light: 1 Impaired Vision Assessment?: Vision impaired- to be further tested in functional context Additional Comments: pt  not tracking to visual stim, did not blink to threat     Perception     Praxis      Pertinent Vitals/Pain Pain Assessment Pain Assessment: Faces Faces Pain Scale: Hurts a little bit Pain Location: jostling lines Pain Descriptors / Indicators: Guarding Pain Intervention(s): Limited activity within patient's tolerance, Monitored during session     Hand  Dominance Right   Extremity/Trunk Assessment Upper Extremity Assessment Upper Extremity Assessment: RUE deficits/detail;LUE deficits/detail RUE Deficits / Details: flaccid, positive withdrawl to noxious. slight sublux noted RUE Sensation: decreased light touch;decreased proprioception RUE Coordination: decreased fine motor;decreased gross motor LUE Deficits / Details: flaccid, positive withdrawl to noxious. slight sublux noted LUE Sensation: decreased light touch LUE Coordination: decreased gross motor;decreased fine motor   Lower Extremity Assessment Lower Extremity Assessment: Defer to PT evaluation   Cervical / Trunk Assessment Cervical / Trunk Assessment: Kyphotic   Communication Communication Communication: Receptive difficulties;Expressive difficulties   Cognition Arousal/Alertness: Lethargic Behavior During Therapy: Flat affect Overall Cognitive Status: Difficult to assess                                 General Comments: impaired communication. Initially siggling toes for yes/no. inconsistent with technique once sitting EOB. Pt blinking eyes to command     General Comments  VSS on 2L, daughter present and assisting    Exercises     Shoulder Instructions      Home Living Family/patient expects to be discharged to:: Private residence Living Arrangements: Spouse/significant other Available Help at Discharge: Family;Available PRN/intermittently Type of Home: Apartment Home Access: Level entry     Home Layout: One level     Bathroom Shower/Tub: Teacher, early years/pre: Standard Bathroom Accessibility: Yes   Home Equipment: BSC/3in1;Toilet riser   Additional Comments: obtained from daughter. pt lives with a "roommate" who is not available 24/7. dgtr lives 15 minutes away  Lives With: Other (Comment)    Prior Functioning/Environment Prior Level of Function : Independent/Modified Independent;Driving             Mobility Comments:  No AD          OT Problem List: Decreased strength;Decreased range of motion;Decreased activity tolerance;Impaired balance (sitting and/or standing);Decreased cognition;Decreased safety awareness;Decreased knowledge of use of DME or AE;Decreased coordination;Impaired vision/perception;Decreased knowledge of precautions;Impaired sensation;Impaired UE functional use      OT Treatment/Interventions: Self-care/ADL training;Therapeutic exercise;DME and/or AE instruction;Therapeutic activities;Patient/family education;Balance training    OT Goals(Current goals can be found in the care plan section) Acute Rehab OT Goals Patient Stated Goal: unable to state OT Goal Formulation: Patient unable to participate in goal setting Time For Goal Achievement: 08/22/22 Potential to Achieve Goals: Good ADL Goals Pt Will Perform Grooming: with max assist;sitting Pt Will Perform Upper Body Dressing: with max assist;sitting Pt Will Perform Lower Body Dressing: with max assist;sit to/from stand Pt Will Transfer to Toilet: with max assist;stand pivot transfer;bedside commode  OT Frequency: Min 2X/week    Co-evaluation PT/OT/SLP Co-Evaluation/Treatment: Yes Reason for Co-Treatment: Complexity of the patient's impairments (multi-system involvement) PT goals addressed during session: Mobility/safety with mobility;Proper use of DME;Strengthening/ROM OT goals addressed during session: ADL's and self-care      AM-PAC OT "6 Clicks" Daily Activity     Outcome Measure Help from another person eating meals?: Total Help from another person taking care of personal grooming?: Total Help from another person toileting, which includes using toliet, bedpan, or urinal?: Total  Help from another person bathing (including washing, rinsing, drying)?: Total Help from another person to put on and taking off regular upper body clothing?: Total Help from another person to put on and taking off regular lower body clothing?:  Total 6 Click Score: 6   End of Session Equipment Utilized During Treatment: Gait belt;Oxygen Nurse Communication: Mobility status  Activity Tolerance: Patient tolerated treatment well Patient left: in bed;with call bell/phone within reach;with family/visitor present  OT Visit Diagnosis: Unsteadiness on feet (R26.81);Other abnormalities of gait and mobility (R26.89);Muscle weakness (generalized) (M62.81);Hemiplegia and hemiparesis Hemiplegia - caused by: Cerebral infarction                Time: 1420-1500 OT Time Calculation (min): 40 min Charges:  OT General Charges $OT Visit: 1 Visit OT Evaluation $OT Eval Moderate Complexity: 1 Mod    Nina Hoar D Causey 08/08/2022, 3:28 PM

## 2022-08-08 NOTE — TOC CAGE-AID Note (Signed)
Transition of Care [facility]) - CAGE-AID Screening   Patient Details  Name: Sarah Walsh MRN: [id number] Date of Birth: 09/04/1956  [facility] CM/SW Contact:    Coralee Pesa, Lilly Phone Number: 08/08/2022, 9:13 AM   Clinical Narrative: Per chart review, pt is disoriented and not appropriate for CAGE- AID assessment.   CAGE-AID Screening: Substance Abuse Screening unable to be completed due to: : Patient unable to participate             Substance Abuse Education Offered: No

## 2022-08-08 NOTE — Progress Notes (Signed)
Initial Nutrition Assessment  DOCUMENTATION CODES:   Non-severe (moderate) malnutrition in context of acute illness/injury  INTERVENTION:   Tube Feeds via Cortrak: - Start Jevity 1.5 at 25 mL/hr and advance by 10 mL q8h to goal rate of 55 mL/hr  - ProSource TF20 - daily - 170 mL free water flush q4h - Provides 2060 kcal, 104 gm protein, and 2023 mL total free water daily.    Monitor magnesium, potassium, and phosphorus BID for at least 3 days, MD to replete as needed, as pt is at risk for refeeding syndrome given malnutrition and poor PO >7 days.  NUTRITION DIAGNOSIS:   Moderate Malnutrition related to acute illness as evidenced by mild fat depletion, moderate muscle depletion.  GOAL:   Patient will meet greater than or equal to 90% of their needs  MONITOR:   Diet advancement, Labs, TF tolerance, Skin, I & O's  REASON FOR ASSESSMENT:   Consult Enteral/tube feeding initiation and management  ASSESSMENT:   66 y.o. female presented to the ED with lethargy, confusion, anorexia, and balance difficulty. PM includes T2DM, HTN, and new lymphoma undergoing work-up for it. Pt admitted with SIRS, acute encephalopathy, and acute CVA.   10/10 - admitted 10/16 - Cortrak placed (tip gastric)  Daughter reports that pt has ate almost nothing in the past week. States that she was not eating that well either prior to the past week. Reports that she was drinking premier protein in the morning with her coffee, but has not been doing that in a few weeks. Family endorses significant amount of weight loss as well. States that between two outpatient appointments, pt lost between 6-10#. Unable to compare weight history due to lack of weights in EMR.   Medications reviewed and include: IV antibiotics, IV Heparin  Labs reviewed: Potassium 3.3   NUTRITION - FOCUSED PHYSICAL EXAM:  Flowsheet Row Most Recent Value  Orbital Region Mild depletion  Upper Arm Region Mild depletion  Thoracic and Lumbar  Region No depletion  Buccal Region Mild depletion  Temple Region No depletion  Clavicle Bone Region Mild depletion  Clavicle and Acromion Bone Region Mild depletion  Scapular Bone Region Mild depletion  Dorsal Hand Unable to assess  Patellar Region Moderate depletion  Anterior Thigh Region Moderate depletion  Posterior Calf Region Moderate depletion  Edema (RD Assessment) None  Hair Reviewed  Eyes Unable to assess  Mouth Unable to assess  Skin Reviewed  Nails Unable to assess   Diet Order:   Diet Order     None       EDUCATION NEEDS:   Not appropriate for education at this time  Skin:  Skin Assessment: Reviewed RN Assessment  Last BM:  10/12  Height:   Ht Readings from Last 1 Encounters:  08/02/22 '5\' 3"'$  (1.6 m)    Weight:   Wt Readings from Last 1 Encounters:  08/02/22 72.6 kg    Ideal Body Weight:  52.3 kg  BMI:  Body mass index is 28.34 kg/m.  Estimated Nutritional Needs:   Kcal:  2000-2200  Protein:  100-115 grams  Fluid:  >/= 2 L    Hermina Barters RD, LDN Clinical Dietitian See [facility] for contact information.

## 2022-08-08 NOTE — Procedures (Signed)
Cortrak  Person Inserting Tube:  Orla Estrin L, RD Tube Type:  Cortrak - 43 inches Tube Size:  10 Tube Location:  Left nare Secured by: Bridle Technique Used to Measure Tube Placement:  Marking at nare/corner of mouth Cortrak Secured At:  61 cm   Cortrak Tube Team Note:  Consult received to place a Cortrak feeding tube.   X-ray is required, abdominal x-ray has been ordered by the Cortrak team. Please confirm tube placement before using the Cortrak tube.   If the tube becomes dislodged please keep the tube and contact the Cortrak team at [email or web address] for replacement.  If after hours and replacement cannot be delayed, place a NG tube and confirm placement with an abdominal x-ray.    Tayana Shankle RD, LDN Clinical Dietitian See AMi[facility] for contact information.    

## 2022-08-08 NOTE — Progress Notes (Signed)
Hematology/Oncology Progress Note  Clinical Summary: [name] is a 66 year old female with medical history significant for newly diagnosed T-cell lymphoma who is currently admitted with new multiple embolic strokes and LLE VTE.   Interval History: --repeat MRI on 08/05/2022 showed increase in extent of watershed ischemia from prior MRI 3 days before --patient not conversant, family at bedside. --discussed the blood clot, CVA, and T cell lymphoma diagnosis with the patient's family. Questions and concerns were addressed.  --TEE performed on 08/05/2022 with no clear etiology for emboli   O:  Vitals:   08/08/22 0427 08/08/22 0821  BP: 133/87 127/78  Pulse: 98 93  Resp: 20 (!) 23  Temp: 99 F (37.2 C) 97.7 F (36.5 C)  SpO2: 96% 93%      Latest Ref Rng & Units 08/08/2022    1:52 AM 08/07/2022    6:18 AM 08/06/2022    1:33 AM  CMP  Glucose 70 - 99 mg/dL 124  153  144   BUN 8 - 23 mg/dL '14  14  11   '$ Creatinine 0.44 - 1.00 mg/dL 0.64  0.68  0.72   Sodium 135 - 145 mmol/L 139  136  136   Potassium 3.5 - 5.1 mmol/L 3.3  3.2  3.2   Chloride 98 - 111 mmol/L 103  104  107   CO2 22 - 32 mmol/L '23  23  18   '$ Calcium 8.9 - 10.3 mg/dL 8.0  7.8  7.9       Latest Ref Rng & Units 08/08/2022    1:52 AM 08/07/2022    6:18 AM 08/07/2022    2:18 AM  CBC  WBC 4.0 - 10.5 K/uL 64.6  63.8  54.5   Hemoglobin 12.0 - 15.0 g/dL 12.1  12.9  10.9   Hematocrit 36.0 - 46.0 % 35.0  36.2  30.8   Platelets 150 - 400 K/uL 208  208  181       GENERAL: chronically ill appearing elderly somnolent Caucasian female in NAD  SKIN: skin color, texture, turgor are normal, no rashes or significant lesions EYES: conjunctiva are pink and non-injected, sclera clear LUNGS: clear to auscultation and percussion with normal breathing effort HEART: regular rate & rhythm and no murmurs and no lower extremity edema Musculoskeletal: no cyanosis of digits and no clubbing  PSYCH: somnolent and not conversant.    Assessment/Plan:  [name] is a 66 year old female with medical history significant for newly diagnosed T-cell lymphoma who is currently admitted with new multiple embolic strokes and LLE VTE.    # Newly Diagnosed Lymphoma -- Patient has establish care with [facility] oncology and is intended to start treatment with them. --Based on the outside pathology report findings are consistent with lymphoma, though the subtype of lymphoma is not clear based on our review of the outside reports.  Reportedly this is a T-cell lymphoma. -- At this time it is unclear if her current embolic showering in the brain and lower extremity DVT are related to her current diagnosis of lymphoma. -- Patient's fevers may be related to tumor fever, though at this time do agree with empiric antibiotic therapy and collecting of culture data. --Given her weakened state she does not appear to be in good condition for starting chemotherapy at this time.  Hopefully she will make substantial recoveries in her functional status and be able to start treatment as planned. Plan:  --No clear indication for pulse steroids at this time as there are no  compressive issues or clear urgent indications for the treatment of the lymphoma --discussed our findings and the nature of T cell lymphoma with the family on 08/07/2022.  --given her continued decline and worsening functional status Palliative care consult would be reasonable.  --I am concerned she may never make a substantial enough recovery from the strokes to tolerate chemotherapy treatment.  -- At time of discharge please assure patient has follow-up with [facility] oncology   # Embolic Strokes # LLE DVT -- Appreciate consult to neurology.  Anticoagulation per their recommendation.   Sarah Peoples, MD Department of Hematology/Oncology [place] at [facility] Phone: [phone] Pager: [phone] Email: [email or web address]

## 2022-08-08 NOTE — Progress Notes (Signed)
Progress Note  Patient: Sarah Walsh YOV:[id number] DOB: 06-04-1956  DOA: 08/02/2022  DOS: 08/08/2022    Brief hospital course: NAYDELINE MORACE is a 66 y.o. female with a history of T2DM, HLD, HTN, recent cervical LN biopsy with +lymphoma reportedly T cell, not yet started on treatment, presented to ED 10/10 with lethargy, confusion, anorexia, gait instability. MRI brain was positive for scattered cortical and subcortical infarcts. She was admitted, also noted to have fevers, WBC 74.1O with eosinophilic predominance.   Repeat MRI due to worsening level of consciousness showed extension of embolic shower strokes. EEG nonspecific without seizure recorded. Broad IV antibiotics have been continues empirically though no nidus of infection has been identified. Goals of care discussions are ongoing.   Assessment and Plan: Sepsis: Patient met sepsis criteria on admission with tachycardia, fevers, leukocytosis, lactic acidosis, source unclear.   - Follow blood cultures. Note PCT is undetectable x2. DDx fever includes due to lymphoma, central, less likely DVT in and of itself. - With negative cultures, will complete ceftriaxone 2g q24h, vancomycin, flagyl x7 days today. - Fungitell still pending - Schedule tylenol   Acute metabolic encephalopathy: Most likely related to widespread embolic CVAs.  - EEG with nonspecific changes without seizure activity noted initially. EEG repeated and unchanged 10/15. Anticipate some waxing/waning.   Acute hypoxic respiratory failure: With opacities on CT, we've been covering w/broad abx which would cover pneumonia. CXR read as increasing volume but exam remains stable and we are continuing IVF to match insensible losses. VBG without respiratory acidosis.  - Support with supplemental oxygen.  - She's maintaining airway at this time. Is currently full code, though palliative care discussions are ongoing.   Extensive bilateral cortical and subcortical ischemic CVAs: MRI  brain showed numerous diffuse bilateral foci of abnormal diffusion restriction in a pattern most suggestive of embolic shower but could also indicate watershed ischemia. Due to worsening lethargy and exam, repeat MRI ordered today and confirms worsening severity of infarct burden.  - PFO found on TEE. In conjunction with know DVT (albeit distal) and embolic distribution, started heparin, goal level 0.3-0.5.  - Neuro checks to continue - We are pursing palliative care discussions with the family daily. Since prognosis is not defined, plan moving forward cannot be defined. This patient's family is very reasonable and agrees to plan to start cortrack feeding for nutritional support and continue supportive measures while monitoring any neurological recovery that we do get. Based on that, we will discuss what the patient would have wanted given where she's at functionally/cognitively.  Dark urine:  - Urinalysis. Pt is on broad antibiotics, doubt infectious etiology.  - SpGrav elevated, Augment and adjust IVF accordingly. Ketones present, can add dextrose to IVF and will start cortrak soon.  - Bilirubinuria is likely etiology which is in turn likely due to lymphoma.   Metabolic acidosis: No anion gap. Resolved.  Hypokalemia:  - Supplement in IVF and continue monitoring.  PSVT: Burden decreased on repeat tele review today.  - Continue scheduled low dose IV metoprolol for now.  BP tolerating.   HTN:  - BP goal per neurology   Acute RLE peroneal DVT - CTA negative for PE.  - Heparin as discussed above.  - Interestingly, per my discussion with heme/onc, Dr. Lorenso Courier, lymphomas are not classically associated with hypercoagulability. The severity of leukocytosis is also not near the expected threshold to cause leukostasis.    T-cell lymphoma: New diagnosis, has not been started on treatment yet.   - Oncology,  Dr. Lorenso Courier consulted. Of course the patient is not currently a candidate for treatment and,  given her normal cell counts otherwise, doubt there'd be any benefit to pulsed steroids at this time. There would certainly be risks as well. The family is aware of this discussion. - There is no further work up or treatment currently planned as an inpatient for this issue.    Eosinophilic leukocytosis:  - I've got to think this is related to her T cell lymphoma. Note subtypes of T cell lymphomas can aberrantly express IL-5.  - Work up has not suggested nephritis, hepatitis, She has no history of this prior to diagnosis, no ongoing drug/allergic reaction, no parasitic infection detected. Could be expected at the time of her drug eruption after Tx for MRSA recently though that agent was stopped and rash has resolved durably. HIV NR - Will plan to monitor CBC w/differential. Note stable eosinophilia, with normal PMNs today.  Subjective: Seems to have made some gains in past 24 hours! Follows commands appropriately to move feet and then again to stop moving feet. Seemed to actually look at, not through, family members. Smiled on her son's entrance today. No changes in respiratory status overnight. Renal function stable.   Objective: Vitals:   08/08/22 0427 08/08/22 0821 08/08/22 1127 08/08/22 1642  BP: 133/87 127/78 136/80 125/74  Pulse: 98 93 92 94  Resp: 20 (!) 23 (!) 22 18  Temp: 99 F (37.2 C) 97.7 F (36.5 C) 97.7 F (36.5 C) 97.7 F (36.5 C)  TempSrc: Axillary Oral Axillary Oral  SpO2: 96% 93% 93% 93%  Weight:      Height:       Gen: 66 y.o. female in no distress Pulm: Nonlabored breathing room air. Clear laterally. CV: Regular rate and rhythm. No murmur, rub, or gallop. No JVD, no pitting dependent edema. GI: Abdomen soft, non-tender, non-distended, with normoactive bowel sounds.  Ext: Warm, no deformities Skin: No rashes, lesions or ulcers on visualized skin. Neuro: Opens eyes spontaneously and to voice and to touch. Will move feet on command but not other or more proximal  extremities. Did not vocalize for me, but has been doing so intermittently more. PERRL, does track somewhat.  Urine is very dark and cloudy.  Data Personally reviewed: CBC: Recent Labs  Lab 08/02/22 1400 08/02/22 1902 08/05/22 0314 08/06/22 0133 08/07/22 0218 08/07/22 0618 08/08/22 0152  WBC 54.5*   < > 46.3* 55.1* 54.5* 63.8* 64.6*  NEUTROABS 5.8  --   --   --   --  13.4* 7.7  HGB 13.6   < > 13.1 13.1 10.9* 12.9 12.1  HCT 39.7   < > 39.1 37.5 30.8* 36.2 35.0*  MCV 94.3   < > 93.3 93.5 93.3 93.3 95.1  PLT 296   < > 213 196 181 208 208   < > = values in this interval not displayed.   Basic Metabolic Panel: Recent Labs  Lab 08/02/22 1902 08/03/22 0410 08/04/22 0140 08/05/22 0314 08/06/22 0133 08/07/22 0618 08/08/22 0152  NA  --  134* 133* 133* 136 136 139  K  --  3.4* 3.2* 3.2* 3.2* 3.2* 3.3*  CL  --  100 101 104 107 104 103  CO2  --  23 20* 15* 18* 23 23  GLUCOSE  --  136* 125* 157* 144* 153* 124*  BUN  --  7* $R'8 8 11 14 14  'az$ CREATININE  --  0.66 0.63 0.87 0.72 0.68 0.64  CALCIUM  --  8.2* 8.1* 8.0* 7.9* 7.8* 8.0*  MG  --  1.9  --   --   --   --   --   PHOS 2.6  --   --   --   --   --   --    GFR: Estimated Creatinine Clearance: 64.3 mL/min (by C-G formula based on SCr of 0.64 mg/dL). Liver Function Tests: Recent Labs  Lab 08/02/22 1351  AST 22  ALT 14  ALKPHOS 118  BILITOT 0.9  PROT 7.5  ALBUMIN 3.2*   No results for input(s): "LIPASE", "AMYLASE" in the last 168 hours. Recent Labs  Lab 08/02/22 1902  AMMONIA 29   Coagulation Profile: Recent Labs  Lab 08/02/22 1902  INR 1.3*  1.2   Cardiac Enzymes: No results for input(s): "CKTOTAL", "CKMB", "CKMBINDEX", "TROPONINI" in the last 168 hours. BNP (last 3 results) No results for input(s): "PROBNP" in the last 8760 hours. HbA1C: No results for input(s): "HGBA1C" in the last 72 hours. CBG: Recent Labs  Lab 08/02/22 1506  GLUCAP 149*   Lipid Profile: No results for input(s): "CHOL", "HDL",  "LDLCALC", "TRIG", "CHOLHDL", "LDLDIRECT" in the last 72 hours. Thyroid Function Tests: No results for input(s): "TSH", "T4TOTAL", "FREET4", "T3FREE", "THYROIDAB" in the last 72 hours. Anemia Panel: No results for input(s): "VITAMINB12", "FOLATE", "FERRITIN", "TIBC", "IRON", "RETICCTPCT" in the last 72 hours. Urine analysis:    Component Value Date/Time   COLORURINE AMBER (A) 08/08/2022 1415   APPEARANCEUR HAZY (A) 08/08/2022 1415   LABSPEC 1.042 (H) 08/08/2022 1415   PHURINE 5.0 08/08/2022 1415   GLUCOSEU 50 (A) 08/08/2022 1415   HGBUR NEGATIVE 08/08/2022 1415   BILIRUBINUR MODERATE (A) 08/08/2022 1415   KETONESUR 80 (A) 08/08/2022 1415   PROTEINUR 100 (A) 08/08/2022 1415   NITRITE NEGATIVE 08/08/2022 1415   LEUKOCYTESUR TRACE (A) 08/08/2022 1415   Recent Results (from the past 240 hour(s))  Resp Panel by RT-PCR (Flu A&B, Covid) Anterior Nasal Swab     Status: None   Collection Time: 08/02/22  3:10 PM   Specimen: Anterior Nasal Swab  Result Value Ref Range Status   SARS Coronavirus 2 by RT PCR NEGATIVE NEGATIVE Final    Comment: (NOTE) SARS-CoV-2 target nucleic acids are NOT DETECTED.  The SARS-CoV-2 RNA is generally detectable in upper respiratory specimens during the acute phase of infection. The lowest concentration of SARS-CoV-2 viral copies this assay can detect is 138 copies/mL. A negative result does not preclude SARS-Cov-2 infection and should not be used as the sole basis for treatment or other patient management decisions. A negative result may occur with  improper specimen collection/handling, submission of specimen other than nasopharyngeal swab, presence of viral mutation(s) within the areas targeted by this assay, and inadequate number of viral copies(<138 copies/mL). A negative result must be combined with clinical observations, patient history, and epidemiological information. The expected result is Negative.  Fact Sheet for Patients:   EntrepreneurPulse.com.au  Fact Sheet for Healthcare Providers:  IncredibleEmployment.be  This test is no t yet approved or cleared by the [place] FDA and  has been authorized for detection and/or diagnosis of SARS-CoV-2 by FDA under an Emergency Use Authorization (EUA). This EUA will remain  in effect (meaning this test can be used) for the duration of the COVID-19 declaration under Section 564(b)(1) of the Act, 21 U.S.C.section 360bbb-3(b)(1), unless the authorization is terminated  or revoked sooner.       Influenza A by PCR NEGATIVE NEGATIVE Final   Influenza B  by PCR NEGATIVE NEGATIVE Final    Comment: (NOTE) The Xpert Xpress SARS-CoV-2/FLU/RSV plus assay is intended as an aid in the diagnosis of influenza from Nasopharyngeal swab specimens and should not be used as a sole basis for treatment. Nasal washings and aspirates are unacceptable for Xpert Xpress SARS-CoV-2/FLU/RSV testing.  Fact Sheet for Patients: EntrepreneurPulse.com.au  Fact Sheet for Healthcare Providers: IncredibleEmployment.be  This test is not yet approved or cleared by the [place] FDA and has been authorized for detection and/or diagnosis of SARS-CoV-2 by FDA under an Emergency Use Authorization (EUA). This EUA will remain in effect (meaning this test can be used) for the duration of the COVID-19 declaration under Section 564(b)(1) of the Act, 21 U.S.C. section 360bbb-3(b)(1), unless the authorization is terminated or revoked.  Performed at [facility], [place] [address] [place], [place] [address]   Blood culture (routine x 2)     Status: None   Collection Time: 08/02/22  3:16 PM   Specimen: BLOOD  Result Value Ref Range Status   Specimen Description BLOOD LEFT ANTECUBITAL  Final   Special Requests   Final    BOTTLES DRAWN AEROBIC AND ANAEROBIC Blood Culture results may not be optimal due to an inadequate  volume of blood received in culture bottles   Culture   Final    NO GROWTH 5 DAYS Performed at [facility], [place] [address] [place], [place] [address]    Report Status 08/07/2022 FINAL  Final  Blood culture (routine x 2)     Status: None   Collection Time: 08/02/22  7:02 PM   Specimen: BLOOD  Result Value Ref Range Status   Specimen Description BLOOD LEFT ANTECUBITAL  Final   Special Requests   Final    BOTTLES DRAWN AEROBIC AND ANAEROBIC Blood Culture adequate volume   Culture   Final    NO GROWTH 5 DAYS Performed at [facility], [place] [address] [place], [place] [address]    Report Status 08/07/2022 FINAL  Final  Urine Culture     Status: Abnormal   Collection Time: 08/02/22  9:37 PM   Specimen: Urine, Clean Catch  Result Value Ref Range Status   Specimen Description URINE, CLEAN CATCH  Final   Special Requests   Final    NONE Performed at [facility], [place] [address], [place], [place] [address]    Culture 10,000 COLONIES/mL ESCHERICHIA COLI (A)  Final   Report Status 08/05/2022 FINAL  Final   Organism ID, Bacteria ESCHERICHIA COLI (A)  Final      Susceptibility   Escherichia coli - MIC*    AMPICILLIN 8 SENSITIVE Sensitive     CEFAZOLIN <=4 SENSITIVE Sensitive     CEFEPIME <=0.12 SENSITIVE Sensitive     CEFTRIAXONE <=0.25 SENSITIVE Sensitive     CIPROFLOXACIN <=0.25 SENSITIVE Sensitive     GENTAMICIN <=1 SENSITIVE Sensitive     IMIPENEM <=0.25 SENSITIVE Sensitive     NITROFURANTOIN <=16 SENSITIVE Sensitive     TRIMETH/SULFA <=20 SENSITIVE Sensitive     AMPICILLIN/SULBACTAM 4 SENSITIVE Sensitive     PIP/TAZO <=4 SENSITIVE Sensitive     * 10,000 COLONIES/mL ESCHERICHIA COLI     DG Abd Portable 1V  Result Date: 08/08/2022 CLINICAL DATA:  Feeding tube placement EXAM: PORTABLE ABDOMEN - 1 VIEW COMPARISON:  CT abdomen 05/29/2006 FINDINGS: Partially visualized bilateral pulmonary nodules. Bilateral hilar lymphadenopathy. Feeding tube with the tip  projecting over the antrum of the stomach. No  bowel dilatation to suggest obstruction. No evidence of pneumoperitoneum, portal venous gas or pneumatosis. No pathologic calcifications along the expected course of the ureters. No acute osseous abnormality. IMPRESSION: 1. Feeding tube with the tip projecting over the antrum of the stomach. 2. Redemonstrated bilateral hilar lymphadenopathy and bilateral pulmonary nodules. Electronically Signed   By: Kathreen Devoid M.D.   On: 08/08/2022 13:00   EEG adult  Result Date: 08/07/2022 Derek Jack, MD     08/07/2022 12:34 PM [name] MRN: [id number] Epilepsy Attending: Su Monks MD Referring Physician/Provider: Donnetta Simpers, MD Date: 08/07/2022 Duration: 24.09 min  Patient history: 66 year old female with alteration in mental status. EEG to evaluate for seizure  Level of alertness:  lethargic  AEDs during EEG study: None  Technical aspects: This EEG study was done with scalp electrodes positioned according to the 10-20 International system of electrode placement. Electrical activity was reviewed with band pass filter of 1-$RemoveBef'70Hz'iwNcsgzEtl$ , sensitivity of 7 uV/mm, display speed of 25mm/sec with a $Remo'60Hz'lxQJF$  notched filter applied as appropriate. EEG data were recorded continuously and digitally stored.  Video monitoring was available and reviewed as appropriate.  Description: EEG showed continuous generalized 3 to 6 Hz theta-delta slowing. Generalized periodic discharges with triphasic morphology at  1.5-$Remov'2Hz'eciTAs$  were also noted, more prominent when awake/stimulated. Hyperventilation and photic stimulation were not performed.    ABNORMALITY - Periodic discharges with triphasic morphology, generalized ( GPDs) - Continuous slow, generalized  IMPRESSION: This study showed generalized periodic discharges with triphasic morphology which can be on the ictal-interictal continuum. However, the morphology, frequency and reactivity to stimulation is more commonly indicative  of toxic-metabolic causes. Additionally there is moderate to severe diffuse encephalopathy, nonspecific etiology. No seizures were seen throughout the recording. This EEG is unchanged compared to yesterday. Su Monks, MD [facility] [phone] If 7pm- 7am, please page neurology on call as listed in [place].    Family Communication: Daughter, Son at bedside  Disposition: Status is: Inpatient Remains inpatient appropriate because: Critical illness Planned Discharge Destination:  TBD, guarded prognosis  Patrecia Pour, MD 08/08/2022 6:04 PM Page by [email or web address]

## 2022-08-08 NOTE — Progress Notes (Signed)
ANTICOAGULATION CONSULT NOTE - Follow Up Consult  Pharmacy Consult for heparin Indication:  multiple embolic strokes with paradoxical embolism and DVT  Labs: Recent Labs    08/06/22 0133 08/06/22 0944 08/06/22 1751 08/07/22 0218 08/07/22 0618 08/08/22 0152  HGB 13.1  --   --  10.9* 12.9 12.1  HCT 37.5  --   --  30.8* 36.2 35.0*  PLT 196  --   --  181 208 208  HEPARINUNFRC 0.12*   < > 0.33 0.35  --  0.51  CREATININE 0.72  --   --   --  0.68 0.64   < > = values in this interval not displayed.     Assessment: 66yo female subtherapeutic on heparin with initial cautious dosing for paradoxical embolism and DVT in setting of multiple embolic strokes.   Heparin level returned at 0.33, at goal.   Goal of Therapy:  Heparin level 0.3-0.5 units/ml per neurology   Plan:  Continue heparin at 1500 units/hr Monitor CBC daily with heparin level Will follow plans for oral anticoagulation  Hildred Laser, PharmD Clinical Pharmacist **Pharmacist phone directory can now be found on [email or web address] ([email or web address]).  Listed under [place].

## 2022-08-08 NOTE — Progress Notes (Signed)
STROKE TEAM PROGRESS NOTE   SUBJECTIVE (INTERVAL HISTORY) Her daughter and dietitian are at the bedside.  Pt is getting cortrak placed during round. Pt was able to say "ooch" during the placement. Daughter said pt was saying "OK" "no" intermittently responding to conversation but she did not say anything to me. Her eyes open, seems more awake alert but still not tracking or following commands. PT/OT recommend CIR. Put on amantadine to help for mental status.    OBJECTIVE Temp:  [97.4 F (36.3 C)-99 F (37.2 C)] 97.7 F (36.5 C) (10/16 1642) Pulse Rate:  [92-99] 94 (10/16 1642) Cardiac Rhythm: Normal sinus rhythm (10/16 0817) Resp:  [18-23] 18 (10/16 1642) BP: (122-138)/(74-87) 125/74 (10/16 1642) SpO2:  [93 %-96 %] 93 % (10/16 1642)  Recent Labs  Lab 08/02/22 1506  GLUCAP 149*   Recent Labs  Lab 08/02/22 1902 08/03/22 0410 08/04/22 0140 08/05/22 0314 08/06/22 0133 08/07/22 0618 08/08/22 0152  NA  --  134* 133* 133* 136 136 139  K  --  3.4* 3.2* 3.2* 3.2* 3.2* 3.3*  CL  --  100 101 104 107 104 103  CO2  --  23 20* 15* 18* 23 23  GLUCOSE  --  136* 125* 157* 144* 153* 124*  BUN  --  7* '8 8 11 14 14  '$ CREATININE  --  0.66 0.63 0.87 0.72 0.68 0.64  CALCIUM  --  8.2* 8.1* 8.0* 7.9* 7.8* 8.0*  MG  --  1.9  --   --   --   --   --   PHOS 2.6  --   --   --   --   --   --    Recent Labs  Lab 08/02/22 1351  AST 22  ALT 14  ALKPHOS 118  BILITOT 0.9  PROT 7.5  ALBUMIN 3.2*   Recent Labs  Lab 08/02/22 1400 08/02/22 1902 08/05/22 0314 08/06/22 0133 08/07/22 0218 08/07/22 0618 08/08/22 0152  WBC 54.5*   < > 46.3* 55.1* 54.5* 63.8* 64.6*  NEUTROABS 5.8  --   --   --   --  13.4* 7.7  HGB 13.6   < > 13.1 13.1 10.9* 12.9 12.1  HCT 39.7   < > 39.1 37.5 30.8* 36.2 35.0*  MCV 94.3   < > 93.3 93.5 93.3 93.3 95.1  PLT 296   < > 213 196 181 208 208   < > = values in this interval not displayed.   No results for input(s): "CKTOTAL", "CKMB", "CKMBINDEX", "TROPONINI" in the last  168 hours. No results for input(s): "LABPROT", "INR" in the last 72 hours. Recent Labs    08/08/22 1415  COLORURINE AMBER*  LABSPEC 1.042*  PHURINE 5.0  GLUCOSEU 50*  HGBUR NEGATIVE  BILIRUBINUR MODERATE*  KETONESUR 80*  PROTEINUR 100*  NITRITE NEGATIVE  LEUKOCYTESUR TRACE*       Component Value Date/Time   CHOL 127 08/03/2022 0905   TRIG 97 08/03/2022 0905   HDL 17 (L) 08/03/2022 0905   CHOLHDL 7.5 08/03/2022 0905   VLDL 19 08/03/2022 0905   LDLCALC 91 08/03/2022 0905   Lab Results  Component Value Date   HGBA1C 6.0 (H) 08/03/2022   No results found for: "LABOPIA", "COCAINSCRNUR", "LABBENZ", "AMPHETMU", "THCU", "LABBARB"  No results for input(s): "ETH" in the last 168 hours.  I have personally reviewed the radiological images below and agree with the radiology interpretations.  DG Abd Portable 1V  Result Date: 08/08/2022 CLINICAL DATA:  Feeding  tube placement EXAM: PORTABLE ABDOMEN - 1 VIEW COMPARISON:  CT abdomen 05/29/2006 FINDINGS: Partially visualized bilateral pulmonary nodules. Bilateral hilar lymphadenopathy. Feeding tube with the tip projecting over the antrum of the stomach. No bowel dilatation to suggest obstruction. No evidence of pneumoperitoneum, portal venous gas or pneumatosis. No pathologic calcifications along the expected course of the ureters. No acute osseous abnormality. IMPRESSION: 1. Feeding tube with the tip projecting over the antrum of the stomach. 2. Redemonstrated bilateral hilar lymphadenopathy and bilateral pulmonary nodules. Electronically Signed   By: Kathreen Devoid M.D.   On: 08/08/2022 13:00   EEG adult  Result Date: 08/07/2022 Derek Jack, MD     08/07/2022 12:34 PM [name] MRN: [id number] Epilepsy Attending: Su Monks MD Referring Physician/Provider: Donnetta Simpers, MD Date: 08/07/2022 Duration: 24.09 min  Patient history: 66 year old female with alteration in mental status. EEG to evaluate for seizure  Level  of alertness:  lethargic  AEDs during EEG study: None  Technical aspects: This EEG study was done with scalp electrodes positioned according to the 10-20 International system of electrode placement. Electrical activity was reviewed with band pass filter of 1-'70Hz'$ , sensitivity of 7 uV/mm, display speed of 84m/sec with a '60Hz'$  notched filter applied as appropriate. EEG data were recorded continuously and digitally stored.  Video monitoring was available and reviewed as appropriate.  Description: EEG showed continuous generalized 3 to 6 Hz theta-delta slowing. Generalized periodic discharges with triphasic morphology at  1.5-'2Hz'$  were also noted, more prominent when awake/stimulated. Hyperventilation and photic stimulation were not performed.    ABNORMALITY - Periodic discharges with triphasic morphology, generalized ( GPDs) - Continuous slow, generalized  IMPRESSION: This study showed generalized periodic discharges with triphasic morphology which can be on the ictal-interictal continuum. However, the morphology, frequency and reactivity to stimulation is more commonly indicative of toxic-metabolic causes. Additionally there is moderate to severe diffuse encephalopathy, nonspecific etiology. No seizures were seen throughout the recording. This EEG is unchanged compared to yesterday. CSu Monks MD [facility] [phone] 7pm- 7am, please page neurology on call as listed in [facility]   DG CHEST PORT 1 VIEW  Result Date: 08/06/2022 CLINICAL DATA:  Acute respiratory failure with hypoxia EXAM: PORTABLE CHEST 1 VIEW COMPARISON:  Chest x-ray 08/02/2022.  Chest CT 08/03/2022. FINDINGS: Bilateral pulmonary nodular densities are again noted and similar to the prior study. There is increased central pulmonary vascular congestion. The cardiomediastinal silhouette appears stable. There is no pneumothorax or acute fracture. IMPRESSION: 1. Increased central pulmonary vascular congestion. 2. Stable bilateral  pulmonary nodules. Electronically Signed   By: ARonney AstersM.D.   On: 08/06/2022 17:33   EEG adult  Result Date: 08/06/2022 YLora Havens MD     08/06/2022  8:43 AM Patient Name: SPERSIS GRAFFIUSMRN: [id number] Attending: PLora HavensReferring Physician/Provider: KDonnetta Simpers MD Date: 08/06/2022 Duration: 23.12 mins Patient history: 66year old female with alteration in mental status. EEG to evaluate for seizure Level of alertness:  lethargic AEDs during EEG study: None Technical aspects: This EEG study was done with scalp electrodes positioned according to the 10-20 International system of electrode placement. Electrical activity was reviewed with band pass filter of 1-'70Hz'$ , sensitivity of 7 uV/mm, display speed of 331msec with a '60Hz'$  notched filter applied as appropriate. EEG data were recorded continuously and digitally stored.  Video monitoring was available and reviewed as appropriate. Description: EEG showed continuous generalized 3 to 6 Hz theta-delta slowing. Generalized periodic discharges with triphasic morphology at  1.5-'2Hz'$  were also noted, more prominent when awake/stimulated. Hyperventilation and photic stimulation were not performed.   ABNORMALITY - Periodic discharges with triphasic morphology, generalized ( GPDs) - Continuous slow, generalized IMPRESSION: This study showed generalized periodic discharges with triphasic morphology which can be on the ictal-interictal continuum. However, the morphology, frequency and reactivity to stimulation is more commonly indicative of toxic-metabolic causes. Additionally there is moderate to severe diffuse encephalopathy, nonspecific etiology. No seizures were seen throughout the recording. Lora Havens   MR BRAIN WO CONTRAST  Result Date: 08/05/2022 CLINICAL DATA:  History of stroke, mental status change EXAM: MRI HEAD WITHOUT CONTRAST TECHNIQUE: Multiplanar, multiecho pulse sequences of the brain and surrounding  structures were obtained without intravenous contrast. COMPARISON:  CT head 1 day prior, brain MRI 08/02/2022 FINDINGS: Brain: Again seen are extensive acute infarcts throughout both cerebral and cerebellar hemispheres across bilateral ACA, MCA, and PCA distributions, overall increased in extent compared to the MRI from 3 days prior. There is associated FLAIR signal abnormality and a few punctate foci of SWI signal dropout suspicious for petechial hemorrhage but no organized hematoma or mass effect. The foci of intrinsic T1 hyperintensity in the occipital lobes may be due to petechial hemorrhage. There is no acute intracranial hemorrhage or extra-axial fluid collection Background parenchymal volume is normal. The ventricles are stable in size. There is no mass lesion. There is no mass effect or midline shift. Vascular: Normal flow voids. Skull and upper cervical spine: Normal marrow signal. Sinuses/Orbits: The paranasal sinuses are clear. The globes and orbits are unremarkable. Other: None. IMPRESSION: Extensive acute infarcts throughout both cerebral and cerebellar hemispheres across multiple vascular distributions again favored embolic with differential including watershed ischemia, increased in extent since the MRI from 3 days prior. Suspect some petechial hemorrhage but no hematoma or mass effect. Electronically Signed   By: Valetta Mole M.D.   On: 08/05/2022 19:45   ECHO TEE  Result Date: 08/05/2022    TRANSESOPHOGEAL ECHO REPORT   Patient Name:   MACKYNZIE WOOLFORD Date of Exam: 08/05/2022 Medical Rec #:  [id number]     Height:       63.0 in Accession #:    [id number]    Weight:       160.0 lb Date of Birth:  07-05-1956     BSA:          1.759 m Patient Age:    42 years      BP:           125/78 mmHg Patient Gender: F             HR:           99 bpm. Exam Location:  Inpatient Procedure: Transesophageal Echo, 3D Echo, Color Doppler, Cardiac Doppler and            Saline Contrast Bubble Study Indications:      Stroke i63.9  History:         Patient has prior history of Echocardiogram examinations, most                  recent 08/03/2022. Risk Factors:Hypertension, Diabetes and                  Dyslipidemia.  Sonographer:     Raquel Sarna Senior RDCS Referring Phys:  [id number] Margie Billet Diagnosing Phys: Oswaldo Milian MD PROCEDURE: After discussion of the risks and benefits of a TEE, an informed consent was obtained from the patient. The transesophogeal  probe was passed without difficulty through the esophogus of the patient. Sedation performed by different physician. The patient was monitored while under deep sedation. Anesthestetic sedation was provided intravenously by Anesthesiology: '70mg'$  of Propofol. The patient developed no complications during the procedure. IMPRESSIONS  1. Left ventricular ejection fraction, by estimation, is 55 to 60%. The left ventricle has normal function.  2. Right ventricular systolic function is normal. The right ventricular size is normal. There is moderately elevated pulmonary artery systolic pressure.  3. No left atrial/left atrial appendage thrombus was detected.  4. The mitral valve is normal in structure. Mild mitral valve regurgitation.  5. The aortic valve is tricuspid. Aortic valve regurgitation is not visualized.  6. Evidence of atrial level shunting detected by color flow Doppler. Agitated saline contrast bubble study was positive with shunting observed within 3-6 cardiac cycles suggestive of interatrial shunt. Few bubbles seen in left atrium after patient coughed, suggesting small PFO. Small PFO confirmed visually with left to right shunting.  7. No vegetation seen FINDINGS  Left Ventricle: Left ventricular ejection fraction, by estimation, is 55 to 60%. The left ventricle has normal function. The left ventricular internal cavity size was normal in size. Right Ventricle: The right ventricular size is normal. No increase in right ventricular wall thickness. Right ventricular  systolic function is normal. There is moderately elevated pulmonary artery systolic pressure. The tricuspid regurgitant velocity is 3.35 m/s, and with an assumed right atrial pressure of 3 mmHg, the estimated right ventricular systolic pressure is 08.6 mmHg. Left Atrium: Left atrial size was normal in size. No left atrial/left atrial appendage thrombus was detected. Right Atrium: Right atrial size was normal in size. Pericardium: Trivial pericardial effusion is present. Mitral Valve: The mitral valve is normal in structure. Mild mitral valve regurgitation. Tricuspid Valve: The tricuspid valve is normal in structure. Tricuspid valve regurgitation is mild. Aortic Valve: The aortic valve is tricuspid. Aortic valve regurgitation is not visualized. Aortic valve mean gradient measures 6.0 mmHg. Aortic valve peak gradient measures 10.2 mmHg. Pulmonic Valve: The pulmonic valve was grossly normal. Pulmonic valve regurgitation is not visualized. Aorta: The aortic root is normal in size and structure. IAS/Shunts: Evidence of atrial level shunting detected by color flow Doppler. Agitated saline contrast was given intravenously to evaluate for intracardiac shunting. Agitated saline contrast bubble study was positive with shunting observed within 3-6 cardiac cycles suggestive of interatrial shunt.  AORTIC VALVE AV Vmax:           160.00 cm/s AV Vmean:          112.000 cm/s AV VTI:            0.244 m AV Peak Grad:      10.2 mmHg AV Mean Grad:      6.0 mmHg LVOT Vmax:         115.00 cm/s LVOT Vmean:        83.500 cm/s LVOT VTI:          0.210 m LVOT/AV VTI ratio: 0.86 TRICUSPID VALVE TR Peak grad:   44.9 mmHg TR Vmax:        335.00 cm/s  SHUNTS Systemic VTI: 0.21 m Oswaldo Milian MD Electronically signed by Oswaldo Milian MD Signature Date/Time: 08/05/2022/4:04:01 PM    Final    CT VENOGRAM HEAD  Result Date: 08/04/2022 CLINICAL DATA:  Dural venous sinus thrombosis suspected. EXAM: CT VENOGRAM HEAD TECHNIQUE:  Venographic phase images of the brain were obtained following the administration of intravenous contrast. Multiplanar reformats and  maximum intensity projections were generated. RADIATION DOSE REDUCTION: This exam was performed according to the departmental dose-optimization program which includes automated exposure control, adjustment of the mA and/or kV according to patient size and/or use of iterative reconstruction technique. CONTRAST:  72m OMNIPAQUE IOHEXOL 350 MG/ML SOLN COMPARISON:  Head and neck CTA 08/04/2022.  Head MRI 08/02/2022. FINDINGS: The recent MRI demonstrated numerous acute infarcts involving both cerebral hemispheres and cerebellum, the majority of which are occult by CT. No acute intracranial hemorrhage, midline shift, or extra-axial fluid collection is identified. The ventricles are normal in size. No fracture or suspicious osseous lesion is identified. There is minimal mucosal thickening in the left sphenoid sinus. The mastoid air cells are clear. The orbits are unremarkable. CT venogram images are motion degraded, however the superior sagittal sinus, internal cerebral veins, vein of Galen, straight sinus, and sigmoid sinuses are enhancing without evidence of thrombosis. The transverse sinuses are small but grossly patent. A small filling defect in the posterior aspect of the superior sagittal sinus is compatible with an arachnoid granulation. IMPRESSION: No evidence of dural venous sinus thrombosis. Electronically Signed   By: ALogan BoresM.D.   On: 08/04/2022 16:45   CT ANGIO HEAD NECK W WO CM  Addendum Date: 08/04/2022   ADDENDUM REPORT: 08/04/2022 11:52 ADDENDUM: Findings were discussed with Dr. VEliseo Squireson 08/04/22 at 11:50 AM. Electronically Signed   By: HMarin RobertsM.D.   On: 08/04/2022 11:52   Result Date: 08/04/2022 CLINICAL DATA:  Stroke follow-up EXAM: CT ANGIOGRAPHY HEAD AND NECK TECHNIQUE: Multidetector CT imaging of the head and neck was performed using the standard  protocol during bolus administration of intravenous contrast. Multiplanar CT image reconstructions and MIPs were obtained to evaluate the vascular anatomy. Carotid stenosis measurements (when applicable) are obtained utilizing NASCET criteria, using the distal internal carotid diameter as the denominator. RADIATION DOSE REDUCTION: This exam was performed according to the departmental dose-optimization program which includes automated exposure control, adjustment of the mA and/or kV according to patient size and/or use of iterative reconstruction technique. CONTRAST:  669mOMNIPAQUE IOHEXOL 350 MG/ML SOLN COMPARISON:  MRI Head 08/02/22 FINDINGS: CT HEAD FINDINGS Brain: No evidence of hemorrhage, hydrocephalus, extra-axial collection or mass lesion/mass effect. 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 Vascular: See below Skull: Normal. Negative for fracture or focal lesion. Sinuses/Orbits: No acute finding. Other: None. Review of the MIP images confirms the above findings CTA NECK FINDINGS Aortic arch: Standard branching. Imaged portion shows no evidence of aneurysm or dissection. No significant stenosis of the major arch vessel origins. Right carotid system: No evidence of dissection, stenosis (50% or greater), or occlusion. Left carotid system: No evidence of dissection, stenosis (50% or greater), or occlusion. Vertebral arteries: Codominant. No evidence of dissection, stenosis (50% or greater), or occlusion. Skeleton: Negative. Other neck: Asymmetrically enlarged right level 5 lymph node measuring up to 6 mm (series 9, image 122). Additional enlarged right level 5 lymph node is present more superiorly measuring 9 mm (series 9, image 106). Enlarged 7 mm left level 5 lymph node is also present (series 9, image 115). 9 mm right level 2A lymph node (series  9, image 94). There is soft tissue stranding along the posterior right suboccipital neck (series 9, image 87), nonspecific. Upper chest: Small bilateral pleural effusions. There is a 1.2 cm right upper lobe pulmonary  nodule, better assessed on prior CT chest dated 08/04/2019. There also multiple enlarged mediastinal lymph nodes and left axillary lymph node. Review of the MIP images confirms the above findings CTA HEAD FINDINGS Anterior circulation: No significant stenosis, proximal occlusion, aneurysm, or vascular malformation. Posterior circulation: No significant stenosis, proximal occlusion, aneurysm, or vascular malformation. Venous sinuses: There is a congenitally small left transverse and sigmoid sinus. There is decreased opacification of the right-sided transverse and sigmoid sinus (series 11, image 122). This area of decreased opacification extends proximally to involve the superior sagittal sinus hazy month Anatomic variants: None Review of the MIP images confirms the above findings IMPRESSION: 1. Asymmetrically decreased opacification of the right-sided transverse and sigmoid sinus is worrisome for dural venous sinus thrombosis. Recommend further evaluation with a CT venogram. 2. No intracranial LVO or significant arterial stenosis in the neck. 3. No acute intracranial process. 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 4. Multiple enlarged lymph nodes in the neck, mediastinum, and left axilla, which are compatible with clinically suspected lymphoma. 5. Partially imaged pleural effusions and right sided pulmonary nodule, better seen on prior CTA chest. Electronically Signed: By: Marin Roberts M.D. On: 08/04/2022 11:17   VAS [place] LOWER EXTREMITY VENOUS (DVT)  Result Date: 08/03/2022  Lower Venous DVT Study Patient Name:  AMBERLYNN TEMPESTA  Date of  Exam:   08/03/2022 Medical Rec #: [id number]      Accession #:    [id number] Date of Birth: March 25, 1956      Patient Gender: F Patient Age:   22 years Exam Location:  [facility] Procedure:      VAS [place] LOWER EXTREMITY VENOUS (DVT) Referring Phys: TIMOTHY OPYD --------------------------------------------------------------------------------  Indications: Elevated d-dimer, lymphoma.  Comparison       06-15-2021 Prior left lower extremity venous was negative for Study:           DVT. Performing Technologist: Darlin Coco RDMS, RVT  Examination Guidelines: A complete evaluation includes B-mode imaging, spectral Doppler, color Doppler, and power Doppler as needed of all accessible portions of each vessel. Bilateral testing is considered an integral part of a complete examination. Limited examinations for reoccurring indications may be performed as noted. The reflux portion of the exam is performed with the patient in reverse Trendelenburg.  +---------+---------------+---------+-----------+----------+--------------+ RIGHT    CompressibilityPhasicitySpontaneityPropertiesThrombus Aging +---------+---------------+---------+-----------+----------+--------------+ CFV      Full           Yes      Yes                                 +---------+---------------+---------+-----------+----------+--------------+ SFJ      Full                                                        +---------+---------------+---------+-----------+----------+--------------+ FV Prox  Full                                                        +---------+---------------+---------+-----------+----------+--------------+  FV Mid   Full                                                        +---------+---------------+---------+-----------+----------+--------------+ FV DistalFull                                                        +---------+---------------+---------+-----------+----------+--------------+ PFV       Full                                                        +---------+---------------+---------+-----------+----------+--------------+ POP      Full           Yes      Yes                                 +---------+---------------+---------+-----------+----------+--------------+ PTV      Full                                                        +---------+---------------+---------+-----------+----------+--------------+ PERO     Full                                                        +---------+---------------+---------+-----------+----------+--------------+ Gastroc  Full                                                        +---------+---------------+---------+-----------+----------+--------------+   +---------+---------------+---------+-----------+----------+--------------+ LEFT     CompressibilityPhasicitySpontaneityPropertiesThrombus Aging +---------+---------------+---------+-----------+----------+--------------+ CFV      Full           Yes      Yes                                 +---------+---------------+---------+-----------+----------+--------------+ SFJ      Full                                                        +---------+---------------+---------+-----------+----------+--------------+ FV Prox  Full                                                        +---------+---------------+---------+-----------+----------+--------------+  FV Mid   Full                                                        +---------+---------------+---------+-----------+----------+--------------+ FV DistalFull                                                        +---------+---------------+---------+-----------+----------+--------------+ PFV      Full                                                        +---------+---------------+---------+-----------+----------+--------------+ POP      Full           Yes      Yes                                  +---------+---------------+---------+-----------+----------+--------------+ PTV      Full                                                        +---------+---------------+---------+-----------+----------+--------------+ PERO     Partial        No       No                   Acute          +---------+---------------+---------+-----------+----------+--------------+ Gastroc  Full                                                        +---------+---------------+---------+-----------+----------+--------------+     Summary: RIGHT: - There is no evidence of deep vein thrombosis in the lower extremity.  - No cystic structure found in the popliteal fossa.  - Ultrasound characteristics of enlarged lymph nodes are noted in the groin.  LEFT: - Findings consistent with acute deep vein thrombosis involving the left peroneal veins.  - No cystic structure found in the popliteal fossa.  - Ultrasound characteristics of enlarged lymph nodes noted in the groin.  *See table(s) above for measurements and observations. Electronically signed by Harold Barban MD on 08/03/2022 at 9:30:05 PM.    Final    ECHOCARDIOGRAM COMPLETE  Result Date: 08/03/2022    ECHOCARDIOGRAM REPORT   Patient Name:   JAEDYN MARRUFO Date of Exam: 08/03/2022 Medical Rec #:  [id number]     Height:       63.0 in Accession #:    [id number]    Weight:       160.0 lb Date of Birth:  08/26/1956     BSA:          1.759 m Patient Age:  66 years      BP:           152/94 mmHg Patient Gender: F             HR:           110 bpm. Exam Location:  Inpatient Procedure: 2D Echo Indications:    stroke  History:        Patient has prior history of Echocardiogram examinations and                 Patient has no prior history of Echocardiogram examinations.                 Sepsis; Risk Factors:Hypertension.  Sonographer:    Johny Chess RDCS Referring Phys: [id number] RIPUDEEP K RAI  Sonographer Comments: Image acquisition challenging due to  uncooperative patient. IMPRESSIONS  1. Left ventricular ejection fraction, by estimation, is 60 to 65%. The left ventricle has normal function. The left ventricle has no regional wall motion abnormalities. There is mild concentric left ventricular hypertrophy. Left ventricular diastolic parameters are consistent with Grade I diastolic dysfunction (impaired relaxation).  2. Right ventricular systolic function is normal. The right ventricular size is normal. Tricuspid regurgitation signal is inadequate for assessing PA pressure.  3. The mitral valve is normal in structure. No evidence of mitral valve regurgitation. No evidence of mitral stenosis.  4. The aortic valve is tricuspid. There is mild calcification of the aortic valve. Aortic valve regurgitation is not visualized. No aortic stenosis is present.  5. The inferior vena cava is normal in size with greater than 50% respiratory variability, suggesting right atrial pressure of 3 mmHg. FINDINGS  Left Ventricle: Left ventricular ejection fraction, by estimation, is 60 to 65%. The left ventricle has normal function. The left ventricle has no regional wall motion abnormalities. The left ventricular internal cavity size was normal in size. There is  mild concentric left ventricular hypertrophy. Left ventricular diastolic parameters are consistent with Grade I diastolic dysfunction (impaired relaxation). Right Ventricle: The right ventricular size is normal. No increase in right ventricular wall thickness. Right ventricular systolic function is normal. Tricuspid regurgitation signal is inadequate for assessing PA pressure. Left Atrium: Left atrial size was normal in size. Right Atrium: Right atrial size was normal in size. Pericardium: There is no evidence of pericardial effusion. Mitral Valve: The mitral valve is normal in structure. There is mild calcification of the mitral valve leaflet(s). Mild mitral annular calcification. No evidence of mitral valve regurgitation.  No evidence of mitral valve stenosis. Tricuspid Valve: The tricuspid valve is normal in structure. Tricuspid valve regurgitation is not demonstrated. Aortic Valve: The aortic valve is tricuspid. There is mild calcification of the aortic valve. Aortic valve regurgitation is not visualized. No aortic stenosis is present. Pulmonic Valve: The pulmonic valve was normal in structure. Pulmonic valve regurgitation is not visualized. Aorta: The aortic root is normal in size and structure. Venous: The inferior vena cava is normal in size with greater than 50% respiratory variability, suggesting right atrial pressure of 3 mmHg. IAS/Shunts: No atrial level shunt detected by color flow Doppler.  LEFT VENTRICLE PLAX 2D LVIDd:         4.20 cm LVIDs:         2.40 cm LV PW:         0.90 cm LV IVS:        1.20 cm LVOT diam:     1.60 cm LV SV:  48 LV SV Index:   27 LVOT Area:     2.01 cm  RIGHT VENTRICLE             IVC RV S prime:     20.30 cm/s  IVC diam: 1.30 cm TAPSE (M-mode): 1.6 cm LEFT ATRIUM             Index        RIGHT ATRIUM          Index LA diam:        3.40 cm 1.93 cm/m   RA Area:     9.64 cm LA Vol (A2C):   26.3 ml 14.95 ml/m  RA Volume:   19.10 ml 10.86 ml/m LA Vol (A4C):   27.5 ml 15.64 ml/m LA Biplane Vol: 27.9 ml 15.86 ml/m  AORTIC VALVE LVOT Vmax:   152.00 cm/s LVOT Vmean:  105.000 cm/s LVOT VTI:    0.238 m  AORTA Ao Root diam: 3.00 cm MV E velocity: 79.60 cm/s MV A velocity: 141.00 cm/s  SHUNTS MV E/A ratio:  0.56         Systemic VTI:  0.24 m                             Systemic Diam: 1.60 cm Dalton McleanMD Electronically signed by Franki Monte Signature Date/Time: 08/03/2022/2:41:22 PM    Final    CT Angio Chest Pulmonary Embolism (PE) W or WO Contrast  Result Date: 08/03/2022 CLINICAL DATA:  Pulmonary embolism suspected, positive D-dimer. Undergoing malignancy workup with recent biopsy suggesting lymphoma. EXAM: CT ANGIOGRAPHY CHEST WITH CONTRAST TECHNIQUE: Multidetector CT imaging of  the chest was performed using the standard protocol during bolus administration of intravenous contrast. Multiplanar CT image reconstructions and MIPs were obtained to evaluate the vascular anatomy. RADIATION DOSE REDUCTION: This exam was performed according to the departmental dose-optimization program which includes automated exposure control, adjustment of the mA and/or kV according to patient size and/or use of iterative reconstruction technique. CONTRAST:  31m OMNIPAQUE IOHEXOL 350 MG/ML SOLN COMPARISON:  None Available. FINDINGS: Cardiovascular: The heart is borderline enlarged. There is no pericardial effusion. Scattered coronary artery calcifications are noted. There is atherosclerotic calcification of the aorta without evidence of aneurysm. Pulmonary trunk is normal in caliber. No pulmonary artery filling defect is seen. Examination is limited due to respiratory motion artifact and pulmonary nodules. Mediastinum/Nodes: Multiple enlarged lymph nodes are present in the mediastinum and hilar regions bilaterally. No axillary lymphadenopathy. Thyroid gland, trachea, and esophagus are within normal limits. Lungs/Pleura: Hazy regional ground-glass opacities are present in the lungs bilaterally. There are multiple scattered pulmonary nodules bilaterally, the largest in the right lower lobe measuring 3.7 cm, axial image 110 and in the left lower lobe measuring 4.1 cm, axial image 94. No effusion or pneumothorax. Upper Abdomen: Enlarged lymph nodes are present in the gastrohepatic ligament measuring up to 1.1 cm. No acute abnormality. Musculoskeletal: No acute or suspicious osseous abnormality. Review of the MIP images confirms the above findings. IMPRESSION: 1. No evidence of pulmonary embolism. 2. Multiple enlarged mediastinal and hilar lymph nodes and pulmonary nodules bilaterally measuring up to 4.1 cm, concerning for neoplastic process or lymphoma. Correlation with recent biopsy results is recommended. 3. Mild  hazy geographic ground-glass opacities in the lungs, possible edema or air trapping. 4. Coronary artery calcifications. 5. Aortic atherosclerosis. Electronically Signed   By: LBrett FairyM.D.   On: 08/03/2022 01:10   MR  BRAIN WO CONTRAST  Result Date: 08/02/2022 CLINICAL DATA:  Altered mental status EXAM: MRI HEAD WITHOUT CONTRAST TECHNIQUE: Multiplanar, multiecho pulse sequences of the brain and surrounding structures were obtained without intravenous contrast. COMPARISON:  04/15/2013 FINDINGS: Brain: Numerous, diffuse bilateral foci of abnormal diffusion restriction in a pattern that is most suggestive of embolic shower but could also indicate watershed ischemia. No acute hemorrhage. There is multifocal hyperintense T2-weighted signal within the white matter. Generalized volume loss. The midline structures are normal. Vascular: Major flow voids are preserved. Skull and upper cervical spine: Normal calvarium and skull base. Visualized upper cervical spine and soft tissues are normal. Sinuses/Orbits:No paranasal sinus fluid levels or advanced mucosal thickening. No mastoid or middle ear effusion. Normal orbits. IMPRESSION: 1. Numerous, diffuse bilateral foci of abnormal diffusion restriction in a pattern that is most suggestive of embolic shower but could also indicate watershed ischemia. 2. No acute hemorrhage or mass effect. Electronically Signed   By: Ulyses Jarred M.D.   On: 08/02/2022 23:57   CT Head Wo Contrast  Result Date: 08/02/2022 CLINICAL DATA:  Altered mental status, recent diagnosis of lymphoma EXAM: CT HEAD WITHOUT CONTRAST TECHNIQUE: Contiguous axial images were obtained from the base of the skull through the vertex without intravenous contrast. RADIATION DOSE REDUCTION: This exam was performed according to the departmental dose-optimization program which includes automated exposure control, adjustment of the mA and/or kV according to patient size and/or use of iterative reconstruction  technique. COMPARISON:  MR brain done on 04/15/2013 FINDINGS: Brain: No acute intracranial findings are seen in noncontrast CT brain. There are no signs of bleeding within the cranium. Ventricles are not dilated. Cortical sulci are prominent. There is no focal edema or mass effect. Vascular: Unremarkable Skull: Unremarkable. Sinuses/Orbits: Unremarkable. Other: None. IMPRESSION: No acute intracranial findings are seen in noncontrast CT brain. Atrophy. Electronically Signed   By: Elmer Picker M.D.   On: 08/02/2022 16:35   DG Chest 2 View  Result Date: 08/02/2022 CLINICAL DATA:  Cough, altered mental status x3 days EXAM: CHEST - 2 VIEW COMPARISON:  04/21/2021 FINDINGS: Cardiac size is within normal limits. Thoracic aorta is tortuous. There are nodular densities of varying sizes scattered throughout both lungs largest overlying the inferior right hilum measuring 2.8 cm. Findings suggest possible metastatic disease. Less likely possibility would be nodular appearing multifocal pneumonia. There is no pleural effusion or pneumothorax. Degenerative changes are noted in both shoulders. IMPRESSION: There are multiple nodular densities of varying sizes in both lungs suggesting pulmonary metastatic disease. Follow-up CT, PET-CT and biopsy as warranted should be considered. Please correlate with clinical history for any known primary malignancy. Electronically Signed   By: Elmer Picker M.D.   On: 08/02/2022 15:37     PHYSICAL EXAM  Temp:  [97.4 F (36.3 C)-99 F (37.2 C)] 97.7 F (36.5 C) (10/16 1642) Pulse Rate:  [92-99] 94 (10/16 1642) Resp:  [18-23] 18 (10/16 1642) BP: (122-138)/(74-87) 125/74 (10/16 1642) SpO2:  [93 %-96 %] 93 % (10/16 1642)  General - Well nourished, well developed, mild tachypnea.  Ophthalmologic - fundi not visualized due to noncooperation.  Cardiovascular - Regular rhythm and rate.  Neuro - initially sleeping, but eyes open with pain stimulation. With eyes open,  eyes midline today, doll's eyes present, not tracking bilaterally, not blinking to visual threat bilaterally. With repetitive asking, she was mute today and not following any other commands. No facial droop. Tongue protrusion not cooperative. Withdraw to pain in all limbs, LLE stronger than other limbs.  Sensation, coordination and gait not tested.   ASSESSMENT/PLAN Ms. ADRIONA KANEY is a 65 y.o. female with history of recent diagnosed lymphoma, recent MRSA infection admitted for altered mental status, lethargy and not talking. No tPA given due to outside window.    Stroke:  bilateral embolic shower, etiology not quite clear, however could be due to distal DVT in the setting of small PFO or viscosity of blood from lymphoma with significant leukocytosis CT no acute abnormality MRI 75/10 embolic shower bilaterally CTA chest no PE CT head and neck and CTV unremarkable MRI 10/13 extension of embolic shower bilaterally 2D Echo EF 60 to 65% LE venous Doppler left peroneal vein DVT TEE showed small PFO LDL 91 HgbA1c 6.0 Ammonia level normal Heparin IV for VTE prophylaxis No antithrombotic prior to admission, now on heparin IV.  Still obtunded, add amantadine 200 bid for arousal/mental status  Ongoing aggressive stroke risk factor management Therapy recommendations: Pending Disposition: Pending  Hypertension Stable Long term BP goal normotensive  Hyperlipidemia Home meds: Zetia LDL 91, goal < 70 Intolerance to statin Continue zetia at discharge  Fever Tmax 101.4->afebrile Tachycardia and tachypnea now resolved  Intermittent coughing Blood culture negative, urine culture showed pansensitive E. Coli On Rocephin, Flagyl and vancomycin  Dysphagia Did not pass swallow n.p.o. now Speech on board cortrak placed for nutrition  PFO LE DVT Small PFO on TEE LE venous Doppler left peroneal vein DVT On heparin IV  Lymphoma Recent diagnosis at [facility] Dr. Lorenso Courier on board WBC  48.8->48.2->46.3->55.1->63.8->64.6 Viscosity of the blood can be part of the stroke etiology  Other Stroke Risk Factors Advanced age  Other Active Problems Recent MRSA infection, on vancomycin  Hospital day # 5    Rosalin Hawking, MD PhD Stroke Neurology 08/08/2022 6:05 PM    To contact Stroke Continuity provider, please refer to [email or web address] After hours, contact General Neurology

## 2022-08-08 NOTE — Progress Notes (Addendum)
Physical Therapy Treatment Patient Details Name: Sarah Walsh MRN: [id number] DOB: October 03, 1956 Today's Date: 08/08/2022   History of Present Illness 66 y.o. female presents to [facility] hospital on 08/02/2022 with lethargy, confusion, anorexia, gait instability. Pt recently diagnosed with T cell lymphoma at [facility]. MRI demonstrates numerous diffuse bilateral foci of abnormal diffusion restriction, suggestive of embolic shower. Also with new L lower extremity DVT. Repeat MRI due to worsening level of consciousness showed extension of embolic shower strokes, involving bil cerebral and cerebellar hemispheres. PMH includes HTN, HLP, DMII, T cell lymphoma    PT Comments    Pt was seen for very limited session on side of bed, but even so is demonstrating tolerance for being assisted to sit and support head.  Pt is moving a bit forward and back with trunk and little shifting with head to flex/ext with help.  Pt is observed to move toes and blink eyes with questions but may not fully understand the questions.  No eye tracking was elicited.  Progress to OOB and to move as tolerated.  Recommendations for follow up therapy are one component of a multi-disciplinary discharge planning process, led by the attending physician.  Recommendations may be updated based on patient status, additional functional criteria and insurance authorization.  Follow Up Recommendations  Acute inpatient rehab (3hours/day)     Assistance Recommended at Discharge Frequent or constant Supervision/Assistance  Patient can return home with the following Two people to help with walking and/or transfers;Two people to help with bathing/dressing/bathroom;Assistance with cooking/housework;Assistance with feeding;Direct supervision/assist for medications management;Direct supervision/assist for financial management;Assist for transportation;Help with stairs or ramp for entrance   Equipment Recommendations  Other (comment)     Recommendations for Other Services OT consult;Rehab consult     Precautions / Restrictions Precautions Precautions: Fall Precaution Comments: Purwick, IV, NG tube for cortrak Required Braces or Orthoses:  (requested prevalons) Restrictions Weight Bearing Restrictions: No     Mobility  Bed Mobility Overal bed mobility: Needs Assistance Bed Mobility: Supine to Sit, Sit to Supine Rolling: Total assist, +2 for physical assistance, +2 for safety/equipment   Supine to sit: Total assist, +2 for physical assistance, +2 for safety/equipment Sit to supine: Total assist, +2 for physical assistance, +2 for safety/equipment        Transfers Overall transfer level: Needs assistance                 General transfer comment: current transfer level is maximove    Ambulation/Gait               General Gait Details: unable   Stairs             Wheelchair Mobility    Modified Rankin (Stroke Patients Only)       Balance Overall balance assessment: Needs assistance Sitting-balance support: Feet supported, Bilateral upper extremity supported Sitting balance-Leahy Scale: Zero (no reliable head control) Sitting balance - Comments: pt is having one therapist hold head and support trunk posteriorly while the other moves limbs and looks for eye tracking       Standing balance comment: wait for further progression of strength                            Cognition Arousal/Alertness: Lethargic Behavior During Therapy: Flat affect Overall Cognitive Status: Difficult to assess  General Comments: impaired verbal communication but can respond to cues for moving toes or blinking        Exercises General Exercises - Lower Extremity Ankle Circles/Pumps: AAROM Quad Sets: AAROM Heel Slides: AAROM Hip ABduction/ADduction: AAROM Hip Flexion/Marching: AAROM    General Comments General comments (skin integrity,  edema, etc.): 2L O2, has      Pertinent Vitals/Pain Pain Assessment Pain Assessment: Faces Faces Pain Scale: Hurts little more Pain Location: jostling lines Pain Descriptors / Indicators: Guarding Pain Intervention(s): Limited activity within patient's tolerance, Monitored during session, Repositioned    Home Living Family/patient expects to be discharged to:: Private residence Living Arrangements: Spouse/significant other Available Help at Discharge: Family;Available PRN/intermittently Type of Home: Apartment Home Access: Level entry       Home Layout: One level Home Equipment: BSC/3in1;Toilet riser Additional Comments: obtained from daughter. pt lives with a "roommate" who is not available 24/7. dgtr lives 15 minutes away    Prior Function            PT Goals (current goals can now be found in the care plan section) Acute Rehab PT Goals Patient Stated Goal: not stated PT Goal Formulation: With family Progress towards PT goals: Progressing toward goals    Frequency    Min 4X/week      PT Plan Current plan remains appropriate    Co-evaluation   Reason for Co-Treatment: Complexity of the patient's impairments (multi-system involvement) PT goals addressed during session: Mobility/safety with mobility;Proper use of DME;Strengthening/ROM OT goals addressed during session: ADL's and self-care      AM-PAC PT "6 Clicks" Mobility   Outcome Measure  Help needed turning from your back to your side while in a flat bed without using bedrails?: Total Help needed moving from lying on your back to sitting on the side of a flat bed without using bedrails?: Total Help needed moving to and from a bed to a chair (including a wheelchair)?: Total Help needed standing up from a chair using your arms (e.g., wheelchair or bedside chair)?: Total Help needed to walk in hospital room?: Total Help needed climbing 3-5 steps with a railing? : Total 6 Click Score: 6    End of Session  Equipment Utilized During Treatment: Oxygen Activity Tolerance: Patient limited by fatigue;Treatment limited secondary to medical complications (Comment) Patient left: in bed;with call bell/phone within reach;with family/visitor present;with bed alarm set Nurse Communication: Mobility status PT Visit Diagnosis: Muscle weakness (generalized) (M62.81);Difficulty in walking, not elsewhere classified (R26.2);Other symptoms and signs involving the nervous system (R29.898)     Time: 1420-1503 PT Time Calculation (min) (ACUTE ONLY): 43 min  Charges:  $Therapeutic Exercise: 8-22 mins $Therapeutic Activity: 8-22 mins     Ramond Dial 08/08/2022, 4:58 PM  Mee Hives, PT PhD Acute Rehab Dept. Number: [place] and [place]

## 2022-08-09 ENCOUNTER — Encounter (HOSPITAL_COMMUNITY): Payer: Self-pay | Admitting: Cardiology

## 2022-08-09 DIAGNOSIS — I639 Cerebral infarction, unspecified: Secondary | ICD-10-CM | POA: Diagnosis not present

## 2022-08-09 DIAGNOSIS — I82451 Acute embolism and thrombosis of right peroneal vein: Secondary | ICD-10-CM | POA: Diagnosis not present

## 2022-08-09 DIAGNOSIS — G9341 Metabolic encephalopathy: Secondary | ICD-10-CM | POA: Diagnosis not present

## 2022-08-09 DIAGNOSIS — G9340 Encephalopathy, unspecified: Secondary | ICD-10-CM | POA: Diagnosis not present

## 2022-08-09 DIAGNOSIS — A4102 Sepsis due to Methicillin resistant Staphylococcus aureus: Secondary | ICD-10-CM | POA: Diagnosis not present

## 2022-08-09 DIAGNOSIS — I82452 Acute embolism and thrombosis of left peroneal vein: Secondary | ICD-10-CM | POA: Diagnosis not present

## 2022-08-09 LAB — GLUCOSE, CAPILLARY
Glucose-Capillary: 156 mg/dL — ABNORMAL HIGH (ref 70–99)
Glucose-Capillary: 162 mg/dL — ABNORMAL HIGH (ref 70–99)
Glucose-Capillary: 176 mg/dL — ABNORMAL HIGH (ref 70–99)
Glucose-Capillary: 181 mg/dL — ABNORMAL HIGH (ref 70–99)
Glucose-Capillary: 195 mg/dL — ABNORMAL HIGH (ref 70–99)
Glucose-Capillary: 195 mg/dL — ABNORMAL HIGH (ref 70–99)

## 2022-08-09 LAB — CBC WITH DIFFERENTIAL/PLATELET
Abs Immature Granulocytes: 0.2 10*3/uL — ABNORMAL HIGH (ref 0.00–0.07)
Basophils Absolute: 0.3 10*3/uL — ABNORMAL HIGH (ref 0.0–0.1)
Basophils Relative: 0 %
Eosinophils Absolute: 54.9 10*3/uL — ABNORMAL HIGH (ref 0.0–0.5)
Eosinophils Relative: 85 %
HCT: 38.6 % (ref 36.0–46.0)
Hemoglobin: 12.6 g/dL (ref 12.0–15.0)
Immature Granulocytes: 0 %
Lymphocytes Relative: 2 %
Lymphs Abs: 1.2 10*3/uL (ref 0.7–4.0)
MCH: 32.1 pg (ref 26.0–34.0)
MCHC: 32.6 g/dL (ref 30.0–36.0)
MCV: 98.5 fL (ref 80.0–100.0)
Monocytes Absolute: 0.6 10*3/uL (ref 0.1–1.0)
Monocytes Relative: 1 %
Neutro Abs: 7.8 10*3/uL — ABNORMAL HIGH (ref 1.7–7.7)
Neutrophils Relative %: 12 %
Platelets: 221 10*3/uL (ref 150–400)
RBC: 3.92 MIL/uL (ref 3.87–5.11)
RDW: 17.2 % — ABNORMAL HIGH (ref 11.5–15.5)
WBC: 65 10*3/uL (ref 4.0–10.5)
nRBC: 0 % (ref 0.0–0.2)

## 2022-08-09 LAB — COMPREHENSIVE METABOLIC PANEL
ALT: 11 U/L (ref 0–44)
AST: 18 U/L (ref 15–41)
Albumin: 2.1 g/dL — ABNORMAL LOW (ref 3.5–5.0)
Alkaline Phosphatase: 76 U/L (ref 38–126)
Anion gap: 11 (ref 5–15)
BUN: 18 mg/dL (ref 8–23)
CO2: 24 mmol/L (ref 22–32)
Calcium: 8.1 mg/dL — ABNORMAL LOW (ref 8.9–10.3)
Chloride: 103 mmol/L (ref 98–111)
Creatinine, Ser: 0.63 mg/dL (ref 0.44–1.00)
GFR, Estimated: >60 mL/min (ref >60)
Glucose, Bld: 173 mg/dL — ABNORMAL HIGH (ref 70–99)
Potassium: 3.6 mmol/L (ref 3.5–5.1)
Sodium: 138 mmol/L (ref 135–145)
Total Bilirubin: 0.1 mg/dL — ABNORMAL LOW (ref 0.3–1.2)
Total Protein: 5.7 g/dL — ABNORMAL LOW (ref 6.5–8.1)

## 2022-08-09 LAB — PHOSPHORUS
Phosphorus: 1.9 mg/dL — ABNORMAL LOW (ref 2.5–4.6)
Phosphorus: 2 mg/dL — ABNORMAL LOW (ref 2.5–4.6)

## 2022-08-09 LAB — MAGNESIUM
Magnesium: 2.1 mg/dL (ref 1.7–2.4)
Magnesium: 2.1 mg/dL (ref 1.7–2.4)

## 2022-08-09 LAB — FUNGITELL, SERUM: Fungitell Result: 41 pg/mL (ref <80)

## 2022-08-09 LAB — HEPARIN LEVEL (UNFRACTIONATED): Heparin Unfractionated: 0.5 IU/mL (ref 0.30–0.70)

## 2022-08-09 MED ORDER — ACETAMINOPHEN 650 MG RE SUPP
650.0000 mg | Freq: Four times a day (QID) | RECTAL | Status: DC
  Administered 2022-08-16 – 2022-08-18 (×9): 650 mg via RECTAL
  Filled 2022-08-09 (×8): qty 1

## 2022-08-09 MED ORDER — ONDANSETRON HCL 4 MG/2ML IJ SOLN: 4.0000 mg | Freq: Four times a day (QID) | INTRAMUSCULAR | Status: DC | PRN

## 2022-08-09 MED ORDER — EZETIMIBE 10 MG PO TABS
10.0000 mg | ORAL_TABLET | Freq: Every day | ORAL | Status: DC
  Administered 2022-08-10 – 2022-08-14 (×4): 10 mg
  Filled 2022-08-09 (×4): qty 1

## 2022-08-09 MED ORDER — AMANTADINE HCL 50 MG/5ML PO SOLN
200.0000 mg | Freq: Two times a day (BID) | ORAL | Status: DC
  Administered 2022-08-09 – 2022-08-14 (×9): 200 mg
  Filled 2022-08-09 (×13): qty 20

## 2022-08-09 MED ORDER — SENNOSIDES-DOCUSATE SODIUM 8.6-50 MG PO TABS: 1.0000 | ORAL_TABLET | Freq: Every evening | ORAL | Status: DC | PRN

## 2022-08-09 MED ORDER — SENNOSIDES 8.8 MG/5ML PO SYRP
5.0000 mL | ORAL_SOLUTION | Freq: Every day | ORAL | Status: DC
  Administered 2022-08-11 – 2022-08-13 (×3): 5 mL
  Filled 2022-08-09 (×7): qty 5

## 2022-08-09 MED ORDER — METOPROLOL TARTRATE 25 MG PO TABS
25.0000 mg | ORAL_TABLET | Freq: Two times a day (BID) | ORAL | Status: DC
  Administered 2022-08-09 – 2022-08-14 (×9): 25 mg
  Filled 2022-08-09 (×10): qty 1

## 2022-08-09 MED ORDER — DOCUSATE SODIUM 50 MG/5ML PO LIQD
100.0000 mg | Freq: Every day | ORAL | Status: DC
  Administered 2022-08-11 – 2022-08-13 (×3): 100 mg
  Filled 2022-08-09 (×7): qty 10

## 2022-08-09 MED ORDER — ONDANSETRON HCL 4 MG PO TABS: 4.0000 mg | ORAL_TABLET | Freq: Four times a day (QID) | ORAL | Status: DC | PRN

## 2022-08-09 MED ORDER — ACETAMINOPHEN 325 MG PO TABS
650.0000 mg | ORAL_TABLET | Freq: Four times a day (QID) | ORAL | Status: DC
  Administered 2022-08-09 – 2022-08-15 (×19): 650 mg
  Filled 2022-08-09 (×20): qty 2

## 2022-08-09 NOTE — Progress Notes (Signed)
ANTICOAGULATION CONSULT NOTE - Follow Up Consult  Pharmacy Consult for heparin Indication:  multiple embolic strokes with paradoxical embolism and DVT  Labs: Recent Labs    08/07/22 0218 08/07/22 0618 08/08/22 0152 08/09/22 0458  HGB 10.9* 12.9 12.1 12.6  HCT 30.8* 36.2 35.0* 38.6  PLT 181 208 208 221  HEPARINUNFRC 0.35  --  0.51 0.50  CREATININE  --  0.68 0.64 0.63     Assessment: 66yo female subtherapeutic on heparin with initial cautious dosing for paradoxical embolism and DVT in setting of multiple embolic strokes. Goals of care discussions are ongoing.   Heparin level returned at 0.5, at goal. Hg stable  Goal of Therapy:  Heparin level 0.3-0.5 units/ml per neurology   Plan:  Continue heparin at 1500 units/hr Monitor CBC daily with heparin level Will follow plans for oral anticoagulation  Sarah Walsh, PharmD Clinical Pharmacist **Pharmacist phone directory can now be found on [email or web address] ([email or web address]).  Listed under [place].

## 2022-08-09 NOTE — Progress Notes (Signed)
Physical Therapy Treatment Patient Details Name: Sarah Walsh MRN: [id number] DOB: Feb 09, 1956 Today's Date: 08/09/2022   History of Present Illness 66 y.o. female presents to [facility] hospital on 08/02/2022 with lethargy, confusion, anorexia, gait instability. Pt recently diagnosed with T cell lymphoma at [facility]. MRI demonstrates numerous diffuse bilateral foci of abnormal diffusion restriction, suggestive of embolic shower. Also with new L lower extremity DVT. Repeat MRI due to worsening level of consciousness showed extension of embolic shower strokes, involving bil cerebral and cerebellar hemispheres. PMH includes HTN, HLP, DMII, T cell lymphoma    PT Comments    Pt was seen for mobility on side of bed with two person assist, using second person to support her trunk.  Pt is very weak, with eyes closed initially and then only opening to look L regardless of head posture.  Pt is moving RLE on command as well as LUE, with extension being the main movement elicited.  Pt is recommended to continue rehab toward dc to CIR, with her family's approval.  Follow along with her for goals of PT, and work toward being OOB in chair with the lift equipment.  Encourage interaction with verbal and tactile stim to increase her awareness and active movement.   Recommendations for follow up therapy are one component of a multi-disciplinary discharge planning process, led by the attending physician.  Recommendations may be updated based on patient status, additional functional criteria and insurance authorization.  Follow Up Recommendations  Acute inpatient rehab (3hours/day)     Assistance Recommended at Discharge Frequent or constant Supervision/Assistance  Patient can return home with the following Two people to help with walking and/or transfers;Two people to help with bathing/dressing/bathroom;Assistance with cooking/housework;Assistance with feeding;Direct supervision/assist for medications management;Direct  supervision/assist for financial management;Assist for transportation;Help with stairs or ramp for entrance   Equipment Recommendations  Other (comment)    Recommendations for Other Services OT consult;Rehab consult     Precautions / Restrictions Precautions Precautions: Fall Precaution Comments: Purwick, IV, NG tube for cortrak Restrictions Weight Bearing Restrictions: No Other Position/Activity Restrictions: very weak neck requiring support in sitting     Mobility  Bed Mobility Overal bed mobility: Needs Assistance Bed Mobility: Supine to Sit, Sit to Supine Rolling: Total assist, +2 for physical assistance, +2 for safety/equipment   Supine to sit: Total assist, +2 for physical assistance, +2 for safety/equipment Sit to supine: Total assist, +2 for physical assistance, +2 for safety/equipment        Transfers                   General transfer comment: unsafe to attempt    Ambulation/Gait                   Stairs             Wheelchair Mobility    Modified Rankin (Stroke Patients Only)       Balance Overall balance assessment: Needs assistance Sitting-balance support: Feet supported, Bilateral upper extremity supported Sitting balance-Leahy Scale: Zero Sitting balance - Comments: son was present and was supportive of pt efforts to sit Postural control: Posterior lean, Left lateral lean                                  Cognition Arousal/Alertness: Lethargic Behavior During Therapy: Flat affect Overall Cognitive Status: Impaired/Different from baseline  General Comments: pt is non verbal today with only two blinking episodes, and did move RLE to extend and move ankle on command        Exercises General Exercises - Lower Extremity Ankle Circles/Pumps: AAROM, AROM Gluteal Sets: AAROM, AROM, Right, 5 reps Short Arc Quad: AROM, AAROM, Right, 5 reps    General Comments  General comments (skin integrity, edema, etc.): pt has general weakness with "baby doll" eyes trending to L side.  Worked to set up midline alignment on posture in bed, with pt looking lethargic      Pertinent Vitals/Pain Pain Assessment Pain Assessment: Faces Faces Pain Scale: Hurts little more Pain Location: jostling lines    Home Living                          Prior Function            PT Goals (current goals can now be found in the care plan section) Acute Rehab PT Goals Patient Stated Goal: not stated    Frequency    Min 4X/week      PT Plan Current plan remains appropriate    Co-evaluation              AM-PAC PT "6 Clicks" Mobility   Outcome Measure  Help needed turning from your back to your side while in a flat bed without using bedrails?: Total Help needed moving from lying on your back to sitting on the side of a flat bed without using bedrails?: Total Help needed moving to and from a bed to a chair (including a wheelchair)?: Total Help needed standing up from a chair using your arms (e.g., wheelchair or bedside chair)?: Total Help needed to walk in hospital room?: Total Help needed climbing 3-5 steps with a railing? : Total 6 Click Score: 6    End of Session Equipment Utilized During Treatment: Oxygen Activity Tolerance: Patient limited by fatigue;Treatment limited secondary to medical complications (Comment);Patient limited by lethargy Patient left: in bed;with call bell/phone within reach;with bed alarm set;with family/visitor present Nurse Communication: Mobility status;Other (comment) PT Visit Diagnosis: Muscle weakness (generalized) (M62.81);Difficulty in walking, not elsewhere classified (R26.2);Other symptoms and signs involving the nervous system (R29.898)     Time: 4585-9292 PT Time Calculation (min) (ACUTE ONLY): 39 min  Charges:  $Therapeutic Exercise: 8-22 mins $Therapeutic Activity: 23-37 mins     Ramond Dial 08/09/2022, 3:51 PM  Mee Hives, PT PhD Acute Rehab Dept. Number: [place] [facility] [place]

## 2022-08-09 NOTE — Progress Notes (Signed)
STROKE TEAM PROGRESS NOTE   SUBJECTIVE (INTERVAL HISTORY) Her son and PT are at the bedside.  Patient eyes open, however still not tracking not blinking to visual threat.  Spontaneous moving bilateral toes, questionable following commands on the bilateral toe movement.  No language output during round.  On tube feeding.   OBJECTIVE Temp:  [97.8 F (36.6 C)-98.1 F (36.7 C)] 98 F (36.7 C) (10/17 1806) Pulse Rate:  [88-100] 100 (10/17 1806) Cardiac Rhythm: Normal sinus rhythm (10/17 0715) Resp:  [19-22] 22 (10/17 1806) BP: (111-129)/(59-78) 118/67 (10/17 1806) SpO2:  [94 %-95 %] 95 % (10/17 1806) Weight:  [82.8 kg] 82.8 kg (10/17 0429)  Recent Labs  Lab 08/09/22 0011 08/09/22 0428 08/09/22 0800 08/09/22 1147 08/09/22 1652  GLUCAP 162* 181* 176* 195* 195*   Recent Labs  Lab 08/03/22 0410 08/04/22 0140 08/05/22 0314 08/06/22 0133 08/07/22 0618 08/08/22 0152 08/08/22 1701 08/09/22 0458 08/09/22 1606  NA 134*   < > 133* 136 136 139  --  138  --   K 3.4*   < > 3.2* 3.2* 3.2* 3.3*  --  3.6  --   CL 100   < > 104 107 104 103  --  103  --   CO2 23   < > 15* 18* 23 23  --  24  --   GLUCOSE 136*   < > 157* 144* 153* 124*  --  173*  --   BUN 7*   < > '8 11 14 14  '$ --  18  --   CREATININE 0.66   < > 0.87 0.72 0.68 0.64  --  0.63  --   CALCIUM 8.2*   < > 8.0* 7.9* 7.8* 8.0*  --  8.1*  --   MG 1.9  --   --   --   --   --  2.1 2.1 2.1  PHOS  --   --   --   --   --   --  1.9* 1.9* 2.0*   < > = values in this interval not displayed.   Recent Labs  Lab 08/09/22 0458  AST 18  ALT 11  ALKPHOS 76  BILITOT 0.1*  PROT 5.7*  ALBUMIN 2.1*   Recent Labs  Lab 08/06/22 0133 08/07/22 0218 08/07/22 0618 08/08/22 0152 08/09/22 0458  WBC 55.1* 54.5* 63.8* 64.6* 65.0*  NEUTROABS  --   --  13.4* 7.7 7.8*  HGB 13.1 10.9* 12.9 12.1 12.6  HCT 37.5 30.8* 36.2 35.0* 38.6  MCV 93.5 93.3 93.3 95.1 98.5  PLT 196 181 208 208 221   No results for input(s): "CKTOTAL", "CKMB", "CKMBINDEX",  "TROPONINI" in the last 168 hours. No results for input(s): "LABPROT", "INR" in the last 72 hours. Recent Labs    08/08/22 1415  COLORURINE AMBER*  LABSPEC 1.042*  PHURINE 5.0  GLUCOSEU 50*  HGBUR NEGATIVE  BILIRUBINUR MODERATE*  KETONESUR 80*  PROTEINUR 100*  NITRITE NEGATIVE  LEUKOCYTESUR TRACE*       Component Value Date/Time   CHOL 127 08/03/2022 0905   TRIG 97 08/03/2022 0905   HDL 17 (L) 08/03/2022 0905   CHOLHDL 7.5 08/03/2022 0905   VLDL 19 08/03/2022 0905   LDLCALC 91 08/03/2022 0905   Lab Results  Component Value Date   HGBA1C 6.0 (H) 08/03/2022   No results found for: "LABOPIA", "COCAINSCRNUR", "LABBENZ", "AMPHETMU", "THCU", "LABBARB"  No results for input(s): "ETH" in the last 168 hours.  I have personally reviewed the radiological  images below and agree with the radiology interpretations.  DG Abd Portable 1V  Result Date: 08/08/2022 CLINICAL DATA:  Feeding tube placement EXAM: PORTABLE ABDOMEN - 1 VIEW COMPARISON:  CT abdomen 05/29/2006 FINDINGS: Partially visualized bilateral pulmonary nodules. Bilateral hilar lymphadenopathy. Feeding tube with the tip projecting over the antrum of the stomach. No bowel dilatation to suggest obstruction. No evidence of pneumoperitoneum, portal venous gas or pneumatosis. No pathologic calcifications along the expected course of the ureters. No acute osseous abnormality. IMPRESSION: 1. Feeding tube with the tip projecting over the antrum of the stomach. 2. Redemonstrated bilateral hilar lymphadenopathy and bilateral pulmonary nodules. Electronically Signed   By: Kathreen Devoid M.D.   On: 08/08/2022 13:00   EEG adult  Result Date: 08/07/2022 Derek Jack, MD     08/07/2022 12:34 PM Patient Name: KIMMI ACOCELLA MRN: [id number] Epilepsy Attending: Su Monks MD Referring Physician/Provider: Donnetta Simpers, MD Date: 08/07/2022 Duration: 24.09 min  Patient history: 66 year old female with alteration in mental status. EEG to  evaluate for seizure  Level of alertness:  lethargic  AEDs during EEG study: None  Technical aspects: This EEG study was done with scalp electrodes positioned according to the 10-20 International system of electrode placement. Electrical activity was reviewed with band pass filter of 1-'70Hz'$ , sensitivity of 7 uV/mm, display speed of 26m/sec with a '60Hz'$  notched filter applied as appropriate. EEG data were recorded continuously and digitally stored.  Video monitoring was available and reviewed as appropriate.  Description: EEG showed continuous generalized 3 to 6 Hz theta-delta slowing. Generalized periodic discharges with triphasic morphology at  1.5-'2Hz'$  were also noted, more prominent when awake/stimulated. Hyperventilation and photic stimulation were not performed.    ABNORMALITY - Periodic discharges with triphasic morphology, generalized ( GPDs) - Continuous slow, generalized  IMPRESSION: This study showed generalized periodic discharges with triphasic morphology which can be on the ictal-interictal continuum. However, the morphology, frequency and reactivity to stimulation is more commonly indicative of toxic-metabolic causes. Additionally there is moderate to severe diffuse encephalopathy, nonspecific etiology. No seizures were seen throughout the recording. This EEG is unchanged compared to yesterday. CSu Monks MD [facility] [phone] 7pm- 7am, please page neurology on call as listed in [place]   DG CHEST PORT 1 VIEW  Result Date: 08/06/2022 CLINICAL DATA:  Acute respiratory failure with hypoxia EXAM: PORTABLE CHEST 1 VIEW COMPARISON:  Chest x-ray 08/02/2022.  Chest CT 08/03/2022. FINDINGS: Bilateral pulmonary nodular densities are again noted and similar to the prior study. There is increased central pulmonary vascular congestion. The cardiomediastinal silhouette appears stable. There is no pneumothorax or acute fracture. IMPRESSION: 1. Increased central pulmonary vascular  congestion. 2. Stable bilateral pulmonary nodules. Electronically Signed   By: ARonney AstersM.D.   On: 08/06/2022 17:33   EEG adult  Result Date: 08/06/2022 YLora Havens MD     08/06/2022  8:43 AM Patient Name: STABITHIA STRODERMRN: [id number] Attending: PLora HavensReferring Physician/Provider: KDonnetta Simpers MD Date: 08/06/2022 Duration: 23.12 mins Patient history: 66year old female with alteration in mental status. EEG to evaluate for seizure Level of alertness:  lethargic AEDs during EEG study: None Technical aspects: This EEG study was done with scalp electrodes positioned according to the 10-20 International system of electrode placement. Electrical activity was reviewed with band pass filter of 1-'70Hz'$ , sensitivity of 7 uV/mm, display speed of 369msec with a '60Hz'$  notched filter applied as appropriate. EEG data were recorded continuously and digitally stored.  Video monitoring was available and  reviewed as appropriate. Description: EEG showed continuous generalized 3 to 6 Hz theta-delta slowing. Generalized periodic discharges with triphasic morphology at  1.5-'2Hz'$  were also noted, more prominent when awake/stimulated. Hyperventilation and photic stimulation were not performed.   ABNORMALITY - Periodic discharges with triphasic morphology, generalized ( GPDs) - Continuous slow, generalized IMPRESSION: This study showed generalized periodic discharges with triphasic morphology which can be on the ictal-interictal continuum. However, the morphology, frequency and reactivity to stimulation is more commonly indicative of toxic-metabolic causes. Additionally there is moderate to severe diffuse encephalopathy, nonspecific etiology. No seizures were seen throughout the recording. Lora Havens   MR BRAIN WO CONTRAST  Result Date: 08/05/2022 CLINICAL DATA:  History of stroke, mental status change EXAM: MRI HEAD WITHOUT CONTRAST TECHNIQUE: Multiplanar, multiecho pulse sequences of the  brain and surrounding structures were obtained without intravenous contrast. COMPARISON:  CT head 1 day prior, brain MRI 08/02/2022 FINDINGS: Brain: Again seen are extensive acute infarcts throughout both cerebral and cerebellar hemispheres across bilateral ACA, MCA, and PCA distributions, overall increased in extent compared to the MRI from 3 days prior. There is associated FLAIR signal abnormality and a few punctate foci of SWI signal dropout suspicious for petechial hemorrhage but no organized hematoma or mass effect. The foci of intrinsic T1 hyperintensity in the occipital lobes may be due to petechial hemorrhage. There is no acute intracranial hemorrhage or extra-axial fluid collection Background parenchymal volume is normal. The ventricles are stable in size. There is no mass lesion. There is no mass effect or midline shift. Vascular: Normal flow voids. Skull and upper cervical spine: Normal marrow signal. Sinuses/Orbits: The paranasal sinuses are clear. The globes and orbits are unremarkable. Other: None. IMPRESSION: Extensive acute infarcts throughout both cerebral and cerebellar hemispheres across multiple vascular distributions again favored embolic with differential including watershed ischemia, increased in extent since the MRI from 3 days prior. Suspect some petechial hemorrhage but no hematoma or mass effect. Electronically Signed   By: Valetta Mole M.D.   On: 08/05/2022 19:45   ECHO TEE  Result Date: 08/05/2022    TRANSESOPHOGEAL ECHO REPORT   Patient Name:   SUNDAE MANERS Date of Exam: 08/05/2022 Medical Rec #:  [id number]     Height:       63.0 in Accession #:    [id number]    Weight:       160.0 lb Date of Birth:  [birth year]     BSA:          1.759 m Patient Age:    75 years      BP:           125/78 mmHg Patient Gender: F             HR:           99 bpm. Exam Location:  Inpatient Procedure: Transesophageal Echo, 3D Echo, Color Doppler, Cardiac Doppler and            Saline Contrast Bubble  Study Indications:     Stroke i63.9  History:         Patient has prior history of Echocardiogram examinations, most                  recent 08/03/2022. Risk Factors:Hypertension, Diabetes and                  Dyslipidemia.  Sonographer:     Raquel Sarna Senior RDCS Referring Phys:  [id number] Margie Billet Diagnosing Phys: Oswaldo Milian  MD PROCEDURE: After discussion of the risks and benefits of a TEE, an informed consent was obtained from the patient. The transesophogeal probe was passed without difficulty through the esophogus of the patient. Sedation performed by different physician. The patient was monitored while under deep sedation. Anesthestetic sedation was provided intravenously by Anesthesiology: '70mg'$  of Propofol. The patient developed no complications during the procedure. IMPRESSIONS  1. Left ventricular ejection fraction, by estimation, is 55 to 60%. The left ventricle has normal function.  2. Right ventricular systolic function is normal. The right ventricular size is normal. There is moderately elevated pulmonary artery systolic pressure.  3. No left atrial/left atrial appendage thrombus was detected.  4. The mitral valve is normal in structure. Mild mitral valve regurgitation.  5. The aortic valve is tricuspid. Aortic valve regurgitation is not visualized.  6. Evidence of atrial level shunting detected by color flow Doppler. Agitated saline contrast bubble study was positive with shunting observed within 3-6 cardiac cycles suggestive of interatrial shunt. Few bubbles seen in left atrium after patient coughed, suggesting small PFO. Small PFO confirmed visually with left to right shunting.  7. No vegetation seen FINDINGS  Left Ventricle: Left ventricular ejection fraction, by estimation, is 55 to 60%. The left ventricle has normal function. The left ventricular internal cavity size was normal in size. Right Ventricle: The right ventricular size is normal. No increase in right ventricular wall  thickness. Right ventricular systolic function is normal. There is moderately elevated pulmonary artery systolic pressure. The tricuspid regurgitant velocity is 3.35 m/s, and with an assumed right atrial pressure of 3 mmHg, the estimated right ventricular systolic pressure is 55.9 mmHg. Left Atrium: Left atrial size was normal in size. No left atrial/left atrial appendage thrombus was detected. Right Atrium: Right atrial size was normal in size. Pericardium: Trivial pericardial effusion is present. Mitral Valve: The mitral valve is normal in structure. Mild mitral valve regurgitation. Tricuspid Valve: The tricuspid valve is normal in structure. Tricuspid valve regurgitation is mild. Aortic Valve: The aortic valve is tricuspid. Aortic valve regurgitation is not visualized. Aortic valve mean gradient measures 6.0 mmHg. Aortic valve peak gradient measures 10.2 mmHg. Pulmonic Valve: The pulmonic valve was grossly normal. Pulmonic valve regurgitation is not visualized. Aorta: The aortic root is normal in size and structure. IAS/Shunts: Evidence of atrial level shunting detected by color flow Doppler. Agitated saline contrast was given intravenously to evaluate for intracardiac shunting. Agitated saline contrast bubble study was positive with shunting observed within 3-6 cardiac cycles suggestive of interatrial shunt.  AORTIC VALVE AV Vmax:           160.00 cm/s AV Vmean:          112.000 cm/s AV VTI:            0.244 m AV Peak Grad:      10.2 mmHg AV Mean Grad:      6.0 mmHg LVOT Vmax:         115.00 cm/s LVOT Vmean:        83.500 cm/s LVOT VTI:          0.210 m LVOT/AV VTI ratio: 0.86 TRICUSPID VALVE TR Peak grad:   44.9 mmHg TR Vmax:        335.00 cm/s  SHUNTS Systemic VTI: 0.21 m Oswaldo Milian MD Electronically signed by Oswaldo Milian MD Signature Date/Time: 08/05/2022/4:04:01 PM    Final    CT VENOGRAM HEAD  Result Date: 08/04/2022 CLINICAL DATA:  Dural venous sinus thrombosis suspected. EXAM:  CT  VENOGRAM HEAD TECHNIQUE: Venographic phase images of the brain were obtained following the administration of intravenous contrast. Multiplanar reformats and maximum intensity projections were generated. RADIATION DOSE REDUCTION: This exam was performed according to the departmental dose-optimization program which includes automated exposure control, adjustment of the mA and/or kV according to patient size and/or use of iterative reconstruction technique. CONTRAST:  21m OMNIPAQUE IOHEXOL 350 MG/ML SOLN COMPARISON:  Head and neck CTA 08/04/2022.  Head MRI 08/02/2022. FINDINGS: The recent MRI demonstrated numerous acute infarcts involving both cerebral hemispheres and cerebellum, the majority of which are occult by CT. No acute intracranial hemorrhage, midline shift, or extra-axial fluid collection is identified. The ventricles are normal in size. No fracture or suspicious osseous lesion is identified. There is minimal mucosal thickening in the left sphenoid sinus. The mastoid air cells are clear. The orbits are unremarkable. CT venogram images are motion degraded, however the superior sagittal sinus, internal cerebral veins, vein of Galen, straight sinus, and sigmoid sinuses are enhancing without evidence of thrombosis. The transverse sinuses are small but grossly patent. A small filling defect in the posterior aspect of the superior sagittal sinus is compatible with an arachnoid granulation. IMPRESSION: No evidence of dural venous sinus thrombosis. Electronically Signed   By: ALogan BoresM.D.   On: 08/04/2022 16:45   CT ANGIO HEAD NECK W WO CM  Addendum Date: 08/04/2022   ADDENDUM REPORT: 08/04/2022 11:52 ADDENDUM: Findings were discussed with Dr. VEliseo Squireson 08/04/22 at 11:50 AM. Electronically Signed   By: HMarin RobertsM.D.   On: 08/04/2022 11:52   Result Date: 08/04/2022 CLINICAL DATA:  Stroke follow-up EXAM: CT ANGIOGRAPHY HEAD AND NECK TECHNIQUE: Multidetector CT imaging of the head and neck was performed  using the standard protocol during bolus administration of intravenous contrast. Multiplanar CT image reconstructions and MIPs were obtained to evaluate the vascular anatomy. Carotid stenosis measurements (when applicable) are obtained utilizing NASCET criteria, using the distal internal carotid diameter as the denominator. RADIATION DOSE REDUCTION: This exam was performed according to the departmental dose-optimization program which includes automated exposure control, adjustment of the mA and/or kV according to patient size and/or use of iterative reconstruction technique. CONTRAST:  64mOMNIPAQUE IOHEXOL 350 MG/ML SOLN COMPARISON:  MRI Head 08/02/22 FINDINGS: CT HEAD FINDINGS Brain: No evidence of hemorrhage, hydrocephalus, extra-axial collection or mass lesion/mass effect. 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 Vascular: See below Skull: Normal. Negative for fracture or focal lesion. Sinuses/Orbits: No acute finding. Other: None. Review of the MIP images confirms the above findings CTA NECK FINDINGS Aortic arch: Standard branching. Imaged portion shows no evidence of aneurysm or dissection. No significant stenosis of the major arch vessel origins. Right carotid system: No evidence of dissection, stenosis (50% or greater), or occlusion. Left carotid system: No evidence of dissection, stenosis (50% or greater), or occlusion. Vertebral arteries: Codominant. No evidence of dissection, stenosis (50% or greater), or occlusion. Skeleton: Negative. Other neck: Asymmetrically enlarged right level 5 lymph node measuring up to 6 mm (series 9, image 122). Additional enlarged right level 5 lymph node is present more superiorly measuring 9 mm (series 9, image 106). Enlarged 7 mm left level 5 lymph node is also present (series 9, image 115). 9 mm right level 2A  lymph node (series 9, image 94). There is soft tissue stranding along the posterior right  suboccipital neck (series 9, image 87), nonspecific. Upper chest: Small bilateral pleural effusions. There is a 1.2 cm right upper lobe pulmonary nodule, better assessed on prior CT chest dated 08/04/2019. There also multiple enlarged mediastinal lymph nodes and left axillary lymph node. Review of the MIP images confirms the above findings CTA HEAD FINDINGS Anterior circulation: No significant stenosis, proximal occlusion, aneurysm, or vascular malformation. Posterior circulation: No significant stenosis, proximal occlusion, aneurysm, or vascular malformation. Venous sinuses: There is a congenitally small left transverse and sigmoid sinus. There is decreased opacification of the right-sided transverse and sigmoid sinus (series 11, image 122). This area of decreased opacification extends proximally to involve the superior sagittal sinus hazy month Anatomic variants: None Review of the MIP images confirms the above findings IMPRESSION: 1. Asymmetrically decreased opacification of the right-sided transverse and sigmoid sinus is worrisome for dural venous sinus thrombosis. Recommend further evaluation with a CT venogram. 2. No intracranial LVO or significant arterial stenosis in the neck. 3. No acute intracranial process. 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 4. Multiple enlarged lymph nodes in the neck, mediastinum, and left axilla, which are compatible with clinically suspected lymphoma. 5. Partially imaged pleural effusions and right sided pulmonary nodule, better seen on prior CTA chest. Electronically Signed: By: Marin Roberts M.D. On: 08/04/2022 11:17   VAS [place] LOWER EXTREMITY VENOUS (DVT)  Result Date: 08/03/2022  Lower Venous DVT Study Patient Name:  LYSHA SCHRADE  Date of Exam:   08/03/2022 Medical Rec #: [id number]      Accession #:    [id number] Date of Birth: 1955-11-21      Patient Gender: F Patient Age:   40 years Exam Location:  [facility] Procedure:      VAS [place] LOWER EXTREMITY VENOUS (DVT) Referring Phys: TIMOTHY OPYD --------------------------------------------------------------------------------  Indications: Elevated d-dimer, lymphoma.  Comparison       06-15-2021 Prior left lower extremity venous was negative for Study:           DVT. Performing Technologist: Darlin Coco RDMS, RVT  Examination Guidelines: A complete evaluation includes B-mode imaging, spectral Doppler, color Doppler, and power Doppler as needed of all accessible portions of each vessel. Bilateral testing is considered an integral part of a complete examination. Limited examinations for reoccurring indications may be performed as noted. The reflux portion of the exam is performed with the patient in reverse Trendelenburg.  +---------+---------------+---------+-----------+----------+--------------+ RIGHT    CompressibilityPhasicitySpontaneityPropertiesThrombus Aging +---------+---------------+---------+-----------+----------+--------------+ CFV      Full           Yes      Yes                                 +---------+---------------+---------+-----------+----------+--------------+ SFJ      Full                                                        +---------+---------------+---------+-----------+----------+--------------+ FV Prox  Full                                                        +---------+---------------+---------+-----------+----------+--------------+  FV Mid   Full                                                        +---------+---------------+---------+-----------+----------+--------------+ FV DistalFull                                                         +---------+---------------+---------+-----------+----------+--------------+ PFV      Full                                                        +---------+---------------+---------+-----------+----------+--------------+ POP      Full           Yes      Yes                                 +---------+---------------+---------+-----------+----------+--------------+ PTV      Full                                                        +---------+---------------+---------+-----------+----------+--------------+ PERO     Full                                                        +---------+---------------+---------+-----------+----------+--------------+ Gastroc  Full                                                        +---------+---------------+---------+-----------+----------+--------------+   +---------+---------------+---------+-----------+----------+--------------+ LEFT     CompressibilityPhasicitySpontaneityPropertiesThrombus Aging +---------+---------------+---------+-----------+----------+--------------+ CFV      Full           Yes      Yes                                 +---------+---------------+---------+-----------+----------+--------------+ SFJ      Full                                                        +---------+---------------+---------+-----------+----------+--------------+ FV Prox  Full                                                        +---------+---------------+---------+-----------+----------+--------------+  FV Mid   Full                                                        +---------+---------------+---------+-----------+----------+--------------+ FV DistalFull                                                        +---------+---------------+---------+-----------+----------+--------------+ PFV      Full                                                         +---------+---------------+---------+-----------+----------+--------------+ POP      Full           Yes      Yes                                 +---------+---------------+---------+-----------+----------+--------------+ PTV      Full                                                        +---------+---------------+---------+-----------+----------+--------------+ PERO     Partial        No       No                   Acute          +---------+---------------+---------+-----------+----------+--------------+ Gastroc  Full                                                        +---------+---------------+---------+-----------+----------+--------------+     Summary: RIGHT: - There is no evidence of deep vein thrombosis in the lower extremity.  - No cystic structure found in the popliteal fossa.  - Ultrasound characteristics of enlarged lymph nodes are noted in the groin.  LEFT: - Findings consistent with acute deep vein thrombosis involving the left peroneal veins.  - No cystic structure found in the popliteal fossa.  - Ultrasound characteristics of enlarged lymph nodes noted in the groin.  *See table(s) above for measurements and observations. Electronically signed by Harold Barban MD on 08/03/2022 at 9:30:05 PM.    Final    ECHOCARDIOGRAM COMPLETE  Result Date: 08/03/2022    ECHOCARDIOGRAM REPORT   Patient Name:   PEGEEN STIGER Date of Exam: 08/03/2022 Medical Rec #:  [id number]     Height:       63.0 in Accession #:    [id number]    Weight:       160.0 lb Date of Birth:  [birth year]     BSA:          1.759 m Patient Age:  66 years      BP:           152/94 mmHg Patient Gender: F             HR:           110 bpm. Exam Location:  Inpatient Procedure: 2D Echo Indications:    stroke  History:        Patient has prior history of Echocardiogram examinations and                 Patient has no prior history of Echocardiogram examinations.                 Sepsis; Risk Factors:Hypertension.   Sonographer:    Johny Chess RDCS Referring Phys: [id number] RIPUDEEP K RAI  Sonographer Comments: Image acquisition challenging due to uncooperative patient. IMPRESSIONS  1. Left ventricular ejection fraction, by estimation, is 60 to 65%. The left ventricle has normal function. The left ventricle has no regional wall motion abnormalities. There is mild concentric left ventricular hypertrophy. Left ventricular diastolic parameters are consistent with Grade I diastolic dysfunction (impaired relaxation).  2. Right ventricular systolic function is normal. The right ventricular size is normal. Tricuspid regurgitation signal is inadequate for assessing PA pressure.  3. The mitral valve is normal in structure. No evidence of mitral valve regurgitation. No evidence of mitral stenosis.  4. The aortic valve is tricuspid. There is mild calcification of the aortic valve. Aortic valve regurgitation is not visualized. No aortic stenosis is present.  5. The inferior vena cava is normal in size with greater than 50% respiratory variability, suggesting right atrial pressure of 3 mmHg. FINDINGS  Left Ventricle: Left ventricular ejection fraction, by estimation, is 60 to 65%. The left ventricle has normal function. The left ventricle has no regional wall motion abnormalities. The left ventricular internal cavity size was normal in size. There is  mild concentric left ventricular hypertrophy. Left ventricular diastolic parameters are consistent with Grade I diastolic dysfunction (impaired relaxation). Right Ventricle: The right ventricular size is normal. No increase in right ventricular wall thickness. Right ventricular systolic function is normal. Tricuspid regurgitation signal is inadequate for assessing PA pressure. Left Atrium: Left atrial size was normal in size. Right Atrium: Right atrial size was normal in size. Pericardium: There is no evidence of pericardial effusion. Mitral Valve: The mitral valve is normal in structure.  There is mild calcification of the mitral valve leaflet(s). Mild mitral annular calcification. No evidence of mitral valve regurgitation. No evidence of mitral valve stenosis. Tricuspid Valve: The tricuspid valve is normal in structure. Tricuspid valve regurgitation is not demonstrated. Aortic Valve: The aortic valve is tricuspid. There is mild calcification of the aortic valve. Aortic valve regurgitation is not visualized. No aortic stenosis is present. Pulmonic Valve: The pulmonic valve was normal in structure. Pulmonic valve regurgitation is not visualized. Aorta: The aortic root is normal in size and structure. Venous: The inferior vena cava is normal in size with greater than 50% respiratory variability, suggesting right atrial pressure of 3 mmHg. IAS/Shunts: No atrial level shunt detected by color flow Doppler.  LEFT VENTRICLE PLAX 2D LVIDd:         4.20 cm LVIDs:         2.40 cm LV PW:         0.90 cm LV IVS:        1.20 cm LVOT diam:     1.60 cm LV SV:  48 LV SV Index:   27 LVOT Area:     2.01 cm  RIGHT VENTRICLE             IVC RV S prime:     20.30 cm/s  IVC diam: 1.30 cm TAPSE (M-mode): 1.6 cm LEFT ATRIUM             Index        RIGHT ATRIUM          Index LA diam:        3.40 cm 1.93 cm/m   RA Area:     9.64 cm LA Vol (A2C):   26.3 ml 14.95 ml/m  RA Volume:   19.10 ml 10.86 ml/m LA Vol (A4C):   27.5 ml 15.64 ml/m LA Biplane Vol: 27.9 ml 15.86 ml/m  AORTIC VALVE LVOT Vmax:   152.00 cm/s LVOT Vmean:  105.000 cm/s LVOT VTI:    0.238 m  AORTA Ao Root diam: 3.00 cm MV E velocity: 79.60 cm/s MV A velocity: 141.00 cm/s  SHUNTS MV E/A ratio:  0.56         Systemic VTI:  0.24 m                             Systemic Diam: 1.60 cm Dalton McleanMD Electronically signed by Franki Monte Signature Date/Time: 08/03/2022/2:41:22 PM    Final    CT Angio Chest Pulmonary Embolism (PE) W or WO Contrast  Result Date: 08/03/2022 CLINICAL DATA:  Pulmonary embolism suspected, positive D-dimer. Undergoing  malignancy workup with recent biopsy suggesting lymphoma. EXAM: CT ANGIOGRAPHY CHEST WITH CONTRAST TECHNIQUE: Multidetector CT imaging of the chest was performed using the standard protocol during bolus administration of intravenous contrast. Multiplanar CT image reconstructions and MIPs were obtained to evaluate the vascular anatomy. RADIATION DOSE REDUCTION: This exam was performed according to the departmental dose-optimization program which includes automated exposure control, adjustment of the mA and/or kV according to patient size and/or use of iterative reconstruction technique. CONTRAST:  52m OMNIPAQUE IOHEXOL 350 MG/ML SOLN COMPARISON:  None Available. FINDINGS: Cardiovascular: The heart is borderline enlarged. There is no pericardial effusion. Scattered coronary artery calcifications are noted. There is atherosclerotic calcification of the aorta without evidence of aneurysm. Pulmonary trunk is normal in caliber. No pulmonary artery filling defect is seen. Examination is limited due to respiratory motion artifact and pulmonary nodules. Mediastinum/Nodes: Multiple enlarged lymph nodes are present in the mediastinum and hilar regions bilaterally. No axillary lymphadenopathy. Thyroid gland, trachea, and esophagus are within normal limits. Lungs/Pleura: Hazy regional ground-glass opacities are present in the lungs bilaterally. There are multiple scattered pulmonary nodules bilaterally, the largest in the right lower lobe measuring 3.7 cm, axial image 110 and in the left lower lobe measuring 4.1 cm, axial image 94. No effusion or pneumothorax. Upper Abdomen: Enlarged lymph nodes are present in the gastrohepatic ligament measuring up to 1.1 cm. No acute abnormality. Musculoskeletal: No acute or suspicious osseous abnormality. Review of the MIP images confirms the above findings. IMPRESSION: 1. No evidence of pulmonary embolism. 2. Multiple enlarged mediastinal and hilar lymph nodes and pulmonary nodules  bilaterally measuring up to 4.1 cm, concerning for neoplastic process or lymphoma. Correlation with recent biopsy results is recommended. 3. Mild hazy geographic ground-glass opacities in the lungs, possible edema or air trapping. 4. Coronary artery calcifications. 5. Aortic atherosclerosis. Electronically Signed   By: LBrett FairyM.D.   On: 08/03/2022 01:10   MR  BRAIN WO CONTRAST  Result Date: 08/02/2022 CLINICAL DATA:  Altered mental status EXAM: MRI HEAD WITHOUT CONTRAST TECHNIQUE: Multiplanar, multiecho pulse sequences of the brain and surrounding structures were obtained without intravenous contrast. COMPARISON:  04/15/2013 FINDINGS: Brain: Numerous, diffuse bilateral foci of abnormal diffusion restriction in a pattern that is most suggestive of embolic shower but could also indicate watershed ischemia. No acute hemorrhage. There is multifocal hyperintense T2-weighted signal within the white matter. Generalized volume loss. The midline structures are normal. Vascular: Major flow voids are preserved. Skull and upper cervical spine: Normal calvarium and skull base. Visualized upper cervical spine and soft tissues are normal. Sinuses/Orbits:No paranasal sinus fluid levels or advanced mucosal thickening. No mastoid or middle ear effusion. Normal orbits. IMPRESSION: 1. Numerous, diffuse bilateral foci of abnormal diffusion restriction in a pattern that is most suggestive of embolic shower but could also indicate watershed ischemia. 2. No acute hemorrhage or mass effect. Electronically Signed   By: Ulyses Jarred M.D.   On: 08/02/2022 23:57   CT Head Wo Contrast  Result Date: 08/02/2022 CLINICAL DATA:  Altered mental status, recent diagnosis of lymphoma EXAM: CT HEAD WITHOUT CONTRAST TECHNIQUE: Contiguous axial images were obtained from the base of the skull through the vertex without intravenous contrast. RADIATION DOSE REDUCTION: This exam was performed according to the departmental dose-optimization  program which includes automated exposure control, adjustment of the mA and/or kV according to patient size and/or use of iterative reconstruction technique. COMPARISON:  MR brain done on 04/15/2013 FINDINGS: Brain: No acute intracranial findings are seen in noncontrast CT brain. There are no signs of bleeding within the cranium. Ventricles are not dilated. Cortical sulci are prominent. There is no focal edema or mass effect. Vascular: Unremarkable Skull: Unremarkable. Sinuses/Orbits: Unremarkable. Other: None. IMPRESSION: No acute intracranial findings are seen in noncontrast CT brain. Atrophy. Electronically Signed   By: Elmer Picker M.D.   On: 08/02/2022 16:35   DG Chest 2 View  Result Date: 08/02/2022 CLINICAL DATA:  Cough, altered mental status x3 days EXAM: CHEST - 2 VIEW COMPARISON:  04/21/2021 FINDINGS: Cardiac size is within normal limits. Thoracic aorta is tortuous. There are nodular densities of varying sizes scattered throughout both lungs largest overlying the inferior right hilum measuring 2.8 cm. Findings suggest possible metastatic disease. Less likely possibility would be nodular appearing multifocal pneumonia. There is no pleural effusion or pneumothorax. Degenerative changes are noted in both shoulders. IMPRESSION: There are multiple nodular densities of varying sizes in both lungs suggesting pulmonary metastatic disease. Follow-up CT, PET-CT and biopsy as warranted should be considered. Please correlate with clinical history for any known primary malignancy. Electronically Signed   By: Elmer Picker M.D.   On: 08/02/2022 15:37     PHYSICAL EXAM  Temp:  [97.8 F (36.6 C)-98.1 F (36.7 C)] 98 F (36.7 C) (10/17 1806) Pulse Rate:  [88-100] 100 (10/17 1806) Resp:  [19-22] 22 (10/17 1806) BP: (111-129)/(59-78) 118/67 (10/17 1806) SpO2:  [94 %-95 %] 95 % (10/17 1806) Weight:  [82.8 kg] 82.8 kg (10/17 0429)  General - Well nourished, well developed, mild  tachypnea.  Ophthalmologic - fundi not visualized due to noncooperation.  Cardiovascular - Regular rhythm and rate.  Neuro - eyes open spontaneously. Eyes midline, doll's eyes present, not tracking bilaterally, not blinking to visual threat bilaterally. With repetitive asking, she was mute today and not following any other commands. No facial droop. Tongue protrusion not cooperative. Withdraw to pain in all limbs, LLE stronger than other limbs. Sensation,  coordination and gait not tested.   ASSESSMENT/PLAN Ms. MANDISA PERSINGER is a 66 y.o. female with history of recent diagnosed lymphoma, recent MRSA infection admitted for altered mental status, lethargy and not talking. No tPA given due to outside window.    Stroke:  bilateral embolic shower, etiology not quite clear, however could be due to distal DVT in the setting of small PFO or viscosity of blood from lymphoma with significant leukocytosis CT no acute abnormality MRI 12/75 embolic shower bilaterally CTA chest no PE CT head and neck and CTV unremarkable MRI 10/13 extension of embolic shower bilaterally 2D Echo EF 60 to 65% LE venous Doppler left peroneal vein DVT TEE showed small PFO LDL 91 HgbA1c 6.0 Ammonia level normal Heparin IV for VTE prophylaxis No antithrombotic prior to admission, now on heparin IV.  On amantadine 200 bid for arousal/mental status  Ongoing aggressive stroke risk factor management Therapy recommendations: Pending Disposition: Pending  Hypertension Stable Long term BP goal normotensive  Hyperlipidemia Home meds: Zetia LDL 91, goal < 70 Intolerance to statin Continue zetia at discharge  Fever Tmax 101.4->afebrile Tachycardia and tachypnea now resolved  Intermittent coughing Blood culture negative, urine culture showed pansensitive E. Coli On Rocephin, Flagyl and vancomycin  Dysphagia Did not pass swallow n.p.o. now Speech on board cortrak placed for nutrition  PFO LE DVT Small PFO on  TEE LE venous Doppler left peroneal vein DVT On heparin IV  Lymphoma Recent diagnosis at [facility] Dr. Lorenso Courier on board WBC 48.8->48.2->46.3->55.1->63.8->64.6->65.0 Viscosity of the blood can be part of the stroke etiology  Other Stroke Risk Factors Advanced age  Other Active Problems Recent MRSA infection, on vancomycin  Hospital day # 6    Rosalin Hawking, MD PhD Stroke Neurology 08/09/2022 7:16 PM    To contact Stroke Continuity provider, please refer to [email or web address] After hours, contact General Neurology

## 2022-08-09 NOTE — Progress Notes (Signed)
Inpatient Rehabilitation Admissions Coordinator   Patient not yet demonstrated level to tolerate CIR level rehab. We continue to follow at a distance.  Danne Baxter, RN, MSN Rehab Admissions Coordinator [phone] 08/09/2022 4:37 PM

## 2022-08-09 NOTE — Progress Notes (Signed)
Progress Note  Patient: Sarah Walsh GYF:[id number] DOB: [birth year]  DOA: 08/02/2022  DOS: 08/09/2022    Brief hospital course: DEASHIA SOULE is a 66 y.o. female with a history of T2DM, HLD, HTN, recent cervical LN biopsy with +lymphoma reportedly T cell, not yet started on treatment, presented to ED 10/10 with lethargy, confusion, anorexia, gait instability. MRI brain was positive for scattered cortical and subcortical infarcts. She was admitted, also noted to have fevers, WBC 91.6B with eosinophilic predominance.   Repeat MRI due to worsening level of consciousness showed extension of embolic shower strokes. EEG nonspecific without seizure recorded. Broad IV antibiotics have been continues empirically though no nidus of infection has been identified. Goals of care discussions are ongoing. Palliative care is consulted to assist.  Assessment and Plan: Sepsis: Patient met sepsis criteria on admission with tachycardia, fevers, leukocytosis, lactic acidosis, source unclear.   - Follow blood cultures. Note PCT is undetectable x2. DDx fever includes due to lymphoma, central, less likely DVT in and of itself. - With negative cultures, completed ceftriaxone 2g q24h, vancomycin, flagyl x7 days. - Fungitell negative - Scheduled tylenol - Fever curve improved   Acute metabolic encephalopathy: Most likely related to widespread embolic CVAs.  - EEG with nonspecific changes without seizure activity noted initially. EEG repeated and unchanged 10/15. Anticipate some waxing/waning.  - Amantadine started per neurology for arousal - Limited neurological recovery at this time. Based on discussions with neurology, recovery is   Acute hypoxic respiratory failure: With opacities on CT, we've been covering w/broad abx which would cover pneumonia. CXR read as increasing volume but exam remains stable and we are continuing IVF to match insensible losses. VBG without respiratory acidosis.  - Support with supplemental  oxygen. Stabilized. - She's maintaining airway at this time. Is currently full code, though palliative care discussions are ongoing.   Extensive bilateral cortical and subcortical ischemic CVAs: MRI brain showed numerous diffuse bilateral foci of abnormal diffusion restriction in a pattern most suggestive of embolic shower but could also indicate watershed ischemia. Due to worsening lethargy and exam, repeat MRI ordered today and confirms worsening severity of infarct burden.  - PFO found on TEE. In conjunction with know DVT (albeit distal) and embolic distribution, started heparin, goal level 0.3-0.5.  - Neuro checks to continue - Cortrak inserted 10/16, once up to goal rate and tolerating, can DC IVF. - We are pursing palliative care discussions with the family daily. They are very involved and have a realistic understanding of expected poor prognosis, though it is ultimate status is yet to be determined. Will consult palliative for ongoing discussions.    Dark urine: Due to bilirubinuria due to lymphoma. No WBCs on repeat UA.   Metabolic acidosis: No anion gap. Resolved.  Hypokalemia:  - Supplement in IVF and continue monitoring.  PSVT: Burden decreased on repeat tele review today.  - Convert IV to enteral metoprolol. BP tolerating.   HTN:  - BP goal per neurology, prn hydralazine ordered   Acute RLE peroneal DVT - CTA negative for PE.  - Heparin as discussed above.  - Interestingly, per my discussion with heme/onc, Dr. Lorenso Courier, lymphomas are not classically associated with hypercoagulability. The severity of leukocytosis is also not near the expected threshold to cause leukostasis.    T-cell lymphoma: Recent diagnosis, has not been started on treatment yet.   - Oncology, Dr. Lorenso Courier consulted. Of course the patient is not currently a candidate for treatment and, given her normal cell counts otherwise,  doubt there'd be any benefit to pulsed steroids at this time. There would certainly be  risks as well. The family is aware of this discussion. - There is no further work up or treatment currently planned as an inpatient for this issue.    Eosinophilic leukocytosis:  - I've got to think this is related to her T cell lymphoma. Note subtypes of T cell lymphomas can aberrantly express IL-5.  - Work up has not suggested nephritis, hepatitis, She has no history of this prior to diagnosis, no ongoing drug/allergic reaction, no parasitic infection detected. Could be expected at the time of her drug eruption after Tx for MRSA recently though that agent was stopped and rash has resolved durably. HIV NR - Will plan to monitor CBC w/differential. Note stable eosinophilia, with normal PMNs today.  Subjective: Does not track or follow commands today, worse exam than yesterday, but has waxed and waned at times. Tolerating TFs. Afebrile.  Objective: Vitals:   08/09/22 0429 08/09/22 0757 08/09/22 0800 08/09/22 1149  BP: (!) 111/59 118/70 118/70 123/70  Pulse: 99 94    Resp: (!) 22 (!) 21 (!) 22 (!) 22  Temp: 97.8 F (36.6 C) 97.8 F (36.6 C)    TempSrc: Axillary Axillary    SpO2: 94% 94% 94% 94%  Weight: 82.8 kg     Height:       Gen: 66 y.o. female in no distress with HOB up staring absently.  Pulm: Nonlabored breathing 3L O2. Clear. CV: Regular rate and rhythm. No murmur, rub, or gallop. No JVD, no dependent edema. GI: Abdomen soft, non-tender, non-distended, with normoactive bowel sounds.  Ext: Warm, no deformities Skin: No new rashes, lesions or ulcers on visualized skin. Neuro: Does not move feet to command, much less withdrawal to noxious stimuli in feet bilaterally today. No volitional movement elsewhere. +Blink to threat. Pupils constricted, equal, reactive. No tremors.  Data Personally reviewed: CBC: Recent Labs  Lab 08/06/22 0133 08/07/22 0218 08/07/22 0618 08/08/22 0152 08/09/22 0458  WBC 55.1* 54.5* 63.8* 64.6* 65.0*  NEUTROABS  --   --  13.4* 7.7 7.8*  HGB 13.1  10.9* 12.9 12.1 12.6  HCT 37.5 30.8* 36.2 35.0* 38.6  MCV 93.5 93.3 93.3 95.1 98.5  PLT 196 181 208 208 161   Basic Metabolic Panel: Recent Labs  Lab 08/02/22 1902 08/03/22 0410 08/04/22 0140 08/05/22 0314 08/06/22 0133 08/07/22 0618 08/08/22 0152 08/08/22 1701 08/09/22 0458  NA  --  134*   < > 133* 136 136 139  --  138  K  --  3.4*   < > 3.2* 3.2* 3.2* 3.3*  --  3.6  CL  --  100   < > 104 107 104 103  --  103  CO2  --  23   < > 15* 18* 23 23  --  24  GLUCOSE  --  136*   < > 157* 144* 153* 124*  --  173*  BUN  --  7*   < > _0 --  18  CREATININE  --  0.66   < > 0.87 0.72 0.68 0.64  --  0.63  CALCIUM  --  8.2*   < > 8.0* 7.9* 7.8* 8.0*  --  8.1*  MG  --  1.9  --   --   --   --   --  2.1 2.1  PHOS 2.6  --   --   --   --   --   --  1.9* 1.9*   < > = values in this interval not displayed.   GFR: Estimated Creatinine Clearance: 68.6 mL/min (by C-G formula based on SCr of 0.63 mg/dL). Liver Function Tests: Recent Labs  Lab 08/09/22 0458  AST 18  ALT 11  ALKPHOS 76  BILITOT 0.1*  PROT 5.7*  ALBUMIN 2.1*   No results for input(s): "LIPASE", "AMYLASE" in the last 168 hours. Recent Labs  Lab 08/02/22 1902  AMMONIA 29   Coagulation Profile: Recent Labs  Lab 08/02/22 1902  INR 1.3*  1.2   Cardiac Enzymes: No results for input(s): "CKTOTAL", "CKMB", "CKMBINDEX", "TROPONINI" in the last 168 hours. BNP (last 3 results) No results for input(s): "PROBNP" in the last 8760 hours. HbA1C: No results for input(s): "HGBA1C" in the last 72 hours. CBG: Recent Labs  Lab 08/08/22 2005 08/09/22 0011 08/09/22 0428 08/09/22 0800 08/09/22 1147  GLUCAP 156* 162* 181* 176* 195*   Lipid Profile: No results for input(s): "CHOL", "HDL", "LDLCALC", "TRIG", "CHOLHDL", "LDLDIRECT" in the last 72 hours. Thyroid Function Tests: No results for input(s): "TSH", "T4TOTAL", "FREET4", "T3FREE", "THYROIDAB" in the last 72 hours. Anemia Panel: No results for input(s):  "VITAMINB12", "FOLATE", "FERRITIN", "TIBC", "IRON", "RETICCTPCT" in the last 72 hours. Urine analysis:    Component Value Date/Time   COLORURINE AMBER (A) 08/08/2022 1415   APPEARANCEUR HAZY (A) 08/08/2022 1415   LABSPEC 1.042 (H) 08/08/2022 1415   PHURINE 5.0 08/08/2022 1415   GLUCOSEU 50 (A) 08/08/2022 1415   HGBUR NEGATIVE 08/08/2022 1415   BILIRUBINUR MODERATE (A) 08/08/2022 1415   KETONESUR 80 (A) 08/08/2022 1415   PROTEINUR 100 (A) 08/08/2022 1415   NITRITE NEGATIVE 08/08/2022 1415   LEUKOCYTESUR TRACE (A) 08/08/2022 1415   Recent Results (from the past 240 hour(s))  Resp Panel by RT-PCR (Flu A&B, Covid) Anterior Nasal Swab     Status: None   Collection Time: 08/02/22  3:10 PM   Specimen: Anterior Nasal Swab  Result Value Ref Range Status   SARS Coronavirus 2 by RT PCR NEGATIVE NEGATIVE Final    Comment: (NOTE) SARS-CoV-2 target nucleic acids are NOT DETECTED.  The SARS-CoV-2 RNA is generally detectable in upper respiratory specimens during the acute phase of infection. The lowest concentration of SARS-CoV-2 viral copies this assay can detect is 138 copies/mL. A negative result does not preclude SARS-Cov-2 infection and should not be used as the sole basis for treatment or other patient management decisions. A negative result may occur with  improper specimen collection/handling, submission of specimen other than nasopharyngeal swab, presence of viral mutation(s) within the areas targeted by this assay, and inadequate number of viral copies(<138 copies/mL). A negative result must be combined with clinical observations, patient history, and epidemiological information. The expected result is Negative.  Fact Sheet for Patients:  EntrepreneurPulse.com.au  Fact Sheet for Healthcare Providers:  IncredibleEmployment.be  This test is no t yet approved or cleared by the [place] FDA and  has been authorized for detection and/or  diagnosis of SARS-CoV-2 by FDA under an Emergency Use Authorization (EUA). This EUA will remain  in effect (meaning this test can be used) for the duration of the COVID-19 declaration under Section 564(b)(1) of the Act, 21 U.S.C.section 360bbb-3(b)(1), unless the authorization is terminated  or revoked sooner.       Influenza A by PCR NEGATIVE NEGATIVE Final   Influenza B by PCR NEGATIVE NEGATIVE Final    Comment: (NOTE) The Xpert Xpress SARS-CoV-2/FLU/RSV plus assay is intended as an aid in the  diagnosis of influenza from Nasopharyngeal swab specimens and should not be used as a sole basis for treatment. Nasal washings and aspirates are unacceptable for Xpert Xpress SARS-CoV-2/FLU/RSV testing.  Fact Sheet for Patients: EntrepreneurPulse.com.au  Fact Sheet for Healthcare Providers: IncredibleEmployment.be  This test is not yet approved or cleared by the [place] FDA and has been authorized for detection and/or diagnosis of SARS-CoV-2 by FDA under an Emergency Use Authorization (EUA). This EUA will remain in effect (meaning this test can be used) for the duration of the COVID-19 declaration under Section 564(b)(1) of the Act, 21 U.S.C. section 360bbb-3(b)(1), unless the authorization is terminated or revoked.  Performed at [facility], [place] [address], [place], [place] [address]   Blood culture (routine x 2)     Status: None   Collection Time: 08/02/22  3:16 PM   Specimen: BLOOD  Result Value Ref Range Status   Specimen Description BLOOD LEFT ANTECUBITAL  Final   Special Requests   Final    BOTTLES DRAWN AEROBIC AND ANAEROBIC Blood Culture results may not be optimal due to an inadequate volume of blood received in culture bottles   Culture   Final    NO GROWTH 5 DAYS Performed at [facility], [place] [address] [place], [place] [address]    Report Status 08/07/2022 FINAL  Final  Blood culture (routine x 2)     Status:  None   Collection Time: 08/02/22  7:02 PM   Specimen: BLOOD  Result Value Ref Range Status   Specimen Description BLOOD LEFT ANTECUBITAL  Final   Special Requests   Final    BOTTLES DRAWN AEROBIC AND ANAEROBIC Blood Culture adequate volume   Culture   Final    NO GROWTH 5 DAYS Performed at [facility], [place] [address] [place], [place] [address]    Report Status 08/07/2022 FINAL  Final  Urine Culture     Status: Abnormal   Collection Time: 08/02/22  9:37 PM   Specimen: Urine, Clean Catch  Result Value Ref Range Status   Specimen Description URINE, CLEAN CATCH  Final   Special Requests   Final    NONE Performed at [facility], [place] [address] [place], [place] [address]    Culture 10,000 COLONIES/mL ESCHERICHIA COLI (A)  Final   Report Status 08/05/2022 FINAL  Final   Organism ID, Bacteria ESCHERICHIA COLI (A)  Final      Susceptibility   Escherichia coli - MIC*    AMPICILLIN 8 SENSITIVE Sensitive     CEFAZOLIN <=4 SENSITIVE Sensitive     CEFEPIME <=0.12 SENSITIVE Sensitive     CEFTRIAXONE <=0.25 SENSITIVE Sensitive     CIPROFLOXACIN <=0.25 SENSITIVE Sensitive     GENTAMICIN <=1 SENSITIVE Sensitive     IMIPENEM <=0.25 SENSITIVE Sensitive     NITROFURANTOIN <=16 SENSITIVE Sensitive     TRIMETH/SULFA <=20 SENSITIVE Sensitive     AMPICILLIN/SULBACTAM 4 SENSITIVE Sensitive     PIP/TAZO <=4 SENSITIVE Sensitive     * 10,000 COLONIES/mL ESCHERICHIA COLI     DG Abd Portable 1V  Result Date: 08/08/2022 CLINICAL DATA:  Feeding tube placement EXAM: PORTABLE ABDOMEN - 1 VIEW COMPARISON:  CT abdomen 05/29/2006 FINDINGS: Partially visualized bilateral pulmonary nodules. Bilateral hilar lymphadenopathy. Feeding tube with the tip projecting over the antrum of the stomach. No bowel dilatation to suggest obstruction. No evidence of pneumoperitoneum, portal venous gas or pneumatosis. No pathologic calcifications along the expected course of the ureters.  No acute osseous abnormality.  IMPRESSION: 1. Feeding tube with the tip projecting over the antrum of the stomach. 2. Redemonstrated bilateral hilar lymphadenopathy and bilateral pulmonary nodules. Electronically Signed   By: Kathreen Devoid M.D.   On: 08/08/2022 13:00    Family Communication: Ex-husband, Son at bedside  Disposition: Status is: Inpatient Remains inpatient appropriate because: Critical illness Planned Discharge Destination:  TBD, guarded prognosis  Patrecia Pour, MD 08/09/2022 4:14 PM Page by [email or web address]

## 2022-08-10 ENCOUNTER — Inpatient Hospital Stay (HOSPITAL_COMMUNITY): Payer: Medicare HMO

## 2022-08-10 DIAGNOSIS — G9340 Encephalopathy, unspecified: Secondary | ICD-10-CM | POA: Diagnosis not present

## 2022-08-10 DIAGNOSIS — A4102 Sepsis due to Methicillin resistant Staphylococcus aureus: Secondary | ICD-10-CM | POA: Diagnosis not present

## 2022-08-10 DIAGNOSIS — I82452 Acute embolism and thrombosis of left peroneal vein: Secondary | ICD-10-CM | POA: Diagnosis not present

## 2022-08-10 DIAGNOSIS — C8448 Peripheral T-cell lymphoma, not classified, lymph nodes of multiple sites: Secondary | ICD-10-CM | POA: Diagnosis not present

## 2022-08-10 DIAGNOSIS — J9601 Acute respiratory failure with hypoxia: Secondary | ICD-10-CM

## 2022-08-10 DIAGNOSIS — G9341 Metabolic encephalopathy: Secondary | ICD-10-CM | POA: Diagnosis not present

## 2022-08-10 DIAGNOSIS — I639 Cerebral infarction, unspecified: Secondary | ICD-10-CM | POA: Diagnosis not present

## 2022-08-10 LAB — BLOOD GAS, ARTERIAL
Acid-Base Excess: 4.6 mmol/L — ABNORMAL HIGH (ref 0.0–2.0)
Bicarbonate: 28.3 mmol/L — ABNORMAL HIGH (ref 20.0–28.0)
O2 Saturation: 92.9 %
Patient temperature: 37.5
pCO2 arterial: 39 mmHg (ref 32–48)
pH, Arterial: 7.47 — ABNORMAL HIGH (ref 7.35–7.45)
pO2, Arterial: 65 mmHg — ABNORMAL LOW (ref 83–108)

## 2022-08-10 LAB — CBC WITH DIFFERENTIAL/PLATELET
Abs Immature Granulocytes: 0 10*3/uL (ref 0.00–0.07)
Basophils Absolute: 0 10*3/uL (ref 0.0–0.1)
Basophils Relative: 0 %
Eosinophils Absolute: 57.4 10*3/uL — ABNORMAL HIGH (ref 0.0–0.5)
Eosinophils Relative: 84 %
HCT: 35.6 % — ABNORMAL LOW (ref 36.0–46.0)
Hemoglobin: 11.9 g/dL — ABNORMAL LOW (ref 12.0–15.0)
Lymphocytes Relative: 1 %
Lymphs Abs: 0.7 10*3/uL (ref 0.7–4.0)
MCH: 32.6 pg (ref 26.0–34.0)
MCHC: 33.4 g/dL (ref 30.0–36.0)
MCV: 97.5 fL (ref 80.0–100.0)
Monocytes Absolute: 0.7 10*3/uL (ref 0.1–1.0)
Monocytes Relative: 1 %
Neutro Abs: 9.6 10*3/uL — ABNORMAL HIGH (ref 1.7–7.7)
Neutrophils Relative %: 14 %
Platelets: 241 10*3/uL (ref 150–400)
RBC: 3.65 MIL/uL — ABNORMAL LOW (ref 3.87–5.11)
RDW: 17.2 % — ABNORMAL HIGH (ref 11.5–15.5)
WBC: 68.3 10*3/uL (ref 4.0–10.5)
nRBC: 0 % (ref 0.0–0.2)
nRBC: 1 /100 WBC — ABNORMAL HIGH

## 2022-08-10 LAB — BASIC METABOLIC PANEL
Anion gap: 7 (ref 5–15)
BUN: 16 mg/dL (ref 8–23)
CO2: 26 mmol/L (ref 22–32)
Calcium: 8 mg/dL — ABNORMAL LOW (ref 8.9–10.3)
Chloride: 104 mmol/L (ref 98–111)
Creatinine, Ser: 0.62 mg/dL (ref 0.44–1.00)
GFR, Estimated: >60 mL/min (ref >60)
Glucose, Bld: 206 mg/dL — ABNORMAL HIGH (ref 70–99)
Potassium: 3.5 mmol/L (ref 3.5–5.1)
Sodium: 137 mmol/L (ref 135–145)

## 2022-08-10 LAB — GLUCOSE, CAPILLARY
Glucose-Capillary: 163 mg/dL — ABNORMAL HIGH (ref 70–99)
Glucose-Capillary: 176 mg/dL — ABNORMAL HIGH (ref 70–99)
Glucose-Capillary: 193 mg/dL — ABNORMAL HIGH (ref 70–99)
Glucose-Capillary: 195 mg/dL — ABNORMAL HIGH (ref 70–99)
Glucose-Capillary: 194 mg/dL — ABNORMAL HIGH (ref 70–99)

## 2022-08-10 LAB — BRAIN NATRIURETIC PEPTIDE: B Natriuretic Peptide: 575 pg/mL — ABNORMAL HIGH (ref 0.0–100.0)

## 2022-08-10 LAB — MAGNESIUM: Magnesium: 2.1 mg/dL (ref 1.7–2.4)

## 2022-08-10 LAB — HEPARIN LEVEL (UNFRACTIONATED): Heparin Unfractionated: 0.64 IU/mL (ref 0.30–0.70)

## 2022-08-10 LAB — PHOSPHORUS: Phosphorus: 1.9 mg/dL — ABNORMAL LOW (ref 2.5–4.6)

## 2022-08-10 MED ORDER — METHYLPREDNISOLONE SODIUM SUCC 40 MG IJ SOLR
40.0000 mg | Freq: Once | INTRAMUSCULAR | Status: DC
  Filled 2022-08-10: qty 1

## 2022-08-10 MED ORDER — FUROSEMIDE 10 MG/ML IJ SOLN
40.0000 mg | Freq: Once | INTRAMUSCULAR | Status: AC
  Administered 2022-08-10: 40 mg via INTRAVENOUS
  Filled 2022-08-10: qty 4

## 2022-08-10 MED ORDER — POTASSIUM CHLORIDE 10 MEQ/100ML IV SOLN
10.0000 meq | INTRAVENOUS | Status: AC
  Administered 2022-08-10 – 2022-08-11 (×4): 10 meq via INTRAVENOUS
  Filled 2022-08-10 (×4): qty 100

## 2022-08-10 MED ORDER — METHYLPREDNISOLONE SODIUM SUCC 40 MG IJ SOLR
40.0000 mg | Freq: Two times a day (BID) | INTRAMUSCULAR | Status: DC
  Administered 2022-08-10: 40 mg via INTRAVENOUS
  Filled 2022-08-10: qty 1

## 2022-08-10 MED ORDER — ALBUTEROL SULFATE (2.5 MG/3ML) 0.083% IN NEBU
2.5000 mg | INHALATION_SOLUTION | RESPIRATORY_TRACT | Status: DC | PRN
  Administered 2022-08-10: 2.5 mg via RESPIRATORY_TRACT
  Filled 2022-08-10: qty 3

## 2022-08-10 MED ORDER — METHYLPREDNISOLONE SODIUM SUCC 40 MG IJ SOLR
40.0000 mg | Freq: Every day | INTRAMUSCULAR | Status: DC
  Administered 2022-08-11: 40 mg via INTRAVENOUS
  Filled 2022-08-10: qty 1

## 2022-08-10 MED ORDER — IPRATROPIUM-ALBUTEROL 0.5-2.5 (3) MG/3ML IN SOLN
3.0000 mL | Freq: Four times a day (QID) | RESPIRATORY_TRACT | Status: DC
  Administered 2022-08-10 – 2022-08-11 (×3): 3 mL via RESPIRATORY_TRACT
  Filled 2022-08-10 (×3): qty 3

## 2022-08-10 NOTE — Consult Note (Signed)
NAME:  Sarah Walsh, MRN:  [id number], DOB:  1956/09/12, LOS: 7 ADMISSION DATE:  08/02/2022 CONSULTATION DATE:  08/10/2022 REFERRING MD:  Florene Glen - TR[facility] CHIEF COMPLAINT:  Respiratory failure, AMS  History of Present Illness:  66 year old woman who presented to [facility] 10/10 with AMS x 4 days. PMHx significant for HTN, HLD, T2DM, anemia, arthritis, remote history of melanoma, recent diagnosis of T-cell lymphoma (s/p cervical excisional LN biopsy, followed at [facility]). Per patient's daughter, more confused over several days PTA with balance issues and c/o diffuse pruritus x several weeks.  ED workup 10/10 was notable for fever to Tmax 38.2C and severe leukocytosis with WBC 54 and eosinophilia, LA 2.1. CT Head with NAICA; CXR with multiple nodules c/f metastatic disease. Broad-spectrum antibiotics were initiated and Oncology was consulted.   MRI Brain 10/10 with numerous diffuse bilateral foci of abnormal diffusion restriction with pattern c/f embolic shower versus watershed ischemia. Neurology was consulted. CTA Chest was completed 10/11 due to concern for PE and was negative for PE; demonstrated multiple mediastinal/hilar LN/pulmonary nodules up to 4.1cm c/f neoplastic process/lymphoma. LE dopplers demonstrated acute DVT in the peroneal veins of the L calf. CTA Head/Neck 10/12 with no LVO/significant stenosis, concern for dural venous sinus thrombosis; however CT Venogram negative. Repeat MRI 10/13 with demonstration of extensive acute infarcts in the cerebral/cerebellar hemispheres, favored embolic, increased from prior. TEE was completed 10/13 showing small PFO, no thrombus or vegetation. IV Heparin was started. Given ongoing encephalopathy, EEG completed 10/14 with triphasic GPDs, more indicative of toxic metabolic causes, no seizures. Antibiotics were narrowed to ceftriaxone/flagyl/vanc and cefepime discontinued given concern for lowering seizure threshold. Bicarb gtt was started and subsequently  discontinued after concern for pulmonary edema. Cortrak was placed 10/16 for nutritional support. Given poor neurologic improvement in the setting of lymphoma with likely metastatic disease, PMT consulted 10/18 with plan for family [place] discussion 10/19.  On 10/18PM, patient experienced increased WOB with mild tachypnea requiring BiPAP initiation. PCCM consulted for respiratory failure and possible need for airway.  Pertinent Medical History:   Past Medical History:  Diagnosis Date   Anemia    Arthritis    bilateral knees   Colon polyp 05/29/2019   Depression    on meds-situational anxiety   Diabetes mellitus without complication ([place])    on meds   Hyperlipemia    on meds   Hypertension    hx of    Osteopenia 04/23/2021   DEXA 03/2021: lowest T = -2.0, osteeopenia. Recheck 2 years. [facility] Events: Including procedures, antibiotic start and stop dates in addition to other pertinent events   10/10 - Admitted with AMS, fever, leukocytosis to 54.6. CXR with multiple nodules c/f metastatic disease. CT Head NAICA. MRI Brain with numerous diffuse bilateral foci of abnormal diffusion restriction with pattern c/f embolic shower versus watershed ischemia. 10/11 - CTA Chest negative for PE; demonstrated multiple mediastinal/hilar LN/pulmonary nodules up to 4.1cm c/f neoplastic process/lymphoma. LE Dopplers with acute DVT L peroneal veins. 10/12 - CTA Head/Neck with no LVO/significant stenosis, concern for dural venous sinus thrombosis; however CT Venogram negative. 10/13 - Repeat MRI with demonstration of extensive acute infarcts in the cerebral/cerebellar hemispheres, favored embolic, increased from prior. TEE showing small PFO, no thrombus or vegetation. IV Heparin was started. 10/14 - EEG with triphasic GPDs, more indicative of toxic metabolic causes, no seizures. Antibiotics narrowed; ceftriaxone/flagyl/vanc. Cefepime discontinued given concern for lowering seizure threshold.  Bicarb gtt was started and subsequently discontinued after concern for  pulmonary edema. 10/16 - Cortrak placed for nutritional support 10/18 - PCCM consulted for respiratory failure. PMT consulted for [place] discussions.  Interim History / Subjective:  PCCM consulted for respiratory failure  Objective:  Blood pressure 124/71, pulse 91, temperature 99.5 F (37.5 C), temperature source Axillary, resp. rate 18, height _0  (1.6 m), weight 78.5 kg, SpO2 94 %.    FiO2 (%):  [40 %] 40 %   Intake/Output Summary (Last 24 hours) at 08/10/2022 1608 Last data filed at 08/10/2022 0348 Gross per 24 hour  Intake 1322.19 ml  Output --  Net 1322.19 ml   Filed Weights   08/02/22 1500 08/09/22 0429 08/10/22 0449  Weight: 72.6 kg 82.8 kg 78.5 kg   Physical Examination: General: Acutely ill-appearing late middle-aged woman in NAD. HEENT: [place]/AT, anicteric sclera, PERRL, moist mucous membranes. Neuro:  Drowsy; opens eyes to voice but minimally interactive.   Responds to verbal stimuli. Not following commands.  CV: RRR, no m/g/r. PULM: Breathing even and mildly labored on BiPAP. Lung fields with fine bibasilar crackles. GI: Soft, nontender, nondistended. Extremities: Bilateral symmetric 1+ LE edema noted. Skin: Warm/dry, no rashes.  Resolved Hospital Problem List:    Assessment & Plan:  Acute respiratory failure with hypoxia Multifocal pneumonia: Completed course of rocephin, vanc, flagyl x 7 days - Continue BiPAP - Wean FiO2 for sats >92% - CXR appears unchanged from prior imaging - Lasix 74m IV given ~1400, repeat 1800 - DuoNeb PRN for wheezing - Pulmonary hygiene - Follow CXR - Evaluated for intubation; does not appear to imminently need intubation at this time; however remains HIGH RISK for intubation if mental status worsens - PCCM attending (Dr. CTacy Learn discussed possible intubation with family via phone 10/18PM; they will discuss amongst themselves and call back to determine if  intubation would be acceptable should patient decompensate further - PMT consulted 10/18 with plan for GMarquezdiscussion 10/19 in the setting of lymphoma diagnosis with pulmonary involvement and ?prognosis   RLE DVT PFO CTA Chest 10/11 negative for PE; TEE 10/13 with small PFO, no vegetation/thrombus. - Continue heparin gtt - Telemetry monitoring  Extensive bilateral cortical and subcortical ischemic CVAs  Acute metabolic encephalopathy  MRI 10/10 with numerous diffuse bilateral foci of abnormal diffusion restriction with pattern c/f embolic shower versus watershed ischemia; Repeat 10/13 demonstration of extensive acute infarcts in the cerebral/cerebellar hemispheres, favored embolic, increased from prior. - Neuro/Stroke following - Frequent neuro checks - Currently protecting airway, but high risk for intubation if mental status worsens   T-cell lymphoma Eosinophilic leukocytosis B/l pulmonary nodules [facility] Reportedly had recent cervical LN excisional biopsy with path demonstrating T-cell lymphoma. F/u with Onc 10/13 missed due to hospitalization. - No role for steroids, per oncology - [facility]   Sepsis HTN HLD PSVT Hypokalemia Metabolic acidosis Dark urine - Per Primary Team  Best Practice: (right click and "Reselect all SmartList Selections" daily)   Per Primary Team  Labs:  CBC: Recent Labs  Lab 08/07/22 0218 08/07/22 0618 08/08/22 0152 08/09/22 0458 08/10/22 0525  WBC 54.5* 63.8* 64.6* 65.0* 68.3*  NEUTROABS  --  13.4* 7.7 7.8* 9.6*  HGB 10.9* 12.9 12.1 12.6 11.9*  HCT 30.8* 36.2 35.0* 38.6 35.6*  MCV 93.3 93.3 95.1 98.5 97.5  PLT 181 208 208 221 2710  Basic Metabolic Panel: Recent Labs  Lab 08/06/22 0133 08/07/22 0618 08/08/22 0152 08/08/22 1701 08/09/22 0458 08/09/22 1606 08/10/22 0525  NA 136  136 139  --  138  --  137  K 3.2* 3.2* 3.3*  --  3.6  --  3.5  CL 107 104 103   --  103  --  104  CO2 18* 23 23  --  24  --  26  GLUCOSE 144* 153* 124*  --  173*  --  206*  BUN _0 --  18  --  16  CREATININE 0.72 0.68 0.64  --  0.63  --  0.62  CALCIUM 7.9* 7.8* 8.0*  --  8.1*  --  8.0*  MG  --   --   --  2.1 2.1 2.1 2.1  PHOS  --   --   --  1.9* 1.9* 2.0* 1.9*   GFR: Estimated Creatinine Clearance: 66.7 mL/min (by C-G formula based on SCr of 0.62 mg/dL). Recent Labs  Lab 08/04/22 0140 08/05/22 0314 08/07/22 0618 08/08/22 0152 08/09/22 0458 08/10/22 0525  PROCALCITON <0.10  --   --   --   --   --   WBC 48.2*   < > 63.8* 64.6* 65.0* 68.3*   < > = values in this interval not displayed.   Liver Function Tests: Recent Labs  Lab 08/09/22 0458  AST 18  ALT 11  ALKPHOS 76  BILITOT 0.1*  PROT 5.7*  ALBUMIN 2.1*   No results for input(s): "LIPASE", "AMYLASE" in the last 168 hours. No results for input(s): "AMMONIA" in the last 168 hours.  ABG:    Component Value Date/Time   PHART 7.47 (H) 08/10/2022 1440   PCO2ART 39 08/10/2022 1440   PO2ART 65 (L) 08/10/2022 1440   HCO3 28.3 (H) 08/10/2022 1440   ACIDBASEDEF 0.3 08/06/2022 1748   O2SAT 92.9 08/10/2022 1440    Coagulation Profile: No results for input(s): "INR", "PROTIME" in the last 168 hours.  Cardiac Enzymes: No results for input(s): "CKTOTAL", "CKMB", "CKMBINDEX", "TROPONINI" in the last 168 hours.  HbA1C: Hemoglobin A1C  Date/Time Value Ref Range Status  09/29/2017 12:00 AM 5.8  Final   Hgb A1c MFr Bld  Date/Time Value Ref Range Status  08/03/2022 09:05 AM 6.0 (H) 4.8 - 5.6 % Final    Comment:    (NOTE) Pre diabetes:          5.7%-6.4%  Diabetes:              >6.4%  Glycemic control for   <7.0% adults with diabetes   04/21/2021 02:23 PM 6.0 (H) 4.8 - 5.6 % Final    Comment:    (NOTE)         Prediabetes: 5.7 - 6.4         Diabetes: >6.4         Glycemic control for adults with diabetes: <7.0    CBG: Recent Labs  Lab 08/09/22 2050 08/10/22 0013 08/10/22 0453  08/10/22 0802 08/10/22 1205  GLUCAP 163* 193* 176* 195* 194*   Review of Systems:   Patient is encephalopathic and/or intubated. Therefore history has been obtained from chart review.   Past Medical History:  She,  has a past medical history of Anemia, Arthritis, Colon polyp (05/29/2019), Depression, Diabetes mellitus without complication ([place]), Hyperlipemia, Hypertension, and Osteopenia (04/23/2021).   Surgical History:   Past Surgical History:  Procedure Laterality Date   ABDOMINAL HYSTERECTOMY     BREAST IMPLANT REMOVAL Bilateral 12/10/2020   Procedure: REMOVAL BREAST IMPLANTS;  Surgeon: Wallace Going, DO;  Location: [place];  Service: Clinical cytogeneticist;  Laterality: Bilateral;  2 hours please   BUBBLE STUDY  08/05/2022   Procedure: BUBBLE STUDY;  Surgeon: Donato Heinz, MD;  Location[facility];  Service: Cardiovascular;;   CAPSULECTOMY Bilateral 12/10/2020   Procedure: CAPSULECTOMY;  Surgeon: Wallace Going, DO;  Location: [facility];  Service: Plastics;  Laterality: Bilateral;   CESAREAN SECTION     4 times   COLONOSCOPY  2020   Gupta-MAC-tics/hems/multiple polyps   POLYPECTOMY  2020   multiple polyps   TEE WITHOUT CARDIOVERSION N/A 08/05/2022   Procedure: TRANSESOPHAGEAL ECHOCARDIOGRAM (TEE);  Surgeon: Donato Heinz, MD;  Location: [place];  Service: Cardiovascular;  Laterality: N/A;   TOTAL KNEE ARTHROPLASTY Left 05/03/2021   Procedure: LEFT TOTAL KNEE ARTHROPLASTY;  Surgeon: Leandrew Koyanagi, MD;  Location: [facility];  Service: Orthopedics;  Laterality: Left;   WISDOM TOOTH EXTRACTION     Social History:   reports that she quit smoking about 38 years ago. Her smoking use included cigarettes. She has never used smokeless tobacco. She reports that she does not drink alcohol and does not use drugs.   Family History:  Her family history includes Alzheimer's disease in her mother; Colon polyps in her father; Diabetes in  her daughter and father; Healthy in her daughter, daughter, daughter, and son. There is no history of Esophageal cancer, Rectal cancer, Stomach cancer, or Colon cancer.   Allergies: Allergies  Allergen Reactions   Statins Other (See Comments)    myalgia Other reaction(s): Other (See Comments) Other Reaction(s): Other  myalgia Other Reaction(s): Other  myalgia    Codeine Itching and Other (See Comments)    Home Medications: Prior to Admission medications   Medication Sig Start Date End Date Taking? Authorizing Provider  Ascorbic Acid (VITAMIN C PO) Take 2,000 mg by mouth daily.   Yes [provider]  B Complex Vitamins (B COMPLEX PO) Take 1 capsule by mouth daily.   Yes [provider]  cetirizine (ZYRTEC ALLERGY) 10 MG tablet Take 1 tablet (10 mg total) by mouth at bedtime. 06/27/22 08/26/22 Yes Lynden Oxford Scales, PA-C  Cholecalciferol 25 MCG (1000 UT) tablet Take 1,000 Units by mouth 2 (two) times daily.   Yes [provider]  diclofenac (VOLTAREN) 75 MG EC tablet Take 75 mg by mouth 2 (two) times daily as needed for pain. 04/16/22  Yes [provider]  diphenhydrAMINE (BENADRYL) 25 MG tablet Take 25 mg by mouth at bedtime.   Yes [provider]  ezetimibe (ZETIA) 10 MG tablet Take 1 tablet by mouth once daily Patient taking differently: Take 10 mg by mouth daily. 06/02/21  Yes Leamon Arnt, MD  FLUoxetine (PROZAC) 10 MG capsule Take 10 mg by mouth daily.   Yes [provider]  gabapentin (NEURONTIN) 300 MG capsule Take 1 capsule by mouth at bedtime Patient taking differently: Take 300 mg by mouth at bedtime. 06/02/21  Yes Leamon Arnt, MD  hydrochlorothiazide (MICROZIDE) 12.5 MG capsule TAKE 1 CAPSULE BY MOUTH ONCE DAILY AS NEEDED Patient taking differently: Take 12.5 mg by mouth daily. 06/02/21  Yes Leamon Arnt, MD  ibuprofen (ADVIL) 200 MG tablet Take 400 mg by mouth every 6 (six) hours as needed for headache or mild  pain.   Yes [provider]  magnesium oxide (MAG-OX) 400 MG tablet Take 400 mg by mouth daily.   Yes [provider]  metFORMIN (GLUCOPHAGE) 500 MG tablet Take 1 tablet (500 mg total) by mouth  2 (two) times daily with a meal. Schedule an appointment for further refills. 04/09/21  Yes Leamon Arnt, MD  montelukast (SINGULAIR) 10 MG tablet Take 1 tablet (10 mg total) by mouth at bedtime. 06/27/22 08/26/22 Yes Lynden Oxford Scales, PA-C  Potassium 99 MG TABS Take 99 mg by mouth daily.   Yes [provider]  Propylene Glycol (SYSTANE COMPLETE) 0.6 % SOLN Place 1 drop into both eyes daily as needed (dry eyes).   Yes [provider]  solifenacin (VESICARE) 5 MG tablet Take 5 mg by mouth daily. 05/29/22  Yes [provider]  triamcinolone cream (KENALOG) 0.1 % Apply 1 Application topically daily as needed for itching. 07/16/22  Yes [provider]    Critical care time: 31 minutes   Lestine Mount, PA-C [facility] 08/10/22 4:08 PM  Please see [email or web address] for pager details.  From 7A-7P if no response, please call [phone] After hours, please call ELink [phone]

## 2022-08-10 NOTE — Progress Notes (Signed)
STROKE TEAM PROGRESS NOTE   SUBJECTIVE (INTERVAL HISTORY) No family is at the bedside.  Patient eyes open, however facial flushing with tachypnea and intermittent cough with low grade fever. She was able to say "what" when I called her name, and said "yah" when I asked her if she was OK. Did not answer other questions, seems intermittently blinking to visual threat and follows commands on foot movement.    OBJECTIVE Temp:  [98 F (36.7 C)-99.8 F (37.7 C)] 99.5 F (37.5 C) (10/18 0809) Pulse Rate:  [88-105] 97 (10/18 0449) Cardiac Rhythm: Sinus tachycardia (10/18 0809) Resp:  [22-25] 24 (10/18 0809) BP: (118-130)/(62-77) 130/73 (10/18 0809) SpO2:  [92 %-97 %] 92 % (10/18 0809) Weight:  [78.5 kg] 78.5 kg (10/18 0449)  Recent Labs  Lab 08/09/22 1652 08/09/22 2050 08/10/22 0013 08/10/22 0453 08/10/22 0802  GLUCAP 195* 163* 193* 176* 195*   Recent Labs  Lab 08/06/22 0133 08/07/22 0618 08/08/22 0152 08/08/22 1701 08/09/22 0458 08/09/22 1606 08/10/22 0525  NA 136 136 139  --  138  --  137  K 3.2* 3.2* 3.3*  --  3.6  --  3.5  CL 107 104 103  --  103  --  104  CO2 18* 23 23  --  24  --  26  GLUCOSE 144* 153* 124*  --  173*  --  206*  BUN '11 14 14  '$ --  18  --  16  CREATININE 0.72 0.68 0.64  --  0.63  --  0.62  CALCIUM 7.9* 7.8* 8.0*  --  8.1*  --  8.0*  MG  --   --   --  2.1 2.1 2.1 2.1  PHOS  --   --   --  1.9* 1.9* 2.0* 1.9*   Recent Labs  Lab 08/09/22 0458  AST 18  ALT 11  ALKPHOS 76  BILITOT 0.1*  PROT 5.7*  ALBUMIN 2.1*   Recent Labs  Lab 08/07/22 0218 08/07/22 0618 08/08/22 0152 08/09/22 0458 08/10/22 0525  WBC 54.5* 63.8* 64.6* 65.0* 68.3*  NEUTROABS  --  13.4* 7.7 7.8* 9.6*  HGB 10.9* 12.9 12.1 12.6 11.9*  HCT 30.8* 36.2 35.0* 38.6 35.6*  MCV 93.3 93.3 95.1 98.5 97.5  PLT 181 208 208 221 241   No results for input(s): "CKTOTAL", "CKMB", "CKMBINDEX", "TROPONINI" in the last 168 hours. No results for input(s): "LABPROT", "INR" in the last 72  hours. Recent Labs    08/08/22 1415  COLORURINE AMBER*  LABSPEC 1.042*  PHURINE 5.0  GLUCOSEU 50*  HGBUR NEGATIVE  BILIRUBINUR MODERATE*  KETONESUR 80*  PROTEINUR 100*  NITRITE NEGATIVE  LEUKOCYTESUR TRACE*       Component Value Date/Time   CHOL 127 08/03/2022 0905   TRIG 97 08/03/2022 0905   HDL 17 (L) 08/03/2022 0905   CHOLHDL 7.5 08/03/2022 0905   VLDL 19 08/03/2022 0905   LDLCALC 91 08/03/2022 0905   Lab Results  Component Value Date   HGBA1C 6.0 (H) 08/03/2022   No results found for: "LABOPIA", "COCAINSCRNUR", "LABBENZ", "AMPHETMU", "THCU", "LABBARB"  No results for input(s): "ETH" in the last 168 hours.  I have personally reviewed the radiological images below and agree with the radiology interpretations.  DG CHEST PORT 1 VIEW  Result Date: 08/10/2022 CLINICAL DATA:  Cough. EXAM: PORTABLE CHEST 1 VIEW COMPARISON:  August 06, 2022. FINDINGS: Stable cardiomediastinal silhouette. Feeding tube is seen entering stomach. Bilateral pulmonary nodules are noted concerning for metastatic disease. Mild left basilar  subsegmental atelectasis is noted with small left pleural effusion. Bony thorax is unremarkable. IMPRESSION: Bilateral pulmonary nodules are again noted concerning for metastatic disease. Mild left basilar subsegmental atelectasis is noted with small left pleural effusion. Electronically Signed   By: Marijo Conception M.D.   On: 08/10/2022 12:38   DG Abd Portable 1V  Result Date: 08/08/2022 CLINICAL DATA:  Feeding tube placement EXAM: PORTABLE ABDOMEN - 1 VIEW COMPARISON:  CT abdomen 05/29/2006 FINDINGS: Partially visualized bilateral pulmonary nodules. Bilateral hilar lymphadenopathy. Feeding tube with the tip projecting over the antrum of the stomach. No bowel dilatation to suggest obstruction. No evidence of pneumoperitoneum, portal venous gas or pneumatosis. No pathologic calcifications along the expected course of the ureters. No acute osseous abnormality.  IMPRESSION: 1. Feeding tube with the tip projecting over the antrum of the stomach. 2. Redemonstrated bilateral hilar lymphadenopathy and bilateral pulmonary nodules. Electronically Signed   By: Kathreen Devoid M.D.   On: 08/08/2022 13:00   EEG adult  Result Date: 08/07/2022 Derek Jack, MD     08/07/2022 12:34 PM [name] MRN: [id number] Epilepsy Attending: Su Monks MD Referring Physician/Provider: Donnetta Simpers, MD Date: 08/07/2022 Duration: 24.09 min  Patient history: 66 year old female with alteration in mental status. EEG to evaluate for seizure  Level of alertness:  lethargic  AEDs during EEG study: None  Technical aspects: This EEG study was done with scalp electrodes positioned according to the 10-20 International system of electrode placement. Electrical activity was reviewed with band pass filter of 1-'70Hz'$ , sensitivity of 7 uV/mm, display speed of 97m/sec with a '60Hz'$  notched filter applied as appropriate. EEG data were recorded continuously and digitally stored.  Video monitoring was available and reviewed as appropriate.  Description: EEG showed continuous generalized 3 to 6 Hz theta-delta slowing. Generalized periodic discharges with triphasic morphology at  1.5-'2Hz'$  were also noted, more prominent when awake/stimulated. Hyperventilation and photic stimulation were not performed.    ABNORMALITY - Periodic discharges with triphasic morphology, generalized ( GPDs) - Continuous slow, generalized  IMPRESSION: This study showed generalized periodic discharges with triphasic morphology which can be on the ictal-interictal continuum. However, the morphology, frequency and reactivity to stimulation is more commonly indicative of toxic-metabolic causes. Additionally there is moderate to severe diffuse encephalopathy, nonspecific etiology. No seizures were seen throughout the recording. This EEG is unchanged compared to yesterday. CSu Monks MD [facility]  [phone] 7pm- 7am, please page neurology on call as listed in [facility]   DG CHEST PORT 1 VIEW  Result Date: 08/06/2022 CLINICAL DATA:  Acute respiratory failure with hypoxia EXAM: PORTABLE CHEST 1 VIEW COMPARISON:  Chest x-ray 08/02/2022.  Chest CT 08/03/2022. FINDINGS: Bilateral pulmonary nodular densities are again noted and similar to the prior study. There is increased central pulmonary vascular congestion. The cardiomediastinal silhouette appears stable. There is no pneumothorax or acute fracture. IMPRESSION: 1. Increased central pulmonary vascular congestion. 2. Stable bilateral pulmonary nodules. Electronically Signed   By: ARonney AstersM.D.   On: 08/06/2022 17:33   EEG adult  Result Date: 08/06/2022 YLora Havens MD     08/06/2022  8:43 AM Patient Name: SRADIE BERGESMRN: [id number] Attending: PLora HavensReferring Physician/Provider: KDonnetta Simpers MD Date: 08/06/2022 Duration: 23.12 mins Patient history: 66year old female with alteration in mental status. EEG to evaluate for seizure Level of alertness:  lethargic AEDs during EEG study: None Technical aspects: This EEG study was done with scalp electrodes positioned according to the 10-20 International  system of electrode placement. Electrical activity was reviewed with band pass filter of 1-'70Hz'$ , sensitivity of 7 uV/mm, display speed of 53m/sec with a '60Hz'$  notched filter applied as appropriate. EEG data were recorded continuously and digitally stored.  Video monitoring was available and reviewed as appropriate. Description: EEG showed continuous generalized 3 to 6 Hz theta-delta slowing. Generalized periodic discharges with triphasic morphology at  1.5-'2Hz'$  were also noted, more prominent when awake/stimulated. Hyperventilation and photic stimulation were not performed.   ABNORMALITY - Periodic discharges with triphasic morphology, generalized ( GPDs) - Continuous slow, generalized IMPRESSION: This study showed  generalized periodic discharges with triphasic morphology which can be on the ictal-interictal continuum. However, the morphology, frequency and reactivity to stimulation is more commonly indicative of toxic-metabolic causes. Additionally there is moderate to severe diffuse encephalopathy, nonspecific etiology. No seizures were seen throughout the recording. PLora Havens  MR BRAIN WO CONTRAST  Result Date: 08/05/2022 CLINICAL DATA:  History of stroke, mental status change EXAM: MRI HEAD WITHOUT CONTRAST TECHNIQUE: Multiplanar, multiecho pulse sequences of the brain and surrounding structures were obtained without intravenous contrast. COMPARISON:  CT head 1 day prior, brain MRI 08/02/2022 FINDINGS: Brain: Again seen are extensive acute infarcts throughout both cerebral and cerebellar hemispheres across bilateral ACA, MCA, and PCA distributions, overall increased in extent compared to the MRI from 3 days prior. There is associated FLAIR signal abnormality and a few punctate foci of SWI signal dropout suspicious for petechial hemorrhage but no organized hematoma or mass effect. The foci of intrinsic T1 hyperintensity in the occipital lobes may be due to petechial hemorrhage. There is no acute intracranial hemorrhage or extra-axial fluid collection Background parenchymal volume is normal. The ventricles are stable in size. There is no mass lesion. There is no mass effect or midline shift. Vascular: Normal flow voids. Skull and upper cervical spine: Normal marrow signal. Sinuses/Orbits: The paranasal sinuses are clear. The globes and orbits are unremarkable. Other: None. IMPRESSION: Extensive acute infarcts throughout both cerebral and cerebellar hemispheres across multiple vascular distributions again favored embolic with differential including watershed ischemia, increased in extent since the MRI from 3 days prior. Suspect some petechial hemorrhage but no hematoma or mass effect. Electronically Signed   By:  PValetta MoleM.D.   On: 08/05/2022 19:45   ECHO TEE  Result Date: 08/05/2022    TRANSESOPHOGEAL ECHO REPORT   Patient Name:   SREGAN MCBRYARDate of Exam: 08/05/2022 Medical Rec #:  [id number]    Height:       63.0 in Accession #:    [id number]   Weight:       160.0 lb Date of Birth:  [birth year]    BSA:          1.759 m Patient Age:    62years      BP:           125/78 mmHg Patient Gender: F             HR:           99 bpm. Exam Location:  Inpatient Procedure: Transesophageal Echo, 3D Echo, Color Doppler, Cardiac Doppler and            Saline Contrast Bubble Study Indications:     Stroke i63.9  History:         Patient has prior history of Echocardiogram examinations, most  recent 08/03/2022. Risk Factors:Hypertension, Diabetes and                  Dyslipidemia.  Sonographer:     Raquel Sarna Senior RDCS Referring Phys:  [id number] Margie Billet Diagnosing Phys: Oswaldo Milian MD PROCEDURE: After discussion of the risks and benefits of a TEE, an informed consent was obtained from the patient. The transesophogeal probe was passed without difficulty through the esophogus of the patient. Sedation performed by different physician. The patient was monitored while under deep sedation. Anesthestetic sedation was provided intravenously by Anesthesiology: '70mg'$  of Propofol. The patient developed no complications during the procedure. IMPRESSIONS  1. Left ventricular ejection fraction, by estimation, is 55 to 60%. The left ventricle has normal function.  2. Right ventricular systolic function is normal. The right ventricular size is normal. There is moderately elevated pulmonary artery systolic pressure.  3. No left atrial/left atrial appendage thrombus was detected.  4. The mitral valve is normal in structure. Mild mitral valve regurgitation.  5. The aortic valve is tricuspid. Aortic valve regurgitation is not visualized.  6. Evidence of atrial level shunting detected by color flow Doppler. Agitated  saline contrast bubble study was positive with shunting observed within 3-6 cardiac cycles suggestive of interatrial shunt. Few bubbles seen in left atrium after patient coughed, suggesting small PFO. Small PFO confirmed visually with left to right shunting.  7. No vegetation seen FINDINGS  Left Ventricle: Left ventricular ejection fraction, by estimation, is 55 to 60%. The left ventricle has normal function. The left ventricular internal cavity size was normal in size. Right Ventricle: The right ventricular size is normal. No increase in right ventricular wall thickness. Right ventricular systolic function is normal. There is moderately elevated pulmonary artery systolic pressure. The tricuspid regurgitant velocity is 3.35 m/s, and with an assumed right atrial pressure of 3 mmHg, the estimated right ventricular systolic pressure is 24.2 mmHg. Left Atrium: Left atrial size was normal in size. No left atrial/left atrial appendage thrombus was detected. Right Atrium: Right atrial size was normal in size. Pericardium: Trivial pericardial effusion is present. Mitral Valve: The mitral valve is normal in structure. Mild mitral valve regurgitation. Tricuspid Valve: The tricuspid valve is normal in structure. Tricuspid valve regurgitation is mild. Aortic Valve: The aortic valve is tricuspid. Aortic valve regurgitation is not visualized. Aortic valve mean gradient measures 6.0 mmHg. Aortic valve peak gradient measures 10.2 mmHg. Pulmonic Valve: The pulmonic valve was grossly normal. Pulmonic valve regurgitation is not visualized. Aorta: The aortic root is normal in size and structure. IAS/Shunts: Evidence of atrial level shunting detected by color flow Doppler. Agitated saline contrast was given intravenously to evaluate for intracardiac shunting. Agitated saline contrast bubble study was positive with shunting observed within 3-6 cardiac cycles suggestive of interatrial shunt.  AORTIC VALVE AV Vmax:           160.00 cm/s AV  Vmean:          112.000 cm/s AV VTI:            0.244 m AV Peak Grad:      10.2 mmHg AV Mean Grad:      6.0 mmHg LVOT Vmax:         115.00 cm/s LVOT Vmean:        83.500 cm/s LVOT VTI:          0.210 m LVOT/AV VTI ratio: 0.86 TRICUSPID VALVE TR Peak grad:   44.9 mmHg TR Vmax:  335.00 cm/s  SHUNTS Systemic VTI: 0.21 m Oswaldo Milian MD Electronically signed by Oswaldo Milian MD Signature Date/Time: 08/05/2022/4:04:01 PM    Final    CT VENOGRAM HEAD  Result Date: 08/04/2022 CLINICAL DATA:  Dural venous sinus thrombosis suspected. EXAM: CT VENOGRAM HEAD TECHNIQUE: Venographic phase images of the brain were obtained following the administration of intravenous contrast. Multiplanar reformats and maximum intensity projections were generated. RADIATION DOSE REDUCTION: This exam was performed according to the departmental dose-optimization program which includes automated exposure control, adjustment of the mA and/or kV according to patient size and/or use of iterative reconstruction technique. CONTRAST:  45m OMNIPAQUE IOHEXOL 350 MG/ML SOLN COMPARISON:  Head and neck CTA 08/04/2022.  Head MRI 08/02/2022. FINDINGS: The recent MRI demonstrated numerous acute infarcts involving both cerebral hemispheres and cerebellum, the majority of which are occult by CT. No acute intracranial hemorrhage, midline shift, or extra-axial fluid collection is identified. The ventricles are normal in size. No fracture or suspicious osseous lesion is identified. There is minimal mucosal thickening in the left sphenoid sinus. The mastoid air cells are clear. The orbits are unremarkable. CT venogram images are motion degraded, however the superior sagittal sinus, internal cerebral veins, vein of Galen, straight sinus, and sigmoid sinuses are enhancing without evidence of thrombosis. The transverse sinuses are small but grossly patent. A small filling defect in the posterior aspect of the superior sagittal sinus is compatible  with an arachnoid granulation. IMPRESSION: No evidence of dural venous sinus thrombosis. Electronically Signed   By: ALogan BoresM.D.   On: 08/04/2022 16:45   CT ANGIO HEAD NECK W WO CM  Addendum Date: 08/04/2022   ADDENDUM REPORT: 08/04/2022 11:52 ADDENDUM: Findings were discussed with Dr. VEliseo Squireson 08/04/22 at 11:50 AM. Electronically Signed   By: HMarin RobertsM.D.   On: 08/04/2022 11:52   Result Date: 08/04/2022 CLINICAL DATA:  Stroke follow-up EXAM: CT ANGIOGRAPHY HEAD AND NECK TECHNIQUE: Multidetector CT imaging of the head and neck was performed using the standard protocol during bolus administration of intravenous contrast. Multiplanar CT image reconstructions and MIPs were obtained to evaluate the vascular anatomy. Carotid stenosis measurements (when applicable) are obtained utilizing NASCET criteria, using the distal internal carotid diameter as the denominator. RADIATION DOSE REDUCTION: This exam was performed according to the departmental dose-optimization program which includes automated exposure control, adjustment of the mA and/or kV according to patient size and/or use of iterative reconstruction technique. CONTRAST:  647mOMNIPAQUE IOHEXOL 350 MG/ML SOLN COMPARISON:  MRI Head 08/02/22 FINDINGS: CT HEAD FINDINGS Brain: No evidence of hemorrhage, hydrocephalus, extra-axial collection or mass lesion/mass effect. 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 Vascular: See below Skull: Normal. Negative for fracture or focal lesion. Sinuses/Orbits: No acute finding. Other: None. Review of the MIP images confirms the above findings CTA NECK FINDINGS Aortic arch: Standard branching. Imaged portion shows no evidence of aneurysm or dissection. No significant stenosis of the major arch vessel origins. Right carotid system: No evidence of  dissection, stenosis (50% or greater), or occlusion. Left carotid system: No evidence of dissection, stenosis (50% or greater), or occlusion. Vertebral arteries: Codominant. No evidence of dissection, stenosis (50% or greater), or occlusion. Skeleton: Negative. Other neck: Asymmetrically enlarged right level 5 lymph node measuring up to 6 mm (series 9, image 122). Additional enlarged right level 5 lymph node is present more  superiorly measuring 9 mm (series 9, image 106). Enlarged 7 mm left level 5 lymph node is also present (series 9, image 115). 9 mm right level 2A lymph node (series 9, image 94). There is soft tissue stranding along the posterior right suboccipital neck (series 9, image 87), nonspecific. Upper chest: Small bilateral pleural effusions. There is a 1.2 cm right upper lobe pulmonary nodule, better assessed on prior CT chest dated 08/04/2019. There also multiple enlarged mediastinal lymph nodes and left axillary lymph node. Review of the MIP images confirms the above findings CTA HEAD FINDINGS Anterior circulation: No significant stenosis, proximal occlusion, aneurysm, or vascular malformation. Posterior circulation: No significant stenosis, proximal occlusion, aneurysm, or vascular malformation. Venous sinuses: There is a congenitally small left transverse and sigmoid sinus. There is decreased opacification of the right-sided transverse and sigmoid sinus (series 11, image 122). This area of decreased opacification extends proximally to involve the superior sagittal sinus hazy month Anatomic variants: None Review of the MIP images confirms the above findings IMPRESSION: 1. Asymmetrically decreased opacification of the right-sided transverse and sigmoid sinus is worrisome for dural venous sinus thrombosis. Recommend further evaluation with a CT venogram. 2. No intracranial LVO or significant arterial stenosis in the neck. 3. No acute intracranial process. Extensive infarcts seen on prior MRI are  difficult to definitively visualize on this exam, but some of them are seen in the right centrum semiovale, bilateral occipital lobes, and in the right cerebellum. 4. Multiple enlarged lymph nodes in the neck, mediastinum, and left axilla, which are compatible with clinically suspected lymphoma. 5. Partially imaged pleural effusions and right sided pulmonary nodule, better seen on prior CTA chest. Electronically Signed: By: Marin Roberts M.D. On: 08/04/2022 11:17   VAS [place] LOWER EXTREMITY VENOUS (DVT)  Result Date: 08/03/2022  Lower Venous DVT Study Patient Name:  IBETH FAHMY  Date of Exam:   08/03/2022 Medical Rec #: [id number]      Accession #:    [id number] Date of Birth: 07-30-1956      Patient Gender: F Patient Age:   60 years Exam Location:  [facility] Procedure:      VAS [place] LOWER EXTREMITY VENOUS (DVT) Referring Phys: TIMOTHY OPYD --------------------------------------------------------------------------------  Indications: Elevated d-dimer, lymphoma.  Comparison       06-15-2021 Prior left lower extremity venous was negative for Study:           DVT. Performing Technologist: Darlin Coco RDMS, RVT  Examination Guidelines: A complete evaluation includes B-mode imaging, spectral Doppler, color Doppler, and power Doppler as needed of all accessible portions of each vessel. Bilateral testing is considered an integral part of a complete examination. Limited examinations for reoccurring indications may be performed as noted. The reflux portion of the exam is performed with the patient in reverse Trendelenburg.  +---------+---------------+---------+-----------+----------+--------------+ RIGHT    CompressibilityPhasicitySpontaneityPropertiesThrombus Aging +---------+---------------+---------+-----------+----------+--------------+ CFV      Full           Yes      Yes                                 +---------+---------------+---------+-----------+----------+--------------+ SFJ      Full                                                         +---------+---------------+---------+-----------+----------+--------------+  FV Prox  Full                                                        +---------+---------------+---------+-----------+----------+--------------+ FV Mid   Full                                                        +---------+---------------+---------+-----------+----------+--------------+ FV DistalFull                                                        +---------+---------------+---------+-----------+----------+--------------+ PFV      Full                                                        +---------+---------------+---------+-----------+----------+--------------+ POP      Full           Yes      Yes                                 +---------+---------------+---------+-----------+----------+--------------+ PTV      Full                                                        +---------+---------------+---------+-----------+----------+--------------+ PERO     Full                                                        +---------+---------------+---------+-----------+----------+--------------+ Gastroc  Full                                                        +---------+---------------+---------+-----------+----------+--------------+   +---------+---------------+---------+-----------+----------+--------------+ LEFT     CompressibilityPhasicitySpontaneityPropertiesThrombus Aging +---------+---------------+---------+-----------+----------+--------------+ CFV      Full           Yes      Yes                                 +---------+---------------+---------+-----------+----------+--------------+ SFJ      Full                                                        +---------+---------------+---------+-----------+----------+--------------+  FV Prox  Full                                                         +---------+---------------+---------+-----------+----------+--------------+ FV Mid   Full                                                        +---------+---------------+---------+-----------+----------+--------------+ FV DistalFull                                                        +---------+---------------+---------+-----------+----------+--------------+ PFV      Full                                                        +---------+---------------+---------+-----------+----------+--------------+ POP      Full           Yes      Yes                                 +---------+---------------+---------+-----------+----------+--------------+ PTV      Full                                                        +---------+---------------+---------+-----------+----------+--------------+ PERO     Partial        No       No                   Acute          +---------+---------------+---------+-----------+----------+--------------+ Gastroc  Full                                                        +---------+---------------+---------+-----------+----------+--------------+     Summary: RIGHT: - There is no evidence of deep vein thrombosis in the lower extremity.  - No cystic structure found in the popliteal fossa.  - Ultrasound characteristics of enlarged lymph nodes are noted in the groin.  LEFT: - Findings consistent with acute deep vein thrombosis involving the left peroneal veins.  - No cystic structure found in the popliteal fossa.  - Ultrasound characteristics of enlarged lymph nodes noted in the groin.  *See table(s) above for measurements and observations. Electronically signed by Harold Barban MD on 08/03/2022 at 9:30:05 PM.    Final    ECHOCARDIOGRAM COMPLETE  Result Date: 08/03/2022    ECHOCARDIOGRAM REPORT   Patient Name:   Beverly Gust Rho Date of Exam:  08/03/2022 Medical Rec #:  [id number]     Height:       63.0 in Accession #:    [id number]     Weight:       160.0 lb Date of Birth:  05-Sep-1956     BSA:          1.759 m Patient Age:    76 years      BP:           152/94 mmHg Patient Gender: F             HR:           110 bpm. Exam Location:  Inpatient Procedure: 2D Echo Indications:    stroke  History:        Patient has prior history of Echocardiogram examinations and                 Patient has no prior history of Echocardiogram examinations.                 Sepsis; Risk Factors:Hypertension.  Sonographer:    Johny Chess RDCS Referring Phys: [id number] RIPUDEEP K RAI  Sonographer Comments: Image acquisition challenging due to uncooperative patient. IMPRESSIONS  1. Left ventricular ejection fraction, by estimation, is 60 to 65%. The left ventricle has normal function. The left ventricle has no regional wall motion abnormalities. There is mild concentric left ventricular hypertrophy. Left ventricular diastolic parameters are consistent with Grade I diastolic dysfunction (impaired relaxation).  2. Right ventricular systolic function is normal. The right ventricular size is normal. Tricuspid regurgitation signal is inadequate for assessing PA pressure.  3. The mitral valve is normal in structure. No evidence of mitral valve regurgitation. No evidence of mitral stenosis.  4. The aortic valve is tricuspid. There is mild calcification of the aortic valve. Aortic valve regurgitation is not visualized. No aortic stenosis is present.  5. The inferior vena cava is normal in size with greater than 50% respiratory variability, suggesting right atrial pressure of 3 mmHg. FINDINGS  Left Ventricle: Left ventricular ejection fraction, by estimation, is 60 to 65%. The left ventricle has normal function. The left ventricle has no regional wall motion abnormalities. The left ventricular internal cavity size was normal in size. There is  mild concentric left ventricular hypertrophy. Left ventricular diastolic parameters are consistent with Grade I diastolic dysfunction  (impaired relaxation). Right Ventricle: The right ventricular size is normal. No increase in right ventricular wall thickness. Right ventricular systolic function is normal. Tricuspid regurgitation signal is inadequate for assessing PA pressure. Left Atrium: Left atrial size was normal in size. Right Atrium: Right atrial size was normal in size. Pericardium: There is no evidence of pericardial effusion. Mitral Valve: The mitral valve is normal in structure. There is mild calcification of the mitral valve leaflet(s). Mild mitral annular calcification. No evidence of mitral valve regurgitation. No evidence of mitral valve stenosis. Tricuspid Valve: The tricuspid valve is normal in structure. Tricuspid valve regurgitation is not demonstrated. Aortic Valve: The aortic valve is tricuspid. There is mild calcification of the aortic valve. Aortic valve regurgitation is not visualized. No aortic stenosis is present. Pulmonic Valve: The pulmonic valve was normal in structure. Pulmonic valve regurgitation is not visualized. Aorta: The aortic root is normal in size and structure. Venous: The inferior vena cava is normal in size with greater than 50% respiratory variability, suggesting right atrial pressure of 3 mmHg. IAS/Shunts: No atrial level shunt detected by color flow Doppler.  LEFT VENTRICLE  PLAX 2D LVIDd:         4.20 cm LVIDs:         2.40 cm LV PW:         0.90 cm LV IVS:        1.20 cm LVOT diam:     1.60 cm LV SV:         48 LV SV Index:   27 LVOT Area:     2.01 cm  RIGHT VENTRICLE             IVC RV S prime:     20.30 cm/s  IVC diam: 1.30 cm TAPSE (M-mode): 1.6 cm LEFT ATRIUM             Index        RIGHT ATRIUM          Index LA diam:        3.40 cm 1.93 cm/m   RA Area:     9.64 cm LA Vol (A2C):   26.3 ml 14.95 ml/m  RA Volume:   19.10 ml 10.86 ml/m LA Vol (A4C):   27.5 ml 15.64 ml/m LA Biplane Vol: 27.9 ml 15.86 ml/m  AORTIC VALVE LVOT Vmax:   152.00 cm/s LVOT Vmean:  105.000 cm/s LVOT VTI:    0.238 m   AORTA Ao Root diam: 3.00 cm MV E velocity: 79.60 cm/s MV A velocity: 141.00 cm/s  SHUNTS MV E/A ratio:  0.56         Systemic VTI:  0.24 m                             Systemic Diam: 1.60 cm Dalton McleanMD Electronically signed by Franki Monte Signature Date/Time: 08/03/2022/2:41:22 PM    Final    CT Angio Chest Pulmonary Embolism (PE) W or WO Contrast  Result Date: 08/03/2022 CLINICAL DATA:  Pulmonary embolism suspected, positive D-dimer. Undergoing malignancy workup with recent biopsy suggesting lymphoma. EXAM: CT ANGIOGRAPHY CHEST WITH CONTRAST TECHNIQUE: Multidetector CT imaging of the chest was performed using the standard protocol during bolus administration of intravenous contrast. Multiplanar CT image reconstructions and MIPs were obtained to evaluate the vascular anatomy. RADIATION DOSE REDUCTION: This exam was performed according to the departmental dose-optimization program which includes automated exposure control, adjustment of the mA and/or kV according to patient size and/or use of iterative reconstruction technique. CONTRAST:  58m OMNIPAQUE IOHEXOL 350 MG/ML SOLN COMPARISON:  None Available. FINDINGS: Cardiovascular: The heart is borderline enlarged. There is no pericardial effusion. Scattered coronary artery calcifications are noted. There is atherosclerotic calcification of the aorta without evidence of aneurysm. Pulmonary trunk is normal in caliber. No pulmonary artery filling defect is seen. Examination is limited due to respiratory motion artifact and pulmonary nodules. Mediastinum/Nodes: Multiple enlarged lymph nodes are present in the mediastinum and hilar regions bilaterally. No axillary lymphadenopathy. Thyroid gland, trachea, and esophagus are within normal limits. Lungs/Pleura: Hazy regional ground-glass opacities are present in the lungs bilaterally. There are multiple scattered pulmonary nodules bilaterally, the largest in the right lower lobe measuring 3.7 cm, axial image 110  and in the left lower lobe measuring 4.1 cm, axial image 94. No effusion or pneumothorax. Upper Abdomen: Enlarged lymph nodes are present in the gastrohepatic ligament measuring up to 1.1 cm. No acute abnormality. Musculoskeletal: No acute or suspicious osseous abnormality. Review of the MIP images confirms the above findings. IMPRESSION: 1. No evidence of pulmonary embolism. 2. Multiple enlarged  mediastinal and hilar lymph nodes and pulmonary nodules bilaterally measuring up to 4.1 cm, concerning for neoplastic process or lymphoma. Correlation with recent biopsy results is recommended. 3. Mild hazy geographic ground-glass opacities in the lungs, possible edema or air trapping. 4. Coronary artery calcifications. 5. Aortic atherosclerosis. Electronically Signed   By: Brett Fairy M.D.   On: 08/03/2022 01:10   MR BRAIN WO CONTRAST  Result Date: 08/02/2022 CLINICAL DATA:  Altered mental status EXAM: MRI HEAD WITHOUT CONTRAST TECHNIQUE: Multiplanar, multiecho pulse sequences of the brain and surrounding structures were obtained without intravenous contrast. COMPARISON:  04/15/2013 FINDINGS: Brain: Numerous, diffuse bilateral foci of abnormal diffusion restriction in a pattern that is most suggestive of embolic shower but could also indicate watershed ischemia. No acute hemorrhage. There is multifocal hyperintense T2-weighted signal within the white matter. Generalized volume loss. The midline structures are normal. Vascular: Major flow voids are preserved. Skull and upper cervical spine: Normal calvarium and skull base. Visualized upper cervical spine and soft tissues are normal. Sinuses/Orbits:No paranasal sinus fluid levels or advanced mucosal thickening. No mastoid or middle ear effusion. Normal orbits. IMPRESSION: 1. Numerous, diffuse bilateral foci of abnormal diffusion restriction in a pattern that is most suggestive of embolic shower but could also indicate watershed ischemia. 2. No acute hemorrhage or mass  effect. Electronically Signed   By: Ulyses Jarred M.D.   On: 08/02/2022 23:57   CT Head Wo Contrast  Result Date: 08/02/2022 CLINICAL DATA:  Altered mental status, recent diagnosis of lymphoma EXAM: CT HEAD WITHOUT CONTRAST TECHNIQUE: Contiguous axial images were obtained from the base of the skull through the vertex without intravenous contrast. RADIATION DOSE REDUCTION: This exam was performed according to the departmental dose-optimization program which includes automated exposure control, adjustment of the mA and/or kV according to patient size and/or use of iterative reconstruction technique. COMPARISON:  MR brain done on 04/15/2013 FINDINGS: Brain: No acute intracranial findings are seen in noncontrast CT brain. There are no signs of bleeding within the cranium. Ventricles are not dilated. Cortical sulci are prominent. There is no focal edema or mass effect. Vascular: Unremarkable Skull: Unremarkable. Sinuses/Orbits: Unremarkable. Other: None. IMPRESSION: No acute intracranial findings are seen in noncontrast CT brain. Atrophy. Electronically Signed   By: Elmer Picker M.D.   On: 08/02/2022 16:35   DG Chest 2 View  Result Date: 08/02/2022 CLINICAL DATA:  Cough, altered mental status x3 days EXAM: CHEST - 2 VIEW COMPARISON:  04/21/2021 FINDINGS: Cardiac size is within normal limits. Thoracic aorta is tortuous. There are nodular densities of varying sizes scattered throughout both lungs largest overlying the inferior right hilum measuring 2.8 cm. Findings suggest possible metastatic disease. Less likely possibility would be nodular appearing multifocal pneumonia. There is no pleural effusion or pneumothorax. Degenerative changes are noted in both shoulders. IMPRESSION: There are multiple nodular densities of varying sizes in both lungs suggesting pulmonary metastatic disease. Follow-up CT, PET-CT and biopsy as warranted should be considered. Please correlate with clinical history for any known  primary malignancy. Electronically Signed   By: Elmer Picker M.D.   On: 08/02/2022 15:37     PHYSICAL EXAM  Temp:  [98 F (36.7 C)-99.8 F (37.7 C)] 99.5 F (37.5 C) (10/18 0809) Pulse Rate:  [88-105] 97 (10/18 0449) Resp:  [22-25] 24 (10/18 0809) BP: (118-130)/(62-77) 130/73 (10/18 0809) SpO2:  [92 %-97 %] 92 % (10/18 0809) Weight:  [78.5 kg] 78.5 kg (10/18 0449)  General - Well nourished, well developed, tachypnea.  Ophthalmologic - fundi  not visualized due to noncooperation.  Cardiovascular - Regular rhythm with mild tachycardia.  Neuro - eyes open spontaneously. Eyes midline, doll's eyes present, not tracking bilaterally, intermittently blinking to visual threat bilaterally. She was able to say "what" when I called her name, and said "yah" when I asked her if she was OK. Did not answer other questions. Seems able to follow commands on foot movement. No facial droop. Tongue protrusion not cooperative. Slight withdraw to pain in UEs and mild withdraw to pain in LEs, L>R. Sensation, coordination and gait not tested.   ASSESSMENT/PLAN Ms. Sarah Walsh is a 66 y.o. female with history of recent diagnosed lymphoma, recent MRSA infection admitted for altered mental status, lethargy and not talking. No tPA given due to outside window.    Stroke:  bilateral embolic shower, etiology not quite clear, however could be due to distal DVT in the setting of small PFO or viscosity of blood from lymphoma with significant leukocytosis CT no acute abnormality MRI 19/75 embolic shower bilaterally CTA chest no PE CT head and neck and CTV unremarkable MRI 10/13 extension of embolic shower bilaterally 2D Echo EF 60 to 65% LE venous Doppler left peroneal vein DVT TEE showed small PFO LDL 91 HgbA1c 6.0 Ammonia level normal Heparin IV for VTE prophylaxis No antithrombotic prior to admission, now on heparin IV.  On amantadine 200 bid for arousal  Ongoing aggressive stroke risk factor  management Therapy recommendations: CIR Disposition: Pending, palliative care on board, family meeting 10/19  Hypertension Stable Long term BP goal normotensive  Hyperlipidemia Home meds: Zetia LDL 91, goal < 70 Intolerance to statin Continue zetia at discharge  Fever and cough Tmax 101.4->afebrile->99.8 Tachycardia and tachypnea present  Intermittent coughing Blood culture negative, urine culture showed pansensitive E. Coli Off Rocephin, Flagyl and vancomycin now CXR Mild left basilar subsegmental atelectasis is noted with small left pleural effusion  Dysphagia Did not pass swallow n.p.o. now Speech on board cortrak placed for nutrition  PFO LE DVT Small PFO on TEE LE venous Doppler left peroneal vein DVT On heparin IV  Lymphoma, untreated Recent diagnosis [facility] Dr. Lorenso Courier on board WBC 48.8->48.2->46.3->55.1->63.8->64.6->65.0->68.3 Viscosity of the blood can be part of the stroke etiology  Other Stroke Risk Factors Advanced age  Other Active Problems Recent MRSA infection, on vancomycin  Hospital day # 7    Rosalin Hawking, MD PhD Stroke Neurology 08/10/2022 1:10 PM    To contact Stroke Continuity provider, please refer to [email or web address] After hours, contact General Neurology

## 2022-08-10 NOTE — Inpatient Diabetes Management (Signed)
Inpatient Diabetes Program Recommendations  AACE/ADA: New Consensus Statement on Inpatient Glycemic Control (2015)  Target Ranges:  Prepandial:   less than 140 mg/dL      Peak postprandial:   less than 180 mg/dL (1-2 hours)      Critically ill patients:  140 - 180 mg/dL   Lab Results  Component Value Date   GLUCAP 195 (H) 08/10/2022   HGBA1C 6.0 (H) 08/03/2022    Review of Glycemic Control  Latest Reference Range & Units 08/09/22 16:52 08/09/22 20:50 08/10/22 00:13 08/10/22 04:53 08/10/22 08:02  Glucose-Capillary 70 - 99 mg/dL 195 (H) 163 (H) 193 (H) 176 (H) 195 (H)   Diabetes history: DM 2 Outpatient Diabetes medications: metformin 500 mg bid Current orders for Inpatient glycemic control:  none  Jevity 55 ml/hour  Inpatient Diabetes Program Recommendations:    Palliative care consult, worsening LOC  -   if in the plan of care may consider Novolog 0-6 units Q4 starting at 151 mg/d while on tube feed.  Thanks,  Tama Headings RN, MSN, BC-ADM Inpatient Diabetes Coordinator Team Pager [phone] (8a-5p)

## 2022-08-10 NOTE — Progress Notes (Signed)
Palliative:  Consult received and chart reviewed. Called and spoke with daughter [name]. Role of palliative care reviewed. Meeting scheduled with patient's daughter and son at 71 am 10/19.  Juel Burrow, DNP, AGNP-C Palliative Medicine Team Team Phone # [phone]  Pager # [phone]  NO CHARGE

## 2022-08-10 NOTE — Progress Notes (Signed)
**Note De-Identified vi Obfusction** PROGRESS NOTE    Sarah Walsh  WIO:[id number] DOB: Apr 16, 1956 DOA: 08/02/2022 PCP: Suenne Mrgrit, DO  Chief Complint  Ptient presents with   Altered Mentl Sttus    Brief Nrrtive:  Sarah Walsh is  66 y.o. femle with  history of T2DM, HLD, HTN, recent cervicl LN biopsy with +lymphom reportedly T cell, not yet strted on tretment, presented to ED 10/10 with lethrgy, confusion, norexi, git instbility. MRI brin ws positive for scttered corticl nd subcorticl infrcts. She ws dmitted, lso noted to hve fevers, WBC 42.6S with eosinophilic predominnce.    Repet MRI due to worsening level of consciousness showed extension of embolic shower strokes. EEG nonspecific without seizure recorded. Brod IV ntibiotics hve been continues empiriclly though no nidus of infection hs been identified. Gols of cre discussions re ongoing. Pllitive cre is consulted to ssist.   Assessment & Pln:   Principl Problem:   Sepsis ([place]) Active Problems:   Essentil hypertension   Lymphom ([place])   DVT (deep venous thrombosis) ([facility])   Acute metbolic encephlopthy   Acute CVA (cerebrovsculr ccident) ([place])   Mlnutrition of moderte degree   Assessment nd Pln: Acute Hypoxic Respirtory Filure Incresed wob tody with diminished breth sounds, expirtory wheezing Prior CT showed no PE (10/11 imging), but did show multiple enlrged medistinl nd hilr LN's nd pulm nodules mesuring up to 4.1 CM s well s mild hzy geogrphic ggo's in the lungs, possible edem or ir trpping CXR 10/18 with bilterl pulm nodules concerning for metsttic disese No true fevers, leukocytosis relted to lymphom Given wheezing dd steroids/nebs.  Elevted BNP s well, diurese s tolerted.  Hold dditionl IVF (she's net positive 10 L).  Given WOB, requested respirtory evl for bipp PCCM consulted, pprecite ssistnce   Sepsis: Ptient met sepsis criteri on dmission with  tchycrdi, fevers, leukocytosis, lctic cidosis, source uncler.   - febrile t this point, leukocytosis relted to lymphom, low threshold to restrt bx in setting of bove, but lower suspicion for infectious cuse t this point - bcx NGTD from 10/10. DDx fever includes due to lymphom, centrl, less likely DVT in nd of itself. - With negtive cultures, completed ceftrixone 2g q24h, vncomycin, flgyl x7 dys (10/16). - Fungitell negtive - Scheduled tylenol - Fever curve improved   Acute metbolic encephlopthy: Most likely relted to widespred embolic CVAs.  - EEG with nonspecific chnges without seizure ctivity noted initilly. EEG repeted nd unchnged 10/15. Anticipte some wxing/wning.  - Amntdine strted per neurology for rousl - Limited neurologicl recovery t this time. Apprecite neurology recommendtions.   Extensive bilterl corticl nd subcorticl ischemic CVAs: MRI brin showed numerous diffuse bilterl foci of bnorml diffusion restriction in  pttern most suggestive of embolic shower but could lso indicte wtershed ischemi.  - repet MRI 10/13 showed extensive cute infrcts throughout both cerebrl nd cerebellr hemispheres cross multiple vsculr distributions gin fvored embolic (with ddx including wtershed ischemi) - incresed in extent from MRI done previously  - PFO found on TEE. In conjunction with know DVT (lbeit distl) nd embolic distribution, strted heprin, gol level 0.3-0.5.  - Neuro checks to continue - Cortrk inserted 10/16 - holding tube feeds t this point with respirtory distress requiring bipp - We re pursing pllitive cre discussions with the fmily dily. They re very involved nd hve  relistic understnding of expected poor prognosis, though it is ultimte sttus is yet to be determined. Will consult pllitive for ongoing discussions.     Drk urine: Due to **Note De-Identified vi Obfusction** bilirubinuri due to lymphom. No WBCs on repet UA.     Metbolic cidosis: No nion gp. Resolved.   Hypoklemi:  - supplement with diuressi   PSVT: Burden decresed on repet tele review tody.  - Convert IV to enterl metoprolol. BP tolerting.   HTN:  - BP gol per neurology, prn hydrlzine ordered   Acute RLE peronel DVT - CTA negtive for PE.  - Heprin s discussed bove.  - Interestingly, per Dr. Hdley Pen discussion with heme/onc, Dr. Lorenso Courier, lymphoms re not clssiclly ssocited with hypercogulbility. The severity of leukocytosis is lso not ner the expected threshold to cuse leukostsis.    T-cell lymphom: Recent dignosis, hs not been strted on tretment yet.   - Oncology, Dr. Lorenso Courier consulted. Of course the ptient is not currently  cndidte for tretment nd, given her norml cell counts otherwise, doubt there'd be ny benefit to pulsed steroids t this time. There would certinly be risks s well. The fmily is wre of this discussion. - There is no further work up or tretment currently plnned s n inptient for this issue.    Eosinophilic leukocytosis:  - suspect this is relted to her T cell lymphom. Note subtypes of T cell lymphoms cn berrntly express IL-5.  - Work up hs not suggested nephritis, heptitis, She hs no history of this prior to dignosis, no ongoing drug/llergic rection, no prsitic infection detected. Could be expected t the time of her drug eruption fter Tx for MRSA recently though tht gent ws stopped nd rsh hs resolved durbly. HIV NR - Will pln to monitor CBC w/differentil. Note stble eosinophili, with norml PMNs tody.     DVT prophylxis: heprin Code Sttus: full Fmily Communiction: ex husbnd, dughter.  Hd extended converstion with dughter regrding ptient's criticlly ill sttus.  We discussed code sttus nd I encourged her to discuss with fmily.  Disposition:   Sttus is: Inptient Remins inptient pproprite becuse: continued need for  inptient cre   Consultnts:  PCCM Oncology Neurology  Procedures:  TEE IMPRESSIONS     1. Left ventriculr ejection frction, by estimtion, is 55 to 60%. The  left ventricle hs norml function.   2. Right ventriculr systolic function is norml. The right ventriculr  size is norml. There is modertely elevted pulmonry rtery systolic  pressure.   3. No left tril/left tril ppendge thrombus ws detected.   4. The mitrl vlve is norml in structure. Mild mitrl vlve  regurgittion.   5. The ortic vlve is tricuspid. Aortic vlve regurgittion is not  visulized.   6. Evidence of tril level shunting detected by color flow Doppler.  Agitted sline contrst bubble study ws positive with shunting observed  within 3-6 crdic cycles suggestive of intertril shunt. Few bubbles  seen in left trium fter ptient  coughed, suggesting smll PFO. Smll PFO confirmed visully with left to  right shunting.   7. No vegettion seen   Echo IMPRESSIONS     1. Left ventriculr ejection frction, by estimtion, is 60 to 65%. The  left ventricle hs norml function. The left ventricle hs no regionl  wll motion bnormlities. There is mild concentric left ventriculr  hypertrophy. Left ventriculr distolic  prmeters re consistent with Grde I distolic dysfunction (impired  relxtion).   2. Right ventriculr systolic function is norml. The right ventriculr  size is norml. Tricuspid regurgittion signl is indequte for ssessing  PA pressure.   3. The mitrl vlve is norml in structure. No evidence of mitrl vlve  regurgitation. No evidence of mitral stenosis.   4. The aortic valve is tricuspid. There is mild calcification of the  aortic valve. Aortic valve regurgitation is not visualized. No aortic  stenosis is present.   5. The inferior vena cava is normal in size with greater than 50%  respiratory variability, suggesting right atrial pressure of 3  mmHg.  LE [place] Summary:  RIGHT:  - There is no evidence of deep vein thrombosis in the lower extremity.     - No cystic structure found in the popliteal fossa.     - Ultrasound characteristics of enlarged lymph nodes are noted in the  groin.     LEFT:  - Findings consistent with acute deep vein thrombosis involving the left  peroneal veins.     - No cystic structure found in the popliteal fossa.     - Ultrasound characteristics of enlarged lymph nodes noted in the groin.      Antimicrobials:  Anti-infectives (From admission, onward)    Start     Dose/Rate Route Frequency Ordered Stop   08/08/22 2100  cefTRIAXone (ROCEPHIN) 2 g in sodium chloride 0.9 % 100 mL IVPB        2 g 200 mL/hr over 30 Minutes Intravenous Every 24 hours 08/08/22 1818 08/09/22 0130   08/06/22 0200  vancomycin (VANCOCIN) IVPB 1000 mg/200 mL premix  Status:  Discontinued        1,000 mg 200 mL/hr over 60 Minutes Intravenous Every 12 hours 08/05/22 1934 08/08/22 1818   08/05/22 2100  cefTRIAXone (ROCEPHIN) 2 g in sodium chloride 0.9 % 100 mL IVPB  Status:  Discontinued        2 g 200 mL/hr over 30 Minutes Intravenous Every 24 hours 08/05/22 2029 08/08/22 1818   08/03/22 1000  metroNIDAZOLE (FLAGYL) IVPB 500 mg  Status:  Discontinued        500 mg 100 mL/hr over 60 Minutes Intravenous Every 12 hours 08/02/22 2206 08/08/22 1818   08/03/22 0600  ceFEPIme (MAXIPIME) 2 g in sodium chloride 0.9 % 100 mL IVPB  Status:  Discontinued        2 g 200 mL/hr over 30 Minutes Intravenous Every 8 hours 08/02/22 1918 08/05/22 2028   08/03/22 0330  vancomycin (VANCOREADY) IVPB 750 mg/150 mL  Status:  Discontinued        750 mg 150 mL/hr over 60 Minutes Intravenous Every 12 hours 08/02/22 1918 08/05/22 1934   08/02/22 1930  vancomycin (VANCOREADY) IVPB 1500 mg/300 mL        1,500 mg 150 mL/hr over 120 Minutes Intravenous  Once 08/02/22 1915 08/02/22 2207   08/02/22 1915  ceFEPIme (MAXIPIME) 2 g in sodium chloride 0.9 % 100  mL IVPB  Status:  Discontinued        2 g 200 mL/hr over 30 Minutes Intravenous  Once 08/02/22 1900 08/02/22 1918   08/02/22 1915  metroNIDAZOLE (FLAGYL) IVPB 500 mg        500 mg 100 mL/hr over 60 Minutes Intravenous  Once 08/02/22 1900 08/02/22 2135   08/02/22 1915  vancomycin (VANCOCIN) IVPB 1000 mg/200 mL premix  Status:  Discontinued        1,000 mg 200 mL/hr over 60 Minutes Intravenous  Once 08/02/22 1900 08/02/22 1915       Subjective: Nonverbal   Objective: Vitals:   08/10/22 0449 08/10/22 0809 08/10/22 1446 08/10/22 1535  BP: 119/62 130/73 114/78 124/71  Pulse: 97  91   Resp: (!) 24 (!)  24 (!) 21 18  Temp: 99 F (37.2 C) 99.5 F (37.5 C)    TempSrc: xillary xillary    SpO2: 97% 92% 94% 94%  Weight: 78.5 kg     Height:        Intake/Output Summary (Last 24 hours) at 08/10/2022 1614 Last data filed at 08/10/2022 0348 Gross per 24 hour  Intake 1322.19 ml  Output --  Net 1322.19 ml   Filed Weights   08/02/22 1500 08/09/22 0429 08/10/22 0449  Weight: 72.6 kg 82.8 kg 78.5 kg    Examination:  General exam: red in the face with resp effort  Respiratory system: increased WOB, diminished breath sounds with expiratory wheezing throughout Cardiovascular system: RRR Gastrointestinal system: bdomen is nondistended, soft and nontender. Central nervous system: not meaningfully following commands for me (family says she's answered simple questions), PERRL, moving LE's to noxious stimuli (not clearly uppers) Extremities: no LEE  Data Reviewed: I have personally reviewed following labs and imaging studies  CBC: Recent Labs  Lab 08/07/22 0218 08/07/22 0618 08/08/22 0152 08/09/22 0458 08/10/22 0525  WBC 54.5* 63.8* 64.6* 65.0* 68.3*  NEUTROBS  --  13.4* 7.7 7.8* 9.6*  HGB 10.9* 12.9 12.1 12.6 11.9*  HCT 30.8* 36.2 35.0* 38.6 35.6*  MCV 93.3 93.3 95.1 98.5 97.5  PLT 181 208 208 221 149    Basic Metabolic Panel: Recent Labs  Lab 08/06/22 0133  08/07/22 0618 08/08/22 0152 08/08/22 1701 08/09/22 0458 08/09/22 1606 08/10/22 0525  N 136 136 139  --  138  --  137  K 3.2* 3.2* 3.3*  --  3.6  --  3.5  CL 107 104 103  --  103  --  104  CO2 18* 23 23  --  24  --  26  GLUCOSE 144* 153* 124*  --  173*  --  206*  BUN _0 --  18  --  16  CRETININE 0.72 0.68 0.64  --  0.63  --  0.62  CLCIUM 7.9* 7.8* 8.0*  --  8.1*  --  8.0*  MG  --   --   --  2.1 2.1 2.1 2.1  PHOS  --   --   --  1.9* 1.9* 2.0* 1.9*    GFR: Estimated Creatinine Clearance: 66.7 mL/min (by C-G formula based on SCr of 0.62 mg/dL).  Liver Function Tests: Recent Labs  Lab 08/09/22 0458  ST 18  LT 11  LKPHOS 76  BILITOT 0.1*  PROT 5.7*  LBUMIN 2.1*    CBG: Recent Labs  Lab 08/09/22 2050 08/10/22 0013 08/10/22 0453 08/10/22 0802 08/10/22 1205  GLUCP 163* 193* 176* 195* 194*     Recent Results (from the past 240 hour(s))  Resp Panel by RT-PCR (Flu &B, Covid) nterior Nasal Swab     Status: None   Collection Time: 08/02/22  3:10 PM   Specimen: nterior Nasal Swab  Result Value Ref Range Status   SRS Coronavirus 2 by RT PCR NEGTIVE NEGTIVE Final    Comment: (NOTE) SRS-CoV-2 target nucleic acids are NOT DETECTED.  The SRS-CoV-2 RN is generally detectable in upper respiratory specimens during the acute phase of infection. The lowest concentration of SRS-CoV-2 viral copies this assay can detect is 138 copies/mL.  negative result does not preclude SRS-Cov-2 infection and should not be used as the sole basis for treatment or other patient management decisions.  negative result may occur with  improper specimen collection/handling, submission of specimen other than **Note De-Identified vi Obfusction** nsophryngel swb, presence of virl muttion(s) within the res trgeted by this ssy, nd indequte number of virl copies(<138 copies/mL).  negtive result must be combined with clinicl observtions, ptient history, nd epidemiologicl informtion. The  expected result is Negtive.  Fct Sheet for Ptients:  EntrepreneurPulse.com.u  Fct Sheet for [place] Providers:  IncredibleEmployment.be  This test is no t yet pproved or clered by the [place] FD nd  hs been uthorized for detection nd/or dignosis of SRS-CoV-2 by FD under n Emergency Use uthoriztion (EU). This EU will remin  in effect (mening this test cn be used) for the durtion of the COVID-19 declrtion under Section 564(b)(1) of the [place], 21 U.S.C.section 360bbb-3(b)(1), unless the uthoriztion is terminted  or revoked sooner.       Influenz  by PCR NEGTIVE NEGTIVE Finl   Influenz B by PCR NEGTIVE NEGTIVE Finl    Comment: (NOTE) The Xpert Xpress SRS-CoV-2/FLU/RSV plus ssy is intended s n id in the dignosis of influenz from Nsophryngel swb specimens nd should not be used s  sole bsis for tretment. Nsl wshings nd spirtes re uncceptble for Xpert Xpress SRS-CoV-2/FLU/RSV testing.  Fct Sheet for Ptients: EntrepreneurPulse.com.u  Fct Sheet for [place] Providers: IncredibleEmployment.be  This test is not yet pproved or clered by the [place] FD nd hs been uthorized for detection nd/or dignosis of SRS-CoV-2 by FD under n Emergency Use uthoriztion (EU). This EU will remin in effect (mening this test cn be used) for the durtion of the COVID-19 declrtion under Section 564(b)(1) of the ct, 21 U.S.C. section 360bbb-3(b)(1), unless the uthoriztion is terminted or revoked.  Performed t [facility], [place] [address], [place], [place] [address]   Blood culture (routine x 2)     Sttus: None   Collection Time: 08/02/22  3:16 PM   Specimen: BLOOD  Result Vlue Ref Rnge Sttus   Specimen Description BLOOD LEFT NTECUBITL  Finl   Specil Requests   Finl    BOTTLES DRWN EROBIC ND NEROBIC Blood  Culture results my not be optiml due to n indequte volume of blood received in culture bottles   Culture   Finl    NO GROWTH 5 DYS Performed t [facility], [place] [address], [place], [place] [address]    Report Sttus 08/07/2022 FINL  Finl  Blood culture (routine x 2)     Sttus: None   Collection Time: 08/02/22  7:02 PM   Specimen: BLOOD  Result Vlue Ref Rnge Sttus   Specimen Description BLOOD LEFT NTECUBITL  Finl   Specil Requests   Finl    BOTTLES DRWN EROBIC ND NEROBIC Blood Culture dequte volume   Culture   Finl    NO GROWTH 5 DYS Performed t [facility], [place] [address], [place], [place] [address]    Report Sttus 08/07/2022 FINL  Finl  Urine Culture     Sttus: bnorml   Collection Time: 08/02/22  9:37 PM   Specimen: Urine, Clen Ctch  Result Vlue Ref Rnge Sttus   Specimen Description URINE, CLEN CTCH  Finl   Specil Requests   Finl    NONE Performed t [facility], [place] [address] [place] [address]    Culture 10,000 COLONIES/mL ESCHERICHI COLI ()  Finl   Report Sttus 08/05/2022 FINL  Finl   Orgnism ID, Bcteri ESCHERICHI COLI ()  Finl      Susceptibility   Escherichi coli - MIC*    MPICILLIN  8 SENSITIVE Sensitive     CEFAZOLIN <=4 SENSITIVE Sensitive     CEFEPIME <=0.12 SENSITIVE Sensitive     CEFTRIAXONE <=0.25 SENSITIVE Sensitive     CIPROFLOXACIN <=0.25 SENSITIVE Sensitive     GENTAMICIN <=1 SENSITIVE Sensitive     IMIPENEM <=0.25 SENSITIVE Sensitive     NITROFURANTOIN <=16 SENSITIVE Sensitive     TRIMETH/SULFA <=20 SENSITIVE Sensitive     AMPICILLIN/SULBACTAM 4 SENSITIVE Sensitive     PIP/TAZO <=4 SENSITIVE Sensitive     * 10,000 COLONIES/mL ESCHERICHIA COLI         Radiology Studies: DG CHEST PORT 1 VIEW  Result Date: 08/10/2022 CLINICAL DATA:  Cough. EXAM: PORTABLE CHEST 1 VIEW COMPARISON:  August 06, 2022. FINDINGS: Stable cardiomediastinal silhouette. Feeding tube is  seen entering stomach. Bilateral pulmonary nodules are noted concerning for metastatic disease. Mild left basilar subsegmental atelectasis is noted with small left pleural effusion. Bony thorax is unremarkable. IMPRESSION: Bilateral pulmonary nodules are again noted concerning for metastatic disease. Mild left basilar subsegmental atelectasis is noted with small left pleural effusion. Electronically Signed   By: Marijo Conception M.D.   On: 08/10/2022 12:38        Scheduled Meds:  acetaminophen  650 mg Per Tube Q6H   Or   acetaminophen  650 mg Rectal Q6H   amantadine  200 mg Per Tube BID   docusate  100 mg Per Tube QHS   ezetimibe  10 mg Per Tube Daily   feeding supplement (PROSource TF20)  60 mL Per Tube Daily   ipratropium-albuterol  3 mL Nebulization Q6H   methylPREDNISolone (SOLU-MEDROL) injection  40 mg Intravenous Q12H   metoprolol tartrate  25 mg Per Tube BID   sennosides  5 mL Per Tube QHS   sodium chloride flush  3 mL Intravenous Q12H   Continuous Infusions:  sodium chloride Stopped (08/08/22 2327)   feeding supplement (JEVITY 1.5 CAL/FIBER) 1,000 mL (08/10/22 0200)   heparin 1,400 Units/hr (08/10/22 1200)     LOS: 7 days    Time spent: over 30 min    Fayrene Helper, MD [facility]   To contact the attending provider between 7A-7P or the covering provider during after hours 7P-7A, please log into the web site [email or web address] and access using universal [place] password for that web site. If you do not have the password, please call the hospital operator.  08/10/2022, 4:14 PM

## 2022-08-10 NOTE — Progress Notes (Signed)
   08/10/22 2357  BiPAP/CPAP/SIPAP  BiPAP/CPAP/SIPAP Pt Type Adult  Mask Type Full face mask  Mask Size Medium  Set Rate 10 breaths/min  IPAP 14 cmH20  EPAP 6 cmH2O  Oxygen Percent 40 %  Minute Ventilation 9.6  Leak 8.4  Peak Inspiratory Pressure (PIP) 14  Tidal Volume (Vt) 494  BiPAP/CPAP/SIPAP BiPAP  Patient Home Equipment No  Auto Titrate No  Press High Alarm 25 cmH2O  Press Low Alarm 5 cmH2O  BiPAP/CPAP /SiPAP Vitals  Pulse Rate (!) 103  Resp (!) 24   Pt. A little more alert but unable to follow commands

## 2022-08-10 NOTE — Progress Notes (Incomplete)
   08/10/22 2357  BiPAP/CPAP/SIPAP  BiPAP/CPAP/SIPAP Pt Type Adult  Mask Type Full face mask  Mask Size Medium  Set Rate 10 breaths/min  IPAP 14 cmH20  EPAP 6 cmH2O  Oxygen Percent 40 %  Minute Ventilation 9.6  Leak 8.4  Peak Inspiratory Pressure (PIP) 14  Tidal Volume (Vt) 494  BiPAP/CPAP/SIPAP BiPAP  Patient Home Equipment No  Auto Titrate No  Press High Alarm 25 cmH2O  Press Low Alarm 5 cmH2O  BiPAP/CPAP /SiPAP Vitals  Pulse Rate (!) 103  Resp (!) 24   Pt. A little more alert but unable to follow commands

## 2022-08-10 NOTE — Significant Event (Addendum)
Rapid Response Event Note   Reason for Call :  Respiratory distress  Called by RT following initiation of BiPAP. Per RT, pt continued to have increased work of breathing despite BiPAP, '40mg'$  IV Lasix, '40mg'$  IV Solu-medrol, Duoneb, and Albuterol. (See MAR for times & documentation.)  Initial Focused Assessment:  Pt is noted to have had close to 1L UOP upon my assessment. Breathing is unlabored. Regular RR. Lung sounds are clear. Pt pulling 585 TV, mild air leak noted due to Cortrak. Skin is warm, dry, pink. Generalized edema.  Pt opens her eyes to my voice, but does not interact with me.   ABG: 7.47/ 39/ 65/ 28.3 VS: T 98.62F, BP 124/71, HR 96, RR 18, SpO2 94% on BiPAP 40% 12/6.   Interventions:  No intervention from RRT  Plan of Care:  -Pt having good response to IV Lasix.  -BiPAP per RT  Call rapid response for additional needs  Event Summary:  MD Notified: Dr. Florene Glen, at bedside Call Time: 1519 Arrival Time: [place] End Time: [place], RN

## 2022-08-10 NOTE — Progress Notes (Signed)
RT Spoke to elink about concerns about pt., being Unable to follow commands on bipap elink wishes to leave pt. On bipap at this time.

## 2022-08-10 NOTE — Progress Notes (Signed)
PT Cancellation Note  Patient Details Name: Sarah Walsh MRN: [id number] DOB: 1955-12-20   Cancelled Treatment:    Reason Eval/Treat Not Completed: Patient not medically ready Pt received in bed with husband present in room but exiting session shortly after PT arrival. Pt with labored breathing (SpO2 >90% on 3L/min via nasal cannula) with husband reporting her breathing efforts fluctuate. After speaking with nurse & MD, MD recommends holding PT tx at this time 2/2 respiratory standpoint. Will f/u as able.  [name], PT, DPT 08/10/22, 2:12 PM   [name] 08/10/2022, 2:11 PM

## 2022-08-10 NOTE — Progress Notes (Signed)
ANTICOAGULATION CONSULT NOTE - Follow Up Consult  Pharmacy Consult for heparin Indication:  multiple embolic strokes with paradoxical embolism and DVT  Labs: Recent Labs    08/08/22 0152 08/09/22 0458 08/10/22 0525  HGB 12.1 12.6 11.9*  HCT 35.0* 38.6 35.6*  PLT 208 221 241  HEPARINUNFRC 0.51 0.50 0.64  CREATININE 0.64 0.63 0.62     Assessment: 66yo female subtherapeutic on heparin with initial cautious dosing for paradoxical embolism and DVT in setting of multiple embolic strokes. Goals of care discussions are ongoing.   Heparin level 0.64 and above goal. Hg stable  Goal of Therapy:  Heparin level 0.3-0.5 units/ml per neurology   Plan:  Decrease heparin to 1400 units/hr Monitor CBC daily with heparin level Will follow plans for oral anticoagulation  Hildred Laser, PharmD Clinical Pharmacist **Pharmacist phone directory can now be found on [email or web address] ([email or web address]).  Listed under [place].

## 2022-08-10 NOTE — Progress Notes (Signed)
   08/10/22 2050  BiPAP/CPAP/SIPAP  BiPAP/CPAP/SIPAP Pt Type Adult  Mask Type Full face mask  Mask Size Medium  Set Rate 10 breaths/min  IPAP 14 cmH20  EPAP 6 cmH2O  Oxygen Percent 40 %  Minute Ventilation 8.2  Leak 7  Peak Inspiratory Pressure (PIP) 14  Tidal Volume (Vt) 395  BiPAP/CPAP/SIPAP BiPAP  Patient Home Equipment No  Auto Titrate No  Press High Alarm 25 cmH2O  Press Low Alarm 5 cmH2O  BiPAP/CPAP /SiPAP Vitals  Pulse Rate 94  Resp 19  SpO2 96 %  MEWS Score/Color  MEWS Score 0  MEWS Score Color Green   Pt. On bipap on above settings. Pt. Not answering or following commands RN aware

## 2022-08-11 DIAGNOSIS — C8448 Peripheral T-cell lymphoma, not classified, lymph nodes of multiple sites: Secondary | ICD-10-CM | POA: Diagnosis not present

## 2022-08-11 DIAGNOSIS — I82452 Acute embolism and thrombosis of left peroneal vein: Secondary | ICD-10-CM | POA: Diagnosis not present

## 2022-08-11 DIAGNOSIS — Z66 Do not resuscitate: Secondary | ICD-10-CM

## 2022-08-11 DIAGNOSIS — Z7189 Other specified counseling: Secondary | ICD-10-CM

## 2022-08-11 DIAGNOSIS — G9340 Encephalopathy, unspecified: Secondary | ICD-10-CM | POA: Diagnosis not present

## 2022-08-11 DIAGNOSIS — A4102 Sepsis due to Methicillin resistant Staphylococcus aureus: Secondary | ICD-10-CM | POA: Diagnosis not present

## 2022-08-11 DIAGNOSIS — J9601 Acute respiratory failure with hypoxia: Secondary | ICD-10-CM | POA: Diagnosis not present

## 2022-08-11 DIAGNOSIS — G9341 Metabolic encephalopathy: Secondary | ICD-10-CM | POA: Diagnosis not present

## 2022-08-11 DIAGNOSIS — I639 Cerebral infarction, unspecified: Secondary | ICD-10-CM | POA: Diagnosis not present

## 2022-08-11 DIAGNOSIS — Z515 Encounter for palliative care: Secondary | ICD-10-CM

## 2022-08-11 LAB — CBC WITH DIFFERENTIAL/PLATELET
Abs Immature Granulocytes: 0.4 10*3/uL — ABNORMAL HIGH (ref 0.00–0.07)
Basophils Absolute: 0 10*3/uL (ref 0.0–0.1)
Basophils Relative: 0 %
Eosinophils Absolute: 22.5 10*3/uL — ABNORMAL HIGH (ref 0.0–0.5)
Eosinophils Relative: 51 %
HCT: 34.1 % — ABNORMAL LOW (ref 36.0–46.0)
Hemoglobin: 11.5 g/dL — ABNORMAL LOW (ref 12.0–15.0)
Lymphocytes Relative: 4 %
Lymphs Abs: 1.8 10*3/uL (ref 0.7–4.0)
MCH: 32.3 pg (ref 26.0–34.0)
MCHC: 33.7 g/dL (ref 30.0–36.0)
MCV: 95.8 fL (ref 80.0–100.0)
Monocytes Absolute: 0.9 10*3/uL (ref 0.1–1.0)
Monocytes Relative: 2 %
Myelocytes: 1 %
Neutro Abs: 18.5 10*3/uL — ABNORMAL HIGH (ref 1.7–7.7)
Neutrophils Relative %: 42 %
Platelets: 226 10*3/uL (ref 150–400)
RBC: 3.56 MIL/uL — ABNORMAL LOW (ref 3.87–5.11)
RDW: 17.1 % — ABNORMAL HIGH (ref 11.5–15.5)
WBC: 44.1 10*3/uL — ABNORMAL HIGH (ref 4.0–10.5)
nRBC: 0 % (ref 0.0–0.2)

## 2022-08-11 LAB — GLUCOSE, CAPILLARY
Glucose-Capillary: 160 mg/dL — ABNORMAL HIGH (ref 70–99)
Glucose-Capillary: 250 mg/dL — ABNORMAL HIGH (ref 70–99)
Glucose-Capillary: 194 mg/dL — ABNORMAL HIGH (ref 70–99)
Glucose-Capillary: 147 mg/dL — ABNORMAL HIGH (ref 70–99)
Glucose-Capillary: 138 mg/dL — ABNORMAL HIGH (ref 70–99)
Glucose-Capillary: 125 mg/dL — ABNORMAL HIGH (ref 70–99)
Glucose-Capillary: 223 mg/dL — ABNORMAL HIGH (ref 70–99)

## 2022-08-11 LAB — HEPARIN LEVEL (UNFRACTIONATED): Heparin Unfractionated: 0.68 IU/mL (ref 0.30–0.70)

## 2022-08-11 LAB — COMPREHENSIVE METABOLIC PANEL
ALT: 11 U/L (ref 0–44)
AST: 16 U/L (ref 15–41)
Albumin: 2 g/dL — ABNORMAL LOW (ref 3.5–5.0)
Alkaline Phosphatase: 66 U/L (ref 38–126)
Anion gap: 9 (ref 5–15)
BUN: 16 mg/dL (ref 8–23)
CO2: 27 mmol/L (ref 22–32)
Calcium: 8 mg/dL — ABNORMAL LOW (ref 8.9–10.3)
Chloride: 99 mmol/L (ref 98–111)
Creatinine, Ser: 0.62 mg/dL (ref 0.44–1.00)
GFR, Estimated: >60 mL/min (ref >60)
Glucose, Bld: 192 mg/dL — ABNORMAL HIGH (ref 70–99)
Potassium: 4.4 mmol/L (ref 3.5–5.1)
Sodium: 135 mmol/L (ref 135–145)
Total Bilirubin: 0.4 mg/dL (ref 0.3–1.2)
Total Protein: 5.5 g/dL — ABNORMAL LOW (ref 6.5–8.1)

## 2022-08-11 LAB — PHOSPHORUS: Phosphorus: 2.1 mg/dL — ABNORMAL LOW (ref 2.5–4.6)

## 2022-08-11 LAB — MAGNESIUM: Magnesium: 2 mg/dL (ref 1.7–2.4)

## 2022-08-11 MED ORDER — METHYLPREDNISOLONE SODIUM SUCC 40 MG IJ SOLR
40.0000 mg | Freq: Every day | INTRAMUSCULAR | Status: AC
  Administered 2022-08-12: 40 mg via INTRAVENOUS
  Filled 2022-08-11: qty 1

## 2022-08-11 MED ORDER — FUROSEMIDE 10 MG/ML IJ SOLN
40.0000 mg | Freq: Every day | INTRAMUSCULAR | Status: DC
  Administered 2022-08-12 – 2022-08-14 (×3): 40 mg via INTRAVENOUS
  Filled 2022-08-11 (×3): qty 4

## 2022-08-11 NOTE — Progress Notes (Addendum)
STROKE TEAM PROGRESS NOTE   SUBJECTIVE (INTERVAL HISTORY) Son and ex-husband are at the bedside.  Patient eyes open, off BiPAP now, no [place], breathing well, no distress. Seems more responding than yesterday, able to tell me her name "Sarah Walsh", able to say "what" "No" for questions. More movement of BLEs, but still plegic bilateral UEs.     OBJECTIVE Temp:  [97.8 F (36.6 C)-99.5 F (37.5 C)] 98.3 F (36.8 C) (10/19 1247) Pulse Rate:  [91-103] 92 (10/19 1247) Cardiac Rhythm: Normal sinus rhythm (10/19 0956) Resp:  [18-24] 21 (10/19 1247) BP: (122-134)/(70-87) 127/75 (10/19 1247) SpO2:  [92 %-98 %] 92 % (10/19 1247) FiO2 (%):  [40 %] 40 % (10/18 2050) Weight:  [82.2 kg] 82.2 kg (10/19 0531)  Recent Labs  Lab 08/10/22 2016 08/11/22 0150 08/11/22 0632 08/11/22 0755 08/11/22 1125  GLUCAP 250* 194* 147* 138* 125*   Recent Labs  Lab 08/07/22 0618 08/08/22 0152 08/08/22 1701 08/09/22 0458 08/09/22 1606 08/10/22 0525 08/11/22 0149  NA 136 139  --  138  --  137 135  K 3.2* 3.3*  --  3.6  --  3.5 4.4  CL 104 103  --  103  --  104 99  CO2 23 23  --  24  --  26 27  GLUCOSE 153* 124*  --  173*  --  206* 192*  BUN 14 14  --  18  --  16 16  CREATININE 0.68 0.64  --  0.63  --  0.62 0.62  CALCIUM 7.8* 8.0*  --  8.1*  --  8.0* 8.0*  MG  --   --  2.1 2.1 2.1 2.1 2.0  PHOS  --   --  1.9* 1.9* 2.0* 1.9* 2.1*   Recent Labs  Lab 08/09/22 0458 08/11/22 0149  AST 18 16  ALT 11 11  ALKPHOS 76 66  BILITOT 0.1* 0.4  PROT 5.7* 5.5*  ALBUMIN 2.1* 2.0*   Recent Labs  Lab 08/07/22 0618 08/08/22 0152 08/09/22 0458 08/10/22 0525 08/11/22 0149  WBC 63.8* 64.6* 65.0* 68.3* 44.1*  NEUTROABS 13.4* 7.7 7.8* 9.6* 18.5*  HGB 12.9 12.1 12.6 11.9* 11.5*  HCT 36.2 35.0* 38.6 35.6* 34.1*  MCV 93.3 95.1 98.5 97.5 95.8  PLT 208 208 221 241 226   No results for input(s): "CKTOTAL", "CKMB", "CKMBINDEX", "TROPONINI" in the last 168 hours. No results for input(s): "LABPROT", "INR" in the last 72  hours. No results for input(s): "COLORURINE", "LABSPEC", "PHURINE", "GLUCOSEU", "HGBUR", "BILIRUBINUR", "KETONESUR", "PROTEINUR", "UROBILINOGEN", "NITRITE", "LEUKOCYTESUR" in the last 72 hours.  Invalid input(s): "APPERANCEUR"      Component Value Date/Time   CHOL 127 08/03/2022 0905   TRIG 97 08/03/2022 0905   HDL 17 (L) 08/03/2022 0905   CHOLHDL 7.5 08/03/2022 0905   VLDL 19 08/03/2022 0905   LDLCALC 91 08/03/2022 0905   Lab Results  Component Value Date   HGBA1C 6.0 (H) 08/03/2022   No results found for: "LABOPIA", "COCAINSCRNUR", "LABBENZ", "AMPHETMU", "THCU", "LABBARB"  No results for input(s): "ETH" in the last 168 hours.  I have personally reviewed the radiological images below and agree with the radiology interpretations.  DG CHEST PORT 1 VIEW  Result Date: 08/10/2022 CLINICAL DATA:  Cough. EXAM: PORTABLE CHEST 1 VIEW COMPARISON:  August 06, 2022. FINDINGS: Stable cardiomediastinal silhouette. Feeding tube is seen entering stomach. Bilateral pulmonary nodules are noted concerning for metastatic disease. Mild left basilar subsegmental atelectasis is noted with small left pleural effusion. Bony thorax is unremarkable. IMPRESSION:  Bilateral pulmonary nodules are again noted concerning for metastatic disease. Mild left basilar subsegmental atelectasis is noted with small left pleural effusion. Electronically Signed   By: Marijo Conception M.D.   On: 08/10/2022 12:38   DG Abd Portable 1V  Result Date: 08/08/2022 CLINICAL DATA:  Feeding tube placement EXAM: PORTABLE ABDOMEN - 1 VIEW COMPARISON:  CT abdomen 05/29/2006 FINDINGS: Partially visualized bilateral pulmonary nodules. Bilateral hilar lymphadenopathy. Feeding tube with the tip projecting over the antrum of the stomach. No bowel dilatation to suggest obstruction. No evidence of pneumoperitoneum, portal venous gas or pneumatosis. No pathologic calcifications along the expected course of the ureters. No acute osseous abnormality.  IMPRESSION: 1. Feeding tube with the tip projecting over the antrum of the stomach. 2. Redemonstrated bilateral hilar lymphadenopathy and bilateral pulmonary nodules. Electronically Signed   By: Kathreen Devoid M.D.   On: 08/08/2022 13:00   EEG adult  Result Date: 08/07/2022 Derek Jack, MD     08/07/2022 12:34 PM Patient Name: RIGBY SWAMY MRN: [id number] Epilepsy Attending: Su Monks MD Referring Physician/Provider: Donnetta Simpers, MD Date: 08/07/2022 Duration: 24.09 min  Patient history: 66 year old female with alteration in mental status. EEG to evaluate for seizure  Level of alertness:  lethargic  AEDs during EEG study: None  Technical aspects: This EEG study was done with scalp electrodes positioned according to the 10-20 International system of electrode placement. Electrical activity was reviewed with band pass filter of 1-'70Hz'$ , sensitivity of 7 uV/mm, display speed of 19m/sec with a '60Hz'$  notched filter applied as appropriate. EEG data were recorded continuously and digitally stored.  Video monitoring was available and reviewed as appropriate.  Description: EEG showed continuous generalized 3 to 6 Hz theta-delta slowing. Generalized periodic discharges with triphasic morphology at  1.5-'2Hz'$  were also noted, more prominent when awake/stimulated. Hyperventilation and photic stimulation were not performed.    ABNORMALITY - Periodic discharges with triphasic morphology, generalized ( GPDs) - Continuous slow, generalized  IMPRESSION: This study showed generalized periodic discharges with triphasic morphology which can be on the ictal-interictal continuum. However, the morphology, frequency and reactivity to stimulation is more commonly indicative of toxic-metabolic causes. Additionally there is moderate to severe diffuse encephalopathy, nonspecific etiology. No seizures were seen throughout the recording. This EEG is unchanged compared to yesterday. CSu Monks MD [facility]  [phone] 7pm- 7am, please page neurology on call as listed in [place]   DG CHEST PORT 1 VIEW  Result Date: 08/06/2022 CLINICAL DATA:  Acute respiratory failure with hypoxia EXAM: PORTABLE CHEST 1 VIEW COMPARISON:  Chest x-ray 08/02/2022.  Chest CT 08/03/2022. FINDINGS: Bilateral pulmonary nodular densities are again noted and similar to the prior study. There is increased central pulmonary vascular congestion. The cardiomediastinal silhouette appears stable. There is no pneumothorax or acute fracture. IMPRESSION: 1. Increased central pulmonary vascular congestion. 2. Stable bilateral pulmonary nodules. Electronically Signed   By: ARonney AstersM.D.   On: 08/06/2022 17:33   EEG adult  Result Date: 08/06/2022 YLora Havens MD     08/06/2022  8:43 AM Patient Name: SARRAYAH CONNORSMRN: [id number] Attending: PLora HavensReferring Physician/Provider: KDonnetta Simpers MD Date: 08/06/2022 Duration: 23.12 mins Patient history: 66year old female with alteration in mental status. EEG to evaluate for seizure Level of alertness:  lethargic AEDs during EEG study: None Technical aspects: This EEG study was done with scalp electrodes positioned according to the 10-20 International system of electrode placement. Electrical activity was reviewed with band pass filter of 1-'70Hz'$ ,  sensitivity of 7 uV/mm, display speed of 38m/sec with a '60Hz'$  notched filter applied as appropriate. EEG data were recorded continuously and digitally stored.  Video monitoring was available and reviewed as appropriate. Description: EEG showed continuous generalized 3 to 6 Hz theta-delta slowing. Generalized periodic discharges with triphasic morphology at  1.5-'2Hz'$  were also noted, more prominent when awake/stimulated. Hyperventilation and photic stimulation were not performed.   ABNORMALITY - Periodic discharges with triphasic morphology, generalized ( GPDs) - Continuous slow, generalized IMPRESSION: This study showed  generalized periodic discharges with triphasic morphology which can be on the ictal-interictal continuum. However, the morphology, frequency and reactivity to stimulation is more commonly indicative of toxic-metabolic causes. Additionally there is moderate to severe diffuse encephalopathy, nonspecific etiology. No seizures were seen throughout the recording. PLora Havens  MR BRAIN WO CONTRAST  Result Date: 08/05/2022 CLINICAL DATA:  History of stroke, mental status change EXAM: MRI HEAD WITHOUT CONTRAST TECHNIQUE: Multiplanar, multiecho pulse sequences of the brain and surrounding structures were obtained without intravenous contrast. COMPARISON:  CT head 1 day prior, brain MRI 08/02/2022 FINDINGS: Brain: Again seen are extensive acute infarcts throughout both cerebral and cerebellar hemispheres across bilateral ACA, MCA, and PCA distributions, overall increased in extent compared to the MRI from 3 days prior. There is associated FLAIR signal abnormality and a few punctate foci of SWI signal dropout suspicious for petechial hemorrhage but no organized hematoma or mass effect. The foci of intrinsic T1 hyperintensity in the occipital lobes may be due to petechial hemorrhage. There is no acute intracranial hemorrhage or extra-axial fluid collection Background parenchymal volume is normal. The ventricles are stable in size. There is no mass lesion. There is no mass effect or midline shift. Vascular: Normal flow voids. Skull and upper cervical spine: Normal marrow signal. Sinuses/Orbits: The paranasal sinuses are clear. The globes and orbits are unremarkable. Other: None. IMPRESSION: Extensive acute infarcts throughout both cerebral and cerebellar hemispheres across multiple vascular distributions again favored embolic with differential including watershed ischemia, increased in extent since the MRI from 3 days prior. Suspect some petechial hemorrhage but no hematoma or mass effect. Electronically Signed   By:  PValetta MoleM.D.   On: 08/05/2022 19:45   ECHO TEE  Result Date: 08/05/2022    TRANSESOPHOGEAL ECHO REPORT   Patient Name:   STENNA LACKODate of Exam: 08/05/2022 Medical Rec #:  [id number]    Height:       63.0 in Accession #:    [id number]   Weight:       160.0 lb Date of Birth:  4Jun 15, 1957    BSA:          1.759 m Patient Age:    89years      BP:           125/78 mmHg Patient Gender: F             HR:           99 bpm. Exam Location:  Inpatient Procedure: Transesophageal Echo, 3D Echo, Color Doppler, Cardiac Doppler and            Saline Contrast Bubble Study Indications:     Stroke i63.9  History:         Patient has prior history of Echocardiogram examinations, most                  recent 08/03/2022. Risk Factors:Hypertension, Diabetes and  Dyslipidemia.  Sonographer:     Raquel Sarna Senior RDCS Referring Phys:  [id number] Margie Billet Diagnosing Phys: Oswaldo Milian MD PROCEDURE: After discussion of the risks and benefits of a TEE, an informed consent was obtained from the patient. The transesophogeal probe was passed without difficulty through the esophogus of the patient. Sedation performed by different physician. The patient was monitored while under deep sedation. Anesthestetic sedation was provided intravenously by Anesthesiology: '70mg'$  of Propofol. The patient developed no complications during the procedure. IMPRESSIONS  1. Left ventricular ejection fraction, by estimation, is 55 to 60%. The left ventricle has normal function.  2. Right ventricular systolic function is normal. The right ventricular size is normal. There is moderately elevated pulmonary artery systolic pressure.  3. No left atrial/left atrial appendage thrombus was detected.  4. The mitral valve is normal in structure. Mild mitral valve regurgitation.  5. The aortic valve is tricuspid. Aortic valve regurgitation is not visualized.  6. Evidence of atrial level shunting detected by color flow Doppler. Agitated  saline contrast bubble study was positive with shunting observed within 3-6 cardiac cycles suggestive of interatrial shunt. Few bubbles seen in left atrium after patient coughed, suggesting small PFO. Small PFO confirmed visually with left to right shunting.  7. No vegetation seen FINDINGS  Left Ventricle: Left ventricular ejection fraction, by estimation, is 55 to 60%. The left ventricle has normal function. The left ventricular internal cavity size was normal in size. Right Ventricle: The right ventricular size is normal. No increase in right ventricular wall thickness. Right ventricular systolic function is normal. There is moderately elevated pulmonary artery systolic pressure. The tricuspid regurgitant velocity is 3.35 m/s, and with an assumed right atrial pressure of 3 mmHg, the estimated right ventricular systolic pressure is 28.3 mmHg. Left Atrium: Left atrial size was normal in size. No left atrial/left atrial appendage thrombus was detected. Right Atrium: Right atrial size was normal in size. Pericardium: Trivial pericardial effusion is present. Mitral Valve: The mitral valve is normal in structure. Mild mitral valve regurgitation. Tricuspid Valve: The tricuspid valve is normal in structure. Tricuspid valve regurgitation is mild. Aortic Valve: The aortic valve is tricuspid. Aortic valve regurgitation is not visualized. Aortic valve mean gradient measures 6.0 mmHg. Aortic valve peak gradient measures 10.2 mmHg. Pulmonic Valve: The pulmonic valve was grossly normal. Pulmonic valve regurgitation is not visualized. Aorta: The aortic root is normal in size and structure. IAS/Shunts: Evidence of atrial level shunting detected by color flow Doppler. Agitated saline contrast was given intravenously to evaluate for intracardiac shunting. Agitated saline contrast bubble study was positive with shunting observed within 3-6 cardiac cycles suggestive of interatrial shunt.  AORTIC VALVE AV Vmax:           160.00 cm/s AV  Vmean:          112.000 cm/s AV VTI:            0.244 m AV Peak Grad:      10.2 mmHg AV Mean Grad:      6.0 mmHg LVOT Vmax:         115.00 cm/s LVOT Vmean:        83.500 cm/s LVOT VTI:          0.210 m LVOT/AV VTI ratio: 0.86 TRICUSPID VALVE TR Peak grad:   44.9 mmHg TR Vmax:        335.00 cm/s  SHUNTS Systemic VTI: 0.21 m Oswaldo Milian MD Electronically signed by Oswaldo Milian MD Signature Date/Time: 08/05/2022/4:04:01 PM  Final    CT VENOGRAM HEAD  Result Date: 08/04/2022 CLINICAL DATA:  Dural venous sinus thrombosis suspected. EXAM: CT VENOGRAM HEAD TECHNIQUE: Venographic phase images of the brain were obtained following the administration of intravenous contrast. Multiplanar reformats and maximum intensity projections were generated. RADIATION DOSE REDUCTION: This exam was performed according to the departmental dose-optimization program which includes automated exposure control, adjustment of the mA and/or kV according to patient size and/or use of iterative reconstruction technique. CONTRAST:  56m OMNIPAQUE IOHEXOL 350 MG/ML SOLN COMPARISON:  Head and neck CTA 08/04/2022.  Head MRI 08/02/2022. FINDINGS: The recent MRI demonstrated numerous acute infarcts involving both cerebral hemispheres and cerebellum, the majority of which are occult by CT. No acute intracranial hemorrhage, midline shift, or extra-axial fluid collection is identified. The ventricles are normal in size. No fracture or suspicious osseous lesion is identified. There is minimal mucosal thickening in the left sphenoid sinus. The mastoid air cells are clear. The orbits are unremarkable. CT venogram images are motion degraded, however the superior sagittal sinus, internal cerebral veins, vein of Galen, straight sinus, and sigmoid sinuses are enhancing without evidence of thrombosis. The transverse sinuses are small but grossly patent. A small filling defect in the posterior aspect of the superior sagittal sinus is compatible  with an arachnoid granulation. IMPRESSION: No evidence of dural venous sinus thrombosis. Electronically Signed   By: ALogan BoresM.D.   On: 08/04/2022 16:45   CT ANGIO HEAD NECK W WO CM  Addendum Date: 08/04/2022   ADDENDUM REPORT: 08/04/2022 11:52 ADDENDUM: Findings were discussed with Dr. VEliseo Squireson 08/04/22 at 11:50 AM. Electronically Signed   By: HMarin RobertsM.D.   On: 08/04/2022 11:52   Result Date: 08/04/2022 CLINICAL DATA:  Stroke follow-up EXAM: CT ANGIOGRAPHY HEAD AND NECK TECHNIQUE: Multidetector CT imaging of the head and neck was performed using the standard protocol during bolus administration of intravenous contrast. Multiplanar CT image reconstructions and MIPs were obtained to evaluate the vascular anatomy. Carotid stenosis measurements (when applicable) are obtained utilizing NASCET criteria, using the distal internal carotid diameter as the denominator. RADIATION DOSE REDUCTION: This exam was performed according to the departmental dose-optimization program which includes automated exposure control, adjustment of the mA and/or kV according to patient size and/or use of iterative reconstruction technique. CONTRAST:  613mOMNIPAQUE IOHEXOL 350 MG/ML SOLN COMPARISON:  MRI Head 08/02/22 FINDINGS: CT HEAD FINDINGS Brain: No evidence of hemorrhage, hydrocephalus, extra-axial collection or mass lesion/mass effect. 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 Vascular: See below Skull: Normal. Negative for fracture or focal lesion. Sinuses/Orbits: No acute finding. Other: None. Review of the MIP images confirms the above findings CTA NECK FINDINGS Aortic arch: Standard branching. Imaged portion shows no evidence of aneurysm or dissection. No significant stenosis of the major arch vessel origins. Right carotid system: No evidence of  dissection, stenosis (50% or greater), or occlusion. Left carotid system: No evidence of dissection, stenosis (50% or greater), or occlusion. Vertebral arteries: Codominant. No evidence of dissection, stenosis (50% or greater), or occlusion. Skeleton: Negative. Other neck: Asymmetrically enlarged right level 5 lymph node measuring up to 6 mm (series 9, image 122). Additional enlarged right level 5 lymph node is present more superiorly measuring 9 mm (series 9, image 106). Enlarged 7 mm left level 5 lymph node is also present (series 9, image 115). 9  mm right level 2A lymph node (series 9, image 94). There is soft tissue stranding along the posterior right suboccipital neck (series 9, image 87), nonspecific. Upper chest: Small bilateral pleural effusions. There is a 1.2 cm right upper lobe pulmonary nodule, better assessed on prior CT chest dated 08/04/2019. There also multiple enlarged mediastinal lymph nodes and left axillary lymph node. Review of the MIP images confirms the above findings CTA HEAD FINDINGS Anterior circulation: No significant stenosis, proximal occlusion, aneurysm, or vascular malformation. Posterior circulation: No significant stenosis, proximal occlusion, aneurysm, or vascular malformation. Venous sinuses: There is a congenitally small left transverse and sigmoid sinus. There is decreased opacification of the right-sided transverse and sigmoid sinus (series 11, image 122). This area of decreased opacification extends proximally to involve the superior sagittal sinus hazy month Anatomic variants: None Review of the MIP images confirms the above findings IMPRESSION: 1. Asymmetrically decreased opacification of the right-sided transverse and sigmoid sinus is worrisome for dural venous sinus thrombosis. Recommend further evaluation with a CT venogram. 2. No intracranial LVO or significant arterial stenosis in the neck. 3. No acute intracranial process. Extensive infarcts seen on prior MRI are  difficult to definitively visualize on this exam, but some of them are seen in the right centrum semiovale, bilateral occipital lobes, and in the right cerebellum. 4. Multiple enlarged lymph nodes in the neck, mediastinum, and left axilla, which are compatible with clinically suspected lymphoma. 5. Partially imaged pleural effusions and right sided pulmonary nodule, better seen on prior CTA chest. Electronically Signed: By: Marin Roberts M.D. On: 08/04/2022 11:17   VAS [place] LOWER EXTREMITY VENOUS (DVT)  Result Date: 08/03/2022  Lower Venous DVT Study Patient Name:  SULEIMA OHLENDORF  Date of Exam:   08/03/2022 Medical Rec #: [id number]      Accession #:    [id number] Date of Birth: Feb 17, 1956      Patient Gender: F Patient Age:   25 years Exam Location:  [facility] Procedure:      VAS [place] LOWER EXTREMITY VENOUS (DVT) Referring Phys: TIMOTHY OPYD --------------------------------------------------------------------------------  Indications: Elevated d-dimer, lymphoma.  Comparison       06-15-2021 Prior left lower extremity venous was negative for Study:           DVT. Performing Technologist: Darlin Coco RDMS, RVT  Examination Guidelines: A complete evaluation includes B-mode imaging, spectral Doppler, color Doppler, and power Doppler as needed of all accessible portions of each vessel. Bilateral testing is considered an integral part of a complete examination. Limited examinations for reoccurring indications may be performed as noted. The reflux portion of the exam is performed with the patient in reverse Trendelenburg.  +---------+---------------+---------+-----------+----------+--------------+ RIGHT    CompressibilityPhasicitySpontaneityPropertiesThrombus Aging +---------+---------------+---------+-----------+----------+--------------+ CFV      Full           Yes      Yes                                 +---------+---------------+---------+-----------+----------+--------------+ SFJ      Full                                                         +---------+---------------+---------+-----------+----------+--------------+ FV Prox  Full                                                        +---------+---------------+---------+-----------+----------+--------------+  FV Mid   Full                                                        +---------+---------------+---------+-----------+----------+--------------+ FV DistalFull                                                        +---------+---------------+---------+-----------+----------+--------------+ PFV      Full                                                        +---------+---------------+---------+-----------+----------+--------------+ POP      Full           Yes      Yes                                 +---------+---------------+---------+-----------+----------+--------------+ PTV      Full                                                        +---------+---------------+---------+-----------+----------+--------------+ PERO     Full                                                        +---------+---------------+---------+-----------+----------+--------------+ Gastroc  Full                                                        +---------+---------------+---------+-----------+----------+--------------+   +---------+---------------+---------+-----------+----------+--------------+ LEFT     CompressibilityPhasicitySpontaneityPropertiesThrombus Aging +---------+---------------+---------+-----------+----------+--------------+ CFV      Full           Yes      Yes                                 +---------+---------------+---------+-----------+----------+--------------+ SFJ      Full                                                        +---------+---------------+---------+-----------+----------+--------------+ FV Prox  Full                                                         +---------+---------------+---------+-----------+----------+--------------+  FV Mid   Full                                                        +---------+---------------+---------+-----------+----------+--------------+ FV DistalFull                                                        +---------+---------------+---------+-----------+----------+--------------+ PFV      Full                                                        +---------+---------------+---------+-----------+----------+--------------+ POP      Full           Yes      Yes                                 +---------+---------------+---------+-----------+----------+--------------+ PTV      Full                                                        +---------+---------------+---------+-----------+----------+--------------+ PERO     Partial        No       No                   Acute          +---------+---------------+---------+-----------+----------+--------------+ Gastroc  Full                                                        +---------+---------------+---------+-----------+----------+--------------+     Summary: RIGHT: - There is no evidence of deep vein thrombosis in the lower extremity.  - No cystic structure found in the popliteal fossa.  - Ultrasound characteristics of enlarged lymph nodes are noted in the groin.  LEFT: - Findings consistent with acute deep vein thrombosis involving the left peroneal veins.  - No cystic structure found in the popliteal fossa.  - Ultrasound characteristics of enlarged lymph nodes noted in the groin.  *See table(s) above for measurements and observations. Electronically signed by Harold Barban MD on 08/03/2022 at 9:30:05 PM.    Final    ECHOCARDIOGRAM COMPLETE  Result Date: 08/03/2022    ECHOCARDIOGRAM REPORT   Patient Name:   JOHNETTA SLONIKER Date of Exam: 08/03/2022 Medical Rec #:  [id number]     Height:       63.0 in Accession #:    [id number]     Weight:       160.0 lb Date of Birth:  [birth year]     BSA:          1.759 m Patient Age:  66 years      BP:           152/94 mmHg Patient Gender: F             HR:           110 bpm. Exam Location:  Inpatient Procedure: 2D Echo Indications:    stroke  History:        Patient has prior history of Echocardiogram examinations and                 Patient has no prior history of Echocardiogram examinations.                 Sepsis; Risk Factors:Hypertension.  Sonographer:    Johny Chess RDCS Referring Phys: [id number] RIPUDEEP K RAI  Sonographer Comments: Image acquisition challenging due to uncooperative patient. IMPRESSIONS  1. Left ventricular ejection fraction, by estimation, is 60 to 65%. The left ventricle has normal function. The left ventricle has no regional wall motion abnormalities. There is mild concentric left ventricular hypertrophy. Left ventricular diastolic parameters are consistent with Grade I diastolic dysfunction (impaired relaxation).  2. Right ventricular systolic function is normal. The right ventricular size is normal. Tricuspid regurgitation signal is inadequate for assessing PA pressure.  3. The mitral valve is normal in structure. No evidence of mitral valve regurgitation. No evidence of mitral stenosis.  4. The aortic valve is tricuspid. There is mild calcification of the aortic valve. Aortic valve regurgitation is not visualized. No aortic stenosis is present.  5. The inferior vena cava is normal in size with greater than 50% respiratory variability, suggesting right atrial pressure of 3 mmHg. FINDINGS  Left Ventricle: Left ventricular ejection fraction, by estimation, is 60 to 65%. The left ventricle has normal function. The left ventricle has no regional wall motion abnormalities. The left ventricular internal cavity size was normal in size. There is  mild concentric left ventricular hypertrophy. Left ventricular diastolic parameters are consistent with Grade I diastolic dysfunction  (impaired relaxation). Right Ventricle: The right ventricular size is normal. No increase in right ventricular wall thickness. Right ventricular systolic function is normal. Tricuspid regurgitation signal is inadequate for assessing PA pressure. Left Atrium: Left atrial size was normal in size. Right Atrium: Right atrial size was normal in size. Pericardium: There is no evidence of pericardial effusion. Mitral Valve: The mitral valve is normal in structure. There is mild calcification of the mitral valve leaflet(s). Mild mitral annular calcification. No evidence of mitral valve regurgitation. No evidence of mitral valve stenosis. Tricuspid Valve: The tricuspid valve is normal in structure. Tricuspid valve regurgitation is not demonstrated. Aortic Valve: The aortic valve is tricuspid. There is mild calcification of the aortic valve. Aortic valve regurgitation is not visualized. No aortic stenosis is present. Pulmonic Valve: The pulmonic valve was normal in structure. Pulmonic valve regurgitation is not visualized. Aorta: The aortic root is normal in size and structure. Venous: The inferior vena cava is normal in size with greater than 50% respiratory variability, suggesting right atrial pressure of 3 mmHg. IAS/Shunts: No atrial level shunt detected by color flow Doppler.  LEFT VENTRICLE PLAX 2D LVIDd:         4.20 cm LVIDs:         2.40 cm LV PW:         0.90 cm LV IVS:        1.20 cm LVOT diam:     1.60 cm LV SV:  48 LV SV Index:   27 LVOT Area:     2.01 cm  RIGHT VENTRICLE             IVC RV S prime:     20.30 cm/s  IVC diam: 1.30 cm TAPSE (M-mode): 1.6 cm LEFT ATRIUM             Index        RIGHT ATRIUM          Index LA diam:        3.40 cm 1.93 cm/m   RA Area:     9.64 cm LA Vol (A2C):   26.3 ml 14.95 ml/m  RA Volume:   19.10 ml 10.86 ml/m LA Vol (A4C):   27.5 ml 15.64 ml/m LA Biplane Vol: 27.9 ml 15.86 ml/m  AORTIC VALVE LVOT Vmax:   152.00 cm/s LVOT Vmean:  105.000 cm/s LVOT VTI:    0.238 m   AORTA Ao Root diam: 3.00 cm MV E velocity: 79.60 cm/s MV A velocity: 141.00 cm/s  SHUNTS MV E/A ratio:  0.56         Systemic VTI:  0.24 m                             Systemic Diam: 1.60 cm Dalton McleanMD Electronically signed by Franki Monte Signature Date/Time: 08/03/2022/2:41:22 PM    Final    CT Angio Chest Pulmonary Embolism (PE) W or WO Contrast  Result Date: 08/03/2022 CLINICAL DATA:  Pulmonary embolism suspected, positive D-dimer. Undergoing malignancy workup with recent biopsy suggesting lymphoma. EXAM: CT ANGIOGRAPHY CHEST WITH CONTRAST TECHNIQUE: Multidetector CT imaging of the chest was performed using the standard protocol during bolus administration of intravenous contrast. Multiplanar CT image reconstructions and MIPs were obtained to evaluate the vascular anatomy. RADIATION DOSE REDUCTION: This exam was performed according to the departmental dose-optimization program which includes automated exposure control, adjustment of the mA and/or kV according to patient size and/or use of iterative reconstruction technique. CONTRAST:  49m OMNIPAQUE IOHEXOL 350 MG/ML SOLN COMPARISON:  None Available. FINDINGS: Cardiovascular: The heart is borderline enlarged. There is no pericardial effusion. Scattered coronary artery calcifications are noted. There is atherosclerotic calcification of the aorta without evidence of aneurysm. Pulmonary trunk is normal in caliber. No pulmonary artery filling defect is seen. Examination is limited due to respiratory motion artifact and pulmonary nodules. Mediastinum/Nodes: Multiple enlarged lymph nodes are present in the mediastinum and hilar regions bilaterally. No axillary lymphadenopathy. Thyroid gland, trachea, and esophagus are within normal limits. Lungs/Pleura: Hazy regional ground-glass opacities are present in the lungs bilaterally. There are multiple scattered pulmonary nodules bilaterally, the largest in the right lower lobe measuring 3.7 cm, axial image 110  and in the left lower lobe measuring 4.1 cm, axial image 94. No effusion or pneumothorax. Upper Abdomen: Enlarged lymph nodes are present in the gastrohepatic ligament measuring up to 1.1 cm. No acute abnormality. Musculoskeletal: No acute or suspicious osseous abnormality. Review of the MIP images confirms the above findings. IMPRESSION: 1. No evidence of pulmonary embolism. 2. Multiple enlarged mediastinal and hilar lymph nodes and pulmonary nodules bilaterally measuring up to 4.1 cm, concerning for neoplastic process or lymphoma. Correlation with recent biopsy results is recommended. 3. Mild hazy geographic ground-glass opacities in the lungs, possible edema or air trapping. 4. Coronary artery calcifications. 5. Aortic atherosclerosis. Electronically Signed   By: LBrett FairyM.D.   On: 08/03/2022 01:10   MR  BRAIN WO CONTRAST  Result Date: 08/02/2022 CLINICAL DATA:  Altered mental status EXAM: MRI HEAD WITHOUT CONTRAST TECHNIQUE: Multiplanar, multiecho pulse sequences of the brain and surrounding structures were obtained without intravenous contrast. COMPARISON:  04/15/2013 FINDINGS: Brain: Numerous, diffuse bilateral foci of abnormal diffusion restriction in a pattern that is most suggestive of embolic shower but could also indicate watershed ischemia. No acute hemorrhage. There is multifocal hyperintense T2-weighted signal within the white matter. Generalized volume loss. The midline structures are normal. Vascular: Major flow voids are preserved. Skull and upper cervical spine: Normal calvarium and skull base. Visualized upper cervical spine and soft tissues are normal. Sinuses/Orbits:No paranasal sinus fluid levels or advanced mucosal thickening. No mastoid or middle ear effusion. Normal orbits. IMPRESSION: 1. Numerous, diffuse bilateral foci of abnormal diffusion restriction in a pattern that is most suggestive of embolic shower but could also indicate watershed ischemia. 2. No acute hemorrhage or mass  effect. Electronically Signed   By: Ulyses Jarred M.D.   On: 08/02/2022 23:57   CT Head Wo Contrast  Result Date: 08/02/2022 CLINICAL DATA:  Altered mental status, recent diagnosis of lymphoma EXAM: CT HEAD WITHOUT CONTRAST TECHNIQUE: Contiguous axial images were obtained from the base of the skull through the vertex without intravenous contrast. RADIATION DOSE REDUCTION: This exam was performed according to the departmental dose-optimization program which includes automated exposure control, adjustment of the mA and/or kV according to patient size and/or use of iterative reconstruction technique. COMPARISON:  MR brain done on 04/15/2013 FINDINGS: Brain: No acute intracranial findings are seen in noncontrast CT brain. There are no signs of bleeding within the cranium. Ventricles are not dilated. Cortical sulci are prominent. There is no focal edema or mass effect. Vascular: Unremarkable Skull: Unremarkable. Sinuses/Orbits: Unremarkable. Other: None. IMPRESSION: No acute intracranial findings are seen in noncontrast CT brain. Atrophy. Electronically Signed   By: Elmer Picker M.D.   On: 08/02/2022 16:35   DG Chest 2 View  Result Date: 08/02/2022 CLINICAL DATA:  Cough, altered mental status x3 days EXAM: CHEST - 2 VIEW COMPARISON:  04/21/2021 FINDINGS: Cardiac size is within normal limits. Thoracic aorta is tortuous. There are nodular densities of varying sizes scattered throughout both lungs largest overlying the inferior right hilum measuring 2.8 cm. Findings suggest possible metastatic disease. Less likely possibility would be nodular appearing multifocal pneumonia. There is no pleural effusion or pneumothorax. Degenerative changes are noted in both shoulders. IMPRESSION: There are multiple nodular densities of varying sizes in both lungs suggesting pulmonary metastatic disease. Follow-up CT, PET-CT and biopsy as warranted should be considered. Please correlate with clinical history for any known  primary malignancy. Electronically Signed   By: Elmer Picker M.D.   On: 08/02/2022 15:37     PHYSICAL EXAM  Temp:  [97.8 F (36.6 C)-99.5 F (37.5 C)] 98.3 F (36.8 C) (10/19 1247) Pulse Rate:  [91-103] 92 (10/19 1247) Resp:  [18-24] 21 (10/19 1247) BP: (122-134)/(70-87) 127/75 (10/19 1247) SpO2:  [92 %-98 %] 92 % (10/19 1247) FiO2 (%):  [40 %] 40 % (10/18 2050) Weight:  [82.2 kg] 82.2 kg (10/19 0531)  General - Well nourished, well developed, not in acute distress  Ophthalmologic - fundi not visualized due to noncooperation.  Cardiovascular - Regular rhythm and rate  Neuro - eyes open spontaneously. Eyes midline, doll's eyes present, not tracking bilaterally, intermittently blinking to visual threat bilaterally. She was able to say "what" "No" for questions, but able to say "Jozlin" when I called her name, and said "  Ouch" with pain stimulation. Did not answer other questions. Seems able to follow commands on foot movement, but not follow midline commands. No facial droop. Tongue protrusion not cooperative. No movement to pain in UEs and 2/5 knee extension LEs but not able to do knee flexion per request, b/l toe movement 4/5. Sensation, coordination and gait not tested.   ASSESSMENT/PLAN Ms. ANAE HAMS is a 66 y.o. female with history of recent diagnosed lymphoma, recent MRSA infection admitted for altered mental status, lethargy and not talking. No tPA given due to outside window.    Stroke:  bilateral embolic shower, etiology not quite clear, however could be due to distal DVT in the setting of small PFO or viscosity of blood from lymphoma with significant leukocytosis CT no acute abnormality MRI 95/63 embolic shower bilaterally CTA chest no PE CT head and neck and CTV unremarkable MRI 10/13 extension of embolic shower bilaterally 2D Echo EF 60 to 65% LE venous Doppler left peroneal vein DVT TEE showed small PFO LDL 91 HgbA1c 6.0 Ammonia level normal Heparin IV for  VTE prophylaxis No antithrombotic prior to admission, now on heparin IV.  On amantadine 200 bid for arousal  Ongoing aggressive stroke risk factor management Therapy recommendations: CIR Disposition: Pending, palliative care on board, family meeting 10/19  Hypertension Stable Long term BP goal normotensive  Hyperlipidemia Home meds: Zetia LDL 91, goal < 70 Intolerance to statin Continue zetia at discharge  Fever and cough, resolved Tmax 101.4->afebrile->99.8-> afebrile Intermittent coughing, improved Blood culture negative, urine culture showed pansensitive E. Coli Off Rocephin, Flagyl and vancomycin now CXR Mild left basilar subsegmental atelectasis is noted with small left pleural effusion S/p BiPAP and much improved CCM on board  Dysphagia Did not pass swallow n.p.o. now Speech on board cortrak placed for nutrition  PFO LE DVT Small PFO on TEE LE venous Doppler left peroneal vein DVT On heparin IV  Lymphoma, untreated Recent diagnosis at [facility] Dr. Lorenso Courier on board WBC 48.8->48.2->46.3->55.1->63.8->64.6->65.0->68.3->44.1 Viscosity of the blood can be part of the stroke etiology  Other Stroke Risk Factors Advanced age  Other Active Problems Recent MRSA infection, completed vancomycin course  Hospital day # 8    Rosalin Hawking, MD PhD Stroke Neurology 08/11/2022 3:06 PM  I had long discussion with son at bedside, updated pt current condition, treatment plan and potential prognosis, and answered all the questions. he expressed understanding and appreciation.    To contact Stroke Continuity provider, please refer to [email or web address] After hours, contact General Neurology

## 2022-08-11 NOTE — Progress Notes (Signed)
NAME:  Sarah Walsh, MRN:  [id number], DOB:  04-Jun-1956, LOS: 8 ADMISSION DATE:  08/02/2022 CONSULTATION DATE:  08/10/2022 REFERRING MD:  Florene Glen - [facility] CHIEF COMPLAINT:  Respiratory failure, AMS  History of Present Illness:  66 year old woman who presented to [facility] 10/10 with AMS x 4 days. 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 Per patient's daughter, more confused over several days PTA with balance issues and c/o diffuse pruritus x several weeks.  ED workup 10/10 was notable for fever to Tmax 38.2C and severe leukocytosis with WBC 54 and eosinophilia, LA 2.1. CT Head with NAICA; CXR with multiple nodules c/f metastatic disease. Broad-spectrum antibiotics were initiated and Oncology was consulted.   MRI Brain 10/10 with numerous diffuse bilateral foci of abnormal diffusion restriction with pattern c/f embolic shower versus watershed ischemia. Neurology was consulted. CTA Chest was completed 10/11 due to concern for PE and was negative for PE; demonstrated multiple mediastinal/hilar LN/pulmonary nodules up to 4.1cm c/f neoplastic process/lymphoma. LE dopplers demonstrated acute DVT in the peroneal veins of the L calf. CTA Head/Neck 10/12 with no LVO/significant stenosis, concern for dural venous sinus thrombosis; however CT Venogram negative. Repeat MRI 10/13 with demonstration of extensive acute infarcts in the cerebral/cerebellar hemispheres, favored embolic, increased from prior. TEE was completed 10/13 showing small PFO, no thrombus or vegetation. IV Heparin was started. Given ongoing encephalopathy, EEG completed 10/14 with triphasic GPDs, more indicative of toxic metabolic causes, no seizures. Antibiotics were narrowed to ceftriaxone/flagyl/vanc and cefepime discontinued given concern for lowering seizure threshold. Bicarb gtt was started and subsequently  discontinued after concern for pulmonary edema. Cortrak was placed 10/16 for nutritional support. Given poor neurologic improvement in the setting of lymphoma with likely metastatic disease, PMT consulted 10/18 with plan for family [place] discussion 10/19.  On 10/18PM, patient experienced increased WOB with mild tachypnea requiring BiPAP initiation. PCCM consulted for respiratory failure and possible need for airway.  Pertinent Medical History:   Past Medical History:  Diagnosis Date   Anemia    Arthritis    bilateral knees   Colon polyp 05/29/2019   Depression    on meds-situational anxiety   Diabetes mellitus without complication ([place])    on meds   Hyperlipemia    on meds   Hypertension    hx of    Osteopenia 04/23/2021   DEXA 03/2021: lowest T = -2.0, osteeopenia. Recheck 2 years. [facility] Events: Including procedures, antibiotic start and stop dates in addition to other pertinent events   10/10 - Admitted with AMS, fever, leukocytosis to 54.6. CXR with multiple nodules c/f metastatic disease. CT Head NAICA. MRI Brain with numerous diffuse bilateral foci of abnormal diffusion restriction with pattern c/f embolic shower versus watershed ischemia. 10/11 - CTA Chest negative for PE; demonstrated multiple mediastinal/hilar LN/pulmonary nodules up to 4.1cm c/f neoplastic process/lymphoma. LE Dopplers with acute DVT L peroneal veins. 10/12 - CTA Head/Neck with no LVO/significant stenosis, concern for dural venous sinus thrombosis; however CT Venogram negative. 10/13 - Repeat MRI with demonstration of extensive acute infarcts in the cerebral/cerebellar hemispheres, favored embolic, increased from prior. TEE showing small PFO, no thrombus or vegetation. IV Heparin was started. 10/14 - EEG with triphasic GPDs, more indicative of toxic metabolic causes, no seizures. Antibiotics narrowed; ceftriaxone/flagyl/vanc. Cefepime discontinued given concern for lowering seizure threshold.  Bicarb gtt was started and subsequently discontinued after concern for  pulmonary edema. 10/16 - Cortrak placed for nutritional support 10/18 - PCCM consulted for respiratory failure. PMT consulted for [place] discussions. 10/19 - DNR per PMT, family states patient would not want ICU/intubation. Transitioned from BiPAP to [facility]. SpO2 >95%. Following simple commands, speaking 1-2 word phrases.  Interim History / Subjective:  No significant events overnight Remained on BiPAP since 10/18 Transitioned to 4LNC, tolerated well with SpO2 > 95% Aphasic versus catatonic, more like aphasic in the setting of strokes Speaking 1-2 word phrases and unreliably following some simple commands Will wiggle toes in BLE, will not squeeze/lift BUE; sticking tongue out on command. DNR code status per PMT/family, patient would not want ICU admission or intubation  Objective:  Blood pressure 128/80, pulse 93, temperature 99 F (37.2 C), temperature source Axillary, resp. rate 19, height '5\' 3"'$  (1.6 m), weight 82.2 kg, SpO2 94 %.    FiO2 (%):  [40 %] 40 %   Intake/Output Summary (Last 24 hours) at 08/11/2022 1022 Last data filed at 08/11/2022 0531 Gross per 24 hour  Intake 531.91 ml  Output 2400 ml  Net -1868.09 ml    Filed Weights   08/09/22 0429 08/10/22 0449 08/11/22 0531  Weight: 82.8 kg 78.5 kg 82.2 kg   Physical Examination: General: Acute-on-chronically ill-appearing elderly woman in NAD. HEENT: [place]/AT, anicteric sclera, PERRL, dry mucous membranes. Cortrak in place. BiPAP mask in place. Staring forward. Neuro:  Awake, somewhat lethargic. Unable to assess orientation as patient only saying a few words. Grossly aphasic, will say a few words ("yeah", "ok", "ouch", "thank you")  Responds to verbal, tactile and noxious stimuli. Following commands intermittently. Moves BLE spontaneously. CV: RRR, no m/g/r. PULM: Breathing even and unlabored on BiPAP, transitioned to [facility] during my exam with no increased WOB.  Lung fields diminished bilaterally with faint bibasilar crackles. GI: Soft, nontender, nondistended. Normoactive bowel sounds. Extremities: Trace bilateral symmetric LE edema noted. Trace dependent edema of bilateral hands. Skin: Warm/dry, generalized mild facial erythema/irritation 2/2 BiPAP mask.  Resolved Hospital Problem List:    Assessment & Plan:  Acute respiratory failure with hypoxia Multifocal pneumonia: Completed course of rocephin, vanc, flagyl x 7 days - Continue supplemental O2 support - BiPAP PRN + QHS - Wean FiO2 for sats > 92% - Continue Lasix - Continue Solumedrol - Bronchodilators PRN - Pulmonary hygiene - Follow CXR - Discussed intubation with family; after discussion with PMT family agreed that patient's wishes would be for DNR code status; she would not want ICU admission or intubation - DNR code status in the setting of lymphoma diagnosis/pulmonary involvement and poor prognosis   RLE DVT PFO CTA Chest 10/11 negative for PE; TEE 10/13 with small PFO, no vegetation/thrombus. 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 - Neuro/Stroke following - Frequent neuro checks - Currently protecting airway, would NOT want intubation per family - Remains grossly aphasic - PT/OT/SLP when able to participate in care   T-cell lymphoma Eosinophilic leukocytosis B/l pulmonary nodules Follows as outpatient with [facility]. Reportedly had recent cervical LN excisional biopsy with path demonstrating T-cell lymphoma. F/u with Onc 10/13 missed due to hospitalization. - Will require outpatient oncology f/u with [facility] at  discharge   Sepsis HTN HLD PSVT Hypokalemia Metabolic acidosis Dark  urine - Per Primary Team  PCCM will sign off at this time, as patient's respiratory status remains stable. Patient's family (after meeting with PMT) determined that patient would NOT want ICU admission or intubation. Would continue current respiratory management.  Thank you for involving [place] in this patient's care. Please do not hesitate to reach out if PCCM can be of further assistance.  Best Practice: (right click and "Reselect all SmartList Selections" daily)   Per Primary Team  Signature:   Rhae Lerner Cordry [facility] 08/11/22 10:22 AM  Please see [email or web address] for pager details.  From 7A-7P if no response, please call [phone] After hours, please call ELink [phone]

## 2022-08-11 NOTE — Consult Note (Signed)
Consultation Note Date: 08/11/2022   [name]  DOB: 05/31/1956  MRN: [id number]  Age / Sex: 66 y.o., female  PCP: Sueanne Margarita, DO Referring Physician: Elodia Florence., *  Reason for Consultation: Establishing goals of care  HPI/Patient Profile: 66 y.o. female  with past medical history of HTN, HLD, T2DM, anemia, arthritis, remote history of melanoma, and recent diagnosis of T-cell lymphoma admitted on 08/02/2022 with altered mental status.  MRI brain was positive for scattered cortical and subcortical infarcts.  Patient now with acute hypoxic respiratory failure requiring BiPAP.  Respiratory status improved with IV Lasix.  Patient with ongoing acute metabolic encephalopathy.  She occasionally verbalizes a few words.  She is unable to move her upper extremities.  Patient requiring core track for artificial nutrition.  Chest x-ray 10/18 with bilateral pulmonary nodules concerning for metastatic disease.  PMT consulted to discuss goals of care.  Clinical Assessment and Goals of Care: In I have reviewed medical records including EPIC notes, labs and imaging, received report from RN, assessed the patient and then met with patient's family to include daughter Sarah Walsh son Sarah Walsh and daughter-in-law [name] to discuss diagnosis prognosis, [place], EOL wishes, disposition and options.  I introduced Palliative Medicine as specialized medical care for people living with serious illness. It focuses on providing relief from the symptoms and stress of a serious illness. The goal is to improve quality of life for both the patient and the family.  We discussed a brief life review of the patient.  Family shares prior to hospital admission patient was living independently.  They tell me she walked without assistance -occasionally a bit unsteady.  They tell me she was cognitively intact.  They tell me her appetite was okay though it had declined some  prior to her diagnosis of lymphoma.  Family shares about lymph node biopsy 9/28 and receiving cancer diagnosis prior to admission.  They tell me of plans for follow-up with oncology however patient was admitted prior to this appointment.    We discussed patient's current illness and what it means in the larger context of patient's on-going co-morbidities.  Natural disease trajectory and expectations at EOL were discussed.  We discussed patient's diagnosis of lymphoma with concern for pulmonary mets.  We discussed her strokes and functional impairment.  We discussed her ongoing metabolic encephalopathy and dependence on core track for artificial nutrition at this point.  We discussed respiratory failure and current need for BiPAP.  I attempted to elicit values and goals of care important to the patient.  Family shares quality of life is very important to patient and she would not want to live in a nursing home or want to live in a state where she was dependent on others to care for her.  We discussed uncertainty of current situation and concern of poor outcome or at least inability to obtain status that patient would consider acceptable quality of life.  The difference between aggressive medical intervention and comfort care was considered in light of the patient's goals of care.   Advance directives, concepts specific to code status, artificial feeding and hydration, and rehospitalization were considered and discussed.  Encouraged family to consider DNR/DNI status understanding evidenced based poor outcomes in similar hospitalized patients, as the cause of the arrest is likely associated with chronic/terminal disease rather than a reversible acute cardio-pulmonary event.  Family agrees DNR/DNI is appropriate.  Discussed with family the importance of continued conversation with family and the medical providers regarding  overall plan of care and treatment options, ensuring decisions are within the  context of the patients values and GOCs.    We made a plan to draw a current boundary of DNR, no intubation, no ICU admission but continue other measures and allow time for family to receive more information from medical team -they are specifically interested in speaking to neurology about expectations moving forward.  We did discuss possibility of transitioning to comfort measures in the future and detailed what this could look like.  All questions and concerns were addressed.  Questions and concerns were addressed. The family was encouraged to call with questions or concerns.  Primary Decision Maker Next of kin -2 children Sarah Walsh and Sarah Walsh    SUMMARY OF RECOMMENDATIONS   - CODE STATUS changed to DNR/DNI, avoid ICU admission - Continue other measures for now to allow time for family to discuss situation with the rest of the medical team -they are specifically interested in speaking to neurology about expectations moving forward, I have reached out to Dr. Erlinda Hong who plans to speak with them later today -Family notes that quality of life is very important to patient and she would not want to live in a state where she is dependent on others to care for her/requires nursing home care -PMT will follow for ongoing goals of care conversation  **Family request that difficult conversations be held outside of the patient's room and not in front of patient **Family shares patient has ex-husband who is involved with situation and they are agreeable to him having medical information and all of his questions answered but do not want him making medical decisions for patient (he occasionally introduces himself as husband as they were married for a long time but they are no longer married)  Code Status/Advance Care Planning: DNR      Primary Diagnoses: Present on Admission:  Lymphoma ([place])  Essential hypertension  DVT (deep venous thrombosis) ([place])  Acute metabolic encephalopathy  Acute CVA  (cerebrovascular accident) ([place])  Sepsis ([place])   I have reviewed the medical record, interviewed the patient and family, and examined the patient. The following aspects are pertinent.  Past Medical History:  Diagnosis Date   Anemia    Arthritis    bilateral knees   Colon polyp 05/29/2019   Depression    on meds-situational anxiety   Diabetes mellitus without complication ([place])    on meds   Hyperlipemia    on meds   Hypertension    hx of    Osteopenia 04/23/2021   DEXA 03/2021: lowest T = -2.0, osteeopenia. Recheck 2 years. Ca/D   Social History   Socioeconomic History   Marital status: Single    Spouse name: Not on file   Number of children: Not on file   Years of education: Not on file   Highest education level: Not on file  Occupational History   Occupation: Retired  Tobacco Use   Smoking status: Former    Years: 34.00    Types: Cigarettes    Quit date: 1985    Years since quitting: 38.8   Smokeless tobacco: Never  Vaping Use   Vaping Use: Never used  Substance and Sexual Activity   Alcohol use: No   Drug use: No   Sexual activity: Not on file  Other Topics Concern   Not on file  Social History Narrative   Not on file   Social Determinants of Health   Financial Resource Strain: Not on file  Food  Insecurity: Not on file  Transportation Needs: Not on file  Physical Activity: Not on file  Stress: Not on file  Social Connections: Not on file   Family History  Problem Relation Age of Onset   Alzheimer's disease Mother    Diabetes Daughter    Healthy Daughter    Diabetes Father    Colon polyps Father    Healthy Daughter    Healthy Daughter    Healthy Son    Esophageal cancer Neg Hx    Rectal cancer Neg Hx    Stomach cancer Neg Hx    Colon cancer Neg Hx    Scheduled Meds:  acetaminophen  650 mg Per Tube Q6H   Or   acetaminophen  650 mg Rectal Q6H   amantadine  200 mg Per Tube BID   docusate  100 mg Per Tube QHS   ezetimibe  10 mg Per Tube Daily    feeding supplement (PROSource TF20)  60 mL Per Tube Daily   methylPREDNISolone (SOLU-MEDROL) injection  40 mg Intravenous Daily   metoprolol tartrate  25 mg Per Tube BID   sennosides  5 mL Per Tube QHS   sodium chloride flush  3 mL Intravenous Q12H   Continuous Infusions:  sodium chloride Stopped (08/11/22 0008)   feeding supplement (JEVITY 1.5 CAL/FIBER) 1,000 mL (08/10/22 0200)   heparin 1,400 Units/hr (08/11/22 0009)   PRN Meds:.sodium chloride, albuterol, diphenhydrAMINE, hydrALAZINE, lip balm, ondansetron **OR** ondansetron (ZOFRAN) IV Allergies  Allergen Reactions   Statins Other (See Comments)    myalgia Other reaction(s): Other (See Comments) Other Reaction(s): Other  myalgia Other Reaction(s): Other  myalgia    Codeine Itching and Other (See Comments)   Review of Systems  Unable to perform ROS: Patient nonverbal    Physical Exam Constitutional:      General: She is not in acute distress.    Appearance: She is ill-appearing.     Comments: Eyes open, wiggles toes to command occasionally, does not verbalize, unable to move upper extremities  Pulmonary:     Comments: Remains on BiPAP, not in distress Skin:    General: Skin is warm and dry.     Vital Signs: BP 127/75 (BP Location: Left Arm)   Pulse 92   Temp 98.3 F (36.8 C) (Temporal)   Resp (!) 21   Ht _0  (1.6 m)   Wt 82.2 kg   SpO2 92%   BMI 32.10 kg/m  Pain Scale: Faces   Pain Score: 0-No pain   SpO2: SpO2: 92 % O2 Device:SpO2: 92 % O2 Flow Rate: .O2 Flow Rate (L/min): 4 L/min  IO: Intake/output summary:  Intake/Output Summary (Last 24 hours) at 08/11/2022 1320 Last data filed at 08/11/2022 1130 Gross per 24 hour  Intake 531.91 ml  Output 2900 ml  Net -2368.09 ml    LBM: Last BM Date : 08/10/22 Baseline Weight: Weight: 72.6 kg Most recent weight: Weight: 82.2 kg       *Please note that this is a verbal dictation therefore any spelling or grammatical errors are due to the "[employer]" system interpretation.  Time Total: 140 minutes  Greater than 50%  of this time was spent counseling and coordinating care related to the above assessment and plan.  Juel Burrow, DNP, AGNP-C Palliative Medicine Team [phone] Pager: [phone]

## 2022-08-11 NOTE — Progress Notes (Signed)
PROGRESS NOTE    LYNNSEY BARBARA  ZOX:[id number] DOB: 07-05-1956 DOA: 08/02/2022 PCP: Sueanne Margarita, DO  Chief Complaint  Patient presents with   Altered Mental Status    Brief Narrative:  Sarah Walsh is Sarah Walsh 66 y.o. female with Sarah Walsh history of T2DM, HLD, HTN, recent cervical LN biopsy with +lymphoma reportedly T cell, not yet started on treatment, presented to ED 10/10 with lethargy, confusion, anorexia, gait instability. MRI brain was positive for scattered cortical and subcortical infarcts. She was admitted, also noted to have fevers, WBC 81.1B with eosinophilic predominance.    Repeat MRI due to worsening level of consciousness showed extension of embolic shower strokes. EEG nonspecific without seizure recorded. Broad IV antibiotics have been continues empirically though no nidus of infection has been identified. Goals of care discussions are ongoing. Palliative care is consulted to assist.   Assessment & Plan:   Principal Problem:   Sepsis ([place]) Active Problems:   Essential hypertension   Encephalopathy acute   Lymphoma ([place])   DVT (deep venous thrombosis) (HCC)   Acute metabolic encephalopathy   Acute CVA (cerebrovascular accident) ([place])   Malnutrition of moderate degree   Acute respiratory failure with hypoxia (HCC)   Assessment and Plan: Acute Hypoxic Respiratory Failure Increased wob today with diminished breath sounds, expiratory wheezing Prior CT showed no PE (10/11 imaging), but did show multiple enlarged mediastinal and hilar LN's and pulm nodules measuring up to 4.1 CM as well as mild hazy geographic ggo's in the lungs, possible edema or air trapping CXR 10/18 with bilateral pulm nodules concerning for metastatic disease No true fevers, leukocytosis related to lymphoma Required bipap 10/18 PCCM consulted, now signed off.   Will continue lasix, steroids (short course).  Can use bipap as needed for wob.  Low threshold for repeat imaging, restarting abx.  Sepsis:  Patient met sepsis criteria on admission with tachycardia, fevers, leukocytosis, lactic acidosis, source unclear.   - afebrile at this point, leukocytosis related to lymphoma, low threshold to restart abx in setting of above, but lower suspicion for infectious cause at this point - bcx NGTD from 10/10. DDx fever includes due to lymphoma, central, less likely DVT in and of itself. - With negative cultures, completed ceftriaxone 2g q24h, vancomycin, flagyl x7 days (10/16). - Fungitell negative - Scheduled tylenol - Fever curve improved   Acute metabolic encephalopathy: Most likely related to widespread embolic CVAs.  - EEG with nonspecific changes without seizure activity noted initially. EEG repeated and unchanged 10/15. Anticipate some waxing/waning.  - Amantadine started per neurology for arousal - Limited neurological recovery at this time. Appreciate neurology recommendations.   Extensive bilateral cortical and subcortical ischemic CVAs: MRI brain showed numerous diffuse bilateral foci of abnormal diffusion restriction in Walker Sitar pattern most suggestive of embolic shower but could also indicate watershed ischemia.  - repeat MRI 10/13 showed extensive acute infarcts throughout both cerebral and cerebellar hemispheres across multiple vascular distributions again favored embolic (with ddx including watershed ischemia) - increased in extent from MRI done previously  - PFO found on TEE. In conjunction with know DVT (albeit distal) and embolic distribution, started heparin, goal level 0.3-0.5.  - Neuro checks to continue - Cortrak inserted 10/16 - holding tube feeds at this point with respiratory distress requiring bipap, consider resumption tomorrow - We are pursing palliative care discussions with the family daily. They are very involved and have Elster Corbello realistic understanding of expected poor prognosis, though it is ultimate status is yet to be determined. Will  consult palliative for ongoing discussions.      Dark urine: Due to bilirubinuria due to lymphoma. No WBCs on repeat UA.    Metabolic acidosis: No anion gap. Resolved.   Hypokalemia:  - improved   PSVT: Burden decreased on repeat tele review today.  - Convert IV to enteral metoprolol. BP tolerating.   HTN:  - BP goal per neurology, prn hydralazine ordered   Acute RLE peroneal DVT - CTA negative for PE.  - Heparin as discussed above.  - Interestingly, per Dr. Hadley Pen discussion with heme/onc, Dr. Lorenso Courier, lymphomas are not classically associated with hypercoagulability. The severity of leukocytosis is also not near the expected threshold to cause leukostasis.    T-cell lymphoma: Recent diagnosis, has not been started on treatment yet.   - Oncology, Dr. Lorenso Courier consulted. Of course the patient is not currently Anavi Branscum candidate for treatment and, given her normal cell counts otherwise, doubt there'd be any benefit to pulsed steroids at this time. There would certainly be risks as well. The family is aware of this discussion. - There is no further work up or treatment currently planned as an inpatient for this issue.    Eosinophilic leukocytosis:  - suspect this is related to her T cell lymphoma. Note subtypes of T cell lymphomas can aberrantly express IL-5.  - Work up has not suggested nephritis, hepatitis, She has no history of this prior to diagnosis, no ongoing drug/allergic reaction, no parasitic infection detected. Could be expected at the time of her drug eruption after Tx for MRSA recently though that agent was stopped and rash has resolved durably. HIV NR - Will plan to monitor CBC w/differential. Note stable eosinophilia, with normal PMNs today.     DVT prophylaxis: heparin Code Status: DNR after conversation with palliative Family Communication: none today Disposition:   Status is: Inpatient Remains inpatient appropriate because: continued need for inpatient care   Consultants:  PCCM Oncology Neurology  Procedures:   TEE IMPRESSIONS     1. Left ventricular ejection fraction, by estimation, is 55 to 60%. The  left ventricle has normal function.   2. Right ventricular systolic function is normal. The right ventricular  size is normal. There is moderately elevated pulmonary artery systolic  pressure.   3. No left atrial/left atrial appendage thrombus was detected.   4. The mitral valve is normal in structure. Mild mitral valve  regurgitation.   5. The aortic valve is tricuspid. Aortic valve regurgitation is not  visualized.   6. Evidence of atrial level shunting detected by color flow Doppler.  Agitated saline contrast bubble study was positive with shunting observed  within 3-6 cardiac cycles suggestive of interatrial shunt. Few bubbles  seen in left atrium after patient  coughed, suggesting small PFO. Small PFO confirmed visually with left to  right shunting.   7. No vegetation seen   Echo IMPRESSIONS     1. Left ventricular ejection fraction, by estimation, is 60 to 65%. The  left ventricle has normal function. The left ventricle has no regional  wall motion abnormalities. There is mild concentric left ventricular  hypertrophy. Left ventricular diastolic  parameters are consistent with Grade I diastolic dysfunction (impaired  relaxation).   2. Right ventricular systolic function is normal. The right ventricular  size is normal. Tricuspid regurgitation signal is inadequate for assessing  PA pressure.   3. The mitral valve is normal in structure. No evidence of mitral valve  regurgitation. No evidence of mitral stenosis.   4. The  aortic valve is tricuspid. There is mild calcification of the  aortic valve. Aortic valve regurgitation is not visualized. No aortic  stenosis is present.   5. The inferior vena cava is normal in size with greater than 50%  respiratory variability, suggesting right atrial pressure of 3 mmHg.  LE [place] Summary:  RIGHT:  - There is no evidence of deep vein  thrombosis in the lower extremity.     - No cystic structure found in the popliteal fossa.     - Ultrasound characteristics of enlarged lymph nodes are noted in the  groin.     LEFT:  - Findings consistent with acute deep vein thrombosis involving the left  peroneal veins.     - No cystic structure found in the popliteal fossa.     - Ultrasound characteristics of enlarged lymph nodes noted in the groin.      Antimicrobials:  Anti-infectives (From admission, onward)    Start     Dose/Rate Route Frequency Ordered Stop   08/08/22 2100  cefTRIAXone (ROCEPHIN) 2 g in sodium chloride 0.9 % 100 mL IVPB        2 g 200 mL/hr over 30 Minutes Intravenous Every 24 hours 08/08/22 1818 08/09/22 0130   08/06/22 0200  vancomycin (VANCOCIN) IVPB 1000 mg/200 mL premix  Status:  Discontinued        1,000 mg 200 mL/hr over 60 Minutes Intravenous Every 12 hours 08/05/22 1934 08/08/22 1818   08/05/22 2100  cefTRIAXone (ROCEPHIN) 2 g in sodium chloride 0.9 % 100 mL IVPB  Status:  Discontinued        2 g 200 mL/hr over 30 Minutes Intravenous Every 24 hours 08/05/22 2029 08/08/22 1818   08/03/22 1000  metroNIDAZOLE (FLAGYL) IVPB 500 mg  Status:  Discontinued        500 mg 100 mL/hr over 60 Minutes Intravenous Every 12 hours 08/02/22 2206 08/08/22 1818   08/03/22 0600  ceFEPIme (MAXIPIME) 2 g in sodium chloride 0.9 % 100 mL IVPB  Status:  Discontinued        2 g 200 mL/hr over 30 Minutes Intravenous Every 8 hours 08/02/22 1918 08/05/22 2028   08/03/22 0330  vancomycin (VANCOREADY) IVPB 750 mg/150 mL  Status:  Discontinued        750 mg 150 mL/hr over 60 Minutes Intravenous Every 12 hours 08/02/22 1918 08/05/22 1934   08/02/22 1930  vancomycin (VANCOREADY) IVPB 1500 mg/300 mL        1,500 mg 150 mL/hr over 120 Minutes Intravenous  Once 08/02/22 1915 08/02/22 2207   08/02/22 1915  ceFEPIme (MAXIPIME) 2 g in sodium chloride 0.9 % 100 mL IVPB  Status:  Discontinued        2 g 200 mL/hr over 30 Minutes  Intravenous  Once 08/02/22 1900 08/02/22 1918   08/02/22 1915  metroNIDAZOLE (FLAGYL) IVPB 500 mg        500 mg 100 mL/hr over 60 Minutes Intravenous  Once 08/02/22 1900 08/02/22 2135   08/02/22 1915  vancomycin (VANCOCIN) IVPB 1000 mg/200 mL premix  Status:  Discontinued        1,000 mg 200 mL/hr over 60 Minutes Intravenous  Once 08/02/22 1900 08/02/22 1915       Subjective: nonverbal  Objective: Vitals:   08/11/22 0833 08/11/22 1129 08/11/22 1247 08/11/22 1609  BP: 128/80 130/74 127/75 131/82  Pulse: 93 93 92 99  Resp: 19 20 (!) 21 (!) 21  Temp: 99 F (37.2 C) 98.8  F (37.1 C) 98.3 F (36.8 C) 99 F (37.2 C)  TempSrc: Axillary Axillary Temporal Axillary  SpO2: 94% 96% 92% 92%  Weight:      Height:        Intake/Output Summary (Last 24 hours) at 08/11/2022 1717 Last data filed at 08/11/2022 1130 Gross per 24 hour  Intake 531.91 ml  Output 2100 ml  Net -1568.09 ml   Filed Weights   08/09/22 0429 08/10/22 0449 08/11/22 0531  Weight: 82.8 kg 78.5 kg 82.2 kg    Examination:  General: No acute distress. Cardiovascular: RRR Lungs: comfortable on bipap Abdomen: Soft, nontender, nondistended  Neurological: opens eyes when I say her name, otherwise, does not follow commands Extremities: No clubbing or cyanosis. No edema.   Data Reviewed: I have personally reviewed following labs and imaging studies  CBC: Recent Labs  Lab 08/07/22 0618 08/08/22 0152 08/09/22 0458 08/10/22 0525 08/11/22 0149  WBC 63.8* 64.6* 65.0* 68.3* 44.1*  NEUTROABS 13.4* 7.7 7.8* 9.6* 18.5*  HGB 12.9 12.1 12.6 11.9* 11.5*  HCT 36.2 35.0* 38.6 35.6* 34.1*  MCV 93.3 95.1 98.5 97.5 95.8  PLT 208 208 221 241 035    Basic Metabolic Panel: Recent Labs  Lab 08/07/22 0618 08/08/22 0152 08/08/22 1701 08/09/22 0458 08/09/22 1606 08/10/22 0525 08/11/22 0149  NA 136 139  --  138  --  137 135  K 3.2* 3.3*  --  3.6  --  3.5 4.4  CL 104 103  --  103  --  104 99  CO2 23 23  --  24  --  26  27  GLUCOSE 153* 124*  --  173*  --  206* 192*  BUN 14 14  --  18  --  16 16  CREATININE 0.68 0.64  --  0.63  --  0.62 0.62  CALCIUM 7.8* 8.0*  --  8.1*  --  8.0* 8.0*  MG  --   --  2.1 2.1 2.1 2.1 2.0  PHOS  --   --  1.9* 1.9* 2.0* 1.9* 2.1*    GFR: Estimated Creatinine Clearance: 68.3 mL/min (by C-G formula based on SCr of 0.62 mg/dL).  Liver Function Tests: Recent Labs  Lab 08/09/22 0458 08/11/22 0149  AST 18 16  ALT 11 11  ALKPHOS 76 66  BILITOT 0.1* 0.4  PROT 5.7* 5.5*  ALBUMIN 2.1* 2.0*    CBG: Recent Labs  Lab 08/10/22 2016 08/11/22 0150 08/11/22 0632 08/11/22 0755 08/11/22 1125  GLUCAP 250* 194* 147* 138* 125*     Recent Results (from the past 240 hour(s))  Resp Panel by RT-PCR (Flu [place] Salamone&B, Covid) Anterior Nasal Swab     Status: None   Collection Time: 08/02/22  3:10 PM   Specimen: Anterior Nasal Swab  Result Value Ref Range Status   SARS Coronavirus 2 by RT PCR NEGATIVE NEGATIVE Final    Comment: (NOTE) SARS-CoV-2 target nucleic acids are NOT DETECTED.  The SARS-CoV-2 RNA is generally detectable in upper respiratory specimens during the acute phase of infection. The lowest concentration of SARS-CoV-2 viral copies this assay can detect is 138 copies/mL. Andie Mungin negative result does not preclude SARS-Cov-2 infection and should not be used as the sole basis for treatment or other patient management decisions. Abhishek Levesque negative result may occur with  improper specimen collection/handling, submission of specimen other than nasopharyngeal swab, presence of viral mutation(s) within the areas targeted by this assay, and inadequate number of viral copies(<138 copies/mL). Selestino Nila negative result must be combined  with clinical observations, patient history, and epidemiological information. The expected result is Negative.  Fact Sheet for Patients:  EntrepreneurPulse.com.au  Fact Sheet for Healthcare Providers:  IncredibleEmployment.be  This  test is no t yet approved or cleared by the [place] FDA and  has been authorized for detection and/or diagnosis of SARS-CoV-2 by FDA under an Emergency Use Authorization (EUA). This EUA will remain  in effect (meaning this test can be used) for the duration of the COVID-19 declaration under Section 564(b)(1) of the Act, 21 U.S.C.section 360bbb-3(b)(1), unless the authorization is terminated  or revoked sooner.       Influenza Remy Dia by PCR NEGATIVE NEGATIVE Final   Influenza B by PCR NEGATIVE NEGATIVE Final    Comment: (NOTE) The Xpert Xpress SARS-CoV-2/FLU/RSV plus assay is intended as an aid in the diagnosis of influenza from Nasopharyngeal swab specimens and should not be used as Lailee Hoelzel sole basis for treatment. Nasal washings and aspirates are unacceptable for Xpert Xpress SARS-CoV-2/FLU/RSV testing.  Fact Sheet for Patients: EntrepreneurPulse.com.au  Fact Sheet for Healthcare Providers: IncredibleEmployment.be  This test is not yet approved or cleared by the [place] FDA and has been authorized for detection and/or diagnosis of SARS-CoV-2 by FDA under an Emergency Use Authorization (EUA). This EUA will remain in effect (meaning this test can be used) for the duration of the COVID-19 declaration under Section 564(b)(1) of the Act, 21 U.S.C. section 360bbb-3(b)(1), unless the authorization is terminated or revoked.  Performed at [facility], [place] [address] [place], [place] [address]   Blood culture (routine x 2)     Status: None   Collection Time: 08/02/22  3:16 PM   Specimen: BLOOD  Result Value Ref Range Status   Specimen Description BLOOD LEFT ANTECUBITAL  Final   Special Requests   Final    BOTTLES DRAWN AEROBIC AND ANAEROBIC Blood Culture results may not be optimal due to an inadequate volume of blood received in culture bottles   Culture   Final    NO GROWTH 5 DAYS Performed at [facility], [place] [address]  [place], [place] [address]    Report Status 08/07/2022 FINAL  Final  Blood culture (routine x 2)     Status: None   Collection Time: 08/02/22  7:02 PM   Specimen: BLOOD  Result Value Ref Range Status   Specimen Description BLOOD LEFT ANTECUBITAL  Final   Special Requests   Final    BOTTLES DRAWN AEROBIC AND ANAEROBIC Blood Culture adequate volume   Culture   Final    NO GROWTH 5 DAYS Performed at [facility], [place] [address] [place], [place] [address]    Report Status 08/07/2022 FINAL  Final  Urine Culture     Status: Abnormal   Collection Time: 08/02/22  9:37 PM   Specimen: Urine, Clean Catch  Result Value Ref Range Status   Specimen Description URINE, CLEAN CATCH  Final   Special Requests   Final    NONE Performed at [facility], [place] [address] [place], [place] [address]    Culture 10,000 COLONIES/mL ESCHERICHIA COLI (Arsal Tappan)  Final   Report Status 08/05/2022 FINAL  Final   Organism ID, Bacteria ESCHERICHIA COLI (Jamani Eley)  Final      Susceptibility   Escherichia coli - MIC*    AMPICILLIN 8 SENSITIVE Sensitive     CEFAZOLIN <=4 SENSITIVE Sensitive     CEFEPIME <=0.12 SENSITIVE Sensitive     CEFTRIAXONE <=0.25 SENSITIVE  Sensitive     CIPROFLOXACIN <=0.25 SENSITIVE Sensitive     GENTAMICIN <=1 SENSITIVE Sensitive     IMIPENEM <=0.25 SENSITIVE Sensitive     NITROFURANTOIN <=16 SENSITIVE Sensitive     TRIMETH/SULFA <=20 SENSITIVE Sensitive     AMPICILLIN/SULBACTAM 4 SENSITIVE Sensitive     PIP/TAZO <=4 SENSITIVE Sensitive     * 10,000 COLONIES/mL ESCHERICHIA COLI         Radiology Studies: DG CHEST PORT 1 VIEW  Result Date: 08/10/2022 CLINICAL DATA:  Cough. EXAM: PORTABLE CHEST 1 VIEW COMPARISON:  August 06, 2022. FINDINGS: Stable cardiomediastinal silhouette. Feeding tube is seen entering stomach. Bilateral pulmonary nodules are noted concerning for metastatic disease. Mild left basilar subsegmental atelectasis is noted with small left pleural effusion. Bony thorax is  unremarkable. IMPRESSION: Bilateral pulmonary nodules are again noted concerning for metastatic disease. Mild left basilar subsegmental atelectasis is noted with small left pleural effusion. Electronically Signed   By: Marijo Conception M.D.   On: 08/10/2022 12:38        Scheduled Meds:  acetaminophen  650 mg Per Tube Q6H   Or   acetaminophen  650 mg Rectal Q6H   amantadine  200 mg Per Tube BID   docusate  100 mg Per Tube QHS   ezetimibe  10 mg Per Tube Daily   feeding supplement (PROSource TF20)  60 mL Per Tube Daily   methylPREDNISolone (SOLU-MEDROL) injection  40 mg Intravenous Daily   metoprolol tartrate  25 mg Per Tube BID   sennosides  5 mL Per Tube QHS   sodium chloride flush  3 mL Intravenous Q12H   Continuous Infusions:  sodium chloride Stopped (08/11/22 0008)   feeding supplement (JEVITY 1.5 CAL/FIBER) 1,000 mL (08/10/22 0200)   heparin 1,250 Units/hr (08/11/22 1453)     LOS: 8 days    Time spent: over 30 min    Fayrene Helper, MD [facility]   To contact the attending provider between 7A-7P or the covering provider during after hours 7P-7A, please log into the web site [email or web address] and access using universal [place] password for that web site. If you do not have the password, please call the hospital operator.  08/11/2022, 5:17 PM

## 2022-08-11 NOTE — Progress Notes (Signed)
PT Cancellation Note  Patient Details Name: Sarah Walsh MRN: [id number] DOB: [birth year]   Cancelled Treatment:    Reason Eval/Treat Not Completed: Medical issues which prohibited therapy. Pt with respiratory issues overnight. Will continue to monitor and see what palliative meeting results are.    Shary Decamp [facility] 08/11/2022, 12:37 PM [place] Office [phone]

## 2022-08-11 NOTE — Progress Notes (Signed)
   08/11/22 0509  BiPAP/CPAP/SIPAP  $ Non-Invasive Ventilator  Non-Invasive Vent Subsequent  BiPAP/CPAP/SIPAP Pt Type Adult  Mask Type Full face mask  Set Rate 10 breaths/min  Respiratory Rate 18 breaths/min  IPAP 14 cmH20  EPAP 6 cmH2O  Oxygen Percent 40 %  Minute Ventilation 8.4  Leak 7  Peak Inspiratory Pressure (PIP) 14  Tidal Volume (Vt) 447  BiPAP/CPAP/SIPAP BiPAP  Patient Home Equipment No  Auto Titrate No  Press High Alarm 25 cmH2O  Press Low Alarm 5 cmH2O  BiPAP/CPAP /SiPAP Vitals  Pulse Rate 96  Resp 19  SpO2 98 %  MEWS Score/Color  MEWS Score 0  MEWS Score Color Green   Pt. Appears to be a little more awake pt. Said Hi.. pt. Is still on bipap with above settings.

## 2022-08-11 NOTE — Progress Notes (Signed)
ANTICOAGULATION CONSULT NOTE - Follow Up Consult  Pharmacy Consult for heparin Indication:  multiple embolic strokes with paradoxical embolism and DVT  Labs: Recent Labs    08/09/22 0458 08/10/22 0525 08/11/22 0149  HGB 12.6 11.9* 11.5*  HCT 38.6 35.6* 34.1*  PLT 221 241 226  HEPARINUNFRC 0.50 0.64 0.68  CREATININE 0.63 0.62 0.62     Assessment: 66yo female subtherapeutic on heparin with initial cautious dosing for paradoxical embolism and DVT in setting of multiple embolic strokes. Goals of care discussions are ongoing.   Heparin level 0.68 and above goal. Hg stable  Goal of Therapy:  Heparin level 0.3-0.5 units/ml per neurology   Plan:  Decrease heparin to 1250 units/hr Monitor CBC daily with heparin level Will follow plans for oral anticoagulation  Hildred Laser, PharmD Clinical Pharmacist **Pharmacist phone directory can now be found on [email or web address] ([email or web address]).  Listed under [place].

## 2022-08-12 ENCOUNTER — Inpatient Hospital Stay (HOSPITAL_COMMUNITY): Payer: Medicare HMO

## 2022-08-12 DIAGNOSIS — I639 Cerebral infarction, unspecified: Secondary | ICD-10-CM | POA: Diagnosis not present

## 2022-08-12 DIAGNOSIS — Z515 Encounter for palliative care: Secondary | ICD-10-CM | POA: Diagnosis not present

## 2022-08-12 DIAGNOSIS — I82452 Acute embolism and thrombosis of left peroneal vein: Secondary | ICD-10-CM | POA: Diagnosis not present

## 2022-08-12 DIAGNOSIS — Q2112 Patent foramen ovale: Secondary | ICD-10-CM | POA: Diagnosis not present

## 2022-08-12 DIAGNOSIS — Z7189 Other specified counseling: Secondary | ICD-10-CM

## 2022-08-12 DIAGNOSIS — A4102 Sepsis due to Methicillin resistant Staphylococcus aureus: Secondary | ICD-10-CM | POA: Diagnosis not present

## 2022-08-12 DIAGNOSIS — C8448 Peripheral T-cell lymphoma, not classified, lymph nodes of multiple sites: Secondary | ICD-10-CM | POA: Diagnosis not present

## 2022-08-12 DIAGNOSIS — R4182 Altered mental status, unspecified: Secondary | ICD-10-CM | POA: Diagnosis not present

## 2022-08-12 LAB — COMPREHENSIVE METABOLIC PANEL
ALT: 12 U/L (ref 0–44)
AST: 17 U/L (ref 15–41)
Albumin: 2.2 g/dL — ABNORMAL LOW (ref 3.5–5.0)
Alkaline Phosphatase: 66 U/L (ref 38–126)
Anion gap: 9 (ref 5–15)
BUN: 18 mg/dL (ref 8–23)
CO2: 29 mmol/L (ref 22–32)
Calcium: 8.4 mg/dL — ABNORMAL LOW (ref 8.9–10.3)
Chloride: 100 mmol/L (ref 98–111)
Creatinine, Ser: 0.68 mg/dL (ref 0.44–1.00)
GFR, Estimated: >60 mL/min (ref >60)
Glucose, Bld: 171 mg/dL — ABNORMAL HIGH (ref 70–99)
Potassium: 4.2 mmol/L (ref 3.5–5.1)
Sodium: 138 mmol/L (ref 135–145)
Total Bilirubin: 0.6 mg/dL (ref 0.3–1.2)
Total Protein: 5.8 g/dL — ABNORMAL LOW (ref 6.5–8.1)

## 2022-08-12 LAB — CBC WITH DIFFERENTIAL/PLATELET
Abs Immature Granulocytes: 0 10*3/uL (ref 0.00–0.07)
Basophils Absolute: 0 10*3/uL (ref 0.0–0.1)
Basophils Relative: 0 %
Eosinophils Absolute: 24.8 10*3/uL — ABNORMAL HIGH (ref 0.0–0.5)
Eosinophils Relative: 53 %
HCT: 36.4 % (ref 36.0–46.0)
Hemoglobin: 12.1 g/dL (ref 12.0–15.0)
Lymphocytes Relative: 4 %
Lymphs Abs: 1.9 10*3/uL (ref 0.7–4.0)
MCH: 31.9 pg (ref 26.0–34.0)
MCHC: 33.2 g/dL (ref 30.0–36.0)
MCV: 96 fL (ref 80.0–100.0)
Monocytes Absolute: 0.9 10*3/uL (ref 0.1–1.0)
Monocytes Relative: 2 %
Neutro Abs: 19.1 10*3/uL — ABNORMAL HIGH (ref 1.7–7.7)
Neutrophils Relative %: 41 %
Platelets: 308 10*3/uL (ref 150–400)
RBC: 3.79 MIL/uL — ABNORMAL LOW (ref 3.87–5.11)
RDW: 17.1 % — ABNORMAL HIGH (ref 11.5–15.5)
WBC: 46.7 10*3/uL — ABNORMAL HIGH (ref 4.0–10.5)
nRBC: 0.1 % (ref 0.0–0.2)

## 2022-08-12 LAB — BLOOD GAS, VENOUS
Acid-Base Excess: 8.5 mmol/L — ABNORMAL HIGH (ref 0.0–2.0)
Bicarbonate: 31.8 mmol/L — ABNORMAL HIGH (ref 20.0–28.0)
Drawn by: 7331
O2 Saturation: 86.7 %
Patient temperature: 36.8
pCO2, Ven: 38 mmHg — ABNORMAL LOW (ref 44–60)
pH, Ven: 7.53 — ABNORMAL HIGH (ref 7.25–7.43)
pO2, Ven: 52 mmHg — ABNORMAL HIGH (ref 32–45)

## 2022-08-12 LAB — GLUCOSE, CAPILLARY
Glucose-Capillary: 148 mg/dL — ABNORMAL HIGH (ref 70–99)
Glucose-Capillary: 177 mg/dL — ABNORMAL HIGH (ref 70–99)
Glucose-Capillary: 194 mg/dL — ABNORMAL HIGH (ref 70–99)
Glucose-Capillary: 226 mg/dL — ABNORMAL HIGH (ref 70–99)
Glucose-Capillary: 233 mg/dL — ABNORMAL HIGH (ref 70–99)
Glucose-Capillary: 94 mg/dL (ref 70–99)

## 2022-08-12 LAB — POTASSIUM: Potassium: 4.3 mmol/L (ref 3.5–5.1)

## 2022-08-12 LAB — BRAIN NATRIURETIC PEPTIDE: B Natriuretic Peptide: 849.7 pg/mL — ABNORMAL HIGH (ref 0.0–100.0)

## 2022-08-12 LAB — MAGNESIUM
Magnesium: 2.4 mg/dL (ref 1.7–2.4)
Magnesium: 2.2 mg/dL (ref 1.7–2.4)

## 2022-08-12 LAB — HEPARIN LEVEL (UNFRACTIONATED)
Heparin Unfractionated: 0.67 IU/mL (ref 0.30–0.70)
Heparin Unfractionated: 0.38 IU/mL (ref 0.30–0.70)

## 2022-08-12 LAB — PHOSPHORUS: Phosphorus: 2.8 mg/dL (ref 2.5–4.6)

## 2022-08-12 MED ORDER — JEVITY 1.5 CAL/FIBER PO LIQD
1000.0000 mL | ORAL | Status: DC
  Administered 2022-08-13 – 2022-08-15 (×3): 1000 mL
  Filled 2022-08-12 (×3): qty 1000

## 2022-08-12 MED ORDER — INSULIN ASPART 100 UNIT/ML IJ SOLN
0.0000 [IU] | INTRAMUSCULAR | Status: DC
  Administered 2022-08-12: 3 [IU] via SUBCUTANEOUS
  Administered 2022-08-13: 1 [IU] via SUBCUTANEOUS
  Administered 2022-08-13 (×3): 3 [IU] via SUBCUTANEOUS
  Administered 2022-08-13 – 2022-08-14 (×2): 2 [IU] via SUBCUTANEOUS
  Administered 2022-08-14: 3 [IU] via SUBCUTANEOUS
  Administered 2022-08-14 (×3): 2 [IU] via SUBCUTANEOUS
  Administered 2022-08-15 (×2): 1 [IU] via SUBCUTANEOUS
  Administered 2022-08-15: 2 [IU] via SUBCUTANEOUS

## 2022-08-12 MED ORDER — PROSOURCE TF20 ENFIT COMPATIBL EN LIQD
60.0000 mL | Freq: Three times a day (TID) | ENTERAL | Status: DC
  Administered 2022-08-12 – 2022-08-14 (×7): 60 mL
  Filled 2022-08-12 (×7): qty 60

## 2022-08-12 NOTE — Progress Notes (Signed)
EEG complete - results pending 

## 2022-08-12 NOTE — Progress Notes (Addendum)
CSW acknowledges PT/OT recommendations. Palliative following. CSW following to speak with patients daughter when appropriate. CSW will continue to follow and assist with patients dc planning needs.

## 2022-08-12 NOTE — Progress Notes (Signed)
Physical Therapy Treatment Patient Details Name: Sarah Walsh MRN: [id number] DOB: 01-22-1956 Today's Date: 08/12/2022   History of Present Illness 66 y.o. female presents to [facility] hospital on 08/02/2022 with lethargy, confusion, anorexia, gait instability. Pt recently diagnosed with T cell lymphoma at [facility]. MRI demonstrates numerous diffuse bilateral foci of abnormal diffusion restriction, suggestive of embolic shower. Also with new L lower extremity DVT. Repeat MRI due to worsening level of consciousness showed extension of embolic shower strokes, involving bil cerebral and cerebellar hemispheres. PMH includes HTN, HLP, DMII, T cell lymphoma (recent diagnosis)    PT Comments    Pt making slow progress with mobility and balance. Sitting balance improved today although pt remains unable to hold head upright with severe forward neck flexion. No active movement of UE's noted with extensor tone noted. Pt moving LE's well and was able to come to stand briefly with +2 max assist but overall function remains poor. Updated dc recommendation to SNF. Feel pt will need extended time to maximize her function and likely will require significant assist for all mobility.    Recommendations for follow up therapy are one component of a multi-disciplinary discharge planning process, led by the attending physician.  Recommendations may be updated based on patient status, additional functional criteria and insurance authorization.  Follow Up Recommendations  Skilled nursing-short term rehab (<3 hours/day) Can patient physically be transported by private vehicle: No   Assistance Recommended at Discharge Frequent or constant Supervision/Assistance  Patient can return home with the following Two people to help with walking and/or transfers;Two people to help with bathing/dressing/bathroom;Assistance with cooking/housework;Assistance with feeding;Direct supervision/assist for medications management;Direct  supervision/assist for financial management;Assist for transportation;Help with stairs or ramp for entrance   Equipment Recommendations  Wheelchair (measurements PT);Wheelchair cushion (measurements PT);Other (comment);Hospital bed (hoyer lift)    Recommendations for Other Services       Precautions / Restrictions Precautions Precautions: Fall Precaution Comments: cortrak Restrictions Weight Bearing Restrictions: No     Mobility  Bed Mobility Overal bed mobility: Needs Assistance Bed Mobility: Supine to Sit, Sit to Supine Rolling: Total assist, +2 for physical assistance, +2 for safety/equipment   Supine to sit: Total assist, +2 for physical assistance, +2 for safety/equipment Sit to supine: Total assist, +2 for physical assistance, +2 for safety/equipment   General bed mobility comments: Assist for all aspects    Transfers Overall transfer level: Needs assistance Equipment used: 2 person hand held assist Transfers: Sit to/from Stand Sit to Stand: +2 physical assistance, Max assist           General transfer comment: Used bed pad under hips and support of trunk to come to standing. Pt assisting with LE's.    Ambulation/Gait                   Stairs             Wheelchair Mobility    Modified Rankin (Stroke Patients Only) Modified Rankin (Stroke Patients Only) Pre-Morbid Rankin Score: No symptoms Modified Rankin: Severe disability     Balance Overall balance assessment: Needs assistance Sitting-balance support: Feet supported, Bilateral upper extremity supported Sitting balance-Leahy Scale: Poor Sitting balance - Comments: Initially pt mod assist. Performed anterior/posterior rhythmic rocking and pt improved to sitting with min guard with occasional min assist to correct. Pt with severe neck flexion throughout and required manual max assist to raise head and hold it. Performed side to side leans onto forearm with max assist. Postural control:  Posterior  lean, Left lateral lean   Standing balance-Leahy Scale: Zero Standing balance comment: +2 max for brief standing                            Cognition Arousal/Alertness: Awake/alert Behavior During Therapy: Flat affect Overall Cognitive Status: Difficult to assess                                 General Comments: Pt following 1 step commands inconsistently with LE's. Pt able to state her name but did not answer any other questions. Lt head turn and gaze preference        Exercises      General Comments General comments (skin integrity, edema, etc.): SpO2 88-94%      Pertinent Vitals/Pain Pain Assessment Pain Assessment: Faces Faces Pain Scale: Hurts a little bit Pain Location: grimace with initial mobility, appeared generalized Pain Descriptors / Indicators: Grimacing Pain Intervention(s): Limited activity within patient's tolerance, Monitored during session, Repositioned    Home Living                          Prior Function            PT Goals (current goals can now be found in the care plan section) Acute Rehab PT Goals Patient Stated Goal: not stated Progress towards PT goals: Progressing toward goals    Frequency    Min 3X/week      PT Plan Discharge plan needs to be updated;Frequency needs to be updated    Co-evaluation              AM-PAC PT "6 Clicks" Mobility   Outcome Measure  Help needed turning from your back to your side while in a flat bed without using bedrails?: Total Help needed moving from lying on your back to sitting on the side of a flat bed without using bedrails?: Total Help needed moving to and from a bed to a chair (including a wheelchair)?: Total Help needed standing up from a chair using your arms (e.g., wheelchair or bedside chair)?: Total Help needed to walk in hospital room?: Total Help needed climbing 3-5 steps with a railing? : Total 6 Click Score: 6    End of Session  Equipment Utilized During Treatment: Oxygen Activity Tolerance: Patient tolerated treatment well Patient left: in bed;with call bell/phone within reach;with bed alarm set Nurse Communication: Mobility status;Need for lift equipment PT Visit Diagnosis: Muscle weakness (generalized) (M62.81);Difficulty in walking, not elsewhere classified (R26.2);Other symptoms and signs involving the nervous system (D35.701)     Time: 7793-9030 PT Time Calculation (min) (ACUTE ONLY): 29 min  Charges:  $Therapeutic Activity: 23-37 mins                     [place] 08/12/2022, 1:48 PM

## 2022-08-12 NOTE — Progress Notes (Signed)
Palliative Medicine Inpatient Follow Up Note HPI: 66 y.o. female  with past medical history of HTN, HLD, T2DM, anemia, arthritis, remote history of melanoma, and recent diagnosis of T-cell lymphoma admitted on 08/02/2022 with altered mental status.  MRI brain was positive for scattered cortical and subcortical infarcts.  Patient now with acute hypoxic respiratory failure requiring BiPAP.  Respiratory status improved with IV Lasix.  Patient with ongoing acute metabolic encephalopathy.  She occasionally verbalizes a few words.  She is unable to move her upper extremities.  Patient requiring core track for artificial nutrition.  Chest x-ray 10/18 with bilateral pulmonary nodules concerning for metastatic disease.  PMT consulted to discuss goals of care.  Today's Discussion 08/12/2022  *Please note that this is a verbal dictation therefore any spelling or grammatical errors are due to the "[place] One" system interpretation.  Chart reviewed inclusive of vital signs, progress notes, laboratory results, and diagnostic images.   Erma Heritage and I met at bedside with Miracle and her daughter, [name]. Discussed patients present illness in the setting of her stroke and hypoxic respiratory failure. We reviewed that Lyndon has been through quite a bit during this prolonged hospital stay. Discussed that prior to our arrival patient was more vocal and then had an episode of BLE shaking after which she was less vocal. We discussed that we could inform the neurology team. In addition patients daughter notes she was more responsive after a night on BiPAP. She had not been on this last night therefore another consideration is whether or not she is retaining CO2. We shared that we'd inform Dr. Florene Glen as well.   Created space and opportunity for patients daughter to explore thoughts feelings and fears regarding current medical situation.Patients daughter expresses that she and her brother would like to allow time for  outcomes, she would like to continue with BiPap if needed. They do wish to see if Mikeya can make improvement though recognize this may not occur in the way they hope for. [name] vocalizes that if things do not improve and/or worsen the choice would be to transition the focus to comfort.   Questions and concerns addressed/Palliative Support Provided.   Objective Assessment: Vital Signs Vitals:   08/12/22 0038 08/12/22 0520  BP: 119/84 137/87  Pulse: 81   Resp: 16 19  Temp: 97.9 F (36.6 C) 98.1 F (36.7 C)  SpO2: 95% 96%    Intake/Output Summary (Last 24 hours) at 08/12/2022 1127 Last data filed at 08/12/2022 0500 Gross per 24 hour  Intake 250 ml  Output 850 ml  Net -600 ml   Last Weight  Most recent update: 08/12/2022  5:19 AM    Weight  79.1 kg (174 lb 6.1 oz)            Gen: Elderly Caucasian F in NAD HEENT: moist mucous membranes CV: Regular rate and rhythm  PULM:  On 4LPM [place], deep abdominal breathing present ABD: soft/nontender  EXT: No edema  Neuro: Somnolent, minimally responsive  SUMMARY OF RECOMMENDATIONS   DNAR/DNI - Bipap is okay  Dr. Erlinda Hong made aware of BLE movements and somnolent state thereafter --> EEG  Dr. Florene Glen made aware of patients deeper breaths --> VBG  Allow time for outcomes  Ongoing Palliative Support  Billing based on MDM: High  Problems Addressed: One acute or chronic illness or injury that poses a threat to life or bodily function  Amount and/or Complexity of Data: Category 3:Discussion of management or test interpretation with external physician/other  qualified health care professional/appropriate source (not separately reported)  Risks: Decision regarding hospitalization or escalation of hospital care and Decision not to resuscitate or to de-escalate care because of poor prognosis ______________________________________________________________________________________ [facility] Team Team Cell  Phone: [phone] Please utilize secure chat with additional questions, if there is no response within 30 minutes please call the above phone number  Palliative Medicine Team providers are available by phone from 7am to 7pm daily and can be reached through the team cell phone.  Should this patient require assistance outside of these hours, please call the patient's attending physician.

## 2022-08-12 NOTE — Procedures (Signed)
[name]  MRN: [id number]  Epilepsy Attending: Lora Havens  Referring Physician/Provider: Elodia Florence., MD Date: 08/12/2022 Duration: 27.03 mins  Patient history: 66yo F with ams. EEG to evaluate for seizure  Level of alertness: Awake  AEDs during EEG study: None  Technical aspects: This EEG study was done with scalp electrodes positioned according to the 10-20 International system of electrode placement. Electrical activity was reviewed with band pass filter of 1-'70Hz'$ , sensitivity of 7 uV/mm, display speed of 46m/sec with a '60Hz'$  notched filter applied as appropriate. EEG data were recorded continuously and digitally stored.  Video monitoring was available and reviewed as appropriate.  Description: EEG showed continuous generalized 3 to 6 Hz theta-delta slowing. Hyperventilation and photic stimulation were not performed.     Of note, study was technically difficult due to significant chewing artifact.  ABNORMALITY - Continuous slow, generalized  IMPRESSION: This technically difficult study is suggestive of moderate diffuse encephalopathy, nonspecific etiology. No seizures or epileptiform discharges were seen throughout the recording.  Marlene Pfluger OBarbra Sarks

## 2022-08-12 NOTE — Progress Notes (Signed)
STROKE TEAM PROGRESS NOTE   SUBJECTIVE (INTERVAL HISTORY) Daughter is at the bedside.  Per daughter, patient this morning had bilateral lower extremity shaking movement lasting 5 seconds followed by drowsy sleepy worsening mental status.  EEG no seizure.  Daughter reported that yesterday after overnight BiPAP, patient doing well, able to say " I love you to" " I guess" " I cannot wait".  However last night she did not receive BiPAP, and that today patient was lethargic, drowsy and only speak single words.  Per daughter, patient at home does have snoring and intermittent apnea during sleep but never formally diagnosed with OSA.   OBJECTIVE Temp:  [97.9 F (36.6 C)-98.4 F (36.9 C)] 98.4 F (36.9 C) (10/20 1610) Pulse Rate:  [78-102] 78 (10/20 1610) Cardiac Rhythm: Normal sinus rhythm;Supraventricular tachycardia (10/20 1030) Resp:  [16-20] 19 (10/20 1610) BP: (119-137)/(78-87) 129/78 (10/20 1610) SpO2:  [92 %-96 %] 94 % (10/20 1610) Weight:  [79.1 kg] 79.1 kg (10/20 0519)  Recent Labs  Lab 08/11/22 1652 08/11/22 2012 08/12/22 0506 08/12/22 1230 08/12/22 1612  GLUCAP 194* 223* 94 148* 233*   Recent Labs  Lab 08/08/22 0152 08/08/22 1701 08/09/22 0458 08/09/22 1606 08/10/22 0525 08/11/22 0149 08/12/22 0200 08/12/22 1640  NA 139  --  138  --  137 135 138  --   K 3.3*  --  3.6  --  3.5 4.4 4.2 4.3  CL 103  --  103  --  104 99 100  --   CO2 23  --  24  --  '26 27 29  '$ --   GLUCOSE 124*  --  173*  --  206* 192* 171*  --   BUN 14  --  18  --  '16 16 18  '$ --   CREATININE 0.64  --  0.63  --  0.62 0.62 0.68  --   CALCIUM 8.0*  --  8.1*  --  8.0* 8.0* 8.4*  --   MG  --    < > 2.1 2.1 2.1 2.0 2.4 2.2  PHOS  --    < > 1.9* 2.0* 1.9* 2.1* 2.8  --    < > = values in this interval not displayed.   Recent Labs  Lab 08/09/22 0458 08/11/22 0149 08/12/22 0200  AST '18 16 17  '$ ALT '11 11 12  '$ ALKPHOS 76 66 66  BILITOT 0.1* 0.4 0.6  PROT 5.7* 5.5* 5.8*  ALBUMIN 2.1* 2.0* 2.2*   Recent  Labs  Lab 08/08/22 0152 08/09/22 0458 08/10/22 0525 08/11/22 0149 08/12/22 0200  WBC 64.6* 65.0* 68.3* 44.1* 46.7*  NEUTROABS 7.7 7.8* 9.6* 18.5* 19.1*  HGB 12.1 12.6 11.9* 11.5* 12.1  HCT 35.0* 38.6 35.6* 34.1* 36.4  MCV 95.1 98.5 97.5 95.8 96.0  PLT 208 221 241 226 308   No results for input(s): "CKTOTAL", "CKMB", "CKMBINDEX", "TROPONINI" in the last 168 hours. No results for input(s): "LABPROT", "INR" in the last 72 hours. No results for input(s): "COLORURINE", "LABSPEC", "PHURINE", "GLUCOSEU", "HGBUR", "BILIRUBINUR", "KETONESUR", "PROTEINUR", "UROBILINOGEN", "NITRITE", "LEUKOCYTESUR" in the last 72 hours.  Invalid input(s): "APPERANCEUR"      Component Value Date/Time   CHOL 127 08/03/2022 0905   TRIG 97 08/03/2022 0905   HDL 17 (L) 08/03/2022 0905   CHOLHDL 7.5 08/03/2022 0905   VLDL 19 08/03/2022 0905   LDLCALC 91 08/03/2022 0905   Lab Results  Component Value Date   HGBA1C 6.0 (H) 08/03/2022   No results found for: "LABOPIA", "COCAINSCRNUR", "LABBENZ", "  AMPHETMU", "THCU", "LABBARB"  No results for input(s): "ETH" in the last 168 hours.  I have personally reviewed the radiological images below and agree with the radiology interpretations.  EEG adult  Result Date: 08/12/2022 Lora Havens, MD     08/12/2022  3:36 PM [name] MRN: [id number] Epilepsy Attending: Lora Havens Referring Physician/Provider: Elodia Florence., MD Date: 08/12/2022 Duration: 27.03 mins Patient history: 66yo F with ams. EEG to evaluate for seizure Level of alertness: Awake AEDs during EEG study: None Technical aspects: This EEG study was done with scalp electrodes positioned according to the 10-20 International system of electrode placement. Electrical activity was reviewed with band pass filter of 1-'70Hz'$ , sensitivity of 7 uV/mm, display speed of 41m/sec with a '60Hz'$  notched filter applied as appropriate. EEG data were recorded continuously and digitally stored.   Video monitoring was available and reviewed as appropriate. Description: EEG showed continuous generalized 3 to 6 Hz theta-delta slowing. Hyperventilation and photic stimulation were not performed.   Of note, study was technically difficult due to significant chewing artifact. ABNORMALITY - Continuous slow, generalized IMPRESSION: This technically difficult study is suggestive of moderate diffuse encephalopathy, nonspecific etiology. No seizures or epileptiform discharges were seen throughout the recording. PLora Havens  DG CHEST PORT 1 VIEW  Result Date: 08/10/2022 CLINICAL DATA:  Cough. EXAM: PORTABLE CHEST 1 VIEW COMPARISON:  August 06, 2022. FINDINGS: Stable cardiomediastinal silhouette. Feeding tube is seen entering stomach. Bilateral pulmonary nodules are noted concerning for metastatic disease. Mild left basilar subsegmental atelectasis is noted with small left pleural effusion. Bony thorax is unremarkable. IMPRESSION: Bilateral pulmonary nodules are again noted concerning for metastatic disease. Mild left basilar subsegmental atelectasis is noted with small left pleural effusion. Electronically Signed   By: JMarijo ConceptionM.D.   On: 08/10/2022 12:38   DG Abd Portable 1V  Result Date: 08/08/2022 CLINICAL DATA:  Feeding tube placement EXAM: PORTABLE ABDOMEN - 1 VIEW COMPARISON:  CT abdomen 05/29/2006 FINDINGS: Partially visualized bilateral pulmonary nodules. Bilateral hilar lymphadenopathy. Feeding tube with the tip projecting over the antrum of the stomach. No bowel dilatation to suggest obstruction. No evidence of pneumoperitoneum, portal venous gas or pneumatosis. No pathologic calcifications along the expected course of the ureters. No acute osseous abnormality. IMPRESSION: 1. Feeding tube with the tip projecting over the antrum of the stomach. 2. Redemonstrated bilateral hilar lymphadenopathy and bilateral pulmonary nodules. Electronically Signed   By: HKathreen DevoidM.D.   On: 08/08/2022  13:00   EEG adult  Result Date: 08/07/2022 SDerek Jack MD     08/07/2022 12:34 PM Patient Name: SDOCIE ABRAMOVICHMRN: [id number] Attending: CSu MonksMD Referring Physician/Provider: KDonnetta Simpers MD Date: 08/07/2022 Duration: 24.09 min  Patient history: 66year old female with alteration in mental status. EEG to evaluate for seizure  Level of alertness:  lethargic  AEDs during EEG study: None  Technical aspects: This EEG study was done with scalp electrodes positioned according to the 10-20 International system of electrode placement. Electrical activity was reviewed with band pass filter of 1-'70Hz'$ , sensitivity of 7 uV/mm, display speed of 330msec with a '60Hz'$  notched filter applied as appropriate. EEG data were recorded continuously and digitally stored.  Video monitoring was available and reviewed as appropriate.  Description: EEG showed continuous generalized 3 to 6 Hz theta-delta slowing. Generalized periodic discharges with triphasic morphology at  1.5-'2Hz'$  were also noted, more prominent when awake/stimulated. Hyperventilation and photic stimulation were not performed.  ABNORMALITY - Periodic discharges with triphasic morphology, generalized ( GPDs) - Continuous slow, generalized  IMPRESSION: This study showed generalized periodic discharges with triphasic morphology which can be on the ictal-interictal continuum. However, the morphology, frequency and reactivity to stimulation is more commonly indicative of toxic-metabolic causes. Additionally there is moderate to severe diffuse encephalopathy, nonspecific etiology. No seizures were seen throughout the recording. This EEG is unchanged compared to yesterday. Su Monks, MD [facility] [phone] If 7pm- 7am, please page neurology on call as listed in [place].   DG CHEST PORT 1 VIEW  Result Date: 08/06/2022 CLINICAL DATA:  Acute respiratory failure with hypoxia EXAM: PORTABLE CHEST 1 VIEW COMPARISON:  Chest x-ray  08/02/2022.  Chest CT 08/03/2022. FINDINGS: Bilateral pulmonary nodular densities are again noted and similar to the prior study. There is increased central pulmonary vascular congestion. The cardiomediastinal silhouette appears stable. There is no pneumothorax or acute fracture. IMPRESSION: 1. Increased central pulmonary vascular congestion. 2. Stable bilateral pulmonary nodules. Electronically Signed   By: Ronney Asters M.D.   On: 08/06/2022 17:33   EEG adult  Result Date: 08/06/2022 Lora Havens, MD     08/06/2022  8:43 AM [name] MRN: [id number] Epilepsy Attending: Lora Havens Referring Physician/Provider: Donnetta Simpers, MD Date: 08/06/2022 Duration: 23.12 mins Patient history: 66 year old female with alteration in mental status. EEG to evaluate for seizure Level of alertness:  lethargic AEDs during EEG study: None Technical aspects: This EEG study was done with scalp electrodes positioned according to the 10-20 International system of electrode placement. Electrical activity was reviewed with band pass filter of 1-'70Hz'$ , sensitivity of 7 uV/mm, display speed of 41m/sec with a '60Hz'$  notched filter applied as appropriate. EEG data were recorded continuously and digitally stored.  Video monitoring was available and reviewed as appropriate. Description: EEG showed continuous generalized 3 to 6 Hz theta-delta slowing. Generalized periodic discharges with triphasic morphology at  1.5-'2Hz'$  were also noted, more prominent when awake/stimulated. Hyperventilation and photic stimulation were not performed.   ABNORMALITY - Periodic discharges with triphasic morphology, generalized ( GPDs) - Continuous slow, generalized IMPRESSION: This study showed generalized periodic discharges with triphasic morphology which can be on the ictal-interictal continuum. However, the morphology, frequency and reactivity to stimulation is more commonly indicative of toxic-metabolic causes. Additionally  there is moderate to severe diffuse encephalopathy, nonspecific etiology. No seizures were seen throughout the recording. PLora Havens  MR BRAIN WO CONTRAST  Result Date: 08/05/2022 CLINICAL DATA:  History of stroke, mental status change EXAM: MRI HEAD WITHOUT CONTRAST TECHNIQUE: Multiplanar, multiecho pulse sequences of the brain and surrounding structures were obtained without intravenous contrast. COMPARISON:  CT head 1 day prior, brain MRI 08/02/2022 FINDINGS: Brain: Again seen are extensive acute infarcts throughout both cerebral and cerebellar hemispheres across bilateral ACA, MCA, and PCA distributions, overall increased in extent compared to the MRI from 3 days prior. There is associated FLAIR signal abnormality and a few punctate foci of SWI signal dropout suspicious for petechial hemorrhage but no organized hematoma or mass effect. The foci of intrinsic T1 hyperintensity in the occipital lobes may be due to petechial hemorrhage. There is no acute intracranial hemorrhage or extra-axial fluid collection Background parenchymal volume is normal. The ventricles are stable in size. There is no mass lesion. There is no mass effect or midline shift. Vascular: Normal flow voids. Skull and upper cervical spine: Normal marrow signal. Sinuses/Orbits: The paranasal sinuses are clear. The globes and orbits are unremarkable. Other: None. IMPRESSION:  Extensive acute infarcts throughout both cerebral and cerebellar hemispheres across multiple vascular distributions again favored embolic with differential including watershed ischemia, increased in extent since the MRI from 3 days prior. Suspect some petechial hemorrhage but no hematoma or mass effect. Electronically Signed   By: Valetta Mole M.D.   On: 08/05/2022 19:45   ECHO TEE  Result Date: 08/05/2022    TRANSESOPHOGEAL ECHO REPORT   Patient Name:   Sarah Walsh Date of Exam: 08/05/2022 Medical Rec #:  [id number]     Height:       63.0 in Accession #:     [id number]    Weight:       160.0 lb Date of Birth:  [birth year]     BSA:          1.759 m Patient Age:    31 years      BP:           125/78 mmHg Patient Gender: F             HR:           99 bpm. Exam Location:  Inpatient Procedure: Transesophageal Echo, 3D Echo, Color Doppler, Cardiac Doppler and            Saline Contrast Bubble Study Indications:     Stroke i63.9  History:         Patient has prior history of Echocardiogram examinations, most                  recent 08/03/2022. Risk Factors:Hypertension, Diabetes and                  Dyslipidemia.  Sonographer:     Raquel Sarna Senior RDCS Referring Phys:  [id number] Margie Billet Diagnosing Phys: Oswaldo Milian MD PROCEDURE: After discussion of the risks and benefits of a TEE, an informed consent was obtained from the patient. The transesophogeal probe was passed without difficulty through the esophogus of the patient. Sedation performed by different physician. The patient was monitored while under deep sedation. Anesthestetic sedation was provided intravenously by Anesthesiology: '70mg'$  of Propofol. The patient developed no complications during the procedure. IMPRESSIONS  1. Left ventricular ejection fraction, by estimation, is 55 to 60%. The left ventricle has normal function.  2. Right ventricular systolic function is normal. The right ventricular size is normal. There is moderately elevated pulmonary artery systolic pressure.  3. No left atrial/left atrial appendage thrombus was detected.  4. The mitral valve is normal in structure. Mild mitral valve regurgitation.  5. The aortic valve is tricuspid. Aortic valve regurgitation is not visualized.  6. Evidence of atrial level shunting detected by color flow Doppler. Agitated saline contrast bubble study was positive with shunting observed within 3-6 cardiac cycles suggestive of interatrial shunt. Few bubbles seen in left atrium after patient coughed, suggesting small PFO. Small PFO confirmed visually with left  to right shunting.  7. No vegetation seen FINDINGS  Left Ventricle: Left ventricular ejection fraction, by estimation, is 55 to 60%. The left ventricle has normal function. The left ventricular internal cavity size was normal in size. Right Ventricle: The right ventricular size is normal. No increase in right ventricular wall thickness. Right ventricular systolic function is normal. There is moderately elevated pulmonary artery systolic pressure. The tricuspid regurgitant velocity is 3.35 m/s, and with an assumed right atrial pressure of 3 mmHg, the estimated right ventricular systolic pressure is 35.5 mmHg. Left Atrium: Left atrial size was normal in size.  No left atrial/left atrial appendage thrombus was detected. Right Atrium: Right atrial size was normal in size. Pericardium: Trivial pericardial effusion is present. Mitral Valve: The mitral valve is normal in structure. Mild mitral valve regurgitation. Tricuspid Valve: The tricuspid valve is normal in structure. Tricuspid valve regurgitation is mild. Aortic Valve: The aortic valve is tricuspid. Aortic valve regurgitation is not visualized. Aortic valve mean gradient measures 6.0 mmHg. Aortic valve peak gradient measures 10.2 mmHg. Pulmonic Valve: The pulmonic valve was grossly normal. Pulmonic valve regurgitation is not visualized. Aorta: The aortic root is normal in size and structure. IAS/Shunts: Evidence of atrial level shunting detected by color flow Doppler. Agitated saline contrast was given intravenously to evaluate for intracardiac shunting. Agitated saline contrast bubble study was positive with shunting observed within 3-6 cardiac cycles suggestive of interatrial shunt.  AORTIC VALVE AV Vmax:           160.00 cm/s AV Vmean:          112.000 cm/s AV VTI:            0.244 m AV Peak Grad:      10.2 mmHg AV Mean Grad:      6.0 mmHg LVOT Vmax:         115.00 cm/s LVOT Vmean:        83.500 cm/s LVOT VTI:          0.210 m LVOT/AV VTI ratio: 0.86 TRICUSPID  VALVE TR Peak grad:   44.9 mmHg TR Vmax:        335.00 cm/s  SHUNTS Systemic VTI: 0.21 m Oswaldo Milian MD Electronically signed by Oswaldo Milian MD Signature Date/Time: 08/05/2022/4:04:01 PM    Final    CT VENOGRAM HEAD  Result Date: 08/04/2022 CLINICAL DATA:  Dural venous sinus thrombosis suspected. EXAM: CT VENOGRAM HEAD TECHNIQUE: Venographic phase images of the brain were obtained following the administration of intravenous contrast. Multiplanar reformats and maximum intensity projections were generated. RADIATION DOSE REDUCTION: This exam was performed according to the departmental dose-optimization program which includes automated exposure control, adjustment of the mA and/or kV according to patient size and/or use of iterative reconstruction technique. CONTRAST:  37m OMNIPAQUE IOHEXOL 350 MG/ML SOLN COMPARISON:  Head and neck CTA 08/04/2022.  Head MRI 08/02/2022. FINDINGS: The recent MRI demonstrated numerous acute infarcts involving both cerebral hemispheres and cerebellum, the majority of which are occult by CT. No acute intracranial hemorrhage, midline shift, or extra-axial fluid collection is identified. The ventricles are normal in size. No fracture or suspicious osseous lesion is identified. There is minimal mucosal thickening in the left sphenoid sinus. The mastoid air cells are clear. The orbits are unremarkable. CT venogram images are motion degraded, however the superior sagittal sinus, internal cerebral veins, vein of Galen, straight sinus, and sigmoid sinuses are enhancing without evidence of thrombosis. The transverse sinuses are small but grossly patent. A small filling defect in the posterior aspect of the superior sagittal sinus is compatible with an arachnoid granulation. IMPRESSION: No evidence of dural venous sinus thrombosis. Electronically Signed   By: ALogan BoresM.D.   On: 08/04/2022 16:45   CT ANGIO HEAD NECK W WO CM  Addendum Date: 08/04/2022   ADDENDUM  REPORT: 08/04/2022 11:52 ADDENDUM: Findings were discussed with Dr. VEliseo Squireson 08/04/22 at 11:50 AM. Electronically Signed   By: HMarin RobertsM.D.   On: 08/04/2022 11:52   Result Date: 08/04/2022 CLINICAL DATA:  Stroke follow-up EXAM: CT ANGIOGRAPHY HEAD AND NECK TECHNIQUE: Multidetector CT imaging of  the head and neck was performed using the standard protocol during bolus administration of intravenous contrast. Multiplanar CT image reconstructions and MIPs were obtained to evaluate the vascular anatomy. Carotid stenosis measurements (when applicable) are obtained utilizing NASCET criteria, using the distal internal carotid diameter as the denominator. RADIATION DOSE REDUCTION: This exam was performed according to the departmental dose-optimization program which includes automated exposure control, adjustment of the mA and/or kV according to patient size and/or use of iterative reconstruction technique. CONTRAST:  20m OMNIPAQUE IOHEXOL 350 MG/ML SOLN COMPARISON:  MRI Head 08/02/22 FINDINGS: CT HEAD FINDINGS Brain: No evidence of hemorrhage, hydrocephalus, extra-axial collection or mass lesion/mass effect. 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 Vascular: See below Skull: Normal. Negative for fracture or focal lesion. Sinuses/Orbits: No acute finding. Other: None. Review of the MIP images confirms the above findings CTA NECK FINDINGS Aortic arch: Standard branching. Imaged portion shows no evidence of aneurysm or dissection. No significant stenosis of the major arch vessel origins. Right carotid system: No evidence of dissection, stenosis (50% or greater), or occlusion. Left carotid system: No evidence of dissection, stenosis (50% or greater), or occlusion. Vertebral arteries: Codominant. No evidence of dissection, stenosis (50% or greater), or  occlusion. Skeleton: Negative. Other neck: Asymmetrically enlarged right level 5 lymph node measuring up to 6 mm (series 9, image 122). Additional enlarged right level 5 lymph node is present more superiorly measuring 9 mm (series 9, image 106). Enlarged 7 mm left level 5 lymph node is also present (series 9, image 115). 9 mm right level 2A lymph node (series 9, image 94). There is soft tissue stranding along the posterior right suboccipital neck (series 9, image 87), nonspecific. Upper chest: Small bilateral pleural effusions. There is a 1.2 cm right upper lobe pulmonary nodule, better assessed on prior CT chest dated 08/04/2019. There also multiple enlarged mediastinal lymph nodes and left axillary lymph node. Review of the MIP images confirms the above findings CTA HEAD FINDINGS Anterior circulation: No significant stenosis, proximal occlusion, aneurysm, or vascular malformation. Posterior circulation: No significant stenosis, proximal occlusion, aneurysm, or vascular malformation. Venous sinuses: There is a congenitally small left transverse and sigmoid sinus. There is decreased opacification of the right-sided transverse and sigmoid sinus (series 11, image 122). This area of decreased opacification extends proximally to involve the superior sagittal sinus hazy month Anatomic variants: None Review of the MIP images confirms the above findings IMPRESSION: 1. Asymmetrically decreased opacification of the right-sided transverse and sigmoid sinus is worrisome for dural venous sinus thrombosis. Recommend further evaluation with a CT venogram. 2. No intracranial LVO or significant arterial stenosis in the neck. 3. No acute intracranial process. 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 4. Multiple enlarged lymph nodes in the neck, mediastinum, and left  axilla, which are compatible with clinically suspected lymphoma. 5. Partially imaged pleural effusions and right sided pulmonary nodule, better seen on prior CTA chest. Electronically Signed: By: HMarin RobertsM.D. On: 08/04/2022 11:17   VAS [place] EXTREMITY VENOUS (DVT)  Result Date: 08/03/2022  Lower Venous DVT Study [name] Date of Exam:   08/03/2022 Medical Rec #: [id number]     Accession #:  [id number] Date of Birth: 1956/05/09      Patient Gender: F Patient Age:   26 years Exam Location:  [facility] Procedure:      VAS [place] LOWER EXTREMITY VENOUS (DVT) Referring Phys: TIMOTHY OPYD --------------------------------------------------------------------------------  Indications: Elevated d-dimer, lymphoma.  Comparison       06-15-2021 Prior left lower extremity venous was negative for Study:           DVT. Performing Technologist: Darlin Coco RDMS, RVT  Examination Guidelines: A complete evaluation includes B-mode imaging, spectral Doppler, color Doppler, and power Doppler as needed of all accessible portions of each vessel. Bilateral testing is considered an integral part of a complete examination. Limited examinations for reoccurring indications may be performed as noted. The reflux portion of the exam is performed with the patient in reverse Trendelenburg.  +---------+---------------+---------+-----------+----------+--------------+ RIGHT    CompressibilityPhasicitySpontaneityPropertiesThrombus Aging +---------+---------------+---------+-----------+----------+--------------+ CFV      Full           Yes      Yes                                 +---------+---------------+---------+-----------+----------+--------------+ SFJ      Full                                                        +---------+---------------+---------+-----------+----------+--------------+ FV Prox  Full                                                         +---------+---------------+---------+-----------+----------+--------------+ FV Mid   Full                                                        +---------+---------------+---------+-----------+----------+--------------+ FV DistalFull                                                        +---------+---------------+---------+-----------+----------+--------------+ PFV      Full                                                        +---------+---------------+---------+-----------+----------+--------------+ POP      Full           Yes      Yes                                 +---------+---------------+---------+-----------+----------+--------------+ PTV      Full                                                        +---------+---------------+---------+-----------+----------+--------------+  PERO     Full                                                        +---------+---------------+---------+-----------+----------+--------------+ Gastroc  Full                                                        +---------+---------------+---------+-----------+----------+--------------+   +---------+---------------+---------+-----------+----------+--------------+ LEFT     CompressibilityPhasicitySpontaneityPropertiesThrombus Aging +---------+---------------+---------+-----------+----------+--------------+ CFV      Full           Yes      Yes                                 +---------+---------------+---------+-----------+----------+--------------+ SFJ      Full                                                        +---------+---------------+---------+-----------+----------+--------------+ FV Prox  Full                                                        +---------+---------------+---------+-----------+----------+--------------+ FV Mid   Full                                                         +---------+---------------+---------+-----------+----------+--------------+ FV DistalFull                                                        +---------+---------------+---------+-----------+----------+--------------+ PFV      Full                                                        +---------+---------------+---------+-----------+----------+--------------+ POP      Full           Yes      Yes                                 +---------+---------------+---------+-----------+----------+--------------+ PTV      Full                                                        +---------+---------------+---------+-----------+----------+--------------+  PERO     Partial        No       No                   Acute          +---------+---------------+---------+-----------+----------+--------------+ Gastroc  Full                                                        +---------+---------------+---------+-----------+----------+--------------+     Summary: RIGHT: - There is no evidence of deep vein thrombosis in the lower extremity.  - No cystic structure found in the popliteal fossa.  - Ultrasound characteristics of enlarged lymph nodes are noted in the groin.  LEFT: - Findings consistent with acute deep vein thrombosis involving the left peroneal veins.  - No cystic structure found in the popliteal fossa.  - Ultrasound characteristics of enlarged lymph nodes noted in the groin.  *See table(s) above for measurements and observations. Electronically signed by Harold Barban MD on 08/03/2022 at 9:30:05 PM.    Final    ECHOCARDIOGRAM COMPLETE  Result Date: 08/03/2022    ECHOCARDIOGRAM REPORT   Patient Name:   Sarah Walsh Date of Exam: 08/03/2022 Medical Rec #:  [id number]     Height:       63.0 in Accession #:    [id number]    Weight:       160.0 lb Date of Birth:  May 31, 1956     BSA:          1.759 m Patient Age:    51 years      BP:           152/94 mmHg Patient Gender: F              HR:           110 bpm. Exam Location:  Inpatient Procedure: 2D Echo Indications:    stroke  History:        Patient has prior history of Echocardiogram examinations and                 Patient has no prior history of Echocardiogram examinations.                 Sepsis; Risk Factors:Hypertension.  Sonographer:    Johny Chess RDCS Referring Phys: [id number] RIPUDEEP K RAI  Sonographer Comments: Image acquisition challenging due to uncooperative patient. IMPRESSIONS  1. Left ventricular ejection fraction, by estimation, is 60 to 65%. The left ventricle has normal function. The left ventricle has no regional wall motion abnormalities. There is mild concentric left ventricular hypertrophy. Left ventricular diastolic parameters are consistent with Grade I diastolic dysfunction (impaired relaxation).  2. Right ventricular systolic function is normal. The right ventricular size is normal. Tricuspid regurgitation signal is inadequate for assessing PA pressure.  3. The mitral valve is normal in structure. No evidence of mitral valve regurgitation. No evidence of mitral stenosis.  4. The aortic valve is tricuspid. There is mild calcification of the aortic valve. Aortic valve regurgitation is not visualized. No aortic stenosis is present.  5. The inferior vena cava is normal in size with greater than 50% respiratory variability, suggesting right atrial pressure of 3 mmHg. FINDINGS  Left Ventricle: Left ventricular ejection fraction, by estimation, is 60 to 65%. The  left ventricle has normal function. The left ventricle has no regional wall motion abnormalities. The left ventricular internal cavity size was normal in size. There is  mild concentric left ventricular hypertrophy. Left ventricular diastolic parameters are consistent with Grade I diastolic dysfunction (impaired relaxation). Right Ventricle: The right ventricular size is normal. No increase in right ventricular wall thickness. Right ventricular systolic function is  normal. Tricuspid regurgitation signal is inadequate for assessing PA pressure. Left Atrium: Left atrial size was normal in size. Right Atrium: Right atrial size was normal in size. Pericardium: There is no evidence of pericardial effusion. Mitral Valve: The mitral valve is normal in structure. There is mild calcification of the mitral valve leaflet(s). Mild mitral annular calcification. No evidence of mitral valve regurgitation. No evidence of mitral valve stenosis. Tricuspid Valve: The tricuspid valve is normal in structure. Tricuspid valve regurgitation is not demonstrated. Aortic Valve: The aortic valve is tricuspid. There is mild calcification of the aortic valve. Aortic valve regurgitation is not visualized. No aortic stenosis is present. Pulmonic Valve: The pulmonic valve was normal in structure. Pulmonic valve regurgitation is not visualized. Aorta: The aortic root is normal in size and structure. Venous: The inferior vena cava is normal in size with greater than 50% respiratory variability, suggesting right atrial pressure of 3 mmHg. IAS/Shunts: No atrial level shunt detected by color flow Doppler.  LEFT VENTRICLE PLAX 2D LVIDd:         4.20 cm LVIDs:         2.40 cm LV PW:         0.90 cm LV IVS:        1.20 cm LVOT diam:     1.60 cm LV SV:         48 LV SV Index:   27 LVOT Area:     2.01 cm  RIGHT VENTRICLE             IVC RV S prime:     20.30 cm/s  IVC diam: 1.30 cm TAPSE (M-mode): 1.6 cm LEFT ATRIUM             Index        RIGHT ATRIUM          Index LA diam:        3.40 cm 1.93 cm/m   RA Area:     9.64 cm LA Vol (A2C):   26.3 ml 14.95 ml/m  RA Volume:   19.10 ml 10.86 ml/m LA Vol (A4C):   27.5 ml 15.64 ml/m LA Biplane Vol: 27.9 ml 15.86 ml/m  AORTIC VALVE LVOT Vmax:   152.00 cm/s LVOT Vmean:  105.000 cm/s LVOT VTI:    0.238 m  AORTA Ao Root diam: 3.00 cm MV E velocity: 79.60 cm/s MV A velocity: 141.00 cm/s  SHUNTS MV E/A ratio:  0.56         Systemic VTI:  0.24 m                              Systemic Diam: 1.60 cm Dalton McleanMD Electronically signed by Franki Monte Signature Date/Time: 08/03/2022/2:41:22 PM    Final    CT Angio Chest Pulmonary Embolism (PE) W or WO Contrast  Result Date: 08/03/2022 CLINICAL DATA:  Pulmonary embolism suspected, positive D-dimer. Undergoing malignancy workup with recent biopsy suggesting lymphoma. EXAM: CT ANGIOGRAPHY CHEST WITH CONTRAST TECHNIQUE: Multidetector CT imaging of the chest was performed using the standard protocol  during bolus administration of intravenous contrast. Multiplanar CT image reconstructions and MIPs were obtained to evaluate the vascular anatomy. RADIATION DOSE REDUCTION: This exam was performed according to the departmental dose-optimization program which includes automated exposure control, adjustment of the mA and/or kV according to patient size and/or use of iterative reconstruction technique. CONTRAST:  15m OMNIPAQUE IOHEXOL 350 MG/ML SOLN COMPARISON:  None Available. FINDINGS: Cardiovascular: The heart is borderline enlarged. There is no pericardial effusion. Scattered coronary artery calcifications are noted. There is atherosclerotic calcification of the aorta without evidence of aneurysm. Pulmonary trunk is normal in caliber. No pulmonary artery filling defect is seen. Examination is limited due to respiratory motion artifact and pulmonary nodules. Mediastinum/Nodes: Multiple enlarged lymph nodes are present in the mediastinum and hilar regions bilaterally. No axillary lymphadenopathy. Thyroid gland, trachea, and esophagus are within normal limits. Lungs/Pleura: Hazy regional ground-glass opacities are present in the lungs bilaterally. There are multiple scattered pulmonary nodules bilaterally, the largest in the right lower lobe measuring 3.7 cm, axial image 110 and in the left lower lobe measuring 4.1 cm, axial image 94. No effusion or pneumothorax. Upper Abdomen: Enlarged lymph nodes are present in the gastrohepatic ligament  measuring up to 1.1 cm. No acute abnormality. Musculoskeletal: No acute or suspicious osseous abnormality. Review of the MIP images confirms the above findings. IMPRESSION: 1. No evidence of pulmonary embolism. 2. Multiple enlarged mediastinal and hilar lymph nodes and pulmonary nodules bilaterally measuring up to 4.1 cm, concerning for neoplastic process or lymphoma. Correlation with recent biopsy results is recommended. 3. Mild hazy geographic ground-glass opacities in the lungs, possible edema or air trapping. 4. Coronary artery calcifications. 5. Aortic atherosclerosis. Electronically Signed   By: LBrett FairyM.D.   On: 08/03/2022 01:10   MR BRAIN WO CONTRAST  Result Date: 08/02/2022 CLINICAL DATA:  Altered mental status EXAM: MRI HEAD WITHOUT CONTRAST TECHNIQUE: Multiplanar, multiecho pulse sequences of the brain and surrounding structures were obtained without intravenous contrast. COMPARISON:  04/15/2013 FINDINGS: Brain: Numerous, diffuse bilateral foci of abnormal diffusion restriction in a pattern that is most suggestive of embolic shower but could also indicate watershed ischemia. No acute hemorrhage. There is multifocal hyperintense T2-weighted signal within the white matter. Generalized volume loss. The midline structures are normal. Vascular: Major flow voids are preserved. Skull and upper cervical spine: Normal calvarium and skull base. Visualized upper cervical spine and soft tissues are normal. Sinuses/Orbits:No paranasal sinus fluid levels or advanced mucosal thickening. No mastoid or middle ear effusion. Normal orbits. IMPRESSION: 1. Numerous, diffuse bilateral foci of abnormal diffusion restriction in a pattern that is most suggestive of embolic shower but could also indicate watershed ischemia. 2. No acute hemorrhage or mass effect. Electronically Signed   By: KUlyses JarredM.D.   On: 08/02/2022 23:57   CT Head Wo Contrast  Result Date: 08/02/2022 CLINICAL DATA:  Altered mental  status, recent diagnosis of lymphoma EXAM: CT HEAD WITHOUT CONTRAST TECHNIQUE: Contiguous axial images were obtained from the base of the skull through the vertex without intravenous contrast. RADIATION DOSE REDUCTION: This exam was performed according to the departmental dose-optimization program which includes automated exposure control, adjustment of the mA and/or kV according to patient size and/or use of iterative reconstruction technique. COMPARISON:  MR brain done on 04/15/2013 FINDINGS: Brain: No acute intracranial findings are seen in noncontrast CT brain. There are no signs of bleeding within the cranium. Ventricles are not dilated. Cortical sulci are prominent. There is no focal edema or mass effect. Vascular: Unremarkable Skull:  Unremarkable. Sinuses/Orbits: Unremarkable. Other: None. IMPRESSION: No acute intracranial findings are seen in noncontrast CT brain. Atrophy. Electronically Signed   By: Elmer Picker M.D.   On: 08/02/2022 16:35   DG Chest 2 View  Result Date: 08/02/2022 CLINICAL DATA:  Cough, altered mental status x3 days EXAM: CHEST - 2 VIEW COMPARISON:  04/21/2021 FINDINGS: Cardiac size is within normal limits. Thoracic aorta is tortuous. There are nodular densities of varying sizes scattered throughout both lungs largest overlying the inferior right hilum measuring 2.8 cm. Findings suggest possible metastatic disease. Less likely possibility would be nodular appearing multifocal pneumonia. There is no pleural effusion or pneumothorax. Degenerative changes are noted in both shoulders. IMPRESSION: There are multiple nodular densities of varying sizes in both lungs suggesting pulmonary metastatic disease. Follow-up CT, PET-CT and biopsy as warranted should be considered. Please correlate with clinical history for any known primary malignancy. Electronically Signed   By: Elmer Picker M.D.   On: 08/02/2022 15:37     PHYSICAL EXAM  Temp:  [97.9 F (36.6 C)-98.4 F (36.9  C)] 98.4 F (36.9 C) (10/20 1610) Pulse Rate:  [78-102] 78 (10/20 1610) Resp:  [16-20] 19 (10/20 1610) BP: (119-137)/(78-87) 129/78 (10/20 1610) SpO2:  [92 %-96 %] 94 % (10/20 1610) Weight:  [79.1 kg] 79.1 kg (10/20 0519)  General - Well nourished, well developed, not in acute distress  Ophthalmologic - fundi not visualized due to noncooperation.  Cardiovascular - Regular rhythm and rate  Neuro - eyes closed, lethargic, occasionally open eyes, not tracking, not blinking to visual threat bilaterally. She has no language output today. Did not answer questions. Seems able to follow commands on foot movement, but not follow midline commands. No facial droop. Tongue protrusion not cooperative. No movement to pain in UEs and 2/5 knee extension LEs but not able to do knee flexion per request, b/l toe movement 4/5. Sensation, coordination and gait not tested.   ASSESSMENT/PLAN Ms. COOPER STAMP is a 66 y.o. female with history of recent diagnosed lymphoma, recent MRSA infection admitted for altered mental status, lethargy and not talking. No tPA given due to outside window.    Stroke:  bilateral embolic shower, etiology not quite clear, however could be due to distal DVT in the setting of small PFO or viscosity of blood from lymphoma with significant leukocytosis CT no acute abnormality MRI 81/44 embolic shower bilaterally CTA chest no PE CT head and neck and CTV unremarkable MRI 10/13 extension of embolic shower bilaterally 2D Echo EF 60 to 65% LE venous Doppler left peroneal vein DVT TEE showed small PFO LDL 91 HgbA1c 6.0 Ammonia level normal Heparin IV for VTE prophylaxis No antithrombotic prior to admission, now on heparin IV.  On amantadine 200 bid for arousal  Ongoing aggressive stroke risk factor management Therapy recommendations: CIR Disposition: Pending, palliative care on board, prognosis guarding but likely poor  Hypertension Stable Long term BP goal  normotensive  Hyperlipidemia Home meds: Zetia LDL 91, goal < 70 Intolerance to statin Continue zetia at discharge  Fever and cough, resolved Tmax 101.4->afebrile->99.8-> afebrile Intermittent coughing, improved Blood culture negative, urine culture showed pansensitive E. Coli Off Rocephin, Flagyl and vancomycin now CXR Mild left basilar subsegmental atelectasis is noted with small left pleural effusion S/p BiPAP 10/19 CCM on board  ? OSA Daughter reported that 10/19 after overnight BiPAP, patient doing well, able to say " I love you to" " I guess" " I cannot wait".  However 10/19 night she did not receive  BiPAP, and 10/20 patient was lethargic, drowsy and only speak single words.   Per daughter, patient at home does have snoring and intermittent apnea during sleep but never formally diagnosed with OSA. Discussed with Dr. Florene Glen about trial of BiPAP or CPAP at night. Pt can not remove mask which may not qualify but daughter willing to stay at night to watch and help.   Dysphagia Did not pass swallow n.p.o. now Speech on board cortrak placed for nutrition  PFO LE DVT Small PFO on TEE LE venous Doppler left peroneal vein DVT On heparin IV  Lymphoma, untreated Recent diagnosis at [facility] Dr. Lorenso Courier on board WBC 48.8->48.2->46.3->55.1->63.8->64.6->65.0->68.3->44.1->46.7 Viscosity of the blood can be part of the stroke etiology  Other Stroke Risk Factors Advanced age  Other Active Problems Recent MRSA infection, completed vancomycin course  Hospital day # 9  I had long discussion with daughter at bedside, updated pt current condition, treatment plan and potential prognosis, and answered all the questions. She expressed understanding and appreciation.  I also discussed with Dr. Florene Glen.    Rosalin Hawking, MD PhD Stroke Neurology 08/12/2022 6:38 PM     To contact Stroke Continuity provider, please refer to [email or web address] After hours, contact General Neurology

## 2022-08-12 NOTE — Progress Notes (Addendum)
Nutrition Follow-up  DOCUMENTATION CODES:   Non-severe (moderate) malnutrition in context of acute illness/injury  INTERVENTION:   Tube Feeds via Cortrak: - Jevity 1.5 at 85 mL/hr x 14 hours during the day (start after pt is off BiPAP and stop 1 hour before starting); start Jevity 1.5 at 45 mL/hr and advance by 10 mL q4h to goal rate - ProSource TF20 - TID - 170 mL free water flush q4h - Provides 2025 kcal, 136 gm protein, and 1924 mL total free water daily.    Monitor magnesium, potassium, and phosphorus BID for at least 3 days, MD to replete as needed, as pt is at risk for refeeding syndrome given malnutrition and poor PO >7 days.  NUTRITION DIAGNOSIS:   Moderate Malnutrition related to acute illness as evidenced by mild fat depletion, moderate muscle depletion. - Ongoing   GOAL:   Patient will meet greater than or equal to 90% of their needs - Ongoing   MONITOR:   Diet advancement, Labs, TF tolerance, Skin, I & O's  REASON FOR ASSESSMENT:   Consult Enteral/tube feeding initiation and management  ASSESSMENT:   66 y.o. female presented to the ED with lethargy, confusion, anorexia, and balance difficulty. PM includes T2DM, HTN, and new lymphoma undergoing work-up for it. Pt admitted with SIRS, acute encephalopathy, and acute CVA.   10/10 - admitted 10/16 - Cortrak placed (tip gastric) 10/18 - Rapid Response called; BiPAP started; TF held   Pt now off BiPAP, possible restart TF today. MD agreeable to restart.  Pt back to minimally responding to  providers. Discussed with family at bedside, no questions.   Addendum @ 2000: MD reached out to have tube feeds off during the day due to trial of BiPAP overnight. RD to change rate to be during the day only.  Medications reviewed and include: Colace, Lasix, Senokot, IV Heparin  Labs reviewed: Potassium 3.3   Diet Order:   Diet Order     None       EDUCATION NEEDS:   Not appropriate for education at this time  Skin:   Skin Assessment: Reviewed RN Assessment  Last BM:  10/18  Height:   Ht Readings from Last 1 Encounters:  08/02/22 '5\' 3"'$  (1.6 m)    Weight:   Wt Readings from Last 1 Encounters:  08/12/22 79.1 kg    Ideal Body Weight:  52.3 kg  BMI:  Body mass index is 30.89 kg/m.  Estimated Nutritional Needs:   Kcal:  2000-2200  Protein:  100-115 grams  Fluid:  >/= 2 L    Hermina Barters RD, LDN Clinical Dietitian See [facility] for contact information.

## 2022-08-12 NOTE — Progress Notes (Addendum)
Tube feeding held tonight so patient can be on biPap during the evening. Spoke with Dr. Florene Glen regarding starting at 1800 and instructed to hold for tonight and he will reevaluate in am and discuss tube feeding schedule with dietician after evaluating how patient does with biPap tonight.  1630 notified pharmacist of above.

## 2022-08-12 NOTE — TOC Initial Note (Addendum)
Transition of Care [facility]) - Initial/Assessment Note    Patient Details  Name: Sarah Walsh MRN: [id number] Date of Birth: 04-Jul-1956  Transition of Care [facility]) CM/SW Contact:    Bethena Roys, RN Phone Number: 08/12/2022, 12:30 PM  Clinical Narrative: Patient presented for lethargy and confusion. Per MD notes, MRI revealed extensive infarcts. Palliative Care has been consulted and is following the patient. Case Manager will continue to follow for transition of care needs.                  Expected Discharge Plan:  (TBD) Barriers to Discharge: Continued Medical Work up  Expected Discharge Plan and Services Expected Discharge Plan:  (TBD) In-house Referral: Clinical Social Work Discharge Planning Services: CM Consult   Admission diagnosis:  Encephalopathy acute [G93.40] SIRS (systemic inflammatory response syndrome) ([place]) [R65.10] Sepsis ([place]) [A41.9] Patient Active Problem List   Diagnosis Date Noted   Acute respiratory failure with hypoxia ([place])    Malnutrition of moderate degree 08/08/2022   DVT (deep venous thrombosis) ([place]) 01/77/9390   Acute metabolic encephalopathy 30/06/2329   Acute CVA (cerebrovascular accident) ([place]) 08/03/2022   Sepsis ([place]) 08/03/2022   SIRS (systemic inflammatory response syndrome) ([place]) 08/02/2022   Encephalopathy acute 08/02/2022   Lung nodules 08/02/2022   Lymphoma ([place]) 08/02/2022   Positive D dimer 08/02/2022   Status post total left knee replacement 05/03/2021   Osteopenia 04/23/2021   Primary osteoarthritis of left knee 02/09/2021   Breast implant rupture 10/13/2020   Osteoarthritis of spine with radiculopathy, lumbar region 07/15/2020   Myalgia due to statin 02/24/2020   Reactive depression 11/06/2019   Colon polyp 05/29/2019   Controlled type 2 diabetes mellitus without complication, without long-term current use of insulin ([place]) 03/22/2019   Patellofemoral pain syndrome of both knees 07/04/2018   Lower extremity edema  03/03/2017   Vitamin D deficiency 11/23/2015   Combined hyperlipidemia associated with type 2 diabetes mellitus ([place]) 10/22/2015   Primary osteoarthritis of both knees 10/22/2015   Statin intolerance 10/22/2015   Essential hypertension 11/05/2008   PCP:  Sueanne Margarita, DO Pharmacy:   [facility] [address] [place] [address] Phone: [phone] Fax: [phone]  Readmission Risk Interventions     No data to display

## 2022-08-12 NOTE — Progress Notes (Signed)
ANTICOAGULATION CONSULT NOTE - Follow Up Consult  Pharmacy Consult for heparin Indication:  multiple embolic strokes with paradoxical embolism and DVT  Labs: Recent Labs    08/10/22 0525 08/11/22 0149 08/12/22 0200  HGB 11.9* 11.5* 12.1  HCT 35.6* 34.1* 36.4  PLT 241 226 308  HEPARINUNFRC 0.64 0.68 0.67  CREATININE 0.62 0.62 0.68     Assessment: 66yo female subtherapeutic on heparin with initial cautious dosing for paradoxical embolism and DVT in setting of multiple embolic strokes.   Heparin level 0.67 and above goal. Hg stable  Goal of Therapy:  Heparin level 0.3-0.5 units/ml per neurology   Plan:  Decrease heparin to 1100 units/hr Heparin level in 6 hours and daily wth CBC daily Will follow plans for oral anticoagulation  Hildred Laser, PharmD Clinical Pharmacist **Pharmacist phone directory can now be found on [email or web address] ([email or web address]).  Listed under [place].

## 2022-08-12 NOTE — Progress Notes (Signed)
ANTICOAGULATION CONSULT NOTE - Follow Up Consult  Pharmacy Consult for heparin Indication:  multiple embolic strokes with paradoxical embolism and DVT  Labs: Recent Labs    08/10/22 0525 08/11/22 0149 08/12/22 0200 08/12/22 1640  HGB 11.9* 11.5* 12.1  --   HCT 35.6* 34.1* 36.4  --   PLT 241 226 308  --   HEPARINUNFRC 0.64 0.68 0.67 0.38  CREATININE 0.62 0.62 0.68  --      Assessment: 66yo female subtherapeutic on heparin with initial cautious dosing for paradoxical embolism and DVT in setting of multiple embolic strokes.   Heparin level came back in goal this PM. We will continue at current rate and check in AM.   Goal of Therapy:  Heparin level 0.3-0.5 units/ml per neurology   Plan:  Cont heparin 1100 units/hr Heparin level  daily wth CBC daily Will follow plans for oral anticoagulation  Onnie Boer, PharmD, BCIDP, AAHIVP, CPP Infectious Disease Pharmacist 08/12/2022 6:21 PM

## 2022-08-12 NOTE — Progress Notes (Signed)
PROGRESS NOTE    Sarah Walsh  FSF:[id number] DOB: August 04, 1956 DOA: 08/02/2022 PCP: Sueanne Margarita, DO  Chief Complaint  Patient presents with   Altered Mental Status    Brief Narrative:  Sarah Walsh is Sarah Walsh 66 y.o. female with Sarah Walsh history of T2DM, HLD, HTN, recent cervical LN biopsy with +lymphoma reportedly T cell, not yet started on treatment, presented to ED 10/10 with lethargy, confusion, anorexia, gait instability. MRI brain was positive for scattered cortical and subcortical infarcts. She was admitted, also noted to have fevers, WBC 33.4D with eosinophilic predominance.    Repeat MRI due to worsening level of consciousness showed extension of embolic shower strokes. EEG nonspecific without seizure recorded. Broad IV antibiotics have been continues empirically though no nidus of infection has been identified. Goals of care discussions are ongoing. Palliative care is consulted to assist.   Assessment & Plan:   Principal Problem:   Sepsis ([place]) Active Problems:   Essential hypertension   Encephalopathy acute   Lymphoma ([place])   DVT (deep venous thrombosis) (H[facility])   Acute metabolic encephalopathy   Acute CVA (cerebrovascular accident) ([place])   Malnutrition of moderate degree   Acute respiratory failure with hypoxia (H[facility])   Assessment and Plan: Seizure Like Activity EEG without seizures or epileptiform discharges Appreciate additional neuro recs  Acute Hypoxic Respiratory Failure Increased wob today with diminished breath sounds, expiratory wheezing Prior CT showed no PE (10/11 imaging), but did show multiple enlarged mediastinal and hilar LN's and pulm nodules measuring up to 4.1 CM as well as mild hazy geographic ggo's in the lungs, possible edema or air trapping CXR 10/18 with bilateral pulm nodules concerning for metastatic disease No true fevers, leukocytosis related to lymphoma Required bipap 10/18 PCCM consulted, now signed off.   Will continue lasix, steroids (short  course).  Can use bipap as needed for wob, family wants to try nightly given her improvement yesterday.  Low threshold for repeat imaging, restarting abx.  Sepsis: Patient met sepsis criteria on admission with tachycardia, fevers, leukocytosis, lactic acidosis, source unclear.   - afebrile at this point, leukocytosis related to lymphoma, low threshold to restart abx in setting of above, but lower suspicion for infectious cause at this point - bcx NGTD from 10/10. DDx fever includes due to lymphoma, central, less likely DVT in and of itself. - With negative cultures, completed ceftriaxone 2g q24h, vancomycin, flagyl x7 days (10/16). - Fungitell negative - Scheduled tylenol - Fever curve improved   Acute metabolic encephalopathy: Most likely related to widespread embolic CVAs.  - EEG with nonspecific changes without seizure activity noted initially. EEG repeated and unchanged 10/15. Anticipate some waxing/waning.  - Amantadine started per neurology for arousal - Limited neurological recovery at this time. Appreciate neurology recommendations.   Extensive bilateral cortical and subcortical ischemic CVAs: MRI brain showed numerous diffuse bilateral foci of abnormal diffusion restriction in Sarah Walsh pattern most suggestive of embolic shower but could also indicate watershed ischemia.  - repeat MRI 10/13 showed extensive acute infarcts throughout both cerebral and cerebellar hemispheres across multiple vascular distributions again favored embolic (with ddx including watershed ischemia) - increased in extent from MRI done previously  - PFO found on TEE. In conjunction with know DVT (albeit distal) and embolic distribution, started heparin, goal level 0.3-0.5.  - Neuro checks to continue - Cortrak inserted 10/16 - holding tube feeds at this point with respiratory distress requiring bipap, consider resumption tomorrow - We are pursing palliative care discussions with the family daily. They  are very involved and  have Sarah Walsh realistic understanding of expected poor prognosis, though it is ultimate status is yet to be determined. Will consult palliative for ongoing discussions.     Dark urine: Due to bilirubinuria due to lymphoma. No WBCs on repeat UA.    Metabolic acidosis: No anion gap. Resolved.   Hypokalemia:  - improved   PSVT: Burden decreased on repeat tele review today.  - Convert IV to enteral metoprolol. BP tolerating.   HTN:  - BP goal per neurology, prn hydralazine ordered   Acute RLE peroneal DVT - CTA negative for PE.  - Heparin as discussed above.  - Interestingly, per Dr. Hadley Pen discussion with heme/onc, Dr. Lorenso Courier, lymphomas are not classically associated with hypercoagulability. The severity of leukocytosis is also not near the expected threshold to cause leukostasis.    T-cell lymphoma: Recent diagnosis, has not been started on treatment yet.   - Oncology, Dr. Lorenso Courier consulted. Of course the patient is not currently Sarah Walsh candidate for treatment and, given her normal cell counts otherwise, doubt there'd be any benefit to pulsed steroids at this time. There would certainly be risks as well. The family is aware of this discussion. - There is no further work up or treatment currently planned as an inpatient for this issue.    Eosinophilic leukocytosis:  - suspect this is related to her T cell lymphoma. Note subtypes of T cell lymphomas can aberrantly express IL-5.  - Work up has not suggested nephritis, hepatitis, She has no history of this prior to diagnosis, no ongoing drug/allergic reaction, no parasitic infection detected. Could be expected at the time of her drug eruption after Tx for MRSA recently though that agent was stopped and rash has resolved durably. HIV NR - Will plan to monitor CBC w/differential.  Eos down overall.     DVT prophylaxis: heparin Code Status: DNR after conversation with palliative 10/19 Family Communication: none today Disposition:   Status is:  Inpatient Remains inpatient appropriate because: continued need for inpatient care   Consultants:  PCCM Oncology Neurology  Procedures:  TEE IMPRESSIONS     1. Left ventricular ejection fraction, by estimation, is 55 to 60%. The  left ventricle has normal function.   2. Right ventricular systolic function is normal. The right ventricular  size is normal. There is moderately elevated pulmonary artery systolic  pressure.   3. No left atrial/left atrial appendage thrombus was detected.   4. The mitral valve is normal in structure. Mild mitral valve  regurgitation.   5. The aortic valve is tricuspid. Aortic valve regurgitation is not  visualized.   6. Evidence of atrial level shunting detected by color flow Doppler.  Agitated saline contrast bubble study was positive with shunting observed  within 3-6 cardiac cycles suggestive of interatrial shunt. Few bubbles  seen in left atrium after patient  coughed, suggesting small PFO. Small PFO confirmed visually with left to  right shunting.   7. No vegetation seen   Echo IMPRESSIONS     1. Left ventricular ejection fraction, by estimation, is 60 to 65%. The  left ventricle has normal function. The left ventricle has no regional  wall motion abnormalities. There is mild concentric left ventricular  hypertrophy. Left ventricular diastolic  parameters are consistent with Grade I diastolic dysfunction (impaired  relaxation).   2. Right ventricular systolic function is normal. The right ventricular  size is normal. Tricuspid regurgitation signal is inadequate for assessing  PA pressure.   3. The mitral  valve is normal in structure. No evidence of mitral valve  regurgitation. No evidence of mitral stenosis.   4. The aortic valve is tricuspid. There is mild calcification of the  aortic valve. Aortic valve regurgitation is not visualized. No aortic  stenosis is present.   5. The inferior vena cava is normal in size with greater than 50%   respiratory variability, suggesting right atrial pressure of 3 mmHg.  LE [place] Summary:  RIGHT:  - There is no evidence of deep vein thrombosis in the lower extremity.     - No cystic structure found in the popliteal fossa.     - Ultrasound characteristics of enlarged lymph nodes are noted in the  groin.     LEFT:  - Findings consistent with acute deep vein thrombosis involving the left  peroneal veins.     - No cystic structure found in the popliteal fossa.     - Ultrasound characteristics of enlarged lymph nodes noted in the groin.      Antimicrobials:  Anti-infectives (From admission, onward)    Start     Dose/Rate Route Frequency Ordered Stop   08/08/22 2100  cefTRIAXone (ROCEPHIN) 2 g in sodium chloride 0.9 % 100 mL IVPB        2 g 200 mL/hr over 30 Minutes Intravenous Every 24 hours 08/08/22 1818 08/09/22 0130   08/06/22 0200  vancomycin (VANCOCIN) IVPB 1000 mg/200 mL premix  Status:  Discontinued        1,000 mg 200 mL/hr over 60 Minutes Intravenous Every 12 hours 08/05/22 1934 08/08/22 1818   08/05/22 2100  cefTRIAXone (ROCEPHIN) 2 g in sodium chloride 0.9 % 100 mL IVPB  Status:  Discontinued        2 g 200 mL/hr over 30 Minutes Intravenous Every 24 hours 08/05/22 2029 08/08/22 1818   08/03/22 1000  metroNIDAZOLE (FLAGYL) IVPB 500 mg  Status:  Discontinued        500 mg 100 mL/hr over 60 Minutes Intravenous Every 12 hours 08/02/22 2206 08/08/22 1818   08/03/22 0600  ceFEPIme (MAXIPIME) 2 g in sodium chloride 0.9 % 100 mL IVPB  Status:  Discontinued        2 g 200 mL/hr over 30 Minutes Intravenous Every 8 hours 08/02/22 1918 08/05/22 2028   08/03/22 0330  vancomycin (VANCOREADY) IVPB 750 mg/150 mL  Status:  Discontinued        750 mg 150 mL/hr over 60 Minutes Intravenous Every 12 hours 08/02/22 1918 08/05/22 1934   08/02/22 1930  vancomycin (VANCOREADY) IVPB 1500 mg/300 mL        1,500 mg 150 mL/hr over 120 Minutes Intravenous  Once 08/02/22 1915 08/02/22 2207    08/02/22 1915  ceFEPIme (MAXIPIME) 2 g in sodium chloride 0.9 % 100 mL IVPB  Status:  Discontinued        2 g 200 mL/hr over 30 Minutes Intravenous  Once 08/02/22 1900 08/02/22 1918   08/02/22 1915  metroNIDAZOLE (FLAGYL) IVPB 500 mg        500 mg 100 mL/hr over 60 Minutes Intravenous  Once 08/02/22 1900 08/02/22 2135   08/02/22 1915  vancomycin (VANCOCIN) IVPB 1000 mg/200 mL premix  Status:  Discontinued        1,000 mg 200 mL/hr over 60 Minutes Intravenous  Once 08/02/22 1900 08/02/22 1915       Subjective: Nonverbal Ex husband at bedside - says today was not as good Sarah Walsh day as yesterday (stringing 3-4 words together  yesterday, only 1 word today)  Objective: Vitals:   08/12/22 0038 08/12/22 0519 08/12/22 0520 08/12/22 1610  BP: 119/84  137/87 129/78  Pulse: 81   78  Resp: _0 Temp: 97.9 F (36.6 C)  98.1 F (36.7 C) 98.4 F (36.9 C)  TempSrc: Oral  Oral Axillary  SpO2: 95%  96% 94%  Weight:  79.1 kg    Height:        Intake/Output Summary (Last 24 hours) at 08/12/2022 1709 Last data filed at 08/12/2022 1400 Gross per 24 hour  Intake 250 ml  Output 1375 ml  Net -1125 ml   Filed Weights   08/10/22 0449 08/11/22 0531 08/12/22 0519  Weight: 78.5 kg 82.2 kg 79.1 kg    Examination:  General: No acute distress. Cardiovascular: RRR Lungs: Ccomfortable on 4 L  Abdomen: Soft, nontender, nondistended Neurological: opens eyes to voice, doesn't follow commands for me, moving legs spontaneously Extremities: No clubbing or cyanosis. No edema.  Data Reviewed: I have personally reviewed following labs and imaging studies  CBC: Recent Labs  Lab 08/08/22 0152 08/09/22 0458 08/10/22 0525 08/11/22 0149 08/12/22 0200  WBC 64.6* 65.0* 68.3* 44.1* 46.7*  NEUTROABS 7.7 7.8* 9.6* 18.5* 19.1*  HGB 12.1 12.6 11.9* 11.5* 12.1  HCT 35.0* 38.6 35.6* 34.1* 36.4  MCV 95.1 98.5 97.5 95.8 96.0  PLT 208 221 241 226 025    Basic Metabolic Panel: Recent Labs  Lab  08/08/22 0152 08/08/22 1701 08/09/22 0458 08/09/22 1606 08/10/22 0525 08/11/22 0149 08/12/22 0200  NA 139  --  138  --  137 135 138  K 3.3*  --  3.6  --  3.5 4.4 4.2  CL 103  --  103  --  104 99 100  CO2 23  --  24  --  _1 GLUCOSE 124*  --  173*  --  206* 192* 171*  BUN 14  --  18  --  _2 CREATININE 0.64  --  0.63  --  0.62 0.62 0.68  CALCIUM 8.0*  --  8.1*  --  8.0* 8.0* 8.4*  MG  --    < > 2.1 2.1 2.1 2.0 2.4  PHOS  --    < > 1.9* 2.0* 1.9* 2.1* 2.8   < > = values in this interval not displayed.    GFR: Estimated Creatinine Clearance: 67 mL/min (by C-G formula based on SCr of 0.68 mg/dL).  Liver Function Tests: Recent Labs  Lab 08/09/22 0458 08/11/22 0149 08/12/22 0200  AST _3 ALT _4 ALKPHOS 76 66 66  BILITOT 0.1* 0.4 0.6  PROT 5.7* 5.5* 5.8*  ALBUMIN 2.1* 2.0* 2.2*    CBG: Recent Labs  Lab 08/11/22 1652 08/11/22 2012 08/12/22 0506 08/12/22 1230 08/12/22 1612  GLUCAP 194* 223* 94 148* 233*     Recent Results (from the past 240 hour(s))  Blood culture (routine x 2)     Status: None   Collection Time: 08/02/22  7:02 PM   Specimen: BLOOD  Result Value Ref Range Status   Specimen Description BLOOD LEFT ANTECUBITAL  Final   Special Requests   Final    BOTTLES DRAWN AEROBIC AND ANAEROBIC Blood Culture adequate volume   Culture   Final    NO GROWTH 5 DAYS Performed at [facility] [address], [place], [place] [address]    Report Status 08/07/2022 FINAL  Final  Urine Culture     Status: Abnormal   Collection Time: 08/02/22  9:37 PM   Specimen: Urine, Clean Catch  Result Value Ref Range Status   Specimen Description URINE, CLEAN CATCH  Final   Special Requests   Final    NONE Performed at [facility], [place] [address] [place], [place] [address]    Culture 10,000 COLONIES/mL ESCHERICHIA COLI (Sarah Walsh)  Final   Report Status 08/05/2022 FINAL  Final   Organism ID, Bacteria ESCHERICHIA COLI (Sarah Walsh)  Final       Susceptibility   Escherichia coli - MIC*    AMPICILLIN 8 SENSITIVE Sensitive     CEFAZOLIN <=4 SENSITIVE Sensitive     CEFEPIME <=0.12 SENSITIVE Sensitive     CEFTRIAXONE <=0.25 SENSITIVE Sensitive     CIPROFLOXACIN <=0.25 SENSITIVE Sensitive     GENTAMICIN <=1 SENSITIVE Sensitive     IMIPENEM <=0.25 SENSITIVE Sensitive     NITROFURANTOIN <=16 SENSITIVE Sensitive     TRIMETH/SULFA <=20 SENSITIVE Sensitive     AMPICILLIN/SULBACTAM 4 SENSITIVE Sensitive     PIP/TAZO <=4 SENSITIVE Sensitive     * 10,000 COLONIES/mL ESCHERICHIA COLI         Radiology Studies: EEG adult  Result Date: 2022-08-21 Lora Havens, MD     08-21-2022  3:36 PM Patient Name: ANGLIA BLAKLEY MRN: [id number] Epilepsy Attending: Lora Havens Referring Physician/Provider: Elodia Florence., MD Date: 2022-08-21 Duration: 27.03 mins Patient history: 66yo F with ams. EEG to evaluate for seizure Level of alertness: Awake AEDs during EEG study: None Technical aspects: This EEG study was done with scalp electrodes positioned according to the 10-20 International system of electrode placement. Electrical activity was reviewed with band pass filter of 1-_0 , sensitivity of 7 uV/mm, display speed of 73m/sec with [place] _1  notched filter applied as appropriate. EEG data were recorded continuously and digitally stored.  Video monitoring was available and reviewed as appropriate. Description: EEG showed continuous generalized 3 to 6 Hz theta-delta slowing. Hyperventilation and photic stimulation were not performed.   Of note, study was technically difficult due to significant chewing artifact. ABNORMALITY - Continuous slow, generalized IMPRESSION: This technically difficult study is suggestive of moderate diffuse encephalopathy, nonspecific etiology. No seizures or epileptiform discharges were seen throughout the recording. Priyanka OBarbra Sarks       Scheduled Meds:  acetaminophen  650 mg Per Tube Q6H   Or   acetaminophen   650 mg Rectal Q6H   amantadine  200 mg Per Tube BID   docusate  100 mg Per Tube QHS   ezetimibe  10 mg Per Tube Daily   feeding supplement (PROSource TF20)  60 mL Per Tube Daily   furosemide  40 mg Intravenous Daily   metoprolol tartrate  25 mg Per Tube BID   sennosides  5 mL Per Tube QHS   sodium chloride flush  3 mL Intravenous Q12H   Continuous Infusions:  sodium chloride Stopped (08/11/22 0008)   feeding supplement (JEVITY 1.5 CAL/FIBER) 1,000 mL (08/10/22 0200)   heparin 1,100 Units/hr (12023/10/291120)     LOS: 9 days    Time spent: over 30 min    CFayrene Helper MD [facility]   To contact the attending provider between 7A-7P or the covering provider during after hours 7P-7A, please log into the web site [email or web address] and access using universal [place] password for that web site. If you do not have the password, please call the hospital operator.  08/12/2022, 5:09 PM

## 2022-08-13 DIAGNOSIS — Z515 Encounter for palliative care: Secondary | ICD-10-CM | POA: Diagnosis not present

## 2022-08-13 DIAGNOSIS — Z7189 Other specified counseling: Secondary | ICD-10-CM | POA: Diagnosis not present

## 2022-08-13 DIAGNOSIS — A4102 Sepsis due to Methicillin resistant Staphylococcus aureus: Secondary | ICD-10-CM | POA: Diagnosis not present

## 2022-08-13 LAB — BASIC METABOLIC PANEL
Anion gap: 14 (ref 5–15)
BUN: 26 mg/dL — ABNORMAL HIGH (ref 8–23)
CO2: 28 mmol/L (ref 22–32)
Calcium: 8.6 mg/dL — ABNORMAL LOW (ref 8.9–10.3)
Chloride: 96 mmol/L — ABNORMAL LOW (ref 98–111)
Creatinine, Ser: 0.73 mg/dL (ref 0.44–1.00)
GFR, Estimated: >60 mL/min (ref >60)
Glucose, Bld: 123 mg/dL — ABNORMAL HIGH (ref 70–99)
Potassium: 4.1 mmol/L (ref 3.5–5.1)
Sodium: 138 mmol/L (ref 135–145)

## 2022-08-13 LAB — CBC WITH DIFFERENTIAL/PLATELET
Abs Immature Granulocytes: 0.88 10*3/uL — ABNORMAL HIGH (ref 0.00–0.07)
Basophils Absolute: 0.2 10*3/uL — ABNORMAL HIGH (ref 0.0–0.1)
Basophils Relative: 0 %
Eosinophils Absolute: 32.2 10*3/uL — ABNORMAL HIGH (ref 0.0–0.5)
Eosinophils Relative: 67 %
HCT: 35.3 % — ABNORMAL LOW (ref 36.0–46.0)
Hemoglobin: 11.8 g/dL — ABNORMAL LOW (ref 12.0–15.0)
Immature Granulocytes: 2 %
Lymphocytes Relative: 5 %
Lymphs Abs: 2.4 10*3/uL (ref 0.7–4.0)
MCH: 32.2 pg (ref 26.0–34.0)
MCHC: 33.4 g/dL (ref 30.0–36.0)
MCV: 96.2 fL (ref 80.0–100.0)
Monocytes Absolute: 0.7 10*3/uL (ref 0.1–1.0)
Monocytes Relative: 1 %
Neutro Abs: 12.2 10*3/uL — ABNORMAL HIGH (ref 1.7–7.7)
Neutrophils Relative %: 25 %
Platelets: 341 10*3/uL (ref 150–400)
RBC: 3.67 MIL/uL — ABNORMAL LOW (ref 3.87–5.11)
RDW: 17.1 % — ABNORMAL HIGH (ref 11.5–15.5)
WBC: 48.4 10*3/uL — ABNORMAL HIGH (ref 4.0–10.5)
nRBC: 0.1 % (ref 0.0–0.2)

## 2022-08-13 LAB — MAGNESIUM
Magnesium: 2.4 mg/dL (ref 1.7–2.4)
Magnesium: 2.2 mg/dL (ref 1.7–2.4)

## 2022-08-13 LAB — GLUCOSE, CAPILLARY
Glucose-Capillary: 124 mg/dL — ABNORMAL HIGH (ref 70–99)
Glucose-Capillary: 140 mg/dL — ABNORMAL HIGH (ref 70–99)
Glucose-Capillary: 217 mg/dL — ABNORMAL HIGH (ref 70–99)
Glucose-Capillary: 231 mg/dL — ABNORMAL HIGH (ref 70–99)
Glucose-Capillary: 215 mg/dL — ABNORMAL HIGH (ref 70–99)

## 2022-08-13 LAB — POTASSIUM: Potassium: 3.6 mmol/L (ref 3.5–5.1)

## 2022-08-13 LAB — HEPARIN LEVEL (UNFRACTIONATED): Heparin Unfractionated: 0.36 IU/mL (ref 0.30–0.70)

## 2022-08-13 NOTE — Progress Notes (Signed)
STROKE TEAM PROGRESS NOTE   SUBJECTIVE (INTERVAL HISTORY) Daughter and husband are at the bedside.  Per daughter, patient is doing a little better and is arousable and can speak a few words.  She can follow simple commands.  She has been having continuous writhing movements of her feet which she can suppress voluntarily with activity.  Family understands her poor prognosis and talking to palliative care team and no patient would not want to live with 24-hour nursing care in a skilled nursing facility and leaning towards comfort care. TEE suggested a very small PFO and lower extremity venous Dopplers showed peroneal vein DVT.  She is on IV heparin.  OBJECTIVE Temp:  [98 F (36.7 C)-98.8 F (37.1 C)] 98.8 F (37.1 C) (10/21 0743) Pulse Rate:  [78-94] 94 (10/21 1139) Cardiac Rhythm: Normal sinus rhythm (10/21 0803) Resp:  [17-19] 18 (10/21 0743) BP: (116-129)/(63-84) 116/63 (10/21 0743) SpO2:  [91 %-95 %] 91 % (10/21 0743) Weight:  [79.4 kg] 79.4 kg (10/21 0456)  Recent Labs  Lab 08/12/22 2005 08/12/22 2346 08/13/22 0405 08/13/22 0738 08/13/22 1256  GLUCAP 226* 177* 124* 140* 217*   Recent Labs  Lab 08/09/22 0458 08/09/22 1606 08/10/22 0525 08/11/22 0149 08/12/22 0200 08/12/22 1640 08/13/22 0434  NA 138  --  137 135 138  --  138  K 3.6  --  3.5 4.4 4.2 4.3 4.1  CL 103  --  104 99 100  --  96*  CO2 24  --  '26 27 29  '$ --  28  GLUCOSE 173*  --  206* 192* 171*  --  123*  BUN 18  --  '16 16 18  '$ --  26*  CREATININE 0.63  --  0.62 0.62 0.68  --  0.73  CALCIUM 8.1*  --  8.0* 8.0* 8.4*  --  8.6*  MG 2.1 2.1 2.1 2.0 2.4 2.2 2.4  PHOS 1.9* 2.0* 1.9* 2.1* 2.8  --   --    Recent Labs  Lab 08/09/22 0458 08/11/22 0149 08/12/22 0200  AST '18 16 17  '$ ALT '11 11 12  '$ ALKPHOS 76 66 66  BILITOT 0.1* 0.4 0.6  PROT 5.7* 5.5* 5.8*  ALBUMIN 2.1* 2.0* 2.2*   Recent Labs  Lab 08/09/22 0458 08/10/22 0525 08/11/22 0149 08/12/22 0200 08/13/22 0434  WBC 65.0* 68.3* 44.1* 46.7* 48.4*   NEUTROABS 7.8* 9.6* 18.5* 19.1* 12.2*  HGB 12.6 11.9* 11.5* 12.1 11.8*  HCT 38.6 35.6* 34.1* 36.4 35.3*  MCV 98.5 97.5 95.8 96.0 96.2  PLT 221 241 226 308 341   No results for input(s): "CKTOTAL", "CKMB", "CKMBINDEX", "TROPONINI" in the last 168 hours. No results for input(s): "LABPROT", "INR" in the last 72 hours. No results for input(s): "COLORURINE", "LABSPEC", "PHURINE", "GLUCOSEU", "HGBUR", "BILIRUBINUR", "KETONESUR", "PROTEINUR", "UROBILINOGEN", "NITRITE", "LEUKOCYTESUR" in the last 72 hours.  Invalid input(s): "APPERANCEUR"      Component Value Date/Time   CHOL 127 08/03/2022 0905   TRIG 97 08/03/2022 0905   HDL 17 (L) 08/03/2022 0905   CHOLHDL 7.5 08/03/2022 0905   VLDL 19 08/03/2022 0905   LDLCALC 91 08/03/2022 0905   Lab Results  Component Value Date   HGBA1C 6.0 (H) 08/03/2022   No results found for: "LABOPIA", "COCAINSCRNUR", "LABBENZ", "AMPHETMU", "THCU", "LABBARB"  No results for input(s): "ETH" in the last 168 hours.  I have personally reviewed the radiological images below and agree with the radiology interpretations.  EEG adult  Result Date: 08/12/2022 Lora Havens, MD  08/12/2022  3:36 PM [name] MRN: [id number] Epilepsy Attending: Lora Havens Referring Physician/Provider: Elodia Florence., MD Date: 08/12/2022 Duration: 27.03 mins Patient history: 66yo F with ams. EEG to evaluate for seizure Level of alertness: Awake AEDs during EEG study: None Technical aspects: This EEG study was done with scalp electrodes positioned according to the 10-20 International system of electrode placement. Electrical activity was reviewed with band pass filter of 1-'70Hz'$ , sensitivity of 7 uV/mm, display speed of 60m/sec with a '60Hz'$  notched filter applied as appropriate. EEG data were recorded continuously and digitally stored.  Video monitoring was available and reviewed as appropriate. Description: EEG showed continuous generalized 3 to 6 Hz  theta-delta slowing. Hyperventilation and photic stimulation were not performed.   Of note, study was technically difficult due to significant chewing artifact. ABNORMALITY - Continuous slow, generalized IMPRESSION: This technically difficult study is suggestive of moderate diffuse encephalopathy, nonspecific etiology. No seizures or epileptiform discharges were seen throughout the recording. PLora Havens  DG CHEST PORT 1 VIEW  Result Date: 08/10/2022 CLINICAL DATA:  Cough. EXAM: PORTABLE CHEST 1 VIEW COMPARISON:  August 06, 2022. FINDINGS: Stable cardiomediastinal silhouette. Feeding tube is seen entering stomach. Bilateral pulmonary nodules are noted concerning for metastatic disease. Mild left basilar subsegmental atelectasis is noted with small left pleural effusion. Bony thorax is unremarkable. IMPRESSION: Bilateral pulmonary nodules are again noted concerning for metastatic disease. Mild left basilar subsegmental atelectasis is noted with small left pleural effusion. Electronically Signed   By: JMarijo ConceptionM.D.   On: 08/10/2022 12:38   DG Abd Portable 1V  Result Date: 08/08/2022 CLINICAL DATA:  Feeding tube placement EXAM: PORTABLE ABDOMEN - 1 VIEW COMPARISON:  CT abdomen 05/29/2006 FINDINGS: Partially visualized bilateral pulmonary nodules. Bilateral hilar lymphadenopathy. Feeding tube with the tip projecting over the antrum of the stomach. No bowel dilatation to suggest obstruction. No evidence of pneumoperitoneum, portal venous gas or pneumatosis. No pathologic calcifications along the expected course of the ureters. No acute osseous abnormality. IMPRESSION: 1. Feeding tube with the tip projecting over the antrum of the stomach. 2. Redemonstrated bilateral hilar lymphadenopathy and bilateral pulmonary nodules. Electronically Signed   By: HKathreen DevoidM.D.   On: 08/08/2022 13:00   EEG adult  Result Date: 08/07/2022 SDerek Jack MD     08/07/2022 12:34 PM Patient Name: SSHARAYAH RENFROWMRN: [id number] Attending: CSu MonksMD Referring Physician/Provider: KDonnetta Simpers MD Date: 08/07/2022 Duration: 24.09 min  Patient history: 66year old female with alteration in mental status. EEG to evaluate for seizure  Level of alertness:  lethargic  AEDs during EEG study: None  Technical aspects: This EEG study was done with scalp electrodes positioned according to the 10-20 International system of electrode placement. Electrical activity was reviewed with band pass filter of 1-'70Hz'$ , sensitivity of 7 uV/mm, display speed of 358msec with a '60Hz'$  notched filter applied as appropriate. EEG data were recorded continuously and digitally stored.  Video monitoring was available and reviewed as appropriate.  Description: EEG showed continuous generalized 3 to 6 Hz theta-delta slowing. Generalized periodic discharges with triphasic morphology at  1.5-'2Hz'$  were also noted, more prominent when awake/stimulated. Hyperventilation and photic stimulation were not performed.    ABNORMALITY - Periodic discharges with triphasic morphology, generalized ( GPDs) - Continuous slow, generalized  IMPRESSION: This study showed generalized periodic discharges with triphasic morphology which can be on the ictal-interictal continuum. However, the morphology, frequency and reactivity to stimulation is more commonly  indicative of toxic-metabolic causes. Additionally there is moderate to severe diffuse encephalopathy, nonspecific etiology. No seizures were seen throughout the recording. This EEG is unchanged compared to yesterday. Su Monks, MD [facility] [phone] If 7pm- 7am, please page neurology on call as listed in [place].   DG CHEST PORT 1 VIEW  Result Date: 08/06/2022 CLINICAL DATA:  Acute respiratory failure with hypoxia EXAM: PORTABLE CHEST 1 VIEW COMPARISON:  Chest x-ray 08/02/2022.  Chest CT 08/03/2022. FINDINGS: Bilateral pulmonary nodular densities are again noted and similar to  the prior study. There is increased central pulmonary vascular congestion. The cardiomediastinal silhouette appears stable. There is no pneumothorax or acute fracture. IMPRESSION: 1. Increased central pulmonary vascular congestion. 2. Stable bilateral pulmonary nodules. Electronically Signed   By: Ronney Asters M.D.   On: 08/06/2022 17:33   EEG adult  Result Date: 08/06/2022 Lora Havens, MD     08/06/2022  8:43 AM Patient Name: RACHEL RISON MRN: [id number] Epilepsy Attending: Lora Havens Referring Physician/Provider: Donnetta Simpers, MD Date: 08/06/2022 Duration: 23.12 mins Patient history: 66 year old female with alteration in mental status. EEG to evaluate for seizure Level of alertness:  lethargic AEDs during EEG study: None Technical aspects: This EEG study was done with scalp electrodes positioned according to the 10-20 International system of electrode placement. Electrical activity was reviewed with band pass filter of 1-'70Hz'$ , sensitivity of 7 uV/mm, display speed of 25m/sec with a '60Hz'$  notched filter applied as appropriate. EEG data were recorded continuously and digitally stored.  Video monitoring was available and reviewed as appropriate. Description: EEG showed continuous generalized 3 to 6 Hz theta-delta slowing. Generalized periodic discharges with triphasic morphology at  1.5-'2Hz'$  were also noted, more prominent when awake/stimulated. Hyperventilation and photic stimulation were not performed.   ABNORMALITY - Periodic discharges with triphasic morphology, generalized ( GPDs) - Continuous slow, generalized IMPRESSION: This study showed generalized periodic discharges with triphasic morphology which can be on the ictal-interictal continuum. However, the morphology, frequency and reactivity to stimulation is more commonly indicative of toxic-metabolic causes. Additionally there is moderate to severe diffuse encephalopathy, nonspecific etiology. No seizures were seen throughout the  recording. PLora Havens  MR BRAIN WO CONTRAST  Result Date: 08/05/2022 CLINICAL DATA:  History of stroke, mental status change EXAM: MRI HEAD WITHOUT CONTRAST TECHNIQUE: Multiplanar, multiecho pulse sequences of the brain and surrounding structures were obtained without intravenous contrast. COMPARISON:  CT head 1 day prior, brain MRI 08/02/2022 FINDINGS: Brain: Again seen are extensive acute infarcts throughout both cerebral and cerebellar hemispheres across bilateral ACA, MCA, and PCA distributions, overall increased in extent compared to the MRI from 3 days prior. There is associated FLAIR signal abnormality and a few punctate foci of SWI signal dropout suspicious for petechial hemorrhage but no organized hematoma or mass effect. The foci of intrinsic T1 hyperintensity in the occipital lobes may be due to petechial hemorrhage. There is no acute intracranial hemorrhage or extra-axial fluid collection Background parenchymal volume is normal. The ventricles are stable in size. There is no mass lesion. There is no mass effect or midline shift. Vascular: Normal flow voids. Skull and upper cervical spine: Normal marrow signal. Sinuses/Orbits: The paranasal sinuses are clear. The globes and orbits are unremarkable. Other: None. IMPRESSION: Extensive acute infarcts throughout both cerebral and cerebellar hemispheres across multiple vascular distributions again favored embolic with differential including watershed ischemia, increased in extent since the MRI from 3 days prior. Suspect some petechial hemorrhage but no hematoma or mass effect. Electronically Signed  By: Valetta Mole M.D.   On: 08/05/2022 19:45   ECHO TEE  Result Date: 08/05/2022    TRANSESOPHOGEAL ECHO REPORT   Patient Name:   TASIA LIZ Date of Exam: 08/05/2022 Medical Rec #:  [id number]     Height:       63.0 in Accession #:    [id number]    Weight:       160.0 lb Date of Birth:  April 27, 1956     BSA:          1.759 m Patient Age:    62  years      BP:           125/78 mmHg Patient Gender: F             HR:           99 bpm. Exam Location:  Inpatient Procedure: Transesophageal Echo, 3D Echo, Color Doppler, Cardiac Doppler and            Saline Contrast Bubble Study Indications:     Stroke i63.9  History:         Patient has prior history of Echocardiogram examinations, most                  recent 08/03/2022. Risk Factors:Hypertension, Diabetes and                  Dyslipidemia.  Sonographer:     Raquel Sarna Senior RDCS Referring Phys:  [id number] Margie Billet Diagnosing Phys: Oswaldo Milian MD PROCEDURE: After discussion of the risks and benefits of a TEE, an informed consent was obtained from the patient. The transesophogeal probe was passed without difficulty through the esophogus of the patient. Sedation performed by different physician. The patient was monitored while under deep sedation. Anesthestetic sedation was provided intravenously by Anesthesiology: '70mg'$  of Propofol. The patient developed no complications during the procedure. IMPRESSIONS  1. Left ventricular ejection fraction, by estimation, is 55 to 60%. The left ventricle has normal function.  2. Right ventricular systolic function is normal. The right ventricular size is normal. There is moderately elevated pulmonary artery systolic pressure.  3. No left atrial/left atrial appendage thrombus was detected.  4. The mitral valve is normal in structure. Mild mitral valve regurgitation.  5. The aortic valve is tricuspid. Aortic valve regurgitation is not visualized.  6. Evidence of atrial level shunting detected by color flow Doppler. Agitated saline contrast bubble study was positive with shunting observed within 3-6 cardiac cycles suggestive of interatrial shunt. Few bubbles seen in left atrium after patient coughed, suggesting small PFO. Small PFO confirmed visually with left to right shunting.  7. No vegetation seen FINDINGS  Left Ventricle: Left ventricular ejection fraction, by  estimation, is 55 to 60%. The left ventricle has normal function. The left ventricular internal cavity size was normal in size. Right Ventricle: The right ventricular size is normal. No increase in right ventricular wall thickness. Right ventricular systolic function is normal. There is moderately elevated pulmonary artery systolic pressure. The tricuspid regurgitant velocity is 3.35 m/s, and with an assumed right atrial pressure of 3 mmHg, the estimated right ventricular systolic pressure is 03.5 mmHg. Left Atrium: Left atrial size was normal in size. No left atrial/left atrial appendage thrombus was detected. Right Atrium: Right atrial size was normal in size. Pericardium: Trivial pericardial effusion is present. Mitral Valve: The mitral valve is normal in structure. Mild mitral valve regurgitation. Tricuspid Valve: The tricuspid valve is normal in structure.  Tricuspid valve regurgitation is mild. Aortic Valve: The aortic valve is tricuspid. Aortic valve regurgitation is not visualized. Aortic valve mean gradient measures 6.0 mmHg. Aortic valve peak gradient measures 10.2 mmHg. Pulmonic Valve: The pulmonic valve was grossly normal. Pulmonic valve regurgitation is not visualized. Aorta: The aortic root is normal in size and structure. IAS/Shunts: Evidence of atrial level shunting detected by color flow Doppler. Agitated saline contrast was given intravenously to evaluate for intracardiac shunting. Agitated saline contrast bubble study was positive with shunting observed within 3-6 cardiac cycles suggestive of interatrial shunt.  AORTIC VALVE AV Vmax:           160.00 cm/s AV Vmean:          112.000 cm/s AV VTI:            0.244 m AV Peak Grad:      10.2 mmHg AV Mean Grad:      6.0 mmHg LVOT Vmax:         115.00 cm/s LVOT Vmean:        83.500 cm/s LVOT VTI:          0.210 m LVOT/AV VTI ratio: 0.86 TRICUSPID VALVE TR Peak grad:   44.9 mmHg TR Vmax:        335.00 cm/s  SHUNTS Systemic VTI: 0.21 m Oswaldo Milian  MD Electronically signed by Oswaldo Milian MD Signature Date/Time: 08/05/2022/4:04:01 PM    Final    CT VENOGRAM HEAD  Result Date: 08/04/2022 CLINICAL DATA:  Dural venous sinus thrombosis suspected. EXAM: CT VENOGRAM HEAD TECHNIQUE: Venographic phase images of the brain were obtained following the administration of intravenous contrast. Multiplanar reformats and maximum intensity projections were generated. RADIATION DOSE REDUCTION: This exam was performed according to the departmental dose-optimization program which includes automated exposure control, adjustment of the mA and/or kV according to patient size and/or use of iterative reconstruction technique. CONTRAST:  62m OMNIPAQUE IOHEXOL 350 MG/ML SOLN COMPARISON:  Head and neck CTA 08/04/2022.  Head MRI 08/02/2022. FINDINGS: The recent MRI demonstrated numerous acute infarcts involving both cerebral hemispheres and cerebellum, the majority of which are occult by CT. No acute intracranial hemorrhage, midline shift, or extra-axial fluid collection is identified. The ventricles are normal in size. No fracture or suspicious osseous lesion is identified. There is minimal mucosal thickening in the left sphenoid sinus. The mastoid air cells are clear. The orbits are unremarkable. CT venogram images are motion degraded, however the superior sagittal sinus, internal cerebral veins, vein of Galen, straight sinus, and sigmoid sinuses are enhancing without evidence of thrombosis. The transverse sinuses are small but grossly patent. A small filling defect in the posterior aspect of the superior sagittal sinus is compatible with an arachnoid granulation. IMPRESSION: No evidence of dural venous sinus thrombosis. Electronically Signed   By: ALogan BoresM.D.   On: 08/04/2022 16:45   CT ANGIO HEAD NECK W WO CM  Addendum Date: 08/04/2022   ADDENDUM REPORT: 08/04/2022 11:52 ADDENDUM: Findings were discussed with Dr. VEliseo Squireson 08/04/22 at 11:50 AM. Electronically  Signed   By: HMarin RobertsM.D.   On: 08/04/2022 11:52   Result Date: 08/04/2022 CLINICAL DATA:  Stroke follow-up EXAM: CT ANGIOGRAPHY HEAD AND NECK TECHNIQUE: Multidetector CT imaging of the head and neck was performed using the standard protocol during bolus administration of intravenous contrast. Multiplanar CT image reconstructions and MIPs were obtained to evaluate the vascular anatomy. Carotid stenosis measurements (when applicable) are obtained utilizing NASCET criteria, using the distal internal carotid diameter  as the denominator. RADIATION DOSE REDUCTION: This exam was performed according to the departmental dose-optimization program which includes automated exposure control, adjustment of the mA and/or kV according to patient size and/or use of iterative reconstruction technique. CONTRAST:  70m OMNIPAQUE IOHEXOL 350 MG/ML SOLN COMPARISON:  MRI Head 08/02/22 FINDINGS: CT HEAD FINDINGS Brain: No evidence of hemorrhage, hydrocephalus, extra-axial collection or mass lesion/mass effect. 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 Vascular: See below Skull: Normal. Negative for fracture or focal lesion. Sinuses/Orbits: No acute finding. Other: None. Review of the MIP images confirms the above findings CTA NECK FINDINGS Aortic arch: Standard branching. Imaged portion shows no evidence of aneurysm or dissection. No significant stenosis of the major arch vessel origins. Right carotid system: No evidence of dissection, stenosis (50% or greater), or occlusion. Left carotid system: No evidence of dissection, stenosis (50% or greater), or occlusion. Vertebral arteries: Codominant. No evidence of dissection, stenosis (50% or greater), or occlusion. Skeleton: Negative. Other neck: Asymmetrically enlarged right level 5 lymph node measuring up to 6 mm (series  9, image 122). Additional enlarged right level 5 lymph node is present more superiorly measuring 9 mm (series 9, image 106). Enlarged 7 mm left level 5 lymph node is also present (series 9, image 115). 9 mm right level 2A lymph node (series 9, image 94). There is soft tissue stranding along the posterior right suboccipital neck (series 9, image 87), nonspecific. Upper chest: Small bilateral pleural effusions. There is a 1.2 cm right upper lobe pulmonary nodule, better assessed on prior CT chest dated 08/04/2019. There also multiple enlarged mediastinal lymph nodes and left axillary lymph node. Review of the MIP images confirms the above findings CTA HEAD FINDINGS Anterior circulation: No significant stenosis, proximal occlusion, aneurysm, or vascular malformation. Posterior circulation: No significant stenosis, proximal occlusion, aneurysm, or vascular malformation. Venous sinuses: There is a congenitally small left transverse and sigmoid sinus. There is decreased opacification of the right-sided transverse and sigmoid sinus (series 11, image 122). This area of decreased opacification extends proximally to involve the superior sagittal sinus hazy month Anatomic variants: None Review of the MIP images confirms the above findings IMPRESSION: 1. Asymmetrically decreased opacification of the right-sided transverse and sigmoid sinus is worrisome for dural venous sinus thrombosis. Recommend further evaluation with a CT venogram. 2. No intracranial LVO or significant arterial stenosis in the neck. 3. No acute intracranial process. 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 4. Multiple enlarged lymph nodes in the neck, mediastinum, and left axilla, which are compatible with clinically suspected lymphoma. 5. Partially imaged pleural effusions and right sided  pulmonary nodule, better seen on prior CTA chest. Electronically Signed: By: HMarin RobertsM.D. On: 08/04/2022 11:17   VAS [place]OWER EXTREMITY VENOUS (DVT)  Result Date: 08/03/2022  Lower Venous DVT Study Patient Name:  SKENDYL FESTA Date of Exam:   08/03/2022 Medical Rec #: [id number]     Accession #:    [id number] of Birth: 404-27-1957     Patient Gender: F Patient Age:   89years [facility]      [facility]AS [place] EXTREMITY VENOUS (DVT) Referring Phys: TIMOTHY OPYD --------------------------------------------------------------------------------  Indications: Elevated d-dimer, lymphoma.  Comparison       06-15-2021 Prior left lower extremity venous was negative for Study:           DVT. Performing Technologist: Darlin Coco RDMS, RVT  Examination Guidelines: A complete evaluation includes B-mode imaging, spectral Doppler, color Doppler, and power Doppler as needed of all accessible portions of each vessel. Bilateral testing is considered an integral part of a complete examination. Limited examinations for reoccurring indications may be performed as noted. The reflux portion of the exam is performed with the patient in reverse Trendelenburg.  +---------+---------------+---------+-----------+----------+--------------+ RIGHT    CompressibilityPhasicitySpontaneityPropertiesThrombus Aging +---------+---------------+---------+-----------+----------+--------------+ CFV      Full           Yes      Yes                                 +---------+---------------+---------+-----------+----------+--------------+ SFJ      Full                                                        +---------+---------------+---------+-----------+----------+--------------+ FV Prox  Full                                                        +---------+---------------+---------+-----------+----------+--------------+ FV Mid   Full                                                         +---------+---------------+---------+-----------+----------+--------------+ FV DistalFull                                                        +---------+---------------+---------+-----------+----------+--------------+ PFV      Full                                                        +---------+---------------+---------+-----------+----------+--------------+ POP      Full           Yes      Yes                                 +---------+---------------+---------+-----------+----------+--------------+ PTV      Full                                                        +---------+---------------+---------+-----------+----------+--------------+ PERO     Full                                                        +---------+---------------+---------+-----------+----------+--------------+  Gastroc  Full                                                        +---------+---------------+---------+-----------+----------+--------------+   +---------+---------------+---------+-----------+----------+--------------+ LEFT     CompressibilityPhasicitySpontaneityPropertiesThrombus Aging +---------+---------------+---------+-----------+----------+--------------+ CFV      Full           Yes      Yes                                 +---------+---------------+---------+-----------+----------+--------------+ SFJ      Full                                                        +---------+---------------+---------+-----------+----------+--------------+ FV Prox  Full                                                        +---------+---------------+---------+-----------+----------+--------------+ FV Mid   Full                                                        +---------+---------------+---------+-----------+----------+--------------+ FV DistalFull                                                         +---------+---------------+---------+-----------+----------+--------------+ PFV      Full                                                        +---------+---------------+---------+-----------+----------+--------------+ POP      Full           Yes      Yes                                 +---------+---------------+---------+-----------+----------+--------------+ PTV      Full                                                        +---------+---------------+---------+-----------+----------+--------------+ PERO     Partial        No       No                   Acute          +---------+---------------+---------+-----------+----------+--------------+  Gastroc  Full                                                        +---------+---------------+---------+-----------+----------+--------------+     Summary: RIGHT: - There is no evidence of deep vein thrombosis in the lower extremity.  - No cystic structure found in the popliteal fossa.  - Ultrasound characteristics of enlarged lymph nodes are noted in the groin.  LEFT: - Findings consistent with acute deep vein thrombosis involving the left peroneal veins.  - No cystic structure found in the popliteal fossa.  - Ultrasound characteristics of enlarged lymph nodes noted in the groin.  *See table(s) above for measurements and observations. Electronically signed by Harold Barban MD on 08/03/2022 at 9:30:05 PM.    Final    ECHOCARDIOGRAM COMPLETE  Result Date: 08/03/2022    ECHOCARDIOGRAM REPORT   Patient Name:   BAYLOR TEEGARDEN Date of Exam: 08/03/2022 Medical Rec #:  [id number]     Height:       63.0 in Accession #:    [id number]    Weight:       160.0 lb Date of Birth:  [birth year]     BSA:          1.759 m Patient Age:    80 years      BP:           152/94 mmHg Patient Gender: F             HR:           110 bpm. Exam Location:  Inpatient Procedure: 2D Echo Indications:    stroke  History:        Patient has prior history of  Echocardiogram examinations and                 Patient has no prior history of Echocardiogram examinations.                 Sepsis; Risk Factors:Hypertension.  Sonographer:    Johny Chess RDCS Referring Phys: [id number] RIPUDEEP K RAI  Sonographer Comments: Image acquisition challenging due to uncooperative patient. IMPRESSIONS  1. Left ventricular ejection fraction, by estimation, is 60 to 65%. The left ventricle has normal function. The left ventricle has no regional wall motion abnormalities. There is mild concentric left ventricular hypertrophy. Left ventricular diastolic parameters are consistent with Grade I diastolic dysfunction (impaired relaxation).  2. Right ventricular systolic function is normal. The right ventricular size is normal. Tricuspid regurgitation signal is inadequate for assessing PA pressure.  3. The mitral valve is normal in structure. No evidence of mitral valve regurgitation. No evidence of mitral stenosis.  4. The aortic valve is tricuspid. There is mild calcification of the aortic valve. Aortic valve regurgitation is not visualized. No aortic stenosis is present.  5. The inferior vena cava is normal in size with greater than 50% respiratory variability, suggesting right atrial pressure of 3 mmHg. FINDINGS  Left Ventricle: Left ventricular ejection fraction, by estimation, is 60 to 65%. The left ventricle has normal function. The left ventricle has no regional wall motion abnormalities. The left ventricular internal cavity size was normal in size. There is  mild concentric left ventricular hypertrophy. Left ventricular diastolic parameters are consistent with Grade I diastolic dysfunction (impaired relaxation). Right Ventricle: The right ventricular  size is normal. No increase in right ventricular wall thickness. Right ventricular systolic function is normal. Tricuspid regurgitation signal is inadequate for assessing PA pressure. Left Atrium: Left atrial size was normal in size. Right  Atrium: Right atrial size was normal in size. Pericardium: There is no evidence of pericardial effusion. Mitral Valve: The mitral valve is normal in structure. There is mild calcification of the mitral valve leaflet(s). Mild mitral annular calcification. No evidence of mitral valve regurgitation. No evidence of mitral valve stenosis. Tricuspid Valve: The tricuspid valve is normal in structure. Tricuspid valve regurgitation is not demonstrated. Aortic Valve: The aortic valve is tricuspid. There is mild calcification of the aortic valve. Aortic valve regurgitation is not visualized. No aortic stenosis is present. Pulmonic Valve: The pulmonic valve was normal in structure. Pulmonic valve regurgitation is not visualized. Aorta: The aortic root is normal in size and structure. Venous: The inferior vena cava is normal in size with greater than 50% respiratory variability, suggesting right atrial pressure of 3 mmHg. IAS/Shunts: No atrial level shunt detected by color flow Doppler.  LEFT VENTRICLE PLAX 2D LVIDd:         4.20 cm LVIDs:         2.40 cm LV PW:         0.90 cm LV IVS:        1.20 cm LVOT diam:     1.60 cm LV SV:         48 LV SV Index:   27 LVOT Area:     2.01 cm  RIGHT VENTRICLE             IVC RV S prime:     20.30 cm/s  IVC diam: 1.30 cm TAPSE (M-mode): 1.6 cm LEFT ATRIUM             Index        RIGHT ATRIUM          Index LA diam:        3.40 cm 1.93 cm/m   RA Area:     9.64 cm LA Vol (A2C):   26.3 ml 14.95 ml/m  RA Volume:   19.10 ml 10.86 ml/m LA Vol (A4C):   27.5 ml 15.64 ml/m LA Biplane Vol: 27.9 ml 15.86 ml/m  AORTIC VALVE LVOT Vmax:   152.00 cm/s LVOT Vmean:  105.000 cm/s LVOT VTI:    0.238 m  AORTA Ao Root diam: 3.00 cm MV E velocity: 79.60 cm/s MV A velocity: 141.00 cm/s  SHUNTS MV E/A ratio:  0.56         Systemic VTI:  0.24 m                             Systemic Diam: 1.60 cm Dalton McleanMD Electronically signed by Franki Monte Signature Date/Time: 08/03/2022/2:41:22 PM    Final     CT Angio Chest Pulmonary Embolism (PE) W or WO Contrast  Result Date: 08/03/2022 CLINICAL DATA:  Pulmonary embolism suspected, positive D-dimer. Undergoing malignancy workup with recent biopsy suggesting lymphoma. EXAM: CT ANGIOGRAPHY CHEST WITH CONTRAST TECHNIQUE: Multidetector CT imaging of the chest was performed using the standard protocol during bolus administration of intravenous contrast. Multiplanar CT image reconstructions and MIPs were obtained to evaluate the vascular anatomy. RADIATION DOSE REDUCTION: This exam was performed according to the departmental dose-optimization program which includes automated exposure control, adjustment of the mA and/or kV according to patient size and/or use of  iterative reconstruction technique. CONTRAST:  66m OMNIPAQUE IOHEXOL 350 MG/ML SOLN COMPARISON:  None Available. FINDINGS: Cardiovascular: The heart is borderline enlarged. There is no pericardial effusion. Scattered coronary artery calcifications are noted. There is atherosclerotic calcification of the aorta without evidence of aneurysm. Pulmonary trunk is normal in caliber. No pulmonary artery filling defect is seen. Examination is limited due to respiratory motion artifact and pulmonary nodules. Mediastinum/Nodes: Multiple enlarged lymph nodes are present in the mediastinum and hilar regions bilaterally. No axillary lymphadenopathy. Thyroid gland, trachea, and esophagus are within normal limits. Lungs/Pleura: Hazy regional ground-glass opacities are present in the lungs bilaterally. There are multiple scattered pulmonary nodules bilaterally, the largest in the right lower lobe measuring 3.7 cm, axial image 110 and in the left lower lobe measuring 4.1 cm, axial image 94. No effusion or pneumothorax. Upper Abdomen: Enlarged lymph nodes are present in the gastrohepatic ligament measuring up to 1.1 cm. No acute abnormality. Musculoskeletal: No acute or suspicious osseous abnormality. Review of the MIP images  confirms the above findings. IMPRESSION: 1. No evidence of pulmonary embolism. 2. Multiple enlarged mediastinal and hilar lymph nodes and pulmonary nodules bilaterally measuring up to 4.1 cm, concerning for neoplastic process or lymphoma. Correlation with recent biopsy results is recommended. 3. Mild hazy geographic ground-glass opacities in the lungs, possible edema or air trapping. 4. Coronary artery calcifications. 5. Aortic atherosclerosis. Electronically Signed   By: LBrett FairyM.D.   On: 08/03/2022 01:10   MR BRAIN WO CONTRAST  Result Date: 08/02/2022 CLINICAL DATA:  Altered mental status EXAM: MRI HEAD WITHOUT CONTRAST TECHNIQUE: Multiplanar, multiecho pulse sequences of the brain and surrounding structures were obtained without intravenous contrast. COMPARISON:  04/15/2013 FINDINGS: Brain: Numerous, diffuse bilateral foci of abnormal diffusion restriction in a pattern that is most suggestive of embolic shower but could also indicate watershed ischemia. No acute hemorrhage. There is multifocal hyperintense T2-weighted signal within the white matter. Generalized volume loss. The midline structures are normal. Vascular: Major flow voids are preserved. Skull and upper cervical spine: Normal calvarium and skull base. Visualized upper cervical spine and soft tissues are normal. Sinuses/Orbits:No paranasal sinus fluid levels or advanced mucosal thickening. No mastoid or middle ear effusion. Normal orbits. IMPRESSION: 1. Numerous, diffuse bilateral foci of abnormal diffusion restriction in a pattern that is most suggestive of embolic shower but could also indicate watershed ischemia. 2. No acute hemorrhage or mass effect. Electronically Signed   By: KUlyses JarredM.D.   On: 08/02/2022 23:57   CT Head Wo Contrast  Result Date: 08/02/2022 CLINICAL DATA:  Altered mental status, recent diagnosis of lymphoma EXAM: CT HEAD WITHOUT CONTRAST TECHNIQUE: Contiguous axial images were obtained from the base of the  skull through the vertex without intravenous contrast. RADIATION DOSE REDUCTION: This exam was performed according to the departmental dose-optimization program which includes automated exposure control, adjustment of the mA and/or kV according to patient size and/or use of iterative reconstruction technique. COMPARISON:  MR brain done on 04/15/2013 FINDINGS: Brain: No acute intracranial findings are seen in noncontrast CT brain. There are no signs of bleeding within the cranium. Ventricles are not dilated. Cortical sulci are prominent. There is no focal edema or mass effect. Vascular: Unremarkable Skull: Unremarkable. Sinuses/Orbits: Unremarkable. Other: None. IMPRESSION: No acute intracranial findings are seen in noncontrast CT brain. Atrophy. Electronically Signed   By: PElmer PickerM.D.   On: 08/02/2022 16:35   DG Chest 2 View  Result Date: 08/02/2022 CLINICAL DATA:  Cough, altered mental status x3 days  EXAM: CHEST - 2 VIEW COMPARISON:  04/21/2021 FINDINGS: Cardiac size is within normal limits. Thoracic aorta is tortuous. There are nodular densities of varying sizes scattered throughout both lungs largest overlying the inferior right hilum measuring 2.8 cm. Findings suggest possible metastatic disease. Less likely possibility would be nodular appearing multifocal pneumonia. There is no pleural effusion or pneumothorax. Degenerative changes are noted in both shoulders. IMPRESSION: There are multiple nodular densities of varying sizes in both lungs suggesting pulmonary metastatic disease. Follow-up CT, PET-CT and biopsy as warranted should be considered. Please correlate with clinical history for any known primary malignancy. Electronically Signed   By: Elmer Picker M.D.   On: 08/02/2022 15:37     PHYSICAL EXAM  Temp:  [98 F (36.7 C)-98.8 F (37.1 C)] 98.8 F (37.1 C) (10/21 0743) Pulse Rate:  [78-94] 94 (10/21 1139) Resp:  [17-19] 18 (10/21 0743) BP: (116-129)/(63-84) 116/63 (10/21  0743) SpO2:  [91 %-95 %] 91 % (10/21 0743) Weight:  [79.4 kg] 79.4 kg (10/21 0456)  General - Well nourished, well developed, not in acute distress  Ophthalmologic - fundi not visualized due to noncooperation.  Cardiovascular - Regular rhythm and rate  Neuro - eyes closed, lethargic, but will open eyes to command., not blinking to visual threat bilaterally. She speaks occasional few words which are difficult to understand.  y. Did not answer questions. Seems able to follow commands on foot movement, but not follow midline commands. No facial droop. Tongue protrusion not cooperative. No movement to pain in UEs and 2/5 knee extension LEs but not able to do knee flexion per request, b/l toe movement 4/5. Sensation, coordination and gait not tested. continuous fine choreiform movements of both lower extremities  ASSESSMENT/PLAN Ms. ATHINA FAHEY is a 66 y.o. female with history of recent diagnosed lymphoma, recent MRSA infection admitted for altered mental status, lethargy and not talking. No tPA given due to outside window.    Stroke:  bilateral embolic shower, etiology not quite clear, however could be due to intravascular lymphoma versus distal DVT in the setting of small PFO or viscosity of blood from lymphoma with significant leukocytosis    CT no acute abnormality MRI 27/78 embolic shower bilaterally CTA chest no PE CT head and neck and CTV unremarkable MRI 10/13 extension of embolic shower bilaterally 2D Echo EF 60 to 65% LE venous Doppler left peroneal vein DVT TEE showed small PFO LDL 91 HgbA1c 6.0 Ammonia level normal Heparin IV for VTE prophylaxis No antithrombotic prior to admission, now on heparin IV.  On amantadine 200 bid for arousal  Ongoing aggressive stroke risk factor management Therapy recommendations: CIR Disposition: Pending, palliative care on board, prognosis guarding but likely poor  Hypertension Stable Long term BP goal normotensive  Hyperlipidemia Home  meds: Zetia LDL 91, goal < 70 Intolerance to statin Continue zetia at discharge  Fever and cough, resolved Tmax 101.4->afebrile->99.8-> afebrile Intermittent coughing, improved Blood culture negative, urine culture showed pansensitive E. Coli Off Rocephin, Flagyl and vancomycin now CXR Mild left basilar subsegmental atelectasis is noted with small left pleural effusion S/p BiPAP 10/19 CCM on board  ? OSA Daughter reported that 10/19 after overnight BiPAP, patient doing well, able to say " I love you to" " I guess" " I cannot wait".  However 10/19 night she did not receive BiPAP, and 10/20 patient was lethargic, drowsy and only speak single words.   Per daughter, patient at home does have snoring and intermittent apnea during sleep but never formally  diagnosed with OSA. Discussed with Dr. Florene Glen about trial of BiPAP or CPAP at night. Pt can not remove mask which may not qualify but daughter willing to stay at night to watch and help.   Dysphagia Did not pass swallow n.p.o. now Speech on board cortrak placed for nutrition  PFO LE DVT Small PFO on TEE LE venous Doppler left peroneal vein DVT On heparin IV  Lymphoma, untreated Recent diagnosis at [facility] Dr. Lorenso Courier on board WBC 48.8->48.2->46.3->55.1->63.8->64.6->65.0->68.3->44.1->46.7 Viscosity of the blood can be part of the stroke etiology  Other Stroke Risk Factors Advanced age  Other Active Problems Recent MRSA infection, completed vancomycin course  Hospital day # 10  I had long discussion with daughter and patient's husband at bedside, updated pt current condition, treatment plan and potential prognosis, and answered all the questions.  They expressed understanding and appreciation.  Family is leaning towards full palliative care measures but they need a few more days to process this.  Discussed with palliative care nurse practitioner I also discussed with Dr. Florene Glen.  Greater than 50% time during this 35-minute  visit was spent on counseling and coordination of care and discussion patient and care team and answering questions. Stroke team will be available kindly call for questions if any.  Stroke team will sign off. Antony Contras MD  Stroke Neurology 08/13/2022 1:41 PM     To contact Stroke Continuity provider, please refer to [email or web address] After hours, contact General Neurology

## 2022-08-13 NOTE — Progress Notes (Signed)
Palliative Medicine Inpatient Follow Up Note HPI: 66 y.o. female  with past medical history of HTN, HLD, T2DM, anemia, arthritis, remote history of melanoma, and recent diagnosis of T-cell lymphoma admitted on 08/02/2022 with altered mental status.  MRI brain was positive for scattered cortical and subcortical infarcts.  Patient now with acute hypoxic respiratory failure requiring BiPAP.  Respiratory status improved with IV Lasix.  Patient with ongoing acute metabolic encephalopathy.  She occasionally verbalizes a few words.  She is unable to move her upper extremities.  Patient requiring core track for artificial nutrition.  Chest x-ray 10/18 with bilateral pulmonary nodules concerning for metastatic disease.  PMT consulted to discuss goals of care.  Today's Discussion 08/13/2022  *Please note that this is a verbal dictation therefore any spelling or grammatical errors are due to the "[employer]" system interpretation.  Chart reviewed inclusive of vital signs, progress notes, laboratory results, and diagnostic images.   I met with Sarah Walsh, her son Sarah Walsh and her daughter, Sarah Walsh at bedside this late morning.   We reviewed that Sarah Walsh has been intermittently awake and more verbally responsive though remains similar to yesterday. Patients family vocalize the need to give her more time to see if any improvements can be made. Acknowledged the difficulties they are experiencing. Discussed her B-Cell lymphoma and how all of that weighs into the equation of what the short and long term may look like on top of patients infarctions.   Patients family request for bipap to be continued at night for support.  Discussed hospice care. I described hospice as a service for patients who have a life expectancy of 6 months or less. The goal of hospice is the preservation of dignity and quality at the end phases of life. Under hospice care, the focus changes from curative to symptom relief.   Reviewed the  difference between inpatient and outpatient hospice.  Offered family the opportunity to reflect on Comfort's life and the type of woman she is. Provided support through therapeutic listening.   Summarized conversation of allowing some more time (2-3 days) and if not improvements are mad that family is open to additional conversations as they relate to keeping her comfortable.  Questions and concerns addressed/Palliative Support Provided.   Objective Assessment: Vital Signs Vitals:   08/13/22 1139 08/13/22 1700  BP:  129/75  Pulse: 94 89  Resp:  (!) 22  Temp:  98.8 F (37.1 C)  SpO2:  92%    Intake/Output Summary (Last 24 hours) at 08/13/2022 1737 Last data filed at 08/13/2022 1300 Gross per 24 hour  Intake 1114.55 ml  Output 1800 ml  Net -685.45 ml    Last Weight  Most recent update: 08/13/2022  4:57 AM    Weight  79.4 kg (175 lb 0.7 oz)            Gen: Elderly Caucasian F in NAD HEENT: moist mucous membranes CV: Regular rate and rhythm  PULM:  On 4LPM [place], deep abdominal breathing present ABD: soft/nontender  EXT: No edema  Neuro: Somnolent, minimally responsive  SUMMARY OF RECOMMENDATIONS   DNAR/DNI - Bipap is okay  Allow time for outcomes --> Family has requested three additional days to see if Sarah Walsh makes any improvements  Ongoing Palliative Support  Billing based on MDM: High ______________________________________________________________________________________ Lamarr Lulas Connell Palliative Medicine Team Team Cell Phone: [phone] Please utilize secure chat with additional questions, if there is no response within 30 minutes please call the above phone number  Palliative  Medicine Team providers are available by phone from 7am to 7pm daily and can be reached through the team cell phone.  Should this patient require assistance outside of these hours, please call the patient's attending physician.

## 2022-08-13 NOTE — Progress Notes (Addendum)
PROGRESS NOTE    Sarah Walsh  LFY:[id number] DOB: 04/22/1956 DOA: 08/02/2022 PCP: Sueanne Margarita, DO  Chief Complaint  Patient presents with   Altered Mental Status    Brief Narrative:  Sarah Walsh is Sarah Walsh 66 y.o. female with Marylynne Keelin history of T2DM, HLD, HTN, recent cervical LN biopsy with +lymphoma reportedly T cell, not yet started on treatment, presented to ED 10/10 with lethargy, confusion, anorexia, gait instability. MRI brain was positive for scattered cortical and subcortical infarcts. She was admitted, also noted to have fevers, WBC 85.2D with eosinophilic predominance.    Repeat MRI due to worsening level of consciousness showed extension of embolic shower strokes. EEG nonspecific without seizure recorded. Broad IV antibiotics have been continues empirically though no nidus of infection has been identified. Goals of care discussions are ongoing. Palliative care is consulted to assist.   Assessment & Plan:   Principal Problem:   Sepsis ([place]) Active Problems:   Essential hypertension   Encephalopathy acute   Lymphoma ([place])   DVT (deep venous thrombosis) (H[facility])   Acute metabolic encephalopathy   Acute CVA (cerebrovascular accident) ([place])   Malnutrition of moderate degree   Acute respiratory failure with hypoxia (H[facility])   Assessment and Plan: Seizure Like Activity EEG without seizures or epileptiform discharges Appreciate additional neuro recs  Acute Hypoxic Respiratory Failure Increased wob today with diminished breath sounds, expiratory wheezing Prior CT showed no PE (10/11 imaging), but did show multiple enlarged mediastinal and hilar LN's and pulm nodules measuring up to 4.1 CM as well as mild hazy geographic ggo's in the lungs, possible edema or air trapping CXR 10/18 with bilateral pulm nodules concerning for metastatic disease No true fevers, leukocytosis related to lymphoma Required bipap 10/18 PCCM consulted, now signed off.   Will continue lasix, steroids completed.   Can use bipap as needed for wob.  Low threshold for repeat imaging, restarting abx.  Sepsis: Patient met sepsis criteria on admission with tachycardia, fevers, leukocytosis, lactic acidosis, source unclear.   - afebrile at this point, leukocytosis related to lymphoma, low threshold to restart abx in setting of above, but lower suspicion for infectious cause at this point - bcx NGTD from 10/10. DDx fever includes due to lymphoma, central, less likely DVT in and of itself. - With negative cultures, completed ceftriaxone 2g q24h, vancomycin, flagyl x7 days (10/16). - Fungitell negative - Scheduled tylenol - Fever curve improved   Acute metabolic encephalopathy: Most likely related to widespread embolic CVAs.  - EEG with nonspecific changes without seizure activity noted initially. EEG repeated and unchanged 10/15. Anticipate some waxing/waning.  - Amantadine started per neurology for arousal - Limited neurological recovery at this time. Appreciate neurology recommendations.   Extensive bilateral cortical and subcortical ischemic CVAs: MRI brain showed numerous diffuse bilateral foci of abnormal diffusion restriction in Emilly Lavey pattern most suggestive of embolic shower but could also indicate watershed ischemia.  - repeat MRI 10/13 showed extensive acute infarcts throughout both cerebral and cerebellar hemispheres across multiple vascular distributions again favored embolic (with ddx including watershed ischemia) - increased in extent from MRI done previously  - PFO found on TEE. In conjunction with know DVT (albeit distal) and embolic distribution, started heparin, goal level 0.3-0.5.  - Neuro checks to continue - Cortrak inserted 10/16 - tube feeds have been resumed - We are pursing palliative care discussions with the family daily. They are very involved and have Maty Zeisler realistic understanding of expected poor prognosis, though it is ultimate status is yet  to be determined. Will consult palliative for  ongoing discussions.     Dark urine: Due to bilirubinuria due to lymphoma. No WBCs on repeat UA.    Metabolic acidosis: No anion gap. Resolved.   Hypokalemia:  - improved   PSVT: Burden decreased on repeat tele review today.  - Convert IV to enteral metoprolol. BP tolerating.   HTN:  - BP goal per neurology, prn hydralazine ordered   Acute RLE peroneal DVT - CTA negative for PE.  - Heparin as discussed above.  - Interestingly, per Dr. Hadley Pen discussion with heme/onc, Dr. Lorenso Courier, lymphomas are not classically associated with hypercoagulability. The severity of leukocytosis is also not near the expected threshold to cause leukostasis.    T-cell lymphoma: Recent diagnosis, has not been started on treatment yet.   - Oncology, Dr. Lorenso Courier consulted. Of course the patient is not currently Erielle Gawronski candidate for treatment and, given her normal cell counts otherwise, doubt there'd be any benefit to pulsed steroids at this time. There would certainly be risks as well. The family is aware of this discussion. - There is no further work up or treatment currently planned as an inpatient for this issue.    Eosinophilic leukocytosis:  - suspect this is related to her T cell lymphoma. Note subtypes of T cell lymphomas can aberrantly express IL-5.  - Work up has not suggested nephritis, hepatitis, She has no history of this prior to diagnosis, no ongoing drug/allergic reaction, no parasitic infection detected. Could be expected at the time of her drug eruption after Tx for MRSA recently though that agent was stopped and rash has resolved durably. HIV NR - Will plan to monitor CBC w/differential.  Eos fluctuating.     DVT prophylaxis: heparin Code Status: DNR after conversation with palliative 10/19 Family Communication: daughter Disposition:   Status is: Inpatient Remains inpatient appropriate because: continued need for inpatient care   Consultants:  PCCM Oncology Neurology  Procedures:   TEE IMPRESSIONS     1. Left ventricular ejection fraction, by estimation, is 55 to 60%. The  left ventricle has normal function.   2. Right ventricular systolic function is normal. The right ventricular  size is normal. There is moderately elevated pulmonary artery systolic  pressure.   3. No left atrial/left atrial appendage thrombus was detected.   4. The mitral valve is normal in structure. Mild mitral valve  regurgitation.   5. The aortic valve is tricuspid. Aortic valve regurgitation is not  visualized.   6. Evidence of atrial level shunting detected by color flow Doppler.  Agitated saline contrast bubble study was positive with shunting observed  within 3-6 cardiac cycles suggestive of interatrial shunt. Few bubbles  seen in left atrium after patient  coughed, suggesting small PFO. Small PFO confirmed visually with left to  right shunting.   7. No vegetation seen   Echo IMPRESSIONS     1. Left ventricular ejection fraction, by estimation, is 60 to 65%. The  left ventricle has normal function. The left ventricle has no regional  wall motion abnormalities. There is mild concentric left ventricular  hypertrophy. Left ventricular diastolic  parameters are consistent with Grade I diastolic dysfunction (impaired  relaxation).   2. Right ventricular systolic function is normal. The right ventricular  size is normal. Tricuspid regurgitation signal is inadequate for assessing  PA pressure.   3. The mitral valve is normal in structure. No evidence of mitral valve  regurgitation. No evidence of mitral stenosis.   4. The  aortic valve is tricuspid. There is mild calcification of the  aortic valve. Aortic valve regurgitation is not visualized. No aortic  stenosis is present.   5. The inferior vena cava is normal in size with greater than 50%  respiratory variability, suggesting right atrial pressure of 3 mmHg.  LE [place] Summary:  RIGHT:  - There is no evidence of deep vein  thrombosis in the lower extremity.     - No cystic structure found in the popliteal fossa.     - Ultrasound characteristics of enlarged lymph nodes are noted in the  groin.     LEFT:  - Findings consistent with acute deep vein thrombosis involving the left  peroneal veins.     - No cystic structure found in the popliteal fossa.     - Ultrasound characteristics of enlarged lymph nodes noted in the groin.      Antimicrobials:  Anti-infectives (From admission, onward)    Start     Dose/Rate Route Frequency Ordered Stop   08/08/22 2100  cefTRIAXone (ROCEPHIN) 2 g in sodium chloride 0.9 % 100 mL IVPB        2 g 200 mL/hr over 30 Minutes Intravenous Every 24 hours 08/08/22 1818 08/09/22 0130   08/06/22 0200  vancomycin (VANCOCIN) IVPB 1000 mg/200 mL premix  Status:  Discontinued        1,000 mg 200 mL/hr over 60 Minutes Intravenous Every 12 hours 08/05/22 1934 08/08/22 1818   08/05/22 2100  cefTRIAXone (ROCEPHIN) 2 g in sodium chloride 0.9 % 100 mL IVPB  Status:  Discontinued        2 g 200 mL/hr over 30 Minutes Intravenous Every 24 hours 08/05/22 2029 08/08/22 1818   08/03/22 1000  metroNIDAZOLE (FLAGYL) IVPB 500 mg  Status:  Discontinued        500 mg 100 mL/hr over 60 Minutes Intravenous Every 12 hours 08/02/22 2206 08/08/22 1818   08/03/22 0600  ceFEPIme (MAXIPIME) 2 g in sodium chloride 0.9 % 100 mL IVPB  Status:  Discontinued        2 g 200 mL/hr over 30 Minutes Intravenous Every 8 hours 08/02/22 1918 08/05/22 2028   08/03/22 0330  vancomycin (VANCOREADY) IVPB 750 mg/150 mL  Status:  Discontinued        750 mg 150 mL/hr over 60 Minutes Intravenous Every 12 hours 08/02/22 1918 08/05/22 1934   08/02/22 1930  vancomycin (VANCOREADY) IVPB 1500 mg/300 mL        1,500 mg 150 mL/hr over 120 Minutes Intravenous  Once 08/02/22 1915 08/02/22 2207   08/02/22 1915  ceFEPIme (MAXIPIME) 2 g in sodium chloride 0.9 % 100 mL IVPB  Status:  Discontinued        2 g 200 mL/hr over 30 Minutes  Intravenous  Once 08/02/22 1900 08/02/22 1918   08/02/22 1915  metroNIDAZOLE (FLAGYL) IVPB 500 mg        500 mg 100 mL/hr over 60 Minutes Intravenous  Once 08/02/22 1900 08/02/22 2135   08/02/22 1915  vancomycin (VANCOCIN) IVPB 1000 mg/200 mL premix  Status:  Discontinued        1,000 mg 200 mL/hr over 60 Minutes Intravenous  Once 08/02/22 1900 08/02/22 1915       Subjective: Nonverbal Discussed with daughter over phone  Objective: Vitals:   08/13/22 0456 08/13/22 0743 08/13/22 1139 08/13/22 1700  BP:  116/63  129/75  Pulse:  84 94 89  Resp:  18  (!) 22  Temp:  98.8 F (37.1 C)  98.8 F (37.1 C)  TempSrc:  Axillary  Axillary  SpO2:  91%  92%  Weight: 79.4 kg     Height:        Intake/Output Summary (Last 24 hours) at 08/13/2022 1917 Last data filed at 08/13/2022 1300 Gross per 24 hour  Intake 1114.55 ml  Output 1800 ml  Net -685.45 ml   Filed Weights   08/11/22 0531 2022-09-05 0519 08/13/22 0456  Weight: 82.2 kg 79.1 kg 79.4 kg    Examination:  General: No acute distress. Cardiovascular: RRR Lungs: unlabored, on 4 L Abdomen: Soft, nontender, nondistended  Neurological: moving LE's spontaneously, opens eyes to voice, doesn't follow any other commands Extremities: No clubbing or cyanosis. No edema.  Data Reviewed: I have personally reviewed following labs and imaging studies  CBC: Recent Labs  Lab 08/09/22 0458 08/10/22 0525 08/11/22 0149 2022-09-05 0200 08/13/22 0434  WBC 65.0* 68.3* 44.1* 46.7* 48.4*  NEUTROABS 7.8* 9.6* 18.5* 19.1* 12.2*  HGB 12.6 11.9* 11.5* 12.1 11.8*  HCT 38.6 35.6* 34.1* 36.4 35.3*  MCV 98.5 97.5 95.8 96.0 96.2  PLT 221 241 226 308 092    Basic Metabolic Panel: Recent Labs  Lab 08/09/22 0458 08/09/22 1606 08/10/22 0525 08/11/22 0149 09/05/22 0200 2022/09/05 1640 08/13/22 0434 08/13/22 1749  NA 138  --  137 135 138  --  138  --   K 3.6  --  3.5 4.4 4.2 4.3 4.1 3.6  CL 103  --  104 99 100  --  96*  --   CO2 24  --  $R'26 27  29  'pw$ --  28  --   GLUCOSE 173*  --  206* 192* 171*  --  123*  --   BUN 18  --  $R'16 16 18  'wg$ --  26*  --   CREATININE 0.63  --  0.62 0.62 0.68  --  0.73  --   CALCIUM 8.1*  --  8.0* 8.0* 8.4*  --  8.6*  --   MG 2.1 2.1 2.1 2.0 2.4 2.2 2.4 2.2  PHOS 1.9* 2.0* 1.9* 2.1* 2.8  --   --   --     GFR: Estimated Creatinine Clearance: 67.2 mL/min (by C-G formula based on SCr of 0.73 mg/dL).  Liver Function Tests: Recent Labs  Lab 08/09/22 0458 08/11/22 0149 Sep 05, 2022 0200  AST $Re'18 16 17  'AcO$ ALT $R'11 11 12  'pB$ ALKPHOS 76 66 66  BILITOT 0.1* 0.4 0.6  PROT 5.7* 5.5* 5.8*  ALBUMIN 2.1* 2.0* 2.2*    CBG: Recent Labs  Lab 05-Sep-2022 2346 08/13/22 0405 08/13/22 0738 08/13/22 1256 08/13/22 1644  GLUCAP 177* 124* 140* 217* 231*     No results found for this or any previous visit (from the past 240 hour(s)).        Radiology Studies: EEG adult  Result Date: 2022-09-05 Lora Havens, MD     09/05/22  3:36 PM Patient Name: BEVA REMUND MRN: [id number] Epilepsy Attending: Lora Havens Referring Physician/Provider: Elodia Florence., MD Date: 2022-09-05 Duration: 27.03 mins Patient history: 66yo F with ams. EEG to evaluate for seizure Level of alertness: Awake AEDs during EEG study: None Technical aspects: This EEG study was done with scalp electrodes positioned according to the 10-20 International system of electrode placement. Electrical activity was reviewed with band pass filter of 1-$RemoveBef'70Hz'TzaFaHgTvr$ , sensitivity of 7 uV/mm, display speed of 37mm/sec with Deundra Furber $Remo'60Hz'OZUbO$  notched filter applied as appropriate. EEG data were  recorded continuously and digitally stored.  Video monitoring was available and reviewed as appropriate. Description: EEG showed continuous generalized 3 to 6 Hz theta-delta slowing. Hyperventilation and photic stimulation were not performed.   Of note, study was technically difficult due to significant chewing artifact. ABNORMALITY - Continuous slow, generalized IMPRESSION: This  technically difficult study is suggestive of moderate diffuse encephalopathy, nonspecific etiology. No seizures or epileptiform discharges were seen throughout the recording. Priyanka Barbra Sarks        Scheduled Meds:  acetaminophen  650 mg Per Tube Q6H   Or   acetaminophen  650 mg Rectal Q6H   amantadine  200 mg Per Tube BID   docusate  100 mg Per Tube QHS   ezetimibe  10 mg Per Tube Daily   feeding supplement (JEVITY 1.5 CAL/FIBER)  1,000 mL Per Tube Q24H   feeding supplement (PROSource TF20)  60 mL Per Tube TID   furosemide  40 mg Intravenous Daily   insulin aspart  0-9 Units Subcutaneous Q4H   metoprolol tartrate  25 mg Per Tube BID   sennosides  5 mL Per Tube QHS   sodium chloride flush  3 mL Intravenous Q12H   Continuous Infusions:  sodium chloride Stopped (08/11/22 0008)   heparin 1,100 Units/hr (08/13/22 1745)     LOS: 10 days    Time spent: over 30 min    Fayrene Helper, MD [facility]   To contact the attending provider between 7A-7P or the covering provider during after hours 7P-7A, please log into the web site [email or web address] and access using universal [place] password for that web site. If you do not have the password, please call the hospital operator.  08/13/2022, 7:17 PM

## 2022-08-13 NOTE — Progress Notes (Signed)
ANTICOAGULATION CONSULT NOTE - Follow Up Consult  Pharmacy Consult for heparin Indication:  multiple embolic strokes with paradoxical embolism and DVT  Labs: Recent Labs    08/11/22 0149 08/12/22 0200 08/12/22 1640 08/13/22 0434  HGB 11.5* 12.1  --  11.8*  HCT 34.1* 36.4  --  35.3*  PLT 226 308  --  341  HEPARINUNFRC 0.68 0.67 0.38 0.36  CREATININE 0.62 0.68  --  0.73     Assessment: 66yo female on heparin with initial cautious dosing for paradoxical embolism and DVT in setting of multiple embolic strokes.   Heparin level therapeutic this AM (0.36).  No issues with infusion or bleeding per RN.  Goal of Therapy:  Heparin level 0.3-0.5 units/ml per neurology   Plan:  Cont heparin 1100 units/hr Heparin level daily with CBC daily Monitor for s/sx of bleeding Will follow plans for oral anticoagulation  Francena Hanly, PharmD Pharmacy Resident  08/13/2022 7:51 AM

## 2022-08-14 DIAGNOSIS — A4102 Sepsis due to Methicillin resistant Staphylococcus aureus: Secondary | ICD-10-CM | POA: Diagnosis not present

## 2022-08-14 DIAGNOSIS — Z515 Encounter for palliative care: Secondary | ICD-10-CM | POA: Diagnosis not present

## 2022-08-14 DIAGNOSIS — Z7189 Other specified counseling: Secondary | ICD-10-CM | POA: Diagnosis not present

## 2022-08-14 LAB — GLUCOSE, CAPILLARY
Glucose-Capillary: 146 mg/dL — ABNORMAL HIGH (ref 70–99)
Glucose-Capillary: 158 mg/dL — ABNORMAL HIGH (ref 70–99)
Glucose-Capillary: 160 mg/dL — ABNORMAL HIGH (ref 70–99)
Glucose-Capillary: 167 mg/dL — ABNORMAL HIGH (ref 70–99)
Glucose-Capillary: 174 mg/dL — ABNORMAL HIGH (ref 70–99)
Glucose-Capillary: 222 mg/dL — ABNORMAL HIGH (ref 70–99)

## 2022-08-14 LAB — COMPREHENSIVE METABOLIC PANEL
ALT: 15 U/L (ref 0–44)
AST: 16 U/L (ref 15–41)
Albumin: 2.3 g/dL — ABNORMAL LOW (ref 3.5–5.0)
Alkaline Phosphatase: 57 U/L (ref 38–126)
Anion gap: 12 (ref 5–15)
BUN: 27 mg/dL — ABNORMAL HIGH (ref 8–23)
CO2: 30 mmol/L (ref 22–32)
Calcium: 8.5 mg/dL — ABNORMAL LOW (ref 8.9–10.3)
Chloride: 98 mmol/L (ref 98–111)
Creatinine, Ser: 0.7 mg/dL (ref 0.44–1.00)
GFR, Estimated: >60 mL/min (ref >60)
Glucose, Bld: 118 mg/dL — ABNORMAL HIGH (ref 70–99)
Potassium: 3.7 mmol/L (ref 3.5–5.1)
Sodium: 140 mmol/L (ref 135–145)
Total Bilirubin: 0.5 mg/dL (ref 0.3–1.2)
Total Protein: 6.1 g/dL — ABNORMAL LOW (ref 6.5–8.1)

## 2022-08-14 LAB — CBC WITH DIFFERENTIAL/PLATELET
Abs Immature Granulocytes: 0.32 10*3/uL — ABNORMAL HIGH (ref 0.00–0.07)
Basophils Absolute: 0.1 10*3/uL (ref 0.0–0.1)
Basophils Relative: 0 %
Eosinophils Absolute: 60.6 10*3/uL — ABNORMAL HIGH (ref 0.0–0.5)
Eosinophils Relative: 85 %
HCT: 36.4 % (ref 36.0–46.0)
Hemoglobin: 12.6 g/dL (ref 12.0–15.0)
Immature Granulocytes: 0 %
Lymphocytes Relative: 3 %
Lymphs Abs: 2 10*3/uL (ref 0.7–4.0)
MCH: 33.5 pg (ref 26.0–34.0)
MCHC: 34.6 g/dL (ref 30.0–36.0)
MCV: 96.8 fL (ref 80.0–100.0)
Monocytes Absolute: 0.5 10*3/uL (ref 0.1–1.0)
Monocytes Relative: 1 %
Neutro Abs: 7.7 10*3/uL (ref 1.7–7.7)
Neutrophils Relative %: 11 %
Platelets: 379 10*3/uL (ref 150–400)
RBC: 3.76 MIL/uL — ABNORMAL LOW (ref 3.87–5.11)
RDW: 17.6 % — ABNORMAL HIGH (ref 11.5–15.5)
WBC: 71.2 10*3/uL (ref 4.0–10.5)
nRBC: 0.1 % (ref 0.0–0.2)

## 2022-08-14 LAB — MAGNESIUM
Magnesium: 2.3 mg/dL (ref 1.7–2.4)
Magnesium: 2.3 mg/dL (ref 1.7–2.4)

## 2022-08-14 LAB — PHOSPHORUS: Phosphorus: 2.8 mg/dL (ref 2.5–4.6)

## 2022-08-14 LAB — POTASSIUM: Potassium: 3.8 mmol/L (ref 3.5–5.1)

## 2022-08-14 LAB — HEPARIN LEVEL (UNFRACTIONATED): Heparin Unfractionated: 0.31 IU/mL (ref 0.30–0.70)

## 2022-08-14 NOTE — Progress Notes (Signed)
ANTICOAGULATION CONSULT NOTE - Follow Up Consult  Pharmacy Consult for heparin Indication:  multiple embolic strokes with paradoxical embolism and DVT  Labs: Recent Labs    08/12/22 0200 08/12/22 1640 08/13/22 0434 08/14/22 0533  HGB 12.1  --  11.8* 12.6  HCT 36.4  --  35.3* 36.4  PLT 308  --  341 379  HEPARINUNFRC 0.67 0.38 0.36 0.31  CREATININE 0.68  --  0.73 0.70     Assessment: 66yo female on heparin with initial cautious dosing for paradoxical embolism and DVT in setting of multiple embolic strokes.   Heparin level therapeutic this AM (0.31).  No issues with infusion or bleeding per RN.  Goal of Therapy:  Heparin level 0.3-0.5 units/ml per neurology   Plan:  Cont heparin 1100 units/hr Heparin level daily with CBC daily Monitor for s/sx of bleeding Will follow plans for oral anticoagulation  Francena Hanly, PharmD Pharmacy Resident  08/14/2022 7:59 AM

## 2022-08-14 NOTE — Progress Notes (Signed)
Palliative Medicine Inpatient Follow Up Note HPI: 66 y.o. female  with past medical history of HTN, HLD, T2DM, anemia, arthritis, remote history of melanoma, and recent diagnosis of T-cell lymphoma admitted on 08/02/2022 with altered mental status.  MRI brain was positive for scattered cortical and subcortical infarcts.  Patient now with acute hypoxic respiratory failure requiring BiPAP.  Respiratory status improved with IV Lasix.  Patient with ongoing acute metabolic encephalopathy.  She occasionally verbalizes a few words.  She is unable to move her upper extremities.  Patient requiring core track for artificial nutrition.  Chest x-ray 10/18 with bilateral pulmonary nodules concerning for metastatic disease.  PMT consulted to discuss goals of care.  Today's Discussion 08/14/2022  *Please note that this is a verbal dictation therefore any spelling or grammatical errors are due to the "[place]" system interpretation.  Chart reviewed inclusive of vital signs, progress notes, laboratory results, and diagnostic images.   I met with Sarah Walsh and her daughter, Sarah Walsh at bedside this morning.  Sarah Walsh expresses that she and her brother had a conversation last night and are thinking that they would like to pursue comfort focused care tomorrow.  She vocalizes that her mother would never want to live like this and she does not want to keep her on earth and a condition she would not desire.  We reviewed that if this is what her mother would want then that is the best decision for the patient.  I shared with comfort focused care looks like and vocalized in the setting of Sarah Walsh's volume overload it would likely benefit her most to start her on a low-dose opioid drip and we transition the focus to comfort.  Patient's daughter is concerned about her transitioning to an inpatient hospice.  I shared that I would first want to verify patient is stable symptomatically before transitioning.  While at bedside I met  with patient's ex-husband who was also informed with the plan per her daughter.  Questions and concerns addressed/Palliative Support Provided.   Objective Assessment: Vital Signs Vitals:   08/14/22 0404 08/14/22 0836  BP: 130/78 125/64  Pulse: 89 89  Resp: 20 19  Temp: 98 F (36.7 C) 98.8 F (37.1 C)  SpO2: 94% 94%    Intake/Output Summary (Last 24 hours) at 08/14/2022 1347 Last data filed at 08/13/2022 2052 Gross per 24 hour  Intake 500 ml  Output --  Net 500 ml    Last Weight  Most recent update: 08/14/2022  4:17 AM    Weight  76.4 kg (168 lb 6.9 oz)            Gen: Elderly Caucasian F in NAD HEENT: moist mucous membranes CV: Regular rate and rhythm  PULM:  On 4LPM [place], deep abdominal breathing present ABD: soft/nontender  EXT: No edema  Neuro: Somnolent, minimally responsive  SUMMARY OF RECOMMENDATIONS   DNAR/DNI   Plan for transition to comfort oriented focus tomorrow- ->have spoken to daughter about the idea of a low-dose opioid drip as well as monitoring for symptoms prior to further conversations about transition to inpatient hospice  Ongoing Palliative Support  Billing based on MDM: High ______________________________________________________________________________________ [place] Team Team Cell Phone: [phone] Please utilize secure chat with additional questions, if there is no response within 30 minutes please call the above phone number  Palliative Medicine Team providers are available by phone from 7am to 7pm daily and can be reached through the team cell phone.  Should this  patient require assistance outside of these hours, please call the patient's attending physician.

## 2022-08-14 NOTE — Progress Notes (Signed)
PROGRESS NOTE    Sarah Walsh  OEU:[id number] DOB: 1956/07/01 DOA: 08/02/2022 PCP: Sueanne Margarita, DO  Chief Complaint  Patient presents with   Altered Mental Status    Brief Narrative:  Sarah Walsh is Sarah Walsh 66 y.o. female with Sarah Walsh history of T2DM, HLD, HTN, recent cervical LN biopsy with +lymphoma reportedly T cell, not yet started on treatment, presented to ED 10/10 with lethargy, confusion, anorexia, gait instability. MRI brain was positive for scattered cortical and subcortical infarcts. She was admitted, also noted to have fevers, WBC 15.4M with eosinophilic predominance.    Repeat MRI due to worsening level of consciousness showed extension of embolic shower strokes. EEG nonspecific without seizure recorded. Broad IV antibiotics have been continues empirically though no nidus of infection has been identified. Goals of care discussions are ongoing. Palliative care is consulted to assist.   Assessment & Plan:   Principal Problem:   Sepsis ([place]) Active Problems:   Essential hypertension   Encephalopathy acute   Lymphoma ([place])   DVT (deep venous thrombosis) (HCC)   Acute metabolic encephalopathy   Acute CVA (cerebrovascular accident) ([place])   Malnutrition of moderate degree   Acute respiratory failure with hypoxia (HCC)   Assessment and Plan: Goals of Care Appreciate palliative care assistance, likely plan is for transition to comfort care in AM.  Seizure Like Activity EEG without seizures or epileptiform discharges Appreciate additional neuro recs  Acute Hypoxic Respiratory Failure Increased wob today with diminished breath sounds, expiratory wheezing Prior CT showed no PE (10/11 imaging), but did show multiple enlarged mediastinal and hilar LN's and pulm nodules measuring up to 4.1 CM as well as mild hazy geographic ggo's in the lungs, possible edema or air trapping CXR 10/18 with bilateral pulm nodules concerning for metastatic disease No true fevers, leukocytosis related  to lymphoma Required bipap 10/18 PCCM consulted, now signed off.   Will continue lasix, steroids completed.  Can use bipap as needed for wob.  Low threshold for repeat imaging, restarting abx.  Sepsis: Patient met sepsis criteria on admission with tachycardia, fevers, leukocytosis, lactic acidosis, source unclear.   - afebrile at this point, leukocytosis related to lymphoma, low threshold to restart abx in setting of above, but lower suspicion for infectious cause at this point - bcx NGTD from 10/10. DDx fever includes due to lymphoma, central, less likely DVT in and of itself. - With negative cultures, completed ceftriaxone 2g q24h, vancomycin, flagyl x7 days (10/16). - Fungitell negative - Scheduled tylenol - Fever curve improved   Acute metabolic encephalopathy: Most likely related to widespread embolic CVAs.  - EEG with nonspecific changes without seizure activity noted initially. EEG repeated and unchanged 10/15. Anticipate some waxing/waning.  - Amantadine started per neurology for arousal - Limited neurological recovery at this time. Appreciate neurology recommendations.   Extensive bilateral cortical and subcortical ischemic CVAs: MRI brain showed numerous diffuse bilateral foci of abnormal diffusion restriction in Sarah Walsh pattern most suggestive of embolic shower but could also indicate watershed ischemia.  - repeat MRI 10/13 showed extensive acute infarcts throughout both cerebral and cerebellar hemispheres across multiple vascular distributions again favored embolic (with ddx including watershed ischemia) - increased in extent from MRI done previously  - PFO found on TEE. In conjunction with know DVT (albeit distal) and embolic distribution, started heparin, goal level 0.3-0.5.  - Neuro checks to continue - Cortrak inserted 10/16 - tube feeds have been resumed - We are pursing palliative care discussions with the family daily. They are  very involved and have Sarah Walsh realistic understanding of  expected poor prognosis, though it is ultimate status is yet to be determined. Will consult palliative for ongoing discussions.     Dark urine: Due to bilirubinuria due to lymphoma. No WBCs on repeat UA.    Metabolic acidosis: No anion gap. Resolved.   Hypokalemia:  - improved   PSVT: Burden decreased on repeat tele review today.  - Convert IV to enteral metoprolol. BP tolerating.   HTN:  - BP goal per neurology, prn hydralazine ordered   Acute RLE peroneal DVT - CTA negative for PE.  - Heparin as discussed above.  - Interestingly, per Dr. Hadley Pen discussion with heme/onc, Dr. Lorenso Courier, lymphomas are not classically associated with hypercoagulability. The severity of leukocytosis is also not near the expected threshold to cause leukostasis.    T-cell lymphoma: Recent diagnosis, has not been started on treatment yet.   - Oncology, Dr. Lorenso Courier consulted. Patient is not currently Sarah Walsh candidate for treatment. No clear indication for pulse steroid per his evaluation.  At the time of his consult, he was concerned that she may never make substantial recovery from stroke to tolerate chemo. - There is no further work up or treatment currently planned as an inpatient for this issue.    Eosinophilic leukocytosis:  - suspect this is related to her T cell lymphoma. Note subtypes of T cell lymphomas can aberrantly express IL-5.  - Work up has not suggested nephritis, hepatitis, She has no history of this prior to diagnosis, no ongoing drug/allergic reaction, no parasitic infection detected. Could be expected at the time of her drug eruption after Tx for MRSA recently though that agent was stopped and rash has resolved durably. HIV NR - Will plan to monitor CBC w/differential.  Eos fluctuating, rising with WBC count.     DVT prophylaxis: heparin Code Status: DNR after conversation with palliative 10/19 Family Communication: daughter, son Disposition:   Status is: Inpatient Remains inpatient  appropriate because: continued need for inpatient care   Consultants:  PCCM Oncology Neurology  Procedures:  TEE IMPRESSIONS     1. Left ventricular ejection fraction, by estimation, is 55 to 60%. The  left ventricle has normal function.   2. Right ventricular systolic function is normal. The right ventricular  size is normal. There is moderately elevated pulmonary artery systolic  pressure.   3. No left atrial/left atrial appendage thrombus was detected.   4. The mitral valve is normal in structure. Mild mitral valve  regurgitation.   5. The aortic valve is tricuspid. Aortic valve regurgitation is not  visualized.   6. Evidence of atrial level shunting detected by color flow Doppler.  Agitated saline contrast bubble study was positive with shunting observed  within 3-6 cardiac cycles suggestive of interatrial shunt. Few bubbles  seen in left atrium after patient  coughed, suggesting small PFO. Small PFO confirmed visually with left to  right shunting.   7. No vegetation seen   Echo IMPRESSIONS     1. Left ventricular ejection fraction, by estimation, is 60 to 65%. The  left ventricle has normal function. The left ventricle has no regional  wall motion abnormalities. There is mild concentric left ventricular  hypertrophy. Left ventricular diastolic  parameters are consistent with Grade I diastolic dysfunction (impaired  relaxation).   2. Right ventricular systolic function is normal. The right ventricular  size is normal. Tricuspid regurgitation signal is inadequate for assessing  PA pressure.   3. The mitral valve is  normal in structure. No evidence of mitral valve  regurgitation. No evidence of mitral stenosis.   4. The aortic valve is tricuspid. There is mild calcification of the  aortic valve. Aortic valve regurgitation is not visualized. No aortic  stenosis is present.   5. The inferior vena cava is normal in size with greater than 50%  respiratory variability,  suggesting right atrial pressure of 3 mmHg.  LE [place] Summary:  RIGHT:  - There is no evidence of deep vein thrombosis in the lower extremity.     - No cystic structure found in the popliteal fossa.     - Ultrasound characteristics of enlarged lymph nodes are noted in the  groin.     LEFT:  - Findings consistent with acute deep vein thrombosis involving the left  peroneal veins.     - No cystic structure found in the popliteal fossa.     - Ultrasound characteristics of enlarged lymph nodes noted in the groin.      Antimicrobials:  Anti-infectives (From admission, onward)    Start     Dose/Rate Route Frequency Ordered Stop   08/08/22 2100  cefTRIAXone (ROCEPHIN) 2 g in sodium chloride 0.9 % 100 mL IVPB        2 g 200 mL/hr over 30 Minutes Intravenous Every 24 hours 08/08/22 1818 08/09/22 0130   08/06/22 0200  vancomycin (VANCOCIN) IVPB 1000 mg/200 mL premix  Status:  Discontinued        1,000 mg 200 mL/hr over 60 Minutes Intravenous Every 12 hours 08/05/22 1934 08/08/22 1818   08/05/22 2100  cefTRIAXone (ROCEPHIN) 2 g in sodium chloride 0.9 % 100 mL IVPB  Status:  Discontinued        2 g 200 mL/hr over 30 Minutes Intravenous Every 24 hours 08/05/22 2029 08/08/22 1818   08/03/22 1000  metroNIDAZOLE (FLAGYL) IVPB 500 mg  Status:  Discontinued        500 mg 100 mL/hr over 60 Minutes Intravenous Every 12 hours 08/02/22 2206 08/08/22 1818   08/03/22 0600  ceFEPIme (MAXIPIME) 2 g in sodium chloride 0.9 % 100 mL IVPB  Status:  Discontinued        2 g 200 mL/hr over 30 Minutes Intravenous Every 8 hours 08/02/22 1918 08/05/22 2028   08/03/22 0330  vancomycin (VANCOREADY) IVPB 750 mg/150 mL  Status:  Discontinued        750 mg 150 mL/hr over 60 Minutes Intravenous Every 12 hours 08/02/22 1918 08/05/22 1934   08/02/22 1930  vancomycin (VANCOREADY) IVPB 1500 mg/300 mL        1,500 mg 150 mL/hr over 120 Minutes Intravenous  Once 08/02/22 1915 08/02/22 2207   08/02/22 1915  ceFEPIme  (MAXIPIME) 2 g in sodium chloride 0.9 % 100 mL IVPB  Status:  Discontinued        2 g 200 mL/hr over 30 Minutes Intravenous  Once 08/02/22 1900 08/02/22 1918   08/02/22 1915  metroNIDAZOLE (FLAGYL) IVPB 500 mg        500 mg 100 mL/hr over 60 Minutes Intravenous  Once 08/02/22 1900 08/02/22 2135   08/02/22 1915  vancomycin (VANCOCIN) IVPB 1000 mg/200 mL premix  Status:  Discontinued        1,000 mg 200 mL/hr over 60 Minutes Intravenous  Once 08/02/22 1900 08/02/22 1915       Subjective: Daughter and son at bedside Mostly nonverbal   Objective: Vitals:   08/13/22 1700 08/13/22 2005 08/14/22 0404 08/14/22 6754  BP: 129/75 129/72 130/78 125/64  Pulse: 89 93 89 89  Resp: (!) _0 Temp: 98.8 F (37.1 C) 98.8 F (37.1 C) 98 F (36.7 C) 98.8 F (37.1 C)  TempSrc: Axillary Axillary Oral Axillary  SpO2: 92% 91% 94% 94%  Weight:   76.4 kg   Height:        Intake/Output Summary (Last 24 hours) at 08/14/2022 1935 Last data filed at 08/14/2022 1500 Gross per 24 hour  Intake 500 ml  Output 800 ml  Net -300 ml   Filed Weights   08/12/22 0519 08/13/22 0456 08/14/22 0404  Weight: 79.1 kg 79.4 kg 76.4 kg    Examination:  General: No acute distress. Lungs: comfortable on [place] Neurological: said Sarah Walsh word or two in response to children, nothing for me - spontaneously movnig LE Extremities: No clubbing or cyanosis. No edema. Data Reviewed: I have personally reviewed following labs and imaging studies  CBC: Recent Labs  Lab 08/10/22 0525 08/11/22 0149 08/12/22 0200 08/13/22 0434 08/14/22 0533  WBC 68.3* 44.1* 46.7* 48.4* 71.2*  NEUTROABS 9.6* 18.5* 19.1* 12.2* 7.7  HGB 11.9* 11.5* 12.1 11.8* 12.6  HCT 35.6* 34.1* 36.4 35.3* 36.4  MCV 97.5 95.8 96.0 96.2 96.8  PLT 241 226 308 341 144    Basic Metabolic Panel: Recent Labs  Lab 08/09/22 1606 08/10/22 0525 08/11/22 0149 08/12/22 0200 08/12/22 1640 08/13/22 0434 08/13/22 1749 08/14/22 0533 08/14/22 1752  NA   --  137 135 138  --  138  --  140  --   K  --  3.5 4.4 4.2 4.3 4.1 3.6 3.7 3.8  CL  --  104 99 100  --  96*  --  98  --   CO2  --  _1 --  28  --  30  --   GLUCOSE  --  206* 192* 171*  --  123*  --  118*  --   BUN  --  _2 --  26*  --  27*  --   CREATININE  --  0.62 0.62 0.68  --  0.73  --  0.70  --   CALCIUM  --  8.0* 8.0* 8.4*  --  8.6*  --  8.5*  --   MG 2.1 2.1 2.0 2.4 2.2 2.4 2.2 2.3 2.3  PHOS 2.0* 1.9* 2.1* 2.8  --   --   --  2.8  --     GFR: Estimated Creatinine Clearance: 65.9 mL/min (by C-G formula based on SCr of 0.7 mg/dL).  Liver Function Tests: Recent Labs  Lab 08/09/22 0458 08/11/22 0149 08/12/22 0200 08/14/22 0533  AST _3 ALT _4 ALKPHOS 76 66 66 57  BILITOT 0.1* 0.4 0.6 0.5  PROT 5.7* 5.5* 5.8* 6.1*  ALBUMIN 2.1* 2.0* 2.2* 2.3*    CBG: Recent Labs  Lab 08/14/22 0022 08/14/22 0407 08/14/22 0801 08/14/22 1201 08/14/22 1619  GLUCAP 160* 146* 158* 174* 167*     No results found for this or any previous visit (from the past 240 hour(s)).        Radiology Studies: No results found.      Scheduled Meds:  acetaminophen  650 mg Per Tube Q6H   Or   acetaminophen  650 mg Rectal Q6H   amantadine  200 mg Per Tube BID   docusate  100 mg Per Tube QHS   ezetimibe  10 mg  Per Tube Daily   feeding supplement (JEVITY 1.5 CAL/FIBER)  1,000 mL Per Tube Q24H   feeding supplement (PROSource TF20)  60 mL Per Tube TID   furosemide  40 mg Intravenous Daily   insulin aspart  0-9 Units Subcutaneous Q4H   metoprolol tartrate  25 mg Per Tube BID   sennosides  5 mL Per Tube QHS   sodium chloride flush  3 mL Intravenous Q12H   Continuous Infusions:  sodium chloride Stopped (08/11/22 0008)   heparin 1,100 Units/hr (08/14/22 1731)     LOS: 11 days    Time spent: over 30 min    Fayrene Helper, MD [facility]   To contact the attending provider between 7A-7P or the covering provider during after hours 7P-7A,  please log into the web site [email or web address] and access using universal [place] password for that web site. If you do not have the password, please call the hospital operator.  08/14/2022, 7:35 PM

## 2022-08-15 DIAGNOSIS — C8448 Peripheral T-cell lymphoma, not classified, lymph nodes of multiple sites: Secondary | ICD-10-CM | POA: Diagnosis not present

## 2022-08-15 DIAGNOSIS — Z515 Encounter for palliative care: Secondary | ICD-10-CM | POA: Diagnosis not present

## 2022-08-15 DIAGNOSIS — A4102 Sepsis due to Methicillin resistant Staphylococcus aureus: Secondary | ICD-10-CM | POA: Diagnosis not present

## 2022-08-15 DIAGNOSIS — I639 Cerebral infarction, unspecified: Secondary | ICD-10-CM | POA: Diagnosis not present

## 2022-08-15 DIAGNOSIS — Z7189 Other specified counseling: Secondary | ICD-10-CM | POA: Diagnosis not present

## 2022-08-15 LAB — CBC WITH DIFFERENTIAL/PLATELET
Abs Immature Granulocytes: 0.27 10*3/uL — ABNORMAL HIGH (ref 0.00–0.07)
Basophils Absolute: 0.3 10*3/uL — ABNORMAL HIGH (ref 0.0–0.1)
Basophils Relative: 0 %
Eosinophils Absolute: 55.1 10*3/uL — ABNORMAL HIGH (ref 0.0–0.5)
Eosinophils Relative: 83 %
HCT: 35.6 % — ABNORMAL LOW (ref 36.0–46.0)
Hemoglobin: 12.1 g/dL (ref 12.0–15.0)
Immature Granulocytes: 0 %
Lymphocytes Relative: 3 %
Lymphs Abs: 1.9 10*3/uL (ref 0.7–4.0)
MCH: 33.4 pg (ref 26.0–34.0)
MCHC: 34 g/dL (ref 30.0–36.0)
MCV: 98.3 fL (ref 80.0–100.0)
Monocytes Absolute: 0.5 10*3/uL (ref 0.1–1.0)
Monocytes Relative: 1 %
Neutro Abs: 9 10*3/uL — ABNORMAL HIGH (ref 1.7–7.7)
Neutrophils Relative %: 13 %
Platelets: 364 10*3/uL (ref 150–400)
RBC: 3.62 MIL/uL — ABNORMAL LOW (ref 3.87–5.11)
RDW: 17.6 % — ABNORMAL HIGH (ref 11.5–15.5)
WBC: 67 10*3/uL (ref 4.0–10.5)
nRBC: 0 % (ref 0.0–0.2)

## 2022-08-15 LAB — MAGNESIUM: Magnesium: 2.3 mg/dL (ref 1.7–2.4)

## 2022-08-15 LAB — COMPREHENSIVE METABOLIC PANEL
ALT: 15 U/L (ref 0–44)
AST: 14 U/L — ABNORMAL LOW (ref 15–41)
Albumin: 2.3 g/dL — ABNORMAL LOW (ref 3.5–5.0)
Alkaline Phosphatase: 60 U/L (ref 38–126)
Anion gap: 11 (ref 5–15)
BUN: 23 mg/dL (ref 8–23)
CO2: 29 mmol/L (ref 22–32)
Calcium: 8.6 mg/dL — ABNORMAL LOW (ref 8.9–10.3)
Chloride: 98 mmol/L (ref 98–111)
Creatinine, Ser: 0.63 mg/dL (ref 0.44–1.00)
GFR, Estimated: >60 mL/min (ref >60)
Glucose, Bld: 132 mg/dL — ABNORMAL HIGH (ref 70–99)
Potassium: 3.7 mmol/L (ref 3.5–5.1)
Sodium: 138 mmol/L (ref 135–145)
Total Bilirubin: 0.4 mg/dL (ref 0.3–1.2)
Total Protein: 6.3 g/dL — ABNORMAL LOW (ref 6.5–8.1)

## 2022-08-15 LAB — GLUCOSE, CAPILLARY
Glucose-Capillary: 139 mg/dL — ABNORMAL HIGH (ref 70–99)
Glucose-Capillary: 123 mg/dL — ABNORMAL HIGH (ref 70–99)
Glucose-Capillary: 189 mg/dL — ABNORMAL HIGH (ref 70–99)
Glucose-Capillary: 170 mg/dL — ABNORMAL HIGH (ref 70–99)

## 2022-08-15 LAB — HEPARIN LEVEL (UNFRACTIONATED): Heparin Unfractionated: 0.36 IU/mL (ref 0.30–0.70)

## 2022-08-15 LAB — PHOSPHORUS: Phosphorus: 3 mg/dL (ref 2.5–4.6)

## 2022-08-15 MED ORDER — GLYCOPYRROLATE 0.2 MG/ML IJ SOLN: 0.4000 mg | INTRAMUSCULAR | Status: DC | PRN

## 2022-08-15 MED ORDER — HALOPERIDOL 0.5 MG PO TABS: 0.5000 mg | ORAL_TABLET | ORAL | Status: DC | PRN

## 2022-08-15 MED ORDER — MORPHINE 100MG IN NS 100ML (1MG/ML) PREMIX INFUSION
1.0000 mg/h | INTRAVENOUS | Status: DC
  Administered 2022-08-15: 1 mg/h via INTRAVENOUS
  Administered 2022-08-18: 1.5 mg/h via INTRAVENOUS
  Filled 2022-08-15 (×2): qty 100

## 2022-08-15 MED ORDER — BIOTENE DRY MOUTH MT LIQD: 15.0000 mL | OROMUCOSAL | Status: DC | PRN

## 2022-08-15 MED ORDER — HALOPERIDOL LACTATE 5 MG/ML IJ SOLN: 0.5000 mg | INTRAMUSCULAR | Status: DC | PRN

## 2022-08-15 MED ORDER — MORPHINE BOLUS VIA INFUSION
1.0000 mg | INTRAVENOUS | Status: DC | PRN
  Administered 2022-08-15: 2 mg via INTRAVENOUS
  Administered 2022-08-16 (×3): 1 mg via INTRAVENOUS

## 2022-08-15 MED ORDER — HALOPERIDOL LACTATE 2 MG/ML PO CONC: 0.5000 mg | ORAL | Status: DC | PRN

## 2022-08-15 MED ORDER — POLYVINYL ALCOHOL 1.4 % OP SOLN: 1.0000 [drp] | Freq: Four times a day (QID) | OPHTHALMIC | Status: DC | PRN

## 2022-08-15 NOTE — Progress Notes (Signed)
OT Cancellation Note  Patient Details Name: Sarah Walsh MRN: [id number] DOB: [birth year]   Cancelled Treatment:    Reason Eval/Treat Not Completed: Other (comment) (pt with plans to transition to comfort measures. No further acute OT needs needed, will sign off.)  Elliot Cousin 08/15/2022, 10:55 AM

## 2022-08-15 NOTE — Progress Notes (Signed)
PT Cancellation Note  Patient Details Name: Sarah Walsh MRN: [id number] DOB: 05-28-1956   Cancelled Treatment:    Reason Eval/Treat Not Completed: Other (comment) (pt with transition to comfort care without further acute therapy needs will sign off)   Bama Hanselman B Chadwin Fury 08/15/2022, 9:43 AM [place] Office: [phone]

## 2022-08-15 NOTE — Progress Notes (Signed)
ANTICOAGULATION CONSULT NOTE - Follow Up Consult  Pharmacy Consult for heparin Indication:  multiple embolic strokes with paradoxical embolism and DVT  Labs: Recent Labs    08/13/22 0434 08/14/22 0533 08/15/22 0537  HGB 11.8* 12.6 12.1  HCT 35.3* 36.4 35.6*  PLT 341 379 364  HEPARINUNFRC 0.36 0.31 0.36  CREATININE 0.73 0.70 0.63     sodium chloride Stopped (08/11/22 0008)   heparin 1,100 Units/hr (08/14/22 1731)     Assessment: 66yo female on heparin with initial cautious dosing for paradoxical embolism and DVT in setting of multiple embolic strokes.   Heparin level therapeutic this AM (0.36).  No issues with infusion or bleeding per RN.  Goal of Therapy:  Heparin level 0.3-0.5 units/ml per neurology   Plan:  Cont heparin 1100 units/hr Heparin level daily with CBC daily Monitor for s/sx of bleeding Will follow if any plans for oral anticoagulation  Marguerite Olea, BCCP Clinical Pharmacist  08/15/2022 8:10 AM   [facility] pharmacy phone numbers are listed on [email or web address]

## 2022-08-15 NOTE — Progress Notes (Signed)
PROGRESS NOTE    Sarah Walsh  EFE:[id number] DOB: 26-Aug-1956 DOA: 08/02/2022 PCP: Sueanne Margarita, DO  Chief Complaint  Patient presents with   Altered Mental Status    Brief Narrative:  Sarah Walsh is [place] 66 y.o. female with Romuald Mccaslin history of T2DM, HLD, HTN, recent cervical LN biopsy with +lymphoma reportedly T cell, not yet started on treatment, presented to ED 10/10 with lethargy, confusion, anorexia, gait instability. MRI brain was positive for scattered cortical and subcortical infarcts. She was admitted, also noted to have fevers, WBC 83.2P with eosinophilic predominance.    Repeat MRI due to worsening level of consciousness showed extension of embolic shower strokes. EEG nonspecific without seizure recorded. Broad IV antibiotics have been continues empirically though no nidus of infection has been identified. Goals of care discussions are ongoing. Palliative care is consulted to assist.   Assessment & Plan:   Principal Problem:   Sepsis ([place]) Active Problems:   Essential hypertension   Encephalopathy acute   Lymphoma ([place])   DVT (deep venous thrombosis) (HCC)   Acute metabolic encephalopathy   Acute CVA (cerebrovascular accident) ([place])   Malnutrition of moderate degree   Acute respiratory failure with hypoxia (HCC)   Assessment and Plan: Goals of Care Appreciate palliative care assistance Plan for transition to comfort measures based on prior conversations today, lack of improvement from patient.  Appreciate palliative assistance.  Seizure Like Activity EEG without seizures or epileptiform discharges Appreciate additional neuro recs  Acute Hypoxic Respiratory Failure Increased wob today with diminished breath sounds, expiratory wheezing Prior CT showed no PE (10/11 imaging), but did show multiple enlarged mediastinal and hilar LN's and pulm nodules measuring up to 4.1 CM as well as mild hazy geographic ggo's in the lungs, possible edema or air trapping CXR 10/18 with  bilateral pulm nodules concerning for metastatic disease No true fevers, leukocytosis related to lymphoma Required bipap 10/18 PCCM consulted, now signed off.   S/p lasix, short course of steroids, trial of bipap. Now comfort measures   Sepsis: Patient met sepsis criteria on admission with tachycardia, fevers, leukocytosis, lactic acidosis, source unclear.   - afebrile at this point, leukocytosis related to lymphoma, low threshold to restart abx in setting of above, but lower suspicion for infectious cause at this point - bcx NGTD from 10/10. DDx fever includes due to lymphoma, central, less likely DVT in and of itself. - With negative cultures, completed ceftriaxone 2g q24h, vancomycin, flagyl x7 days (10/16). - Fungitell negative - Scheduled tylenol - Fever curve improved   Acute metabolic encephalopathy: Most likely related to widespread embolic CVAs.  - EEG with nonspecific changes without seizure activity noted initially. EEG repeated and unchanged 10/15. Anticipate some waxing/waning.  - Amantadine started per neurology for arousal - Limited neurological recovery at this time. Appreciate neurology recommendations.   Extensive bilateral cortical and subcortical ischemic CVAs: MRI brain showed numerous diffuse bilateral foci of abnormal diffusion restriction in [place] pattern most suggestive of embolic shower but could also indicate watershed ischemia.  - repeat MRI 10/13 showed extensive acute infarcts throughout both cerebral and cerebellar hemispheres across multiple vascular distributions again favored embolic (with ddx including watershed ischemia) - increased in extent from MRI done previously  - PFO found on TEE. In conjunction with know DVT (albeit distal) and embolic distribution, started heparin, goal level 0.3-0.5.  - Neuro checks to continue - tube feeds d/c'd - Comfort measures at this time   Dark urine: Due to bilirubinuria due to lymphoma. No  WBCs on repeat UA.    Metabolic  acidosis: No anion gap. Resolved.   Hypokalemia:  - improved   PSVT: Burden decreased on repeat tele review today.  - Convert IV to enteral metoprolol. BP tolerating.   HTN:  - BP goal per neurology, prn hydralazine ordered   Acute RLE peroneal DVT - CTA negative for PE.  - Heparin as discussed above.  - Interestingly, per Dr. Hadley Pen discussion with heme/onc, Dr. Lorenso Courier, lymphomas are not classically associated with hypercoagulability. The severity of leukocytosis is also not near the expected threshold to cause leukostasis.    T-cell lymphoma: Recent diagnosis, has not been started on treatment yet.   - Oncology, Dr. Lorenso Courier consulted. Patient is not currently Jeneva Schweizer candidate for treatment. No clear indication for pulse steroid per his evaluation.  At the time of his consult, he was concerned that she may never make substantial recovery from stroke to tolerate chemo. - There is no further work up or treatment currently planned as an inpatient for this issue.    Eosinophilic leukocytosis:  - suspect this is related to her T cell lymphoma. Note subtypes of T cell lymphomas can aberrantly express IL-5.  - Work up has not suggested nephritis, hepatitis, She has no history of this prior to diagnosis, no ongoing drug/allergic reaction, no parasitic infection detected. Could be expected at the time of her drug eruption after Tx for MRSA recently though that agent was stopped and rash has resolved durably. HIV NR - Will plan to monitor CBC w/differential.  Eos fluctuating.     DVT prophylaxis: heparin Code Status: DNR after conversation with palliative 10/19 Family Communication: daughter Disposition:   Status is: Inpatient Remains inpatient appropriate because: continued need for inpatient care   Consultants:  PCCM Oncology Neurology  Procedures:  TEE IMPRESSIONS     1. Left ventricular ejection fraction, by estimation, is 55 to 60%. The  left ventricle has normal function.   2.  Right ventricular systolic function is normal. The right ventricular  size is normal. There is moderately elevated pulmonary artery systolic  pressure.   3. No left atrial/left atrial appendage thrombus was detected.   4. The mitral valve is normal in structure. Mild mitral valve  regurgitation.   5. The aortic valve is tricuspid. Aortic valve regurgitation is not  visualized.   6. Evidence of atrial level shunting detected by color flow Doppler.  Agitated saline contrast bubble study was positive with shunting observed  within 3-6 cardiac cycles suggestive of interatrial shunt. Few bubbles  seen in left atrium after patient  coughed, suggesting small PFO. Small PFO confirmed visually with left to  right shunting.   7. No vegetation seen   Echo IMPRESSIONS     1. Left ventricular ejection fraction, by estimation, is 60 to 65%. The  left ventricle has normal function. The left ventricle has no regional  wall motion abnormalities. There is mild concentric left ventricular  hypertrophy. Left ventricular diastolic  parameters are consistent with Grade I diastolic dysfunction (impaired  relaxation).   2. Right ventricular systolic function is normal. The right ventricular  size is normal. Tricuspid regurgitation signal is inadequate for assessing  PA pressure.   3. The mitral valve is normal in structure. No evidence of mitral valve  regurgitation. No evidence of mitral stenosis.   4. The aortic valve is tricuspid. There is mild calcification of the  aortic valve. Aortic valve regurgitation is not visualized. No aortic  stenosis is present.  5. The inferior vena cava is normal in size with greater than 50%  respiratory variability, suggesting right atrial pressure of 3 mmHg.  LE [place] Summary:  RIGHT:  - There is no evidence of deep vein thrombosis in the lower extremity.     - No cystic structure found in the popliteal fossa.     - Ultrasound characteristics of enlarged lymph  nodes are noted in the  groin.     LEFT:  - Findings consistent with acute deep vein thrombosis involving the left  peroneal veins.     - No cystic structure found in the popliteal fossa.     - Ultrasound characteristics of enlarged lymph nodes noted in the groin.      Antimicrobials:  Anti-infectives (From admission, onward)    Start     Dose/Rate Route Frequency Ordered Stop   08/08/22 2100  cefTRIAXone (ROCEPHIN) 2 g in sodium chloride 0.9 % 100 mL IVPB        2 g 200 mL/hr over 30 Minutes Intravenous Every 24 hours 08/08/22 1818 08/09/22 0130   08/06/22 0200  vancomycin (VANCOCIN) IVPB 1000 mg/200 mL premix  Status:  Discontinued        1,000 mg 200 mL/hr over 60 Minutes Intravenous Every 12 hours 08/05/22 1934 08/08/22 1818   08/05/22 2100  cefTRIAXone (ROCEPHIN) 2 g in sodium chloride 0.9 % 100 mL IVPB  Status:  Discontinued        2 g 200 mL/hr over 30 Minutes Intravenous Every 24 hours 08/05/22 2029 08/08/22 1818   08/03/22 1000  metroNIDAZOLE (FLAGYL) IVPB 500 mg  Status:  Discontinued        500 mg 100 mL/hr over 60 Minutes Intravenous Every 12 hours 08/02/22 2206 08/08/22 1818   08/03/22 0600  ceFEPIme (MAXIPIME) 2 g in sodium chloride 0.9 % 100 mL IVPB  Status:  Discontinued        2 g 200 mL/hr over 30 Minutes Intravenous Every 8 hours 08/02/22 1918 08/05/22 2028   08/03/22 0330  vancomycin (VANCOREADY) IVPB 750 mg/150 mL  Status:  Discontinued        750 mg 150 mL/hr over 60 Minutes Intravenous Every 12 hours 08/02/22 1918 08/05/22 1934   08/02/22 1930  vancomycin (VANCOREADY) IVPB 1500 mg/300 mL        1,500 mg 150 mL/hr over 120 Minutes Intravenous  Once 08/02/22 1915 08/02/22 2207   08/02/22 1915  ceFEPIme (MAXIPIME) 2 g in sodium chloride 0.9 % 100 mL IVPB  Status:  Discontinued        2 g 200 mL/hr over 30 Minutes Intravenous  Once 08/02/22 1900 08/02/22 1918   08/02/22 1915  metroNIDAZOLE (FLAGYL) IVPB 500 mg        500 mg 100 mL/hr over 60 Minutes  Intravenous  Once 08/02/22 1900 08/02/22 2135   08/02/22 1915  vancomycin (VANCOCIN) IVPB 1000 mg/200 mL premix  Status:  Discontinued        1,000 mg 200 mL/hr over 60 Minutes Intravenous  Once 08/02/22 1900 08/02/22 1915       Subjective: Daughter at bedside with friend Doesn't say much today  Objective: Vitals:   08/15/22 0417 08/15/22 0627 08/15/22 0734 08/15/22 1213  BP: 136/77 135/76  132/72  Pulse: 100 99  99  Resp: (!) 22 (!) 200 20 (!) 21  Temp: 100.1 F (37.8 C) 100 F (37.8 C) 98.8 F (37.1 C) 99.7 F (37.6 C)  TempSrc: Axillary Axillary Oral Oral  SpO2: 93% 94% 94% 93%  Weight: 72.1 kg     Height:        Intake/Output Summary (Last 24 hours) at 08/15/2022 1905 Last data filed at 08/15/2022 1310 Gross per 24 hour  Intake 635.74 ml  Output 300 ml  Net 335.74 ml   Filed Weights   08/13/22 0456 08/14/22 0404 08/15/22 0417  Weight: 79.4 kg 76.4 kg 72.1 kg    Examination:  General: No acute distress. Lungs: unlabored on Walker Neurological: eyes open, attends to voice, not saying much today Extremities: No clubbing or cyanosis. No edema.   Data Reviewed: I have personally reviewed following labs and imaging studies  CBC: Recent Labs  Lab 08/11/22 0149 08/12/22 0200 08/13/22 0434 08/14/22 0533 08/15/22 0537  WBC 44.1* 46.7* 48.4* 71.2* 67.0*  NEUTROABS 18.5* 19.1* 12.2* 7.7 9.0*  HGB 11.5* 12.1 11.8* 12.6 12.1  HCT 34.1* 36.4 35.3* 36.4 35.6*  MCV 95.8 96.0 96.2 96.8 98.3  PLT 226 308 341 379 992    Basic Metabolic Panel: Recent Labs  Lab 08/10/22 0525 08/11/22 0149 08/12/22 0200 08/12/22 1640 08/13/22 0434 08/13/22 1749 08/14/22 0533 08/14/22 1752 08/15/22 0537  NA 137 135 138  --  138  --  140  --  138  K 3.5 4.4 4.2   < > 4.1 3.6 3.7 3.8 3.7  CL 104 99 100  --  96*  --  98  --  98  CO2 $Re'26 27 29  'MvF$ --  28  --  30  --  29  GLUCOSE 206* 192* 171*  --  123*  --  118*  --  132*  BUN $Re'16 16 18  'oZK$ --  26*  --  27*  --  23  CREATININE 0.62  0.62 0.68  --  0.73  --  0.70  --  0.63  CALCIUM 8.0* 8.0* 8.4*  --  8.6*  --  8.5*  --  8.6*  MG 2.1 2.0 2.4   < > 2.4 2.2 2.3 2.3 2.3  PHOS 1.9* 2.1* 2.8  --   --   --  2.8  --  3.0   < > = values in this interval not displayed.    GFR: Estimated Creatinine Clearance: 64.1 mL/min (by C-G formula based on SCr of 0.63 mg/dL).  Liver Function Tests: Recent Labs  Lab 08/09/22 0458 08/11/22 0149 08/12/22 0200 08/14/22 0533 08/15/22 0537  AST $Re'18 16 17 16 'Dxk$ 14*  ALT $Re'11 11 12 15 15  'KDA$ ALKPHOS 76 66 66 57 60  BILITOT 0.1* 0.4 0.6 0.5 0.4  PROT 5.7* 5.5* 5.8* 6.1* 6.3*  ALBUMIN 2.1* 2.0* 2.2* 2.3* 2.3*    CBG: Recent Labs  Lab 08/14/22 2051 08/15/22 0123 08/15/22 0415 08/15/22 0809 08/15/22 1216  GLUCAP 222* 139* 123* 189* 170*     No results found for this or any previous visit (from the past 240 hour(s)).        Radiology Studies: No results found.      Scheduled Meds:  acetaminophen  650 mg Per Tube Q6H   Or   acetaminophen  650 mg Rectal Q6H   sodium chloride flush  3 mL Intravenous Q12H   Continuous Infusions:  sodium chloride Stopped (08/11/22 0008)   morphine 1 mg/hr (08/15/22 1731)     LOS: 12 days    Time spent: over 30 min    Fayrene Helper, MD [facility]   To contact the attending provider between 7A-7P or the covering provider  during after hours 7P-7A, please log into the web site [email or web address] and access using universal [place] password for that web site. If you do not have the password, please call the hospital operator.  08/15/2022, 7:05 PM

## 2022-08-15 NOTE — Progress Notes (Signed)
Palliative:  HPI: 66 y.o. female  with past medical history of HTN, HLD, T2DM, anemia, arthritis, remote history of melanoma, and recent diagnosis of T-cell lymphoma admitted on 08/02/2022 with altered mental status.  MRI brain was positive for scattered cortical and subcortical infarcts.  Patient now with acute hypoxic respiratory failure requiring BiPAP.  Respiratory status improved with IV Lasix.  Patient with ongoing acute metabolic encephalopathy.  She occasionally verbalizes a few words.  She is unable to move her upper extremities.  Patient requiring core track for artificial nutrition.  Chest x-ray 10/18 with bilateral pulmonary nodules concerning for metastatic disease.  PMT consulted to discuss goals of care.  I met today at Monque's bedside along with daughter, [name], and close friend. Permission granted to speak openly. They shared fond stories of Sarah Walsh regarding her speedy driving and on the go personality. As we spoke Meryl appears to have some comprehension and nodding head yes/no appropriately. She is mostly non-verbal. [name] shares that they have had discussions as a family and with Sarah Walsh. They all understand that she desires quality of life over quantity of life. They are clear that she would not desire feeding tube or facility level care. They prefer comfort measures at this stage understanding prognosis is poor from cancer and significantly declined quality of life with stroke. We discussed comfort care and they desire removal of Cortrak, minimizing medications to comfort, opioid infusion. They fear pain from her cancer and shortness of breath as she had much shortness of breath previously. They do not want her to suffer. They do not desire transfer or hospice at this time but to reconsider after transition made for comfort care. We discussed that this is a decision most aligned with her values based on the few stories that they have shared today. [name] shares that the medical team have provided  them consistent reassurance of this decision. Dannia was able to nod her head yes and at one point mouths "thank you."   All questions/concerns addressed. Emotional support provided. Discussed with RN and Dr. Florene Glen.   Exam: Alert, unable to assess orientation. No distress at current time. Non-verbal. Breathing regular unlabored. Abd soft. Feet twitching (family report this is normal that she does not sit still).   Plan: - DNR - Transition to full comfort care.  - Morphine infusion for pain/shortness of breath per family request. Tatym had a daughter die from cancer who had much distress at end of life - they do not want Tanikka to go through this level of distress and want to ensure her comfort.   [place], NP Palliative Medicine Team Pager [phone] (Please see [email or web address] for schedule) Team Phone [phone]    Greater than 50%  of this time was spent counseling and coordinating care related to the above assessment and plan

## 2022-08-15 NOTE — Progress Notes (Signed)
Nutrition Brief Note  Chart reviewed. Pt transitioning to comfort care. Discontinuation of tube feeds and removal of Cortrak feeding tube has already occurred.    No further nutrition interventions planned at this time. Please re-consult as needed.   Hermina Barters RD, LDN Clinical Dietitian See Shea Evans for contact information.

## 2022-08-16 DIAGNOSIS — Z515 Encounter for palliative care: Secondary | ICD-10-CM | POA: Diagnosis not present

## 2022-08-16 DIAGNOSIS — A4102 Sepsis due to Methicillin resistant Staphylococcus aureus: Secondary | ICD-10-CM | POA: Diagnosis not present

## 2022-08-16 MED ORDER — KETOROLAC TROMETHAMINE 15 MG/ML IJ SOLN
15.0000 mg | Freq: Four times a day (QID) | INTRAMUSCULAR | Status: DC
  Administered 2022-08-16 – 2022-08-18 (×10): 15 mg via INTRAVENOUS
  Filled 2022-08-16 (×10): qty 1

## 2022-08-16 NOTE — Progress Notes (Signed)
PROGRESS NOTE    Sarah Walsh  KGU:[id number] DOB: 1955-10-31 DOA: 08/02/2022 PCP: Sueanne Margarita, DO  Chief Complaint  Patient presents with   Altered Mental Status    Brief Narrative:  BAYLOR TEEGARDEN is Chantal Worthey 66 y.o. female with Kielyn Kardell history of T2DM, HLD, HTN, recent cervical LN biopsy with +lymphoma reportedly T cell, not yet started on treatment, presented to ED 10/10 with lethargy, confusion, anorexia, gait instability. MRI brain was positive for scattered cortical and subcortical infarcts. She was admitted, also noted to have fevers, WBC 62.8B with eosinophilic predominance.    Repeat MRI due to worsening level of consciousness showed extension of embolic shower strokes. EEG nonspecific without seizure recorded. Broad IV antibiotics have been continues empirically though no nidus of infection has been identified. Goals of care discussions are ongoing. Palliative care is consulted to assist.   She was transitioned to comfort measures on 10/23.  Assessment & Plan:   Principal Problem:   Sepsis ([place]) Active Problems:   Essential hypertension   Encephalopathy acute   Lymphoma (HCC)   DVT (deep venous thrombosis) (HCC)   Acute metabolic encephalopathy   Acute CVA (cerebrovascular accident) ([place])   Malnutrition of moderate degree   Acute respiratory failure with hypoxia (HCC)   Assessment and Plan: Goals of Care Appreciate palliative care assistance Comfort measures 10/23 Appreciate palliative care assistance  Seizure Like Activity EEG without seizures or epileptiform discharges Appreciate additional neuro recs  Acute Hypoxic Respiratory Failure Increased wob today with diminished breath sounds, expiratory wheezing Prior CT showed no PE (10/11 imaging), but did show multiple enlarged mediastinal and hilar LN's and pulm nodules measuring up to 4.1 CM as well as mild hazy geographic ggo's in the lungs, possible edema or air trapping CXR 10/18 with bilateral pulm nodules  concerning for metastatic disease No true fevers, leukocytosis related to lymphoma Required bipap 10/18 PCCM consulted, now signed off.   S/p lasix, short course of steroids, trial of bipap. Now comfort measures   Sepsis: Patient met sepsis criteria on admission with tachycardia, fevers, leukocytosis, lactic acidosis, source unclear.   - afebrile at this point, leukocytosis related to lymphoma, low threshold to restart abx in setting of above, but lower suspicion for infectious cause at this point - bcx NGTD from 10/10. DDx fever includes due to lymphoma, central, less likely DVT in and of itself. - With negative cultures, completed ceftriaxone 2g q24h, vancomycin, flagyl x7 days (10/16). - Fungitell negative - Scheduled tylenol - Fever curve improved   Acute metabolic encephalopathy: Most likely related to widespread embolic CVAs.  - EEG with nonspecific changes without seizure activity noted initially. EEG repeated and unchanged 10/15. Anticipate some waxing/waning.  - Amantadine started per neurology for arousal - Limited neurological recovery at this time. Appreciate neurology recommendations.   Extensive bilateral cortical and subcortical ischemic CVAs: MRI brain showed numerous diffuse bilateral foci of abnormal diffusion restriction in Fathima Bartl pattern most suggestive of embolic shower but could also indicate watershed ischemia.  - repeat MRI 10/13 showed extensive acute infarcts throughout both cerebral and cerebellar hemispheres across multiple vascular distributions again favored embolic (with ddx including watershed ischemia) - increased in extent from MRI done previously  - PFO found on TEE. In conjunction with know DVT (albeit distal) and embolic distribution, started heparin, goal level 0.3-0.5.  - Neuro checks to continue - tube feeds d/c'd - Comfort measures at this time  Dark urine:  Metabolic acidosis:  Hypokalemia:  PSVT:  HTN:  Acute RLE  peroneal DVT T-cell  lymphoma: Eosinophilic leukocytosis:      DVT prophylaxis: heparin Code Status: DNR after conversation with palliative 10/19 Family Communication: ex husband Disposition:   Status is: Inpatient Remains inpatient appropriate because: continued need for inpatient care   Consultants:  PCCM Oncology Neurology  Procedures:  TEE IMPRESSIONS     1. Left ventricular ejection fraction, by estimation, is 55 to 60%. The  left ventricle has normal function.   2. Right ventricular systolic function is normal. The right ventricular  size is normal. There is moderately elevated pulmonary artery systolic  pressure.   3. No left atrial/left atrial appendage thrombus was detected.   4. The mitral valve is normal in structure. Mild mitral valve  regurgitation.   5. The aortic valve is tricuspid. Aortic valve regurgitation is not  visualized.   6. Evidence of atrial level shunting detected by color flow Doppler.  Agitated saline contrast bubble study was positive with shunting observed  within 3-6 cardiac cycles suggestive of interatrial shunt. Few bubbles  seen in left atrium after patient  coughed, suggesting small PFO. Small PFO confirmed visually with left to  right shunting.   7. No vegetation seen   Echo IMPRESSIONS     1. Left ventricular ejection fraction, by estimation, is 60 to 65%. The  left ventricle has normal function. The left ventricle has no regional  wall motion abnormalities. There is mild concentric left ventricular  hypertrophy. Left ventricular diastolic  parameters are consistent with Grade I diastolic dysfunction (impaired  relaxation).   2. Right ventricular systolic function is normal. The right ventricular  size is normal. Tricuspid regurgitation signal is inadequate for assessing  PA pressure.   3. The mitral valve is normal in structure. No evidence of mitral valve  regurgitation. No evidence of mitral stenosis.   4. The aortic valve is tricuspid.  There is mild calcification of the  aortic valve. Aortic valve regurgitation is not visualized. No aortic  stenosis is present.   5. The inferior vena cava is normal in size with greater than 50%  respiratory variability, suggesting right atrial pressure of 3 mmHg.  LE [place] Summary:  RIGHT:  - There is no evidence of deep vein thrombosis in the lower extremity.     - No cystic structure found in the popliteal fossa.     - Ultrasound characteristics of enlarged lymph nodes are noted in the  groin.     LEFT:  - Findings consistent with acute deep vein thrombosis involving the left  peroneal veins.     - No cystic structure found in the popliteal fossa.     - Ultrasound characteristics of enlarged lymph nodes noted in the groin.      Antimicrobials:  Anti-infectives (From admission, onward)    Start     Dose/Rate Route Frequency Ordered Stop   08/08/22 2100  cefTRIAXone (ROCEPHIN) 2 g in sodium chloride 0.9 % 100 mL IVPB        2 g 200 mL/hr over 30 Minutes Intravenous Every 24 hours 08/08/22 1818 08/09/22 0130   08/06/22 0200  vancomycin (VANCOCIN) IVPB 1000 mg/200 mL premix  Status:  Discontinued        1,000 mg 200 mL/hr over 60 Minutes Intravenous Every 12 hours 08/05/22 1934 08/08/22 1818   08/05/22 2100  cefTRIAXone (ROCEPHIN) 2 g in sodium chloride 0.9 % 100 mL IVPB  Status:  Discontinued        2 g 200 mL/hr over 30  Minutes Intravenous Every 24 hours 08/05/22 2029 08/08/22 1818   08/03/22 1000  metroNIDAZOLE (FLAGYL) IVPB 500 mg  Status:  Discontinued        500 mg 100 mL/hr over 60 Minutes Intravenous Every 12 hours 08/02/22 2206 08/08/22 1818   08/03/22 0600  ceFEPIme (MAXIPIME) 2 g in sodium chloride 0.9 % 100 mL IVPB  Status:  Discontinued        2 g 200 mL/hr over 30 Minutes Intravenous Every 8 hours 08/02/22 1918 08/05/22 2028   08/03/22 0330  vancomycin (VANCOREADY) IVPB 750 mg/150 mL  Status:  Discontinued        750 mg 150 mL/hr over 60 Minutes Intravenous  Every 12 hours 08/02/22 1918 08/05/22 1934   08/02/22 1930  vancomycin (VANCOREADY) IVPB 1500 mg/300 mL        1,500 mg 150 mL/hr over 120 Minutes Intravenous  Once 08/02/22 1915 08/02/22 2207   08/02/22 1915  ceFEPIme (MAXIPIME) 2 g in sodium chloride 0.9 % 100 mL IVPB  Status:  Discontinued        2 g 200 mL/hr over 30 Minutes Intravenous  Once 08/02/22 1900 08/02/22 1918   08/02/22 1915  metroNIDAZOLE (FLAGYL) IVPB 500 mg        500 mg 100 mL/hr over 60 Minutes Intravenous  Once 08/02/22 1900 08/02/22 2135   08/02/22 1915  vancomycin (VANCOCIN) IVPB 1000 mg/200 mL premix  Status:  Discontinued        1,000 mg 200 mL/hr over 60 Minutes Intravenous  Once 08/02/22 1900 08/02/22 1915       Subjective: Ex husband at bedside, denies any needs  Objective: Vitals:   08/16/22 0100 08/16/22 1215 08/16/22 1538 08/16/22 1819  BP: (!) 143/80  (!) 104/54   Pulse:   89   Resp: 20     Temp: 99 F (37.2 C) 99.1 F (37.3 C) 99 F (37.2 C) 98.6 F (37 C)  TempSrc: Oral Axillary Oral Oral  SpO2:   93%   Weight:      Height:        Intake/Output Summary (Last 24 hours) at 08/16/2022 1952 Last data filed at 08/16/2022 1706 Gross per 24 hour  Intake 28.58 ml  Output 800 ml  Net -771.42 ml   Filed Weights   08/13/22 0456 08/14/22 0404 08/15/22 0417  Weight: 79.4 kg 76.4 kg 72.1 kg    Examination:  Limited exam for comfort NAD, breathing with normal wob, eyes open  Data Reviewed: I have personally reviewed following labs and imaging studies  CBC: Recent Labs  Lab 08/11/22 0149 08/12/22 0200 08/13/22 0434 08/14/22 0533 08/15/22 0537  WBC 44.1* 46.7* 48.4* 71.2* 67.0*  NEUTROABS 18.5* 19.1* 12.2* 7.7 9.0*  HGB 11.5* 12.1 11.8* 12.6 12.1  HCT 34.1* 36.4 35.3* 36.4 35.6*  MCV 95.8 96.0 96.2 96.8 98.3  PLT 226 308 341 379 364    Basic Metabolic Panel: Recent Labs  Lab 08/10/22 0525 08/11/22 0149 08/12/22 0200 08/12/22 1640 08/13/22 0434 08/13/22 1749  08/14/22 0533 08/14/22 1752 08/15/22 0537  NA 137 135 138  --  138  --  140  --  138  K 3.5 4.4 4.2   < > 4.1 3.6 3.7 3.8 3.7  CL 104 99 100  --  96*  --  98  --  98  CO2 26 27 29   --  28  --  30  --  29  GLUCOSE 206* 192* 171*  --  123*  --  118*  --  132*  BUN $Re'16 16 18  'wKe$ --  26*  --  27*  --  23  CREATININE 0.62 0.62 0.68  --  0.73  --  0.70  --  0.63  CALCIUM 8.0* 8.0* 8.4*  --  8.6*  --  8.5*  --  8.6*  MG 2.1 2.0 2.4   < > 2.4 2.2 2.3 2.3 2.3  PHOS 1.9* 2.1* 2.8  --   --   --  2.8  --  3.0   < > = values in this interval not displayed.    GFR: Estimated Creatinine Clearance: 64.1 mL/min (by C-G formula based on SCr of 0.63 mg/dL).  Liver Function Tests: Recent Labs  Lab 08/11/22 0149 08/12/22 0200 08/14/22 0533 08/15/22 0537  AST $Re'16 17 16 'cpx$ 14*  ALT $Re'11 12 15 15  'BAz$ ALKPHOS 66 66 57 60  BILITOT 0.4 0.6 0.5 0.4  PROT 5.5* 5.8* 6.1* 6.3*  ALBUMIN 2.0* 2.2* 2.3* 2.3*    CBG: Recent Labs  Lab 08/14/22 2051 08/15/22 0123 08/15/22 0415 08/15/22 0809 08/15/22 1216  GLUCAP 222* 139* 123* 189* 170*     No results found for this or any previous visit (from the past 240 hour(s)).        Radiology Studies: No results found.      Scheduled Meds:  acetaminophen  650 mg Per Tube Q6H   Or   acetaminophen  650 mg Rectal Q6H   ketorolac  15 mg Intravenous Q6H   sodium chloride flush  3 mL Intravenous Q12H   Continuous Infusions:  sodium chloride Stopped (08/11/22 0008)   morphine 1.5 mg/hr (08/16/22 1706)     LOS: 13 days    Time spent: over 30 min    Fayrene Helper, MD [facility]   To contact the attending provider between 7A-7P or the covering provider during after hours 7P-7A, please log into the web site [email or web address] and access using universal [place] password for that web site. If you do not have the password, please call the hospital operator.  08/16/2022, 7:52 PM

## 2022-08-16 NOTE — Progress Notes (Signed)
Palliative:  HPI: 66 y.o. female  with past medical history of HTN, HLD, T2DM, anemia, arthritis, remote history of melanoma, and recent diagnosis of T-cell lymphoma admitted on 08/02/2022 with altered mental status.  MRI brain was positive for scattered cortical and subcortical infarcts.  Patient now with acute hypoxic respiratory failure requiring BiPAP.  Respiratory status improved with IV Lasix.  Patient with ongoing acute metabolic encephalopathy.  She occasionally verbalizes a few words.  She is unable to move her upper extremities.  Patient requiring core track for artificial nutrition.  Chest x-ray 10/18 with bilateral pulmonary nodules concerning for metastatic disease.  PMT consulted to discuss goals of care.  I met today at [facility] bedside. I visited while no visitors present - breathing slightly labored so morphine bolus via infusion provided. She is resting well on morphine infusion with husband at bedside later and joined by daughter, [name], and friend. Family continues to agree with plan of care and comfort and allowing peaceful death. We discussed removal of oxygen and they agree. They have no concerns with plan of care at this stage. Emotional support provided. Discussed plan with RN.   Plan: - DNR, full comfort care - Toradol q6h for fever/pain.  - Continue morphine infusion.  - If stable tomorrow may need to reconsider hospice placement.   25 min  Vinie Sill, NP Palliative Medicine Team Pager [phone] (Please see [email or web address] for schedule) Team Phone [phone]    Greater than 50%  of this time was spent counseling and coordinating care related to the above assessment and plan

## 2022-08-17 DIAGNOSIS — G9341 Metabolic encephalopathy: Secondary | ICD-10-CM | POA: Diagnosis not present

## 2022-08-17 DIAGNOSIS — A4102 Sepsis due to Methicillin resistant Staphylococcus aureus: Secondary | ICD-10-CM | POA: Diagnosis not present

## 2022-08-17 DIAGNOSIS — Z7189 Other specified counseling: Secondary | ICD-10-CM | POA: Diagnosis not present

## 2022-08-17 DIAGNOSIS — G9340 Encephalopathy, unspecified: Secondary | ICD-10-CM | POA: Diagnosis not present

## 2022-08-17 DIAGNOSIS — Z515 Encounter for palliative care: Secondary | ICD-10-CM | POA: Diagnosis not present

## 2022-08-17 DIAGNOSIS — I639 Cerebral infarction, unspecified: Secondary | ICD-10-CM | POA: Diagnosis not present

## 2022-08-17 DIAGNOSIS — E440 Moderate protein-calorie malnutrition: Secondary | ICD-10-CM

## 2022-08-17 NOTE — Progress Notes (Signed)
Progress Note  Patient: Sarah Walsh TML:[id number] DOB: 1956-10-24  DOA: 08/02/2022  DOS: 08/17/2022    Brief hospital course: Sarah Walsh is a 66 y.o. female with a history of T2DM, HLD, HTN, recent cervical LN biopsy with +lymphoma reportedly T cell, not yet started on treatment, presented to ED 10/10 with lethargy, confusion, anorexia, gait instability. MRI brain was positive for scattered cortical and subcortical infarcts. She was admitted, also noted to have fevers, WBC 68.1E with eosinophilic predominance.   Repeat MRI due to worsening level of consciousness showed extension of embolic shower strokes. EEG nonspecific without seizure recorded. Broad IV antibiotics have been continues empirically though no nidus of infection has been identified. Goals of care discussions are ongoing. With limited/minimal neurological improvement, the patient's family have confirmed the desire to pursue comfort measures only as of 10/23. Morphine infusion begun 10/24.   Assessment and Plan: Sepsis is ruled out: Patient met sepsis criteria on admission with tachycardia, fevers, leukocytosis, lactic acidosis, though source is unclear and findings are felt most likely attributable to stroke, lymphoma. Completed empiric antibiotics, though with undetectable PCT and negative fungitell, negative cultures.  - Continue to treat fevers (likely due to stroke and/or tumor burden).    Acute metabolic encephalopathy: Most likely related to widespread embolic CVAs.  - EEG with nonspecific changes without seizure activity noted initially. EEG repeated and unchanged 10/15. Anticipate some waxing/waning.  - Amantadine started per neurology for arousal - Limited neurological recovery at this time. Based on discussions with neurology, recovery is unlikely. Pursuing comfort-oriented care at this time.   Acute hypoxic respiratory failure:  - Morphine gtt prn air hunger. No indication for supplemental oxygen.   Extensive  bilateral cortical and subcortical ischemic CVAs: MRI brain showed numerous diffuse bilateral foci of abnormal diffusion restriction in a pattern most suggestive of embolic shower but could also indicate watershed ischemia. Due to worsening lethargy and exam, repeat MRI ordered today and confirms worsening severity of infarct burden.  - PFO found on TEE. In conjunction with know DVT (albeit distal) and embolic distribution, started heparin. Converted off due to hospice care. - Cortrak inserted 10/16, discontinued per [place] discussions.   Dark urine: Due to bilirubinuria due to lymphoma. No WBCs on repeat UA.   Metabolic acidosis: No anion gap. Resolved.  Hypokalemia: Supplemented  PSVT: No further monitoring/Tx planned.   HTN: No further checking/treating.   Acute RLE peroneal DVT: This is distal and asymptomatic, so anticoagulation is not needed.    T-cell lymphoma: Recent diagnosis, has not been started on treatment yet. The patient is not currently a candidate for treatment and, given her normal cell counts otherwise, doubt there'd be any benefit to pulsed steroids at this time.   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 Note subtypes of T cell lymphomas can aberrantly express IL-5.   Subjective: Appropriately answers some, but not all, questions. Says she's not in pain and doesn't have any trouble breathing. No family at bedside on my evaluation.   Objective: Vitals:   08/16/22 1538 08/16/22 1819 08/16/22 2054 08/17/22 1115  BP: (!) 104/54  116/71 (!) 141/84  Pulse: 89  (!) 102 (!) 107  Resp:   20 20  Temp: 99 F (37.2 C) 98.6 F (37 C) 98.5 F (36.9 C) (!) 97.4 F (36.3 C)  TempSrc: Oral Oral Oral Axillary  SpO2: 93%  (!) 84% 90%  Weight:  Height:      Gen: 66 y.o. female in no distress, appears thin/frail Pulm: Nonlabored breathing room air CV: Regular without JVD or dependent edema. GI: Abdomen  soft, non-tender, non-distended  Ext: Warm, dry, no deformities Skin: No rashes, lesions or ulcers on visualized skin. Neuro: Alert, moves eyes some but still doesn't seem to track. Does not follow commands   Family Communication: None at bedside this AM  Disposition: Status is: Inpatient Remains inpatient appropriate because: Critical illness, may consider transfer to residential hospice depending on family wishes.  Planned Discharge Destination: Hospital demise vs. residential hospice.   Patrecia Pour, MD 08/17/2022 1:02 PM Page by [email or web address]

## 2022-08-17 NOTE — TOC Progression Note (Signed)
Transition of Care [facility]) - Progression Note    Patient Details  Name: Sarah Walsh MRN: [id number] Date of Birth: [birth year]  Transition [facility] Care [facility]) CM/SW Contact  Graves-Bigelow, Ocie Cornfield, RN Phone Number: 08/17/2022, 1:39 PM  Clinical Narrative: Case Manager received a secure chat regarding possible transition to [facility]. Case Manager called Liaison Nicholaus Corolla to see if the patient is appropriate for inpatient hospice facility. Case Manager will continue to follow for transition of care needs.    Expected Discharge Plan: [place] Barriers to Discharge: Continued Medical Work up  Expected Discharge Plan and Services Expected Discharge Plan: [place] In-house Referral: Clinical Social Work Discharge Planning Services: CM Consult   Readmission Risk Interventions     No data to display

## 2022-08-17 NOTE — Progress Notes (Signed)
Palliative:  HPI: 66 y.o. female  with past medical history of HTN, HLD, T2DM, anemia, arthritis, remote history of melanoma, and recent diagnosis of T-cell lymphoma admitted on 08/02/2022 with altered mental status.  MRI brain was positive for scattered cortical and subcortical infarcts.  Patient now with acute hypoxic respiratory failure requiring BiPAP.  Respiratory status improved with IV Lasix.  Patient with ongoing acute metabolic encephalopathy.  She occasionally verbalizes a few words.  She is unable to move her upper extremities.  Patient requiring core track for artificial nutrition.  Chest x-ray 10/18 with bilateral pulmonary nodules concerning for metastatic disease.  PMT consulted to discuss goals of care.  I met today again at [facility] bedside. She has her daughter, son, and other family at bedside. Sarah Walsh continues to be awake and alert at times and even makes small attempts to communicate with 2-3 words. She continues to be flushed/pink but denies pain or discomfort. Family report that she has remained comfortable and without distress. I discussed with them that she has not had significant change or decline and this may be the time to consider transition to hospice facility. Family at bedside agree to evaluation with [facility]. They would prefer hospital death but understand that hospice would be a good option and take good care of her as well. They are very reasonable and continue to enjoy their moments and time with Sarah Walsh.   All questions/concerns addressed. Emotional support provided.   Exam: Lethargic. Tracks. Extremely weak and fatigued. Attempts to communicate although more lip-reading as too weak to make much sound. BLE edema. Warm to touch. Breathing regular, unlabored. Abd soft.   Plan:  - DNR, full comfort care - Toradol q6h for fever/pain.  - Continue morphine infusion - increased slightly yesterday evening and anticipate need for ongoing adjustments as she progresses.  -  [place] referral. Prognosis < 2 weeks.   [place], NP Palliative Medicine Team Pager [phone] (Please see [email or web address] for schedule) Team Phone [phone]    Greater than 50%  of this time was spent counseling and coordinating care related to the above assessment and plan

## 2022-08-18 DIAGNOSIS — G9340 Encephalopathy, unspecified: Secondary | ICD-10-CM | POA: Diagnosis not present

## 2022-08-18 DIAGNOSIS — Z7189 Other specified counseling: Secondary | ICD-10-CM | POA: Diagnosis not present

## 2022-08-18 DIAGNOSIS — Z515 Encounter for palliative care: Secondary | ICD-10-CM | POA: Diagnosis not present

## 2022-08-18 DIAGNOSIS — J9601 Acute respiratory failure with hypoxia: Secondary | ICD-10-CM | POA: Diagnosis not present

## 2022-08-18 DIAGNOSIS — G9341 Metabolic encephalopathy: Secondary | ICD-10-CM | POA: Diagnosis not present

## 2022-08-18 DIAGNOSIS — I639 Cerebral infarction, unspecified: Secondary | ICD-10-CM | POA: Diagnosis not present

## 2022-08-18 MED ORDER — ACETAMINOPHEN 650 MG RE SUPP: 650.0000 mg | Freq: Four times a day (QID) | RECTAL | Status: AC

## 2022-08-18 MED ORDER — KETOROLAC TROMETHAMINE 15 MG/ML IJ SOLN: 15.0000 mg | Freq: Four times a day (QID) | INTRAMUSCULAR | Status: AC

## 2022-08-18 MED ORDER — SODIUM CHLORIDE 0.9 % IV SOLN: 0.2500 mg/h | INTRAVENOUS | Status: AC

## 2022-08-18 NOTE — Progress Notes (Signed)
Palliative:  HPI: 65 y.o. female  with past medical history of HTN, HLD, T2DM, anemia, arthritis, remote history of melanoma, and recent diagnosis of T-cell lymphoma admitted on 08/02/2022 with altered mental status.  MRI brain was positive for scattered cortical and subcortical infarcts.  Patient now with acute hypoxic respiratory failure requiring BiPAP.  Respiratory status improved with IV Lasix.  Patient with ongoing acute metabolic encephalopathy.  She occasionally verbalizes a few words.  She is unable to move her upper extremities.  Patient requiring core track for artificial nutrition.  Chest x-ray 10/18 with bilateral pulmonary nodules concerning for metastatic disease.  PMT consulted to discuss goals of care.  I met today at [facility] bedside. No significant changes in status. She continues to appear comfortable but flushed. She opens eyes and looks around. She moves mouth and able to mouth words at times but no sound coming out. Ex-husband, Lanny Hurst, is at bedside along with daughter, Yong Channel, who recently arrived from [place]. Yong Channel expresses some concern about going to hospice which I attempted to address - no changes to plan. All other family agree with moving towards transfer to [facility] planned for today. Spent some time discussing with Lanny Hurst his grief and answered questions and provided emotional support. Son, [name], arrived to bedside and no questions/concerns.   Discussed plans with Dr. Bonner Puna, [facility], hospice liaison.   Exam: Lethargic. Tracks. Extremely weak and fatigued. Attempts to communicate although more lip-reading as too weak to make much sound. BLE edema. Warm to touch. Breathing regular, unlabored. Abd soft.   Plan: - DNR, full comfort care - Toradol q6h for fever/pain.  - Continue morphine infusion - increased slightly yesterday evening and anticipate need for ongoing adjustments as she progresses. This will transition to dilaudid infusion at [facility] and recommending  dilaudid 0.25 mg/hr with 0.25 mg bolus every 15 min PRN.  - [place] referral. Prognosis < 2 weeks.   [place], NP Palliative Medicine Team Pager [phone] (Please see [email or web address] for schedule) Team Phone [phone]    Greater than 50%  of this time was spent counseling and coordinating care related to the above assessment and plan

## 2022-08-18 NOTE — TOC Transition Note (Signed)
Transition of Care [facility]) - CM/SW Discharge Note   Patient Details  Name: Sarah Walsh MRN: [id number] Date of Birth: 12-14-1955  Transition of Care [facility]) CM/SW Contact:  Milas Gain, [place] Phone Number: 08/18/2022, 3:03 PM   Clinical Narrative:     Patient will DC [id number]   Anticipated DC date: 08/18/2022  Family notified: [name]   Transport by: Corey Harold  ?  Per MD patient ready for DC to [facility] . RN, patient, patient's family, Judson Roch with [place] facility notified of DC.  RN given number for report [phone] RM#7. DC packet on chart.DNR signed by MD attached to patients DC packet. Ambulance transport requested for patient.  CSW signing off.   Final next level of care: [place] ([place]) Barriers to Discharge: No Barriers Identified   Patient Goals and CMS Choice   CMS Medicare.gov Compare Post Acute Care list provided to:: Patient Represenative (must comment) (patients daughter [name]) Choice offered to / list presented to : Adult Children (daughter [name])  Discharge Placement              Patient chooses bed at:  [facility]) Patient to be transferred to facility by: [facility] Name of family member notified: [name] Patient and family notified of of transfer: 08/18/22  Discharge Plan and Services In-house Referral: Clinical Social Work Discharge Planning Services: CM Consult                                 Social Determinants of Health (SDOH) Interventions     Readmission Risk Interventions     No data to display

## 2022-08-18 NOTE — Discharge Summary (Signed)
Physician Discharge Summary   Patient: Sarah Walsh MRN: [id number] DOB: [birth year]  Admit date:     08/02/2022  Discharge date: 08/18/22  Discharge Physician: Patrecia Pour   PCP: Sueanne Margarita, DO   Recommendations at discharge:  Comfort measures  Discharge Diagnoses: Principal Problem:   Sepsis [facility]) Active Problems:   Essential hypertension   Encephalopathy acute   Lymphoma ([place])   DVT (deep venous thrombosis) ([place])   Acute metabolic encephalopathy   Acute CVA (cerebrovascular accident) ([place])   Malnutrition of moderate degree   Acute respiratory failure with hypoxia [facility])   Hospice care patient  Hospital Course: Sarah Walsh is a 66 y.o. female with a history of T2DM, HLD, HTN, recent cervical LN biopsy with +lymphoma reportedly T cell, not yet started on treatment, presented to ED 10/10 with lethargy, confusion, anorexia, gait instability. MRI brain was positive for scattered cortical and subcortical infarcts. She was admitted, also noted to have fevers, WBC 85.6D with eosinophilic predominance.    Repeat MRI due to worsening level of consciousness showed extension of embolic shower strokes. EEG nonspecific without seizure recorded. Broad IV antibiotics have been continues empirically though no nidus of infection has been identified. Goals of care discussions are ongoing. With limited/minimal neurological improvement, the patient's family have confirmed the desire to pursue comfort measures only as of 10/23. Morphine infusion begun 10/24.   Assessment and Plan: Sepsis is ruled out: Patient met sepsis criteria on admission with tachycardia, fevers, leukocytosis, lactic acidosis, though source is unclear and findings are felt most likely attributable to stroke, lymphoma. Completed empiric antibiotics, though with undetectable PCT and negative fungitell, negative cultures.  - Continue to treat fevers (likely due to stroke and/or tumor burden) with toradol and tylenol.   Acute  metabolic encephalopathy: Most likely related to widespread embolic CVAs.  - EEG with nonspecific changes without seizure activity noted initially. EEG repeated and unchanged 10/15. Anticipate some waxing/waning.  - Amantadine started per neurology for arousal, subsequently stopped/ineffective. - Very limited neurological recovery. Based on discussions with neurology, recovery is unlikely. Pursuing comfort-oriented care at this time.    Acute hypoxic respiratory failure:  - Morphine gtt started, will transition to dilaudid gtt due to availability at hospice. 0.$RemoveBefor'25mg'oqSEJcsHwrko$ /hr suggested initial dosing with prn boluses per hospice MD.    Extensive bilateral cortical and subcortical ischemic CVAs: MRI brain showed numerous diffuse bilateral foci of abnormal diffusion restriction in a pattern most suggestive of embolic shower but could also indicate watershed ischemia. Due to worsening lethargy and exam, repeat MRI ordered today and confirms worsening severity of infarct burden.  - PFO found on TEE. In conjunction with know DVT (albeit distal) and embolic distribution, started heparin. Converted off due to hospice care. - Cortrak inserted 10/16, discontinued per [place] discussions.    Dark urine: Due to bilirubinuria due to lymphoma. No WBCs on repeat UA.    Metabolic acidosis: No anion gap. Resolved.   Hypokalemia: Supplemented   PSVT: No further monitoring/Tx planned.   HTN: No further checking/treating.   Acute RLE peroneal DVT: This is distal and asymptomatic, so anticoagulation is not needed.    T-cell lymphoma: Recent diagnosis, has not been started on treatment yet. The patient is not currently a candidate for treatment and, given her normal cell counts otherwise, doubt there'd be any benefit to pulsed steroids at this time.   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  Note subtypes of T cell lymphomas can aberrantly express  IL-5.   Consultants: PCCM, oncology, neurology, palliative care Procedures performed: EEG  Disposition: Hospice care Diet recommendation: De facto NPO DISCHARGE MEDICATION: Allergies as of 08/18/2022       Reactions   Statins Other (See Comments)   myalgia Other reaction(s): Other (See Comments) Other Reaction(s): Other myalgia Other Reaction(s): Other  myalgia   Codeine Itching, Other (See Comments)        Medication List     STOP taking these medications    B COMPLEX PO   cetirizine 10 MG tablet Commonly known as: ZyrTEC Allergy   Cholecalciferol 25 MCG (1000 UT) tablet   diclofenac 75 MG EC tablet Commonly known as: VOLTAREN   diphenhydrAMINE 25 MG tablet Commonly known as: BENADRYL   ezetimibe 10 MG tablet Commonly known as: ZETIA   FLUoxetine 10 MG capsule Commonly known as: PROZAC   gabapentin 300 MG capsule Commonly known as: NEURONTIN   hydrochlorothiazide 12.5 MG capsule Commonly known as: MICROZIDE   ibuprofen 200 MG tablet Commonly known as: ADVIL   magnesium oxide 400 MG tablet Commonly known as: MAG-OX   metFORMIN 500 MG tablet Commonly known as: GLUCOPHAGE   montelukast 10 MG tablet Commonly known as: Singulair   Potassium 99 MG Tabs   solifenacin 5 MG tablet Commonly known as: VESICARE   Systane Complete 0.6 % Soln Generic drug: Propylene Glycol   triamcinolone cream 0.1 % Commonly known as: KENALOG   VITAMIN C PO       TAKE these medications    acetaminophen 650 MG suppository Commonly known as: TYLENOL Place 1 suppository (650 mg total) rectally every 6 (six) hours.   HYDROmorphone 25 mg in sodium chloride 0.9 % 47.5 mL Inject 0.25 mg/hr into the vein continuous.   ketorolac 15 MG/ML injection Commonly known as: TORADOL Inject 1 mL (15 mg total) into the vein every 6 (six) hours.        Follow-up Information     Sueanne Margarita, DO Follow up.   Specialty: Internal Medicine Why: Included as CC for  discharge summary. No PCP follow up needed. Contact information: [place]  [address] [phone]                Discharge Exam: [place] Weights   08/13/22 0456 08/14/22 0404 08/15/22 0417  Weight: 79.4 kg 76.4 kg 72.1 kg  BP 135/71 (BP Location: Left Arm)   Pulse (!) 113   Temp (!) 97.3 F (36.3 C) (Axillary)   Resp 16  Calm, no distress Unchanged minimally responsive Respirations are even, nonlabored, not agonal.  Regular tachycardia which is improved this AM after toradol/tylenol.  Condition at discharge:  Stable currently for transport to residential hospice, prognosis limited to hours-days.  The results of significant diagnostics from this hospitalization (including imaging, microbiology, ancillary and laboratory) are listed below for reference.   Imaging Studies: EEG adult  Result Date: 08-16-2022 Lora Havens, MD     16-Aug-2022  3:36 PM Patient Name: KJERSTEN ORMISTON MRN: [id number] Epilepsy Attending: Lora Havens Referring Physician/Provider: Elodia Florence., MD Date: 16-Aug-2022 Duration: 27.03 mins Patient history: 66yo F with ams. EEG to evaluate for seizure Level of alertness: Awake AEDs during EEG study: None Technical aspects: This EEG study was done with scalp electrodes positioned according to the 10-20 International system of electrode placement. Electrical activity was reviewed with band pass filter of 1-$RemoveBef'70Hz'nkDRBDeLGg$ , sensitivity of 7 uV/mm, display speed of  53mm/sec with a $Remo'60Hz'PXYxa$  notched filter applied as appropriate. EEG data were recorded continuously and digitally stored.  Video monitoring was available and reviewed as appropriate. Description: EEG showed continuous generalized 3 to 6 Hz theta-delta slowing. Hyperventilation and photic stimulation were not performed.   Of note, study was technically difficult due to significant chewing artifact. ABNORMALITY - Continuous slow, generalized IMPRESSION: This technically difficult study is  suggestive of moderate diffuse encephalopathy, nonspecific etiology. No seizures or epileptiform discharges were seen throughout the recording. Lora Havens   DG CHEST PORT 1 VIEW  Result Date: 08/10/2022 CLINICAL DATA:  Cough. EXAM: PORTABLE CHEST 1 VIEW COMPARISON:  August 06, 2022. FINDINGS: Stable cardiomediastinal silhouette. Feeding tube is seen entering stomach. Bilateral pulmonary nodules are noted concerning for metastatic disease. Mild left basilar subsegmental atelectasis is noted with small left pleural effusion. Bony thorax is unremarkable. IMPRESSION: Bilateral pulmonary nodules are again noted concerning for metastatic disease. Mild left basilar subsegmental atelectasis is noted with small left pleural effusion. Electronically Signed   By: Marijo Conception M.D.   On: 08/10/2022 12:38   DG Abd Portable 1V  Result Date: 08/08/2022 CLINICAL DATA:  Feeding tube placement EXAM: PORTABLE ABDOMEN - 1 VIEW COMPARISON:  CT abdomen 05/29/2006 FINDINGS: Partially visualized bilateral pulmonary nodules. Bilateral hilar lymphadenopathy. Feeding tube with the tip projecting over the antrum of the stomach. No bowel dilatation to suggest obstruction. No evidence of pneumoperitoneum, portal venous gas or pneumatosis. No pathologic calcifications along the expected course of the ureters. No acute osseous abnormality. IMPRESSION: 1. Feeding tube with the tip projecting over the antrum of the stomach. 2. Redemonstrated bilateral hilar lymphadenopathy and bilateral pulmonary nodules. Electronically Signed   By: Kathreen Devoid M.D.   On: 08/08/2022 13:00   EEG adult  Result Date: 08/07/2022 Derek Jack, MD     08/07/2022 12:34 PM Patient Name: LYLIAN SANAGUSTIN MRN: [id number] Epilepsy Attending: Su Monks MD Referring Physician/Provider: Donnetta Simpers, MD Date: 08/07/2022 Duration: 24.09 min  Patient history: 66 year old female with alteration in mental status. EEG to evaluate for seizure  Level  of alertness:  lethargic  AEDs during EEG study: None  Technical aspects: This EEG study was done with scalp electrodes positioned according to the 10-20 International system of electrode placement. Electrical activity was reviewed with band pass filter of 1-$RemoveBef'70Hz'ZovdtGkHki$ , sensitivity of 7 uV/mm, display speed of 71mm/sec with a $Remo'60Hz'DQOrL$  notched filter applied as appropriate. EEG data were recorded continuously and digitally stored.  Video monitoring was available and reviewed as appropriate.  Description: EEG showed continuous generalized 3 to 6 Hz theta-delta slowing. Generalized periodic discharges with triphasic morphology at  1.5-$Remov'2Hz'GWhgpS$  were also noted, more prominent when awake/stimulated. Hyperventilation and photic stimulation were not performed.    ABNORMALITY - Periodic discharges with triphasic morphology, generalized ( GPDs) - Continuous slow, generalized  IMPRESSION: This study showed generalized periodic discharges with triphasic morphology which can be on the ictal-interictal continuum. However, the morphology, frequency and reactivity to stimulation is more commonly indicative of toxic-metabolic causes. Additionally there is moderate to severe diffuse encephalopathy, nonspecific etiology. No seizures were seen throughout the recording. This EEG is unchanged compared to yesterday. Su Monks, MD [facility] [phone] If 7pm- 7am, please page neurology on call as listed in [place].   DG CHEST PORT 1 VIEW  Result Date: 08/06/2022 CLINICAL DATA:  Acute respiratory failure with hypoxia EXAM: PORTABLE CHEST 1 VIEW COMPARISON:  Chest x-ray 08/02/2022.  Chest CT 08/03/2022. FINDINGS: Bilateral pulmonary nodular densities are  again noted and similar to the prior study. There is increased central pulmonary vascular congestion. The cardiomediastinal silhouette appears stable. There is no pneumothorax or acute fracture. IMPRESSION: 1. Increased central pulmonary vascular congestion. 2. Stable bilateral  pulmonary nodules. Electronically Signed   By: Ronney Asters M.D.   On: 08/06/2022 17:33   EEG adult  Result Date: 08/06/2022 Lora Havens, MD     08/06/2022  8:43 AM Patient Name: CRYSTLE CARELLI MRN: [id number] Epilepsy Attending: Lora Havens Referring Physician/Provider: Donnetta Simpers, MD Date: 08/06/2022 Duration: 23.12 mins Patient history: 66 year old female with alteration in mental status. EEG to evaluate for seizure Level of alertness:  lethargic AEDs during EEG study: None Technical aspects: This EEG study was done with scalp electrodes positioned according to the 10-20 International system of electrode placement. Electrical activity was reviewed with band pass filter of 1-$RemoveBef'70Hz'PgTrXcUEMd$ , sensitivity of 7 uV/mm, display speed of 41mm/sec with a $Remo'60Hz'DhMbC$  notched filter applied as appropriate. EEG data were recorded continuously and digitally stored.  Video monitoring was available and reviewed as appropriate. Description: EEG showed continuous generalized 3 to 6 Hz theta-delta slowing. Generalized periodic discharges with triphasic morphology at  1.5-$Remov'2Hz'OYeGBr$  were also noted, more prominent when awake/stimulated. Hyperventilation and photic stimulation were not performed.   ABNORMALITY - Periodic discharges with triphasic morphology, generalized ( GPDs) - Continuous slow, generalized IMPRESSION: This study showed generalized periodic discharges with triphasic morphology which can be on the ictal-interictal continuum. However, the morphology, frequency and reactivity to stimulation is more commonly indicative of toxic-metabolic causes. Additionally there is moderate to severe diffuse encephalopathy, nonspecific etiology. No seizures were seen throughout the recording. Lora Havens   MR BRAIN WO CONTRAST  Result Date: 08/05/2022 CLINICAL DATA:  History of stroke, mental status change EXAM: MRI HEAD WITHOUT CONTRAST TECHNIQUE: Multiplanar, multiecho pulse sequences of the brain and surrounding  structures were obtained without intravenous contrast. COMPARISON:  CT head 1 day prior, brain MRI 08/02/2022 FINDINGS: Brain: Again seen are extensive acute infarcts throughout both cerebral and cerebellar hemispheres across bilateral ACA, MCA, and PCA distributions, overall increased in extent compared to the MRI from 3 days prior. There is associated FLAIR signal abnormality and a few punctate foci of SWI signal dropout suspicious for petechial hemorrhage but no organized hematoma or mass effect. The foci of intrinsic T1 hyperintensity in the occipital lobes may be due to petechial hemorrhage. There is no acute intracranial hemorrhage or extra-axial fluid collection Background parenchymal volume is normal. The ventricles are stable in size. There is no mass lesion. There is no mass effect or midline shift. Vascular: Normal flow voids. Skull and upper cervical spine: Normal marrow signal. Sinuses/Orbits: The paranasal sinuses are clear. The globes and orbits are unremarkable. Other: None. IMPRESSION: Extensive acute infarcts throughout both cerebral and cerebellar hemispheres across multiple vascular distributions again favored embolic with differential including watershed ischemia, increased in extent since the MRI from 3 days prior. Suspect some petechial hemorrhage but no hematoma or mass effect. Electronically Signed   By: Valetta Mole M.D.   On: 08/05/2022 19:45   ECHO TEE  Result Date: 08/05/2022    TRANSESOPHOGEAL ECHO REPORT   Patient Name:   BAILEE METTER Date of Exam: 08/05/2022 Medical Rec #:  [id number]     Height:       63.0 in Accession #:    [id number]    Weight:       160.0 lb Date of Birth:  [birth year]     BSA:  1.759 m Patient Age:    69 years      BP:           125/78 mmHg Patient Gender: F             HR:           99 bpm. Exam Location:  Inpatient Procedure: Transesophageal Echo, 3D Echo, Color Doppler, Cardiac Doppler and            Saline Contrast Bubble Study Indications:      Stroke i63.9  History:         Patient has prior history of Echocardiogram examinations, most                  recent 08/03/2022. Risk Factors:Hypertension, Diabetes and                  Dyslipidemia.  Sonographer:     Raquel Sarna Senior RDCS Referring Phys:  [id number] Margie Billet Diagnosing Phys: Oswaldo Milian MD PROCEDURE: After discussion of the risks and benefits of a TEE, an informed consent was obtained from the patient. The transesophogeal probe was passed without difficulty through the esophogus of the patient. Sedation performed by different physician. The patient was monitored while under deep sedation. Anesthestetic sedation was provided intravenously by Anesthesiology: 70mg  of Propofol. The patient developed no complications during the procedure. IMPRESSIONS  1. Left ventricular ejection fraction, by estimation, is 55 to 60%. The left ventricle has normal function.  2. Right ventricular systolic function is normal. The right ventricular size is normal. There is moderately elevated pulmonary artery systolic pressure.  3. No left atrial/left atrial appendage thrombus was detected.  4. The mitral valve is normal in structure. Mild mitral valve regurgitation.  5. The aortic valve is tricuspid. Aortic valve regurgitation is not visualized.  6. Evidence of atrial level shunting detected by color flow Doppler. Agitated saline contrast bubble study was positive with shunting observed within 3-6 cardiac cycles suggestive of interatrial shunt. Few bubbles seen in left atrium after patient coughed, suggesting small PFO. Small PFO confirmed visually with left to right shunting.  7. No vegetation seen FINDINGS  Left Ventricle: Left ventricular ejection fraction, by estimation, is 55 to 60%. The left ventricle has normal function. The left ventricular internal cavity size was normal in size. Right Ventricle: The right ventricular size is normal. No increase in right ventricular wall thickness. Right ventricular  systolic function is normal. There is moderately elevated pulmonary artery systolic pressure. The tricuspid regurgitant velocity is 3.35 m/s, and with an assumed right atrial pressure of 3 mmHg, the estimated right ventricular systolic pressure is 06.2 mmHg. Left Atrium: Left atrial size was normal in size. No left atrial/left atrial appendage thrombus was detected. Right Atrium: Right atrial size was normal in size. Pericardium: Trivial pericardial effusion is present. Mitral Valve: The mitral valve is normal in structure. Mild mitral valve regurgitation. Tricuspid Valve: The tricuspid valve is normal in structure. Tricuspid valve regurgitation is mild. Aortic Valve: The aortic valve is tricuspid. Aortic valve regurgitation is not visualized. Aortic valve mean gradient measures 6.0 mmHg. Aortic valve peak gradient measures 10.2 mmHg. Pulmonic Valve: The pulmonic valve was grossly normal. Pulmonic valve regurgitation is not visualized. Aorta: The aortic root is normal in size and structure. IAS/Shunts: Evidence of atrial level shunting detected by color flow Doppler. Agitated saline contrast was given intravenously to evaluate for intracardiac shunting. Agitated saline contrast bubble study was positive with shunting observed within 3-6 cardiac cycles  suggestive of interatrial shunt.  AORTIC VALVE AV Vmax:           160.00 cm/s AV Vmean:          112.000 cm/s AV VTI:            0.244 m AV Peak Grad:      10.2 mmHg AV Mean Grad:      6.0 mmHg LVOT Vmax:         115.00 cm/s LVOT Vmean:        83.500 cm/s LVOT VTI:          0.210 m LVOT/AV VTI ratio: 0.86 TRICUSPID VALVE TR Peak grad:   44.9 mmHg TR Vmax:        335.00 cm/s  SHUNTS Systemic VTI: 0.21 m Oswaldo Milian MD Electronically signed by Oswaldo Milian MD Signature Date/Time: 08/05/2022/4:04:01 PM    Final    CT VENOGRAM HEAD  Result Date: 08/04/2022 CLINICAL DATA:  Dural venous sinus thrombosis suspected. EXAM: CT VENOGRAM HEAD TECHNIQUE:  Venographic phase images of the brain were obtained following the administration of intravenous contrast. Multiplanar reformats and maximum intensity projections were generated. RADIATION DOSE REDUCTION: This exam was performed according to the departmental dose-optimization program which includes automated exposure control, adjustment of the mA and/or kV according to patient size and/or use of iterative reconstruction technique. CONTRAST:  32mL OMNIPAQUE IOHEXOL 350 MG/ML SOLN COMPARISON:  Head and neck CTA 08/04/2022.  Head MRI 08/02/2022. FINDINGS: The recent MRI demonstrated numerous acute infarcts involving both cerebral hemispheres and cerebellum, the majority of which are occult by CT. No acute intracranial hemorrhage, midline shift, or extra-axial fluid collection is identified. The ventricles are normal in size. No fracture or suspicious osseous lesion is identified. There is minimal mucosal thickening in the left sphenoid sinus. The mastoid air cells are clear. The orbits are unremarkable. CT venogram images are motion degraded, however the superior sagittal sinus, internal cerebral veins, vein of Galen, straight sinus, and sigmoid sinuses are enhancing without evidence of thrombosis. The transverse sinuses are small but grossly patent. A small filling defect in the posterior aspect of the superior sagittal sinus is compatible with an arachnoid granulation. IMPRESSION: No evidence of dural venous sinus thrombosis. Electronically Signed   By: Logan Bores M.D.   On: 08/04/2022 16:45   CT ANGIO HEAD NECK W WO CM  Addendum Date: 08/04/2022   ADDENDUM REPORT: 08/04/2022 11:52 ADDENDUM: Findings were discussed with Dr. Eliseo Squires on 08/04/22 at 11:50 AM. Electronically Signed   By: Marin Roberts M.D.   On: 08/04/2022 11:52   Result Date: 08/04/2022 CLINICAL DATA:  Stroke follow-up EXAM: CT ANGIOGRAPHY HEAD AND NECK TECHNIQUE: Multidetector CT imaging of the head and neck was performed using the standard  protocol during bolus administration of intravenous contrast. Multiplanar CT image reconstructions and MIPs were obtained to evaluate the vascular anatomy. Carotid stenosis measurements (when applicable) are obtained utilizing NASCET criteria, using the distal internal carotid diameter as the denominator. RADIATION DOSE REDUCTION: This exam was performed according to the departmental dose-optimization program which includes automated exposure control, adjustment of the mA and/or kV according to patient size and/or use of iterative reconstruction technique. CONTRAST:  71mL OMNIPAQUE IOHEXOL 350 MG/ML SOLN COMPARISON:  MRI Head 08/02/22 FINDINGS: CT HEAD FINDINGS Brain: No evidence of hemorrhage, hydrocephalus, extra-axial collection or mass lesion/mass effect. Extensive infarcts seen on prior MRI are difficult to definitively visualize on this exam, but some of them are seen in the right centrum semiovale, bilateral  occipital lobes, and in the right cerebellum. Vascular: See below Skull: Normal. Negative for fracture or focal lesion. Sinuses/Orbits: No acute finding. Other: None. Review of the MIP images confirms the above findings CTA NECK FINDINGS Aortic arch: Standard branching. Imaged portion shows no evidence of aneurysm or dissection. No significant stenosis of the major arch vessel origins. Right carotid system: No evidence of dissection, stenosis (50% or greater), or occlusion. Left carotid system: No evidence of dissection, stenosis (50% or greater), or occlusion. Vertebral arteries: Codominant. No evidence of dissection, stenosis (50% or greater), or occlusion. Skeleton: Negative. Other neck: Asymmetrically enlarged right level 5 lymph node measuring up to 6 mm (series 9, image 122). Additional enlarged right level 5 lymph node is present more superiorly measuring 9 mm (series 9, image 106). Enlarged 7 mm left level 5 lymph node is also present (series 9, image 115). 9 mm right level 2A lymph node (series  9, image 94). There is soft tissue stranding along the posterior right suboccipital neck (series 9, image 87), nonspecific. Upper chest: Small bilateral pleural effusions. There is a 1.2 cm right upper lobe pulmonary nodule, better assessed on prior CT chest dated 08/04/2019. There also multiple enlarged mediastinal lymph nodes and left axillary lymph node. Review of the MIP images confirms the above findings CTA HEAD FINDINGS Anterior circulation: No significant stenosis, proximal occlusion, aneurysm, or vascular malformation. Posterior circulation: No significant stenosis, proximal occlusion, aneurysm, or vascular malformation. Venous sinuses: There is a congenitally small left transverse and sigmoid sinus. There is decreased opacification of the right-sided transverse and sigmoid sinus (series 11, image 122). This area of decreased opacification extends proximally to involve the superior sagittal sinus hazy month Anatomic variants: None Review of the MIP images confirms the above findings IMPRESSION: 1. Asymmetrically decreased opacification of the right-sided transverse and sigmoid sinus is worrisome for dural venous sinus thrombosis. Recommend further evaluation with a CT venogram. 2. No intracranial LVO or significant arterial stenosis in the neck. 3. No acute intracranial process. 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 4. Multiple enlarged lymph nodes in the neck, mediastinum, and left axilla, which are compatible with clinically suspected lymphoma. 5. Partially imaged pleural effusions and right sided pulmonary nodule, better seen on prior CTA chest. Electronically Signed: By: Marin Roberts M.D. On: 08/04/2022 11:17   VAS [place] LOWER EXTREMITY VENOUS (DVT)  Result Date: 08/03/2022  Lower Venous DVT Study Patient Name:  YARIANA HOAGLUND  Date of  Exam:   08/03/2022 Medical Rec #: [id number]      Accession #:    [id number] Date of Birth: Mar 11, 1956      Patient Gender: F Patient Age:   29 years Exam Location:  [facility] Procedure:      VAS [place] LOWER EXTREMITY VENOUS (DVT) Referring Phys: TIMOTHY OPYD --------------------------------------------------------------------------------  Indications: Elevated d-dimer, lymphoma.  Comparison       06-15-2021 Prior left lower extremity venous was negative for Study:           DVT. Performing Technologist: Darlin Coco RDMS, RVT  Examination Guidelines: A complete evaluation includes B-mode imaging, spectral Doppler, color Doppler, and power Doppler as needed of all accessible portions of each vessel. Bilateral testing is considered an integral part of a complete examination. Limited examinations for reoccurring indications may be performed as noted. The reflux portion of the  exam is performed with the patient in reverse Trendelenburg.  +---------+---------------+---------+-----------+----------+--------------+ RIGHT    CompressibilityPhasicitySpontaneityPropertiesThrombus Aging +---------+---------------+---------+-----------+----------+--------------+ CFV      Full           Yes      Yes                                 +---------+---------------+---------+-----------+----------+--------------+ SFJ      Full                                                        +---------+---------------+---------+-----------+----------+--------------+ FV Prox  Full                                                        +---------+---------------+---------+-----------+----------+--------------+ FV Mid   Full                                                        +---------+---------------+---------+-----------+----------+--------------+ FV DistalFull                                                        +---------+---------------+---------+-----------+----------+--------------+ PFV       Full                                                        +---------+---------------+---------+-----------+----------+--------------+ POP      Full           Yes      Yes                                 +---------+---------------+---------+-----------+----------+--------------+ PTV      Full                                                        +---------+---------------+---------+-----------+----------+--------------+ PERO     Full                                                        +---------+---------------+---------+-----------+----------+--------------+ Gastroc  Full                                                        +---------+---------------+---------+-----------+----------+--------------+   +---------+---------------+---------+-----------+----------+--------------+  LEFT     CompressibilityPhasicitySpontaneityPropertiesThrombus Aging +---------+---------------+---------+-----------+----------+--------------+ CFV      Full           Yes      Yes                                 +---------+---------------+---------+-----------+----------+--------------+ SFJ      Full                                                        +---------+---------------+---------+-----------+----------+--------------+ FV Prox  Full                                                        +---------+---------------+---------+-----------+----------+--------------+ FV Mid   Full                                                        +---------+---------------+---------+-----------+----------+--------------+ FV DistalFull                                                        +---------+---------------+---------+-----------+----------+--------------+ PFV      Full                                                        +---------+---------------+---------+-----------+----------+--------------+ POP      Full           Yes      Yes                                  +---------+---------------+---------+-----------+----------+--------------+ PTV      Full                                                        +---------+---------------+---------+-----------+----------+--------------+ PERO     Partial        No       No                   Acute          +---------+---------------+---------+-----------+----------+--------------+ Gastroc  Full                                                        +---------+---------------+---------+-----------+----------+--------------+  Summary: RIGHT: - There is no evidence of deep vein thrombosis in the lower extremity.  - No cystic structure found in the popliteal fossa.  - Ultrasound characteristics of enlarged lymph nodes are noted in the groin.  LEFT: - Findings consistent with acute deep vein thrombosis involving the left peroneal veins.  - No cystic structure found in the popliteal fossa.  - Ultrasound characteristics of enlarged lymph nodes noted in the groin.  *See table(s) above for measurements and observations. Electronically signed by Harold Barban MD on 08/03/2022 at 9:30:05 PM.    Final    ECHOCARDIOGRAM COMPLETE  Result Date: 08/03/2022    ECHOCARDIOGRAM REPORT   Patient Name:   EVONNE RINKS Date of Exam: 08/03/2022 Medical Rec #:  [id number]     Height:       63.0 in Accession #:    [id number]    Weight:       160.0 lb Date of Birth:  Aug 08, 1956     BSA:          1.759 m Patient Age:    56 years      BP:           152/94 mmHg Patient Gender: F             HR:           110 bpm. Exam Location:  Inpatient Procedure: 2D Echo Indications:    stroke  History:        Patient has prior history of Echocardiogram examinations and                 Patient has no prior history of Echocardiogram examinations.                 Sepsis; Risk Factors:Hypertension.  Sonographer:    Johny Chess RDCS Referring Phys: [id number] RIPUDEEP K RAI  Sonographer Comments: Image acquisition challenging due to  uncooperative patient. IMPRESSIONS  1. Left ventricular ejection fraction, by estimation, is 60 to 65%. The left ventricle has normal function. The left ventricle has no regional wall motion abnormalities. There is mild concentric left ventricular hypertrophy. Left ventricular diastolic parameters are consistent with Grade I diastolic dysfunction (impaired relaxation).  2. Right ventricular systolic function is normal. The right ventricular size is normal. Tricuspid regurgitation signal is inadequate for assessing PA pressure.  3. The mitral valve is normal in structure. No evidence of mitral valve regurgitation. No evidence of mitral stenosis.  4. The aortic valve is tricuspid. There is mild calcification of the aortic valve. Aortic valve regurgitation is not visualized. No aortic stenosis is present.  5. The inferior vena cava is normal in size with greater than 50% respiratory variability, suggesting right atrial pressure of 3 mmHg. FINDINGS  Left Ventricle: Left ventricular ejection fraction, by estimation, is 60 to 65%. The left ventricle has normal function. The left ventricle has no regional wall motion abnormalities. The left ventricular internal cavity size was normal in size. There is  mild concentric left ventricular hypertrophy. Left ventricular diastolic parameters are consistent with Grade I diastolic dysfunction (impaired relaxation). Right Ventricle: The right ventricular size is normal. No increase in right ventricular wall thickness. Right ventricular systolic function is normal. Tricuspid regurgitation signal is inadequate for assessing PA pressure. Left Atrium: Left atrial size was normal in size. Right Atrium: Right atrial size was normal in size. Pericardium: There is no evidence of pericardial effusion. Mitral Valve: The mitral valve is normal in structure. There is mild  calcification of the mitral valve leaflet(s). Mild mitral annular calcification. No evidence of mitral valve regurgitation.  No evidence of mitral valve stenosis. Tricuspid Valve: The tricuspid valve is normal in structure. Tricuspid valve regurgitation is not demonstrated. Aortic Valve: The aortic valve is tricuspid. There is mild calcification of the aortic valve. Aortic valve regurgitation is not visualized. No aortic stenosis is present. Pulmonic Valve: The pulmonic valve was normal in structure. Pulmonic valve regurgitation is not visualized. Aorta: The aortic root is normal in size and structure. Venous: The inferior vena cava is normal in size with greater than 50% respiratory variability, suggesting right atrial pressure of 3 mmHg. IAS/Shunts: No atrial level shunt detected by color flow Doppler.  LEFT VENTRICLE PLAX 2D LVIDd:         4.20 cm LVIDs:         2.40 cm LV PW:         0.90 cm LV IVS:        1.20 cm LVOT diam:     1.60 cm LV SV:         48 LV SV Index:   27 LVOT Area:     2.01 cm  RIGHT VENTRICLE             IVC RV S prime:     20.30 cm/s  IVC diam: 1.30 cm TAPSE (M-mode): 1.6 cm LEFT ATRIUM             Index        RIGHT ATRIUM          Index LA diam:        3.40 cm 1.93 cm/m   RA Area:     9.64 cm LA Vol (A2C):   26.3 ml 14.95 ml/m  RA Volume:   19.10 ml 10.86 ml/m LA Vol (A4C):   27.5 ml 15.64 ml/m LA Biplane Vol: 27.9 ml 15.86 ml/m  AORTIC VALVE LVOT Vmax:   152.00 cm/s LVOT Vmean:  105.000 cm/s LVOT VTI:    0.238 m  AORTA Ao Root diam: 3.00 cm MV E velocity: 79.60 cm/s MV A velocity: 141.00 cm/s  SHUNTS MV E/A ratio:  0.56         Systemic VTI:  0.24 m                             Systemic Diam: 1.60 cm Dalton McleanMD Electronically signed by Franki Monte Signature Date/Time: 08/03/2022/2:41:22 PM    Final    CT Angio Chest Pulmonary Embolism (PE) W or WO Contrast  Result Date: 08/03/2022 CLINICAL DATA:  Pulmonary embolism suspected, positive D-dimer. Undergoing malignancy workup with recent biopsy suggesting lymphoma. EXAM: CT ANGIOGRAPHY CHEST WITH CONTRAST TECHNIQUE: Multidetector CT imaging of  the chest was performed using the standard protocol during bolus administration of intravenous contrast. Multiplanar CT image reconstructions and MIPs were obtained to evaluate the vascular anatomy. RADIATION DOSE REDUCTION: This exam was performed according to the departmental dose-optimization program which includes automated exposure control, adjustment of the mA and/or kV according to patient size and/or use of iterative reconstruction technique. CONTRAST:  32mL OMNIPAQUE IOHEXOL 350 MG/ML SOLN COMPARISON:  None Available. FINDINGS: Cardiovascular: The heart is borderline enlarged. There is no pericardial effusion. Scattered coronary artery calcifications are noted. There is atherosclerotic calcification of the aorta without evidence of aneurysm. Pulmonary trunk is normal in caliber. No pulmonary artery filling defect is seen. Examination is limited due to respiratory  motion artifact and pulmonary nodules. Mediastinum/Nodes: Multiple enlarged lymph nodes are present in the mediastinum and hilar regions bilaterally. No axillary lymphadenopathy. Thyroid gland, trachea, and esophagus are within normal limits. Lungs/Pleura: Hazy regional ground-glass opacities are present in the lungs bilaterally. There are multiple scattered pulmonary nodules bilaterally, the largest in the right lower lobe measuring 3.7 cm, axial image 110 and in the left lower lobe measuring 4.1 cm, axial image 94. No effusion or pneumothorax. Upper Abdomen: Enlarged lymph nodes are present in the gastrohepatic ligament measuring up to 1.1 cm. No acute abnormality. Musculoskeletal: No acute or suspicious osseous abnormality. Review of the MIP images confirms the above findings. IMPRESSION: 1. No evidence of pulmonary embolism. 2. Multiple enlarged mediastinal and hilar lymph nodes and pulmonary nodules bilaterally measuring up to 4.1 cm, concerning for neoplastic process or lymphoma. Correlation with recent biopsy results is recommended. 3. Mild  hazy geographic ground-glass opacities in the lungs, possible edema or air trapping. 4. Coronary artery calcifications. 5. Aortic atherosclerosis. Electronically Signed   By: Brett Fairy M.D.   On: 08/03/2022 01:10   MR BRAIN WO CONTRAST  Result Date: 08/02/2022 CLINICAL DATA:  Altered mental status EXAM: MRI HEAD WITHOUT CONTRAST TECHNIQUE: Multiplanar, multiecho pulse sequences of the brain and surrounding structures were obtained without intravenous contrast. COMPARISON:  04/15/2013 FINDINGS: Brain: Numerous, diffuse bilateral foci of abnormal diffusion restriction in a pattern that is most suggestive of embolic shower but could also indicate watershed ischemia. No acute hemorrhage. There is multifocal hyperintense T2-weighted signal within the white matter. Generalized volume loss. The midline structures are normal. Vascular: Major flow voids are preserved. Skull and upper cervical spine: Normal calvarium and skull base. Visualized upper cervical spine and soft tissues are normal. Sinuses/Orbits:No paranasal sinus fluid levels or advanced mucosal thickening. No mastoid or middle ear effusion. Normal orbits. IMPRESSION: 1. Numerous, diffuse bilateral foci of abnormal diffusion restriction in a pattern that is most suggestive of embolic shower but could also indicate watershed ischemia. 2. No acute hemorrhage or mass effect. Electronically Signed   By: Ulyses Jarred M.D.   On: 08/02/2022 23:57   CT Head Wo Contrast  Result Date: 08/02/2022 CLINICAL DATA:  Altered mental status, recent diagnosis of lymphoma EXAM: CT HEAD WITHOUT CONTRAST TECHNIQUE: Contiguous axial images were obtained from the base of the skull through the vertex without intravenous contrast. RADIATION DOSE REDUCTION: This exam was performed according to the departmental dose-optimization program which includes automated exposure control, adjustment of the mA and/or kV according to patient size and/or use of iterative reconstruction  technique. COMPARISON:  MR brain done on 04/15/2013 FINDINGS: Brain: No acute intracranial findings are seen in noncontrast CT brain. There are no signs of bleeding within the cranium. Ventricles are not dilated. Cortical sulci are prominent. There is no focal edema or mass effect. Vascular: Unremarkable Skull: Unremarkable. Sinuses/Orbits: Unremarkable. Other: None. IMPRESSION: No acute intracranial findings are seen in noncontrast CT brain. Atrophy. Electronically Signed   By: Elmer Picker M.D.   On: 08/02/2022 16:35   DG Chest 2 View  Result Date: 08/02/2022 CLINICAL DATA:  Cough, altered mental status x3 days EXAM: CHEST - 2 VIEW COMPARISON:  04/21/2021 FINDINGS: Cardiac size is within normal limits. Thoracic aorta is tortuous. There are nodular densities of varying sizes scattered throughout both lungs largest overlying the inferior right hilum measuring 2.8 cm. Findings suggest possible metastatic disease. Less likely possibility would be nodular appearing multifocal pneumonia. There is no pleural effusion or pneumothorax. Degenerative changes are  noted in both shoulders. IMPRESSION: There are multiple nodular densities of varying sizes in both lungs suggesting pulmonary metastatic disease. Follow-up CT, PET-CT and biopsy as warranted should be considered. Please correlate with clinical history for any known primary malignancy. Electronically Signed   By: Elmer Picker M.D.   On: 08/02/2022 15:37    Microbiology: Results for orders placed or performed during the hospital encounter of 08/02/22  Resp Panel by RT-PCR (Flu A&B, Covid) Anterior Nasal Swab     Status: None   Collection Time: 08/02/22  3:10 PM   Specimen: Anterior Nasal Swab  Result Value Ref Range Status   SARS Coronavirus 2 by RT PCR NEGATIVE NEGATIVE Final    Comment: (NOTE) SARS-CoV-2 target nucleic acids are NOT DETECTED.  The SARS-CoV-2 RNA is generally detectable in upper respiratory specimens during the acute  phase of infection. The lowest concentration of SARS-CoV-2 viral copies this assay can detect is 138 copies/mL. A negative result does not preclude SARS-Cov-2 infection and should not be used as the sole basis for treatment or other patient management decisions. A negative result may occur with  improper specimen collection/handling, submission of specimen other than nasopharyngeal swab, presence of viral mutation(s) within the areas targeted by this assay, and inadequate number of viral copies(<138 copies/mL). A negative result must be combined with clinical observations, patient history, and epidemiological information. The expected result is Negative.  Fact Sheet for Patients:  EntrepreneurPulse.com.au  Fact Sheet for Healthcare Providers:  IncredibleEmployment.be  This test is no t yet approved or cleared by the [place] FDA and  has been authorized for detection and/or diagnosis of SARS-CoV-2 by FDA under an Emergency Use Authorization (EUA). This EUA will remain  in effect (meaning this test can be used) for the duration of the COVID-19 declaration under Section 564(b)(1) of the Act, 21 U.S.C.section 360bbb-3(b)(1), unless the authorization is terminated  or revoked sooner.       Influenza A by PCR NEGATIVE NEGATIVE Final   Influenza B by PCR NEGATIVE NEGATIVE Final    Comment: (NOTE) The Xpert Xpress SARS-CoV-2/FLU/RSV plus assay is intended as an aid in the diagnosis of influenza from Nasopharyngeal swab specimens and should not be used as a sole basis for treatment. Nasal washings and aspirates are unacceptable for Xpert Xpress SARS-CoV-2/FLU/RSV testing.  Fact Sheet for Patients: EntrepreneurPulse.com.au  Fact Sheet for Healthcare Providers: IncredibleEmployment.be  This test is not yet approved or cleared by the [place] FDA and has been authorized for detection and/or diagnosis of  SARS-CoV-2 by FDA under an Emergency Use Authorization (EUA). This EUA will remain in effect (meaning this test can be used) for the duration of the COVID-19 declaration under Section 564(b)(1) of the Act, 21 U.S.C. section 360bbb-3(b)(1), unless the authorization is terminated or revoked.  Performed at [facility], [place] [address] [place], [place] [address]   Blood culture (routine x 2)     Status: None   Collection Time: 08/02/22  3:16 PM   Specimen: BLOOD  Result Value Ref Range Status   Specimen Description BLOOD LEFT ANTECUBITAL  Final   Special Requests   Final    BOTTLES DRAWN AEROBIC AND ANAEROBIC Blood Culture results may not be optimal due to an inadequate volume of blood received in culture bottles   Culture   Final    NO GROWTH 5 DAYS Performed at [facility], [place] [address], [place], [place] [address]    Report Status 08/07/2022 FINAL  Final  Blood culture (routine x 2)     Status: None   Collection Time: 08/02/22  7:02 PM   Specimen: BLOOD  Result Value Ref Range Status   Specimen Description BLOOD LEFT ANTECUBITAL  Final   Special Requests   Final    BOTTLES DRAWN AEROBIC AND ANAEROBIC Blood Culture adequate volume   Culture   Final    NO GROWTH 5 DAYS Performed at [facility], [address], [place], [place] [address]    Report Status 08/07/2022 FINAL  Final  Urine Culture     Status: Abnormal   Collection Time: 08/02/22  9:37 PM   Specimen: Urine, Clean Catch  Result Value Ref Range Status   Specimen Description URINE, CLEAN CATCH  Final   Special Requests   Final    NONE Performed at [facility], [place] [address] [place], [place] [address]    Culture 10,000 COLONIES/mL ESCHERICHIA COLI (A)  Final   Report Status 08/05/2022 FINAL  Final   Organism ID, Bacteria ESCHERICHIA COLI (A)  Final      Susceptibility   Escherichia coli - MIC*    AMPICILLIN 8 SENSITIVE Sensitive     CEFAZOLIN <=4 SENSITIVE Sensitive     CEFEPIME  <=0.12 SENSITIVE Sensitive     CEFTRIAXONE <=0.25 SENSITIVE Sensitive     CIPROFLOXACIN <=0.25 SENSITIVE Sensitive     GENTAMICIN <=1 SENSITIVE Sensitive     IMIPENEM <=0.25 SENSITIVE Sensitive     NITROFURANTOIN <=16 SENSITIVE Sensitive     TRIMETH/SULFA <=20 SENSITIVE Sensitive     AMPICILLIN/SULBACTAM 4 SENSITIVE Sensitive     PIP/TAZO <=4 SENSITIVE Sensitive     * 10,000 COLONIES/mL ESCHERICHIA COLI    Labs: CBC: Recent Labs  Lab 08/12/22 0200 08/13/22 0434 08/14/22 0533 08/15/22 0537  WBC 46.7* 48.4* 71.2* 67.0*  NEUTROABS 19.1* 12.2* 7.7 9.0*  HGB 12.1 11.8* 12.6 12.1  HCT 36.4 35.3* 36.4 35.6*  MCV 96.0 96.2 96.8 98.3  PLT 308 341 379 500   Basic Metabolic Panel: Recent Labs  Lab 08/12/22 0200 08/12/22 1640 08/13/22 0434 08/13/22 1749 08/14/22 0533 08/14/22 1752 08/15/22 0537  NA 138  --  138  --  140  --  138  K 4.2   < > 4.1 3.6 3.7 3.8 3.7  CL 100  --  96*  --  98  --  98  CO2 29  --  28  --  30  --  29  GLUCOSE 171*  --  123*  --  118*  --  132*  BUN 18  --  26*  --  27*  --  23  CREATININE 0.68  --  0.73  --  0.70  --  0.63  CALCIUM 8.4*  --  8.6*  --  8.5*  --  8.6*  MG 2.4   < > 2.4 2.2 2.3 2.3 2.3  PHOS 2.8  --   --   --  2.8  --  3.0   < > = values in this interval not displayed.   Liver Function Tests: Recent Labs  Lab 08/12/22 0200 08/14/22 0533 08/15/22 0537  AST 17 16 14*  ALT $Re'12 15 15  'XFs$ ALKPHOS 66 57 60  BILITOT 0.6 0.5 0.4  PROT 5.8* 6.1* 6.3*  ALBUMIN 2.2* 2.3* 2.3*   CBG: Recent Labs  Lab 08/14/22 2051 08/15/22 0123 08/15/22 0415 08/15/22 0809 08/15/22 1216  GLUCAP 222* 139* 123* 189* 170*    Discharge time spent:  greater than 30 minutes.  Signed: Patrecia Pour, MD [facility] 08/18/2022

## 2022-08-18 NOTE — Progress Notes (Signed)
[facility]) Hospital Liaison Note  Received request from Jacqlyn Krauss, [facility] for family interest in [facility]. Spoke with patient's daughter, [name], to confirm interest and explain services.   Eligibility confirmed per [facility] MD. [name] is agreeable to transfer today.   TOC aware.  RN, please call report to [phone] prior to patient leaving the unit.  Please send signed and completed DNR with patient at discharge.   Thank you,  Zigmund Gottron RN  [facility] Liaison [phone]

## 2022-08-18 NOTE — Plan of Care (Signed)

## 2022-08-24 DEATH — deceased

## 2022-11-01 ENCOUNTER — Telehealth: Payer: Self-pay | Admitting: Internal Medicine

## 2022-11-01 NOTE — Telephone Encounter (Signed)
Caller states: - Patient passed away on September 08, 2022  - A provider's statement had been faxed to PCP office on 10/11/22 but no answer   Caller requests: - Healthcare provider statement be completed and faxed back to them  Call back number is [phone] and claim number is [id number]
